# Patient Record
Sex: Female | Born: 1985 | Hispanic: No | Marital: Married | Smoking: Former smoker
Health system: Southern US, Community
[De-identification: ages and names within clinical notes are randomized; demographics above are authoritative.]

## PROBLEM LIST (undated history)

## (undated) DIAGNOSIS — R51 Headache: Secondary | ICD-10-CM

## (undated) DIAGNOSIS — F329 Major depressive disorder, single episode, unspecified: Secondary | ICD-10-CM

## (undated) DIAGNOSIS — F419 Anxiety disorder, unspecified: Secondary | ICD-10-CM

## (undated) DIAGNOSIS — F319 Bipolar disorder, unspecified: Secondary | ICD-10-CM

## (undated) DIAGNOSIS — F1910 Other psychoactive substance abuse, uncomplicated: Secondary | ICD-10-CM

## (undated) DIAGNOSIS — R519 Headache, unspecified: Secondary | ICD-10-CM

## (undated) DIAGNOSIS — Z9889 Other specified postprocedural states: Secondary | ICD-10-CM

## (undated) DIAGNOSIS — F1120 Opioid dependence, uncomplicated: Secondary | ICD-10-CM

## (undated) DIAGNOSIS — J189 Pneumonia, unspecified organism: Secondary | ICD-10-CM

## (undated) DIAGNOSIS — J45909 Unspecified asthma, uncomplicated: Secondary | ICD-10-CM

## (undated) DIAGNOSIS — R112 Nausea with vomiting, unspecified: Secondary | ICD-10-CM

## (undated) DIAGNOSIS — D649 Anemia, unspecified: Secondary | ICD-10-CM

## (undated) DIAGNOSIS — F32A Depression, unspecified: Secondary | ICD-10-CM

## (undated) DIAGNOSIS — Z8489 Family history of other specified conditions: Secondary | ICD-10-CM

## (undated) DIAGNOSIS — I10 Essential (primary) hypertension: Secondary | ICD-10-CM

## (undated) DIAGNOSIS — R87619 Unspecified abnormal cytological findings in specimens from cervix uteri: Secondary | ICD-10-CM

## (undated) DIAGNOSIS — IMO0002 Reserved for concepts with insufficient information to code with codable children: Secondary | ICD-10-CM

## (undated) DIAGNOSIS — A749 Chlamydial infection, unspecified: Secondary | ICD-10-CM

## (undated) HISTORY — DX: Chlamydial infection, unspecified: A74.9

## (undated) HISTORY — DX: Reserved for concepts with insufficient information to code with codable children: IMO0002

## (undated) HISTORY — DX: Headache, unspecified: R51.9

## (undated) HISTORY — DX: Major depressive disorder, single episode, unspecified: F32.9

## (undated) HISTORY — DX: Opioid dependence, uncomplicated: F11.20

## (undated) HISTORY — DX: Depression, unspecified: F32.A

## (undated) HISTORY — DX: Bipolar disorder, unspecified: F31.9

## (undated) HISTORY — PX: OTHER SURGICAL HISTORY: SHX169

## (undated) HISTORY — DX: Unspecified abnormal cytological findings in specimens from cervix uteri: R87.619

## (undated) HISTORY — DX: Headache: R51

---
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
## 2005-05-12 ENCOUNTER — Ambulatory Visit: Payer: Self-pay | Admitting: Family Medicine

## 2005-06-12 ENCOUNTER — Ambulatory Visit: Payer: Self-pay | Admitting: Family Medicine

## 2005-06-17 ENCOUNTER — Encounter (INDEPENDENT_AMBULATORY_CARE_PROVIDER_SITE_OTHER): Payer: Self-pay | Admitting: *Deleted

## 2005-06-17 LAB — CONVERTED CEMR LAB

## 2005-07-13 ENCOUNTER — Ambulatory Visit: Payer: Self-pay | Admitting: Family Medicine

## 2005-07-13 ENCOUNTER — Other Ambulatory Visit: Admission: RE | Admit: 2005-07-13 | Discharge: 2005-07-13 | Payer: Self-pay | Admitting: Family Medicine

## 2005-08-14 ENCOUNTER — Ambulatory Visit: Payer: Self-pay | Admitting: Sports Medicine

## 2005-09-07 ENCOUNTER — Ambulatory Visit: Payer: Self-pay | Admitting: Family Medicine

## 2005-10-31 ENCOUNTER — Ambulatory Visit: Payer: Self-pay | Admitting: Family Medicine

## 2005-11-23 ENCOUNTER — Emergency Department (HOSPITAL_COMMUNITY): Admission: EM | Admit: 2005-11-23 | Discharge: 2005-11-23 | Payer: Self-pay | Admitting: Family Medicine

## 2005-12-24 ENCOUNTER — Emergency Department (HOSPITAL_COMMUNITY): Admission: AD | Admit: 2005-12-24 | Discharge: 2005-12-24 | Payer: Self-pay | Admitting: Emergency Medicine

## 2005-12-28 ENCOUNTER — Ambulatory Visit: Payer: Self-pay | Admitting: Family Medicine

## 2006-01-09 ENCOUNTER — Other Ambulatory Visit: Admission: RE | Admit: 2006-01-09 | Discharge: 2006-01-09 | Payer: Self-pay | Admitting: Family Medicine

## 2006-01-09 ENCOUNTER — Ambulatory Visit: Payer: Self-pay | Admitting: Family Medicine

## 2006-01-09 ENCOUNTER — Encounter (INDEPENDENT_AMBULATORY_CARE_PROVIDER_SITE_OTHER): Payer: Self-pay | Admitting: Specialist

## 2006-01-10 ENCOUNTER — Ambulatory Visit (HOSPITAL_COMMUNITY): Admission: RE | Admit: 2006-01-10 | Discharge: 2006-01-10 | Payer: Self-pay | Admitting: Sports Medicine

## 2006-02-06 ENCOUNTER — Inpatient Hospital Stay (HOSPITAL_COMMUNITY): Admission: AD | Admit: 2006-02-06 | Discharge: 2006-02-07 | Payer: Self-pay | Admitting: Family Medicine

## 2006-02-13 ENCOUNTER — Ambulatory Visit: Payer: Self-pay | Admitting: Family Medicine

## 2006-03-16 ENCOUNTER — Ambulatory Visit: Payer: Self-pay | Admitting: Family Medicine

## 2006-03-29 ENCOUNTER — Ambulatory Visit (HOSPITAL_COMMUNITY): Admission: RE | Admit: 2006-03-29 | Discharge: 2006-03-29 | Payer: Self-pay | Admitting: Family Medicine

## 2006-04-11 ENCOUNTER — Ambulatory Visit: Payer: Self-pay | Admitting: Family Medicine

## 2006-05-17 ENCOUNTER — Ambulatory Visit: Payer: Self-pay | Admitting: Family Medicine

## 2006-06-21 ENCOUNTER — Emergency Department (HOSPITAL_COMMUNITY): Admission: EM | Admit: 2006-06-21 | Discharge: 2006-06-21 | Payer: Self-pay | Admitting: Emergency Medicine

## 2006-06-22 ENCOUNTER — Ambulatory Visit: Payer: Self-pay | Admitting: Family Medicine

## 2006-06-23 ENCOUNTER — Emergency Department (HOSPITAL_COMMUNITY): Admission: EM | Admit: 2006-06-23 | Discharge: 2006-06-23 | Payer: Self-pay | Admitting: Emergency Medicine

## 2006-07-12 ENCOUNTER — Ambulatory Visit: Payer: Self-pay | Admitting: Sports Medicine

## 2006-07-23 ENCOUNTER — Ambulatory Visit: Payer: Self-pay | Admitting: Family Medicine

## 2006-07-30 ENCOUNTER — Ambulatory Visit: Payer: Self-pay | Admitting: Family Medicine

## 2006-08-09 ENCOUNTER — Ambulatory Visit: Payer: Self-pay | Admitting: Family Medicine

## 2006-08-10 ENCOUNTER — Ambulatory Visit: Payer: Self-pay | Admitting: Obstetrics and Gynecology

## 2006-08-10 ENCOUNTER — Inpatient Hospital Stay (HOSPITAL_COMMUNITY): Admission: AD | Admit: 2006-08-10 | Discharge: 2006-08-12 | Payer: Self-pay | Admitting: *Deleted

## 2006-09-12 ENCOUNTER — Ambulatory Visit: Payer: Self-pay | Admitting: Family Medicine

## 2006-10-03 ENCOUNTER — Ambulatory Visit: Payer: Self-pay | Admitting: Family Medicine

## 2006-10-19 ENCOUNTER — Encounter (INDEPENDENT_AMBULATORY_CARE_PROVIDER_SITE_OTHER): Payer: Self-pay | Admitting: Specialist

## 2006-10-19 ENCOUNTER — Other Ambulatory Visit: Admission: RE | Admit: 2006-10-19 | Discharge: 2006-10-19 | Payer: Self-pay | Admitting: Family Medicine

## 2006-10-19 ENCOUNTER — Ambulatory Visit: Payer: Self-pay | Admitting: Family Medicine

## 2006-12-20 ENCOUNTER — Emergency Department (HOSPITAL_COMMUNITY): Admission: EM | Admit: 2006-12-20 | Discharge: 2006-12-20 | Payer: Self-pay | Admitting: Emergency Medicine

## 2007-01-30 ENCOUNTER — Emergency Department (HOSPITAL_COMMUNITY): Admission: EM | Admit: 2007-01-30 | Discharge: 2007-01-30 | Payer: Self-pay | Admitting: Emergency Medicine

## 2007-02-11 ENCOUNTER — Encounter (INDEPENDENT_AMBULATORY_CARE_PROVIDER_SITE_OTHER): Payer: Self-pay | Admitting: Family Medicine

## 2007-02-11 ENCOUNTER — Ambulatory Visit: Payer: Self-pay | Admitting: Family Medicine

## 2007-02-11 LAB — CONVERTED CEMR LAB
Chlamydia, DNA Probe: NEGATIVE
GC Probe Amp, Genital: NEGATIVE

## 2007-02-14 DIAGNOSIS — F17200 Nicotine dependence, unspecified, uncomplicated: Secondary | ICD-10-CM

## 2007-02-14 DIAGNOSIS — J309 Allergic rhinitis, unspecified: Secondary | ICD-10-CM | POA: Insufficient documentation

## 2007-02-14 DIAGNOSIS — L2089 Other atopic dermatitis: Secondary | ICD-10-CM

## 2007-02-14 DIAGNOSIS — Z87891 Personal history of nicotine dependence: Secondary | ICD-10-CM | POA: Insufficient documentation

## 2007-02-14 DIAGNOSIS — R8789 Other abnormal findings in specimens from female genital organs: Secondary | ICD-10-CM

## 2007-02-14 DIAGNOSIS — G43909 Migraine, unspecified, not intractable, without status migrainosus: Secondary | ICD-10-CM | POA: Insufficient documentation

## 2007-02-15 ENCOUNTER — Encounter (INDEPENDENT_AMBULATORY_CARE_PROVIDER_SITE_OTHER): Payer: Self-pay | Admitting: *Deleted

## 2007-03-24 ENCOUNTER — Emergency Department (HOSPITAL_COMMUNITY): Admission: EM | Admit: 2007-03-24 | Discharge: 2007-03-24 | Payer: Self-pay | Admitting: Emergency Medicine

## 2007-06-13 ENCOUNTER — Telehealth: Payer: Self-pay | Admitting: *Deleted

## 2007-06-13 ENCOUNTER — Ambulatory Visit: Payer: Self-pay | Admitting: Sports Medicine

## 2007-06-13 LAB — CONVERTED CEMR LAB
Nitrite: NEGATIVE
Urobilinogen, UA: 4
pH: 7

## 2007-06-19 ENCOUNTER — Encounter: Payer: Self-pay | Admitting: *Deleted

## 2007-06-19 ENCOUNTER — Emergency Department (HOSPITAL_COMMUNITY): Admission: EM | Admit: 2007-06-19 | Discharge: 2007-06-19 | Payer: Self-pay | Admitting: Family Medicine

## 2007-08-12 ENCOUNTER — Telehealth: Payer: Self-pay | Admitting: *Deleted

## 2007-08-16 ENCOUNTER — Encounter: Payer: Self-pay | Admitting: *Deleted

## 2007-09-18 LAB — CONVERTED CEMR LAB: Pap Smear: NORMAL

## 2007-10-06 ENCOUNTER — Telehealth (INDEPENDENT_AMBULATORY_CARE_PROVIDER_SITE_OTHER): Payer: Self-pay | Admitting: Family Medicine

## 2007-10-10 ENCOUNTER — Emergency Department (HOSPITAL_COMMUNITY): Admission: EM | Admit: 2007-10-10 | Discharge: 2007-10-10 | Payer: Self-pay | Admitting: Emergency Medicine

## 2007-10-11 ENCOUNTER — Telehealth: Payer: Self-pay | Admitting: *Deleted

## 2007-10-15 ENCOUNTER — Telehealth (INDEPENDENT_AMBULATORY_CARE_PROVIDER_SITE_OTHER): Payer: Self-pay | Admitting: Family Medicine

## 2007-10-15 ENCOUNTER — Other Ambulatory Visit: Admission: RE | Admit: 2007-10-15 | Discharge: 2007-10-15 | Payer: Self-pay | Admitting: Family Medicine

## 2007-10-15 ENCOUNTER — Encounter (INDEPENDENT_AMBULATORY_CARE_PROVIDER_SITE_OTHER): Payer: Self-pay | Admitting: Family Medicine

## 2007-10-15 ENCOUNTER — Ambulatory Visit: Payer: Self-pay | Admitting: Family Medicine

## 2007-10-15 LAB — CONVERTED CEMR LAB: Whiff Test: NEGATIVE

## 2007-10-16 LAB — CONVERTED CEMR LAB: GC Probe Amp, Genital: NEGATIVE

## 2007-10-18 ENCOUNTER — Encounter (INDEPENDENT_AMBULATORY_CARE_PROVIDER_SITE_OTHER): Payer: Self-pay | Admitting: Family Medicine

## 2007-10-27 ENCOUNTER — Emergency Department (HOSPITAL_COMMUNITY): Admission: EM | Admit: 2007-10-27 | Discharge: 2007-10-28 | Payer: Self-pay | Admitting: Emergency Medicine

## 2007-10-30 ENCOUNTER — Ambulatory Visit: Payer: Self-pay | Admitting: Family Medicine

## 2007-10-30 LAB — CONVERTED CEMR LAB
Glucose, Urine, Semiquant: NEGATIVE
pH: 7

## 2007-12-01 ENCOUNTER — Telehealth (INDEPENDENT_AMBULATORY_CARE_PROVIDER_SITE_OTHER): Payer: Self-pay | Admitting: Family Medicine

## 2007-12-02 ENCOUNTER — Telehealth: Payer: Self-pay | Admitting: *Deleted

## 2007-12-02 ENCOUNTER — Encounter: Payer: Self-pay | Admitting: *Deleted

## 2007-12-30 ENCOUNTER — Emergency Department (HOSPITAL_COMMUNITY): Admission: EM | Admit: 2007-12-30 | Discharge: 2007-12-30 | Payer: Self-pay | Admitting: Emergency Medicine

## 2008-02-05 ENCOUNTER — Encounter (INDEPENDENT_AMBULATORY_CARE_PROVIDER_SITE_OTHER): Payer: Self-pay | Admitting: Family Medicine

## 2008-02-05 ENCOUNTER — Ambulatory Visit: Payer: Self-pay | Admitting: Family Medicine

## 2008-02-05 DIAGNOSIS — F319 Bipolar disorder, unspecified: Secondary | ICD-10-CM

## 2008-02-05 LAB — CONVERTED CEMR LAB
BUN: 11 mg/dL (ref 6–23)
Basophils Relative: 0 % (ref 0–1)
Calcium: 9.6 mg/dL (ref 8.4–10.5)
Eosinophils Absolute: 0.3 10*3/uL (ref 0.0–0.7)
Eosinophils Relative: 4 % (ref 0–5)
Glucose, Bld: 91 mg/dL (ref 70–99)
Hemoglobin: 12.3 g/dL (ref 12.0–15.0)
MCHC: 31.8 g/dL (ref 30.0–36.0)
MCV: 87.6 fL (ref 78.0–100.0)
Monocytes Absolute: 0.4 10*3/uL (ref 0.1–1.0)
Monocytes Relative: 6 % (ref 3–12)
Neutrophils Relative %: 58 % (ref 43–77)
RBC: 4.42 M/uL (ref 3.87–5.11)
TSH: 0.459 microintl units/mL (ref 0.350–5.50)

## 2008-03-08 ENCOUNTER — Emergency Department (HOSPITAL_COMMUNITY): Admission: EM | Admit: 2008-03-08 | Discharge: 2008-03-08 | Payer: Self-pay | Admitting: Emergency Medicine

## 2008-03-26 ENCOUNTER — Emergency Department (HOSPITAL_COMMUNITY): Admission: EM | Admit: 2008-03-26 | Discharge: 2008-03-26 | Payer: Self-pay | Admitting: Emergency Medicine

## 2008-04-02 ENCOUNTER — Telehealth: Payer: Self-pay | Admitting: *Deleted

## 2008-04-17 ENCOUNTER — Ambulatory Visit: Payer: Self-pay | Admitting: Family Medicine

## 2008-04-17 ENCOUNTER — Encounter (INDEPENDENT_AMBULATORY_CARE_PROVIDER_SITE_OTHER): Payer: Self-pay | Admitting: Family Medicine

## 2008-04-17 DIAGNOSIS — N643 Galactorrhea not associated with childbirth: Secondary | ICD-10-CM

## 2008-07-30 ENCOUNTER — Telehealth: Payer: Self-pay | Admitting: *Deleted

## 2008-07-30 ENCOUNTER — Ambulatory Visit: Payer: Self-pay | Admitting: Family Medicine

## 2008-09-11 ENCOUNTER — Emergency Department (HOSPITAL_COMMUNITY): Admission: EM | Admit: 2008-09-11 | Discharge: 2008-09-11 | Payer: Self-pay | Admitting: Emergency Medicine

## 2008-09-30 ENCOUNTER — Ambulatory Visit: Payer: Self-pay

## 2008-09-30 ENCOUNTER — Encounter (INDEPENDENT_AMBULATORY_CARE_PROVIDER_SITE_OTHER): Payer: Self-pay | Admitting: Family Medicine

## 2008-09-30 DIAGNOSIS — N739 Female pelvic inflammatory disease, unspecified: Secondary | ICD-10-CM | POA: Insufficient documentation

## 2008-09-30 LAB — CONVERTED CEMR LAB

## 2008-10-01 ENCOUNTER — Encounter (INDEPENDENT_AMBULATORY_CARE_PROVIDER_SITE_OTHER): Payer: Self-pay | Admitting: Family Medicine

## 2008-10-05 ENCOUNTER — Telehealth: Payer: Self-pay | Admitting: *Deleted

## 2008-10-15 ENCOUNTER — Telehealth: Payer: Self-pay | Admitting: Family Medicine

## 2008-11-03 ENCOUNTER — Telehealth (INDEPENDENT_AMBULATORY_CARE_PROVIDER_SITE_OTHER): Payer: Self-pay | Admitting: *Deleted

## 2008-11-09 ENCOUNTER — Telehealth: Payer: Self-pay | Admitting: *Deleted

## 2008-11-09 ENCOUNTER — Encounter (INDEPENDENT_AMBULATORY_CARE_PROVIDER_SITE_OTHER): Payer: Self-pay | Admitting: Family Medicine

## 2008-11-10 ENCOUNTER — Inpatient Hospital Stay (HOSPITAL_COMMUNITY): Admission: AD | Admit: 2008-11-10 | Discharge: 2008-11-10 | Payer: Self-pay | Admitting: Obstetrics & Gynecology

## 2008-11-13 ENCOUNTER — Inpatient Hospital Stay (HOSPITAL_COMMUNITY): Admission: AD | Admit: 2008-11-13 | Discharge: 2008-11-13 | Payer: Self-pay | Admitting: Obstetrics & Gynecology

## 2008-11-16 ENCOUNTER — Emergency Department (HOSPITAL_COMMUNITY): Admission: EM | Admit: 2008-11-16 | Discharge: 2008-11-16 | Payer: Self-pay | Admitting: Emergency Medicine

## 2008-11-20 ENCOUNTER — Inpatient Hospital Stay (HOSPITAL_COMMUNITY): Admission: RE | Admit: 2008-11-20 | Discharge: 2008-11-20 | Payer: Self-pay | Admitting: Obstetrics & Gynecology

## 2008-12-01 ENCOUNTER — Encounter (INDEPENDENT_AMBULATORY_CARE_PROVIDER_SITE_OTHER): Payer: Self-pay | Admitting: Family Medicine

## 2008-12-08 ENCOUNTER — Encounter (INDEPENDENT_AMBULATORY_CARE_PROVIDER_SITE_OTHER): Payer: Self-pay | Admitting: Family Medicine

## 2008-12-08 ENCOUNTER — Ambulatory Visit: Payer: Self-pay | Admitting: Family Medicine

## 2008-12-08 ENCOUNTER — Other Ambulatory Visit: Admission: RE | Admit: 2008-12-08 | Discharge: 2008-12-08 | Payer: Self-pay | Admitting: Family Medicine

## 2008-12-08 LAB — CONVERTED CEMR LAB
Antibody Screen: NEGATIVE
Basophils Relative: 0 % (ref 0–1)
Hepatitis B Surface Ag: NEGATIVE
Lymphs Abs: 1.7 10*3/uL (ref 0.7–4.0)
MCHC: 31.6 g/dL (ref 30.0–36.0)
Monocytes Relative: 7 % (ref 3–12)
Neutro Abs: 3.4 10*3/uL (ref 1.7–7.7)
Neutrophils Relative %: 60 % (ref 43–77)
RBC: 4.47 M/uL (ref 3.87–5.11)
Rubella: 19.4 intl units/mL — ABNORMAL HIGH
WBC: 5.7 10*3/uL (ref 4.0–10.5)

## 2008-12-09 ENCOUNTER — Encounter (INDEPENDENT_AMBULATORY_CARE_PROVIDER_SITE_OTHER): Payer: Self-pay | Admitting: Family Medicine

## 2008-12-09 ENCOUNTER — Ambulatory Visit (HOSPITAL_COMMUNITY): Admission: RE | Admit: 2008-12-09 | Discharge: 2008-12-09 | Payer: Self-pay | Admitting: Family Medicine

## 2008-12-15 ENCOUNTER — Ambulatory Visit: Payer: Self-pay | Admitting: Family Medicine

## 2008-12-31 ENCOUNTER — Ambulatory Visit: Payer: Self-pay | Admitting: Family Medicine

## 2009-01-21 ENCOUNTER — Ambulatory Visit: Payer: Self-pay | Admitting: Family Medicine

## 2009-01-31 ENCOUNTER — Ambulatory Visit: Payer: Self-pay | Admitting: Advanced Practice Midwife

## 2009-01-31 ENCOUNTER — Inpatient Hospital Stay (HOSPITAL_COMMUNITY): Admission: AD | Admit: 2009-01-31 | Discharge: 2009-02-01 | Payer: Self-pay | Admitting: Obstetrics & Gynecology

## 2009-02-18 ENCOUNTER — Ambulatory Visit (HOSPITAL_COMMUNITY): Admission: RE | Admit: 2009-02-18 | Discharge: 2009-02-18 | Payer: Self-pay | Admitting: Obstetrics and Gynecology

## 2009-02-18 ENCOUNTER — Ambulatory Visit: Payer: Self-pay | Admitting: Family Medicine

## 2009-03-04 ENCOUNTER — Ambulatory Visit: Payer: Self-pay | Admitting: Obstetrics & Gynecology

## 2009-03-15 ENCOUNTER — Ambulatory Visit: Payer: Self-pay | Admitting: Obstetrics & Gynecology

## 2009-03-19 ENCOUNTER — Ambulatory Visit (HOSPITAL_COMMUNITY): Admission: RE | Admit: 2009-03-19 | Discharge: 2009-03-19 | Payer: Self-pay | Admitting: Family Medicine

## 2009-03-27 ENCOUNTER — Emergency Department (HOSPITAL_COMMUNITY): Admission: EM | Admit: 2009-03-27 | Discharge: 2009-03-27 | Payer: Self-pay | Admitting: Emergency Medicine

## 2009-03-29 ENCOUNTER — Ambulatory Visit: Payer: Self-pay | Admitting: Family Medicine

## 2009-04-06 ENCOUNTER — Inpatient Hospital Stay (HOSPITAL_COMMUNITY): Admission: AD | Admit: 2009-04-06 | Discharge: 2009-04-06 | Payer: Self-pay | Admitting: Obstetrics & Gynecology

## 2009-04-19 ENCOUNTER — Ambulatory Visit (HOSPITAL_COMMUNITY): Admission: RE | Admit: 2009-04-19 | Discharge: 2009-04-19 | Payer: Self-pay | Admitting: Obstetrics & Gynecology

## 2009-04-19 ENCOUNTER — Ambulatory Visit: Payer: Self-pay | Admitting: Family Medicine

## 2009-05-06 ENCOUNTER — Ambulatory Visit: Payer: Self-pay | Admitting: Obstetrics & Gynecology

## 2009-05-06 LAB — CONVERTED CEMR LAB
HCT: 33 % — ABNORMAL LOW (ref 36.0–46.0)
Hemoglobin: 10.7 g/dL — ABNORMAL LOW (ref 12.0–15.0)
RBC: 3.73 M/uL — ABNORMAL LOW (ref 3.87–5.11)
RDW: 13 % (ref 11.5–15.5)

## 2009-05-13 ENCOUNTER — Inpatient Hospital Stay (HOSPITAL_COMMUNITY): Admission: AD | Admit: 2009-05-13 | Discharge: 2009-05-13 | Payer: Self-pay | Admitting: Obstetrics & Gynecology

## 2009-05-13 ENCOUNTER — Ambulatory Visit: Payer: Self-pay | Admitting: Family

## 2009-05-19 ENCOUNTER — Ambulatory Visit (HOSPITAL_COMMUNITY): Admission: RE | Admit: 2009-05-19 | Discharge: 2009-05-19 | Payer: Self-pay | Admitting: Obstetrics & Gynecology

## 2009-05-24 ENCOUNTER — Ambulatory Visit: Payer: Self-pay | Admitting: Obstetrics & Gynecology

## 2009-05-24 ENCOUNTER — Inpatient Hospital Stay (HOSPITAL_COMMUNITY): Admission: AD | Admit: 2009-05-24 | Discharge: 2009-05-27 | Payer: Self-pay | Admitting: Obstetrics & Gynecology

## 2009-05-24 LAB — CONVERTED CEMR LAB: Chlamydia, DNA Probe: NEGATIVE

## 2009-05-25 ENCOUNTER — Encounter: Payer: Self-pay | Admitting: Obstetrics & Gynecology

## 2009-05-25 LAB — CONVERTED CEMR LAB
Clue Cells Wet Prep HPF POC: NONE SEEN
Trich, Wet Prep: NONE SEEN
Yeast Wet Prep HPF POC: NONE SEEN

## 2009-05-31 ENCOUNTER — Encounter: Payer: Self-pay | Admitting: Family Medicine

## 2009-05-31 ENCOUNTER — Ambulatory Visit: Payer: Self-pay | Admitting: Obstetrics & Gynecology

## 2009-05-31 LAB — CONVERTED CEMR LAB: Trich, Wet Prep: NONE SEEN

## 2009-06-01 ENCOUNTER — Ambulatory Visit (HOSPITAL_COMMUNITY): Admission: RE | Admit: 2009-06-01 | Discharge: 2009-06-01 | Payer: Self-pay | Admitting: Family Medicine

## 2009-06-03 ENCOUNTER — Ambulatory Visit (HOSPITAL_COMMUNITY): Admission: RE | Admit: 2009-06-03 | Discharge: 2009-06-03 | Payer: Self-pay | Admitting: Obstetrics & Gynecology

## 2009-06-07 ENCOUNTER — Ambulatory Visit: Payer: Self-pay | Admitting: Family Medicine

## 2009-06-10 ENCOUNTER — Inpatient Hospital Stay (HOSPITAL_COMMUNITY): Admission: AD | Admit: 2009-06-10 | Discharge: 2009-06-12 | Payer: Self-pay | Admitting: Obstetrics & Gynecology

## 2009-06-10 ENCOUNTER — Encounter: Payer: Self-pay | Admitting: *Deleted

## 2009-06-10 ENCOUNTER — Ambulatory Visit: Payer: Self-pay | Admitting: Obstetrics & Gynecology

## 2009-06-12 ENCOUNTER — Encounter: Payer: Self-pay | Admitting: Family Medicine

## 2009-06-13 IMAGING — US US OB TRANSVAGINAL
1 series · 14 of 28 positions shown · non-contrast
Comparison: none

OBSTETRICAL ULTRASOUND:
 This ultrasound exam was performed in the [HOSPITAL] Ultrasound Department.  The OB US report was generated in the AS system, and faxed to the ordering physician.  This report is also available in [REDACTED] PACS.

[Series 1: us ob transvaginal · 14 of 32 slices shown]
[im 2/32]
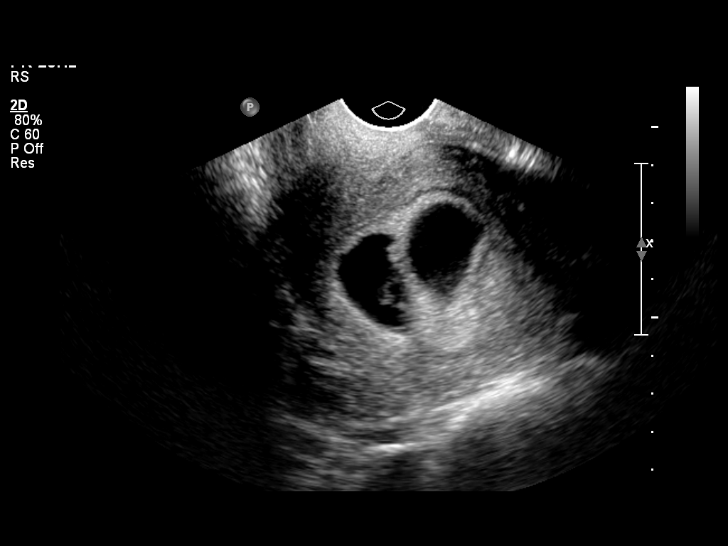
[im 4/32]
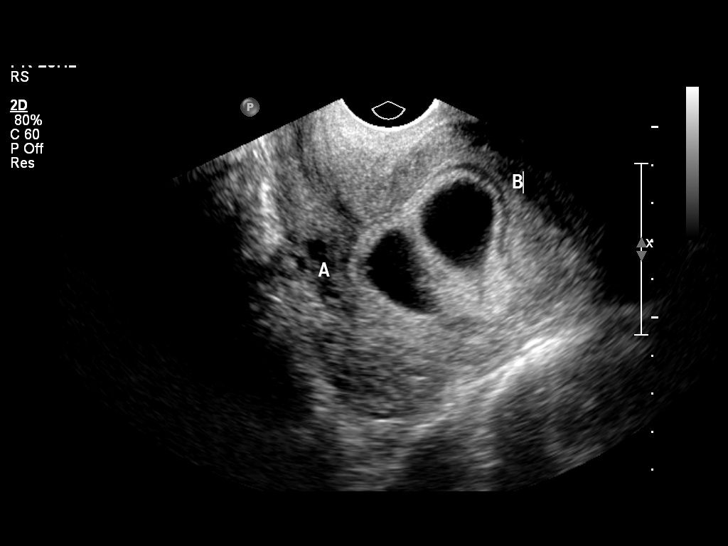
[im 6/32]
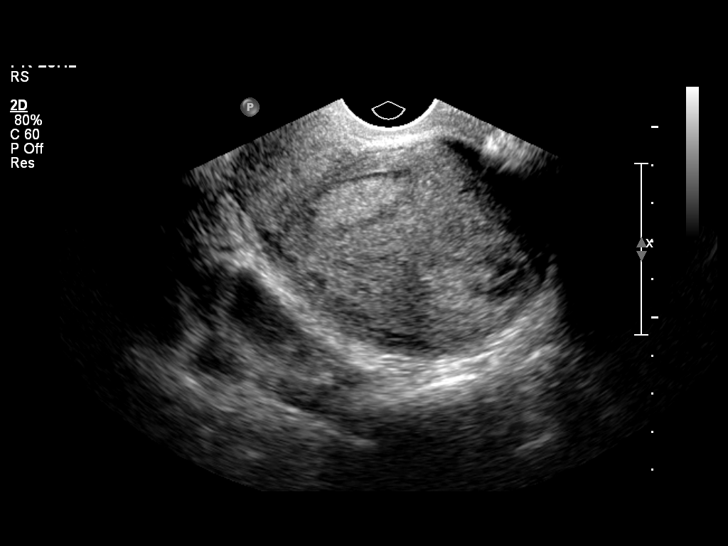
[im 9/32]
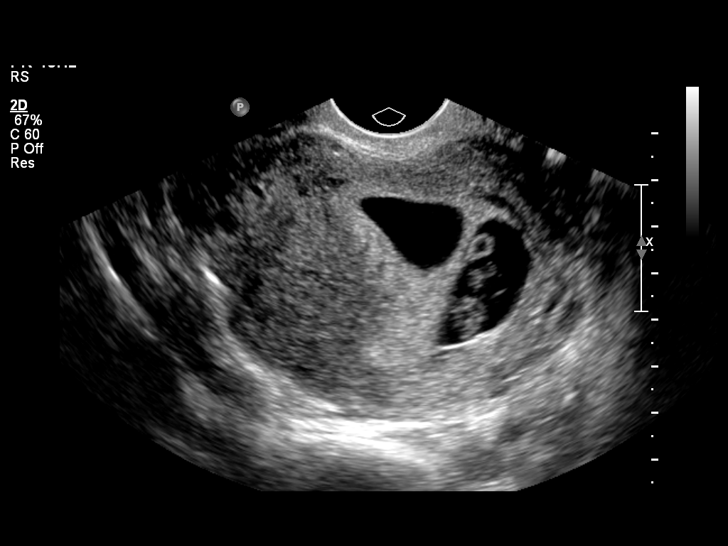
[im 11/32]
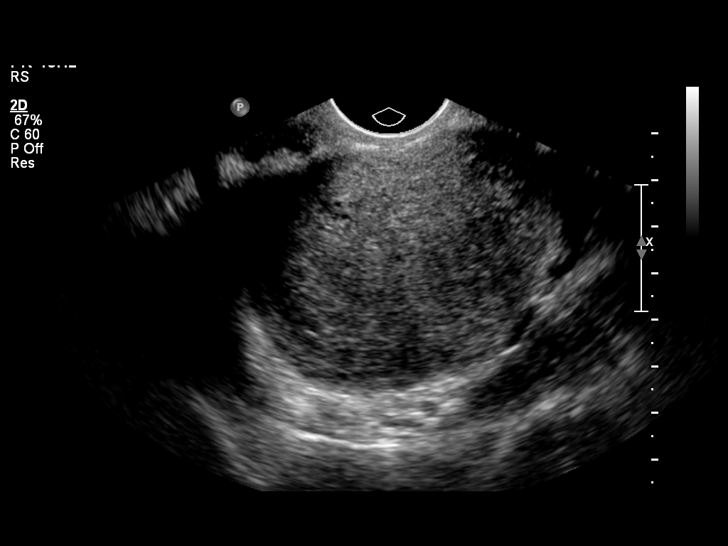
[im 13/32]
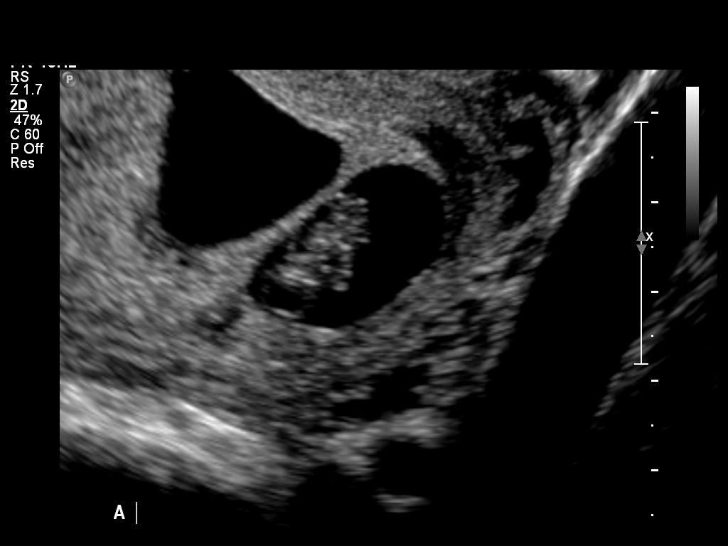
[im 15/32]
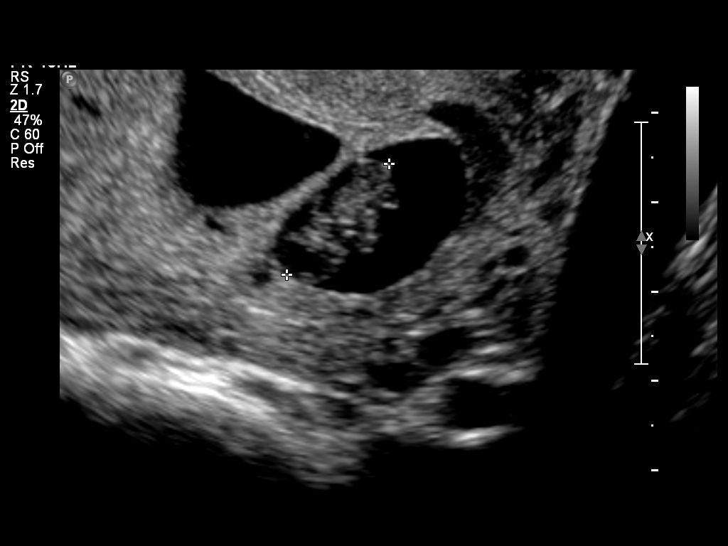
[im 18/32]
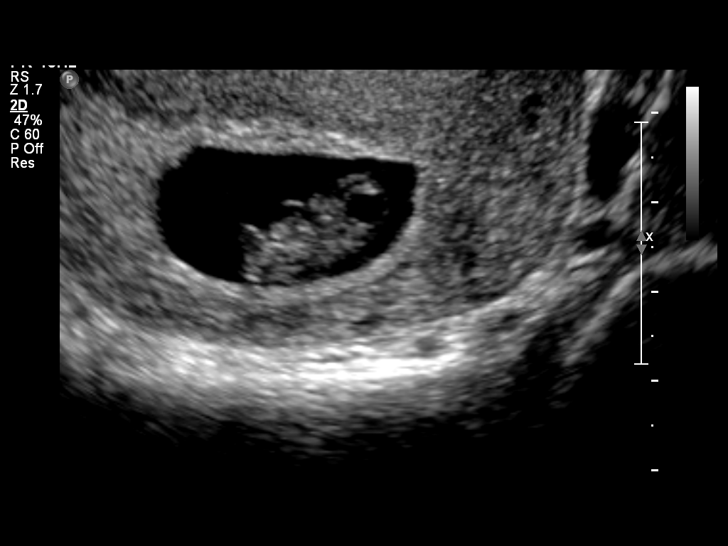
[im 20/32]
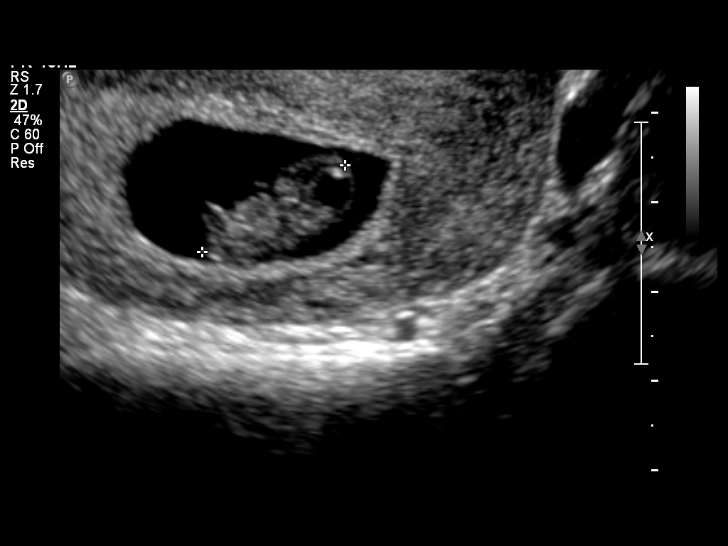
[im 22/32]
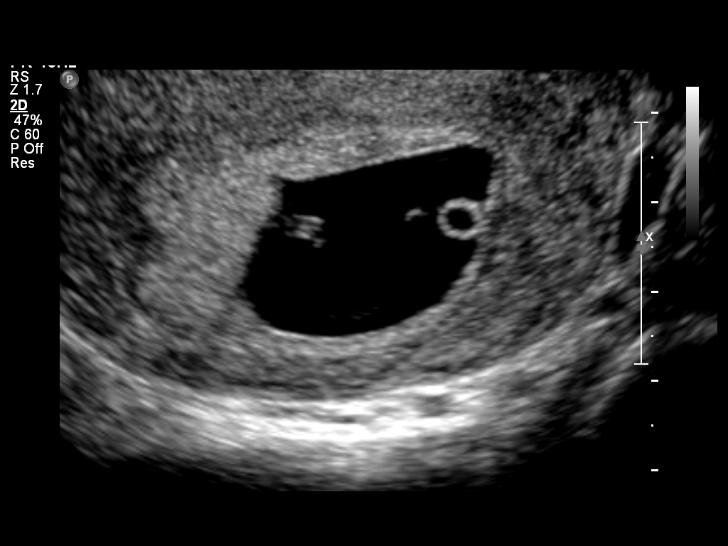
[im 25/32]
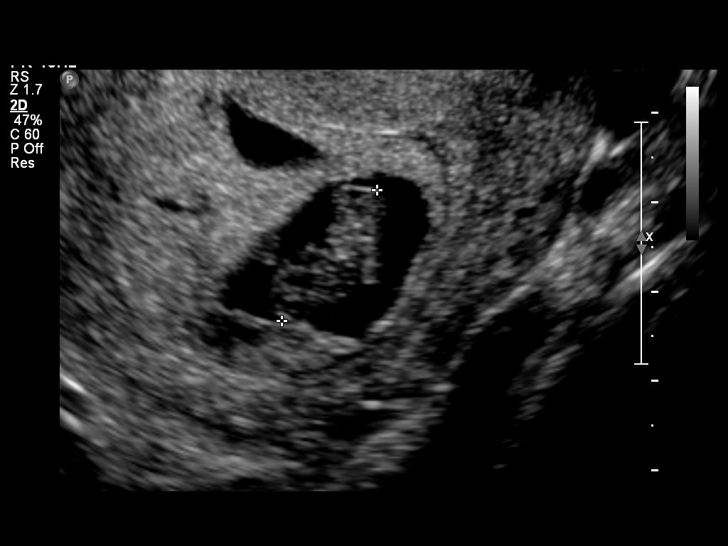
[im 27/32]
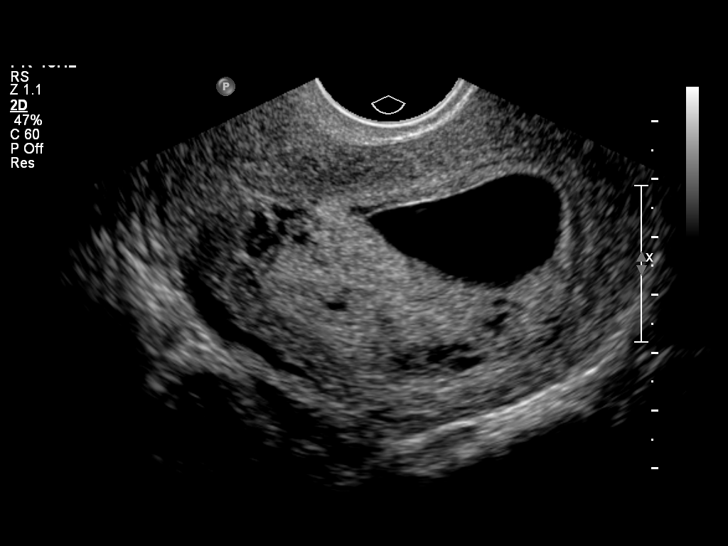
[im 29/32]
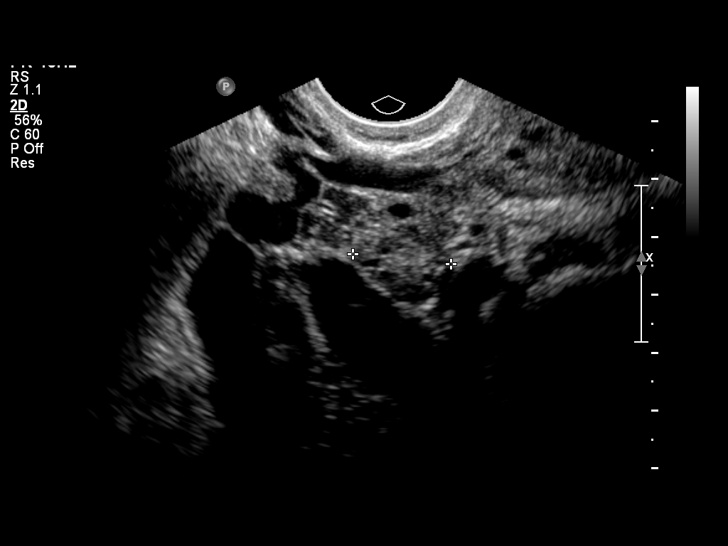
[im 32/32]
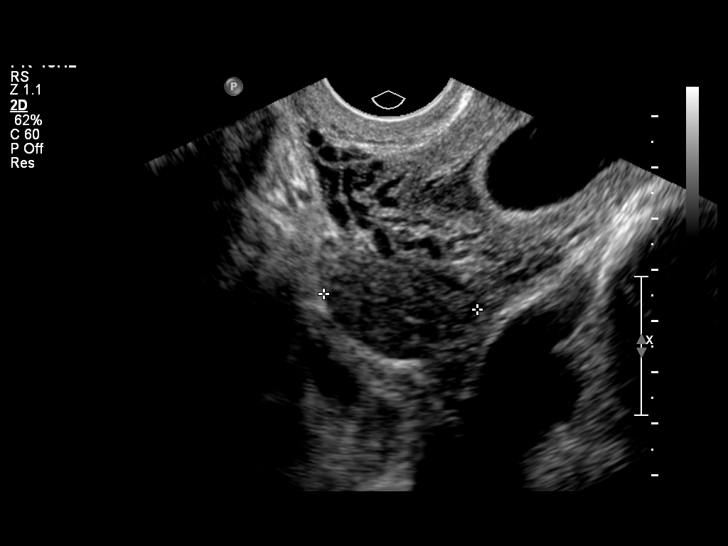

[14 of 28 positions shown; findings below may reference images not displayed]

IMPRESSION: See AS Obstetric US report.

## 2009-06-14 ENCOUNTER — Telehealth: Payer: Self-pay | Admitting: Family Medicine

## 2009-06-22 ENCOUNTER — Telehealth: Payer: Self-pay | Admitting: Family Medicine

## 2009-06-22 ENCOUNTER — Ambulatory Visit: Payer: Self-pay | Admitting: Family Medicine

## 2009-06-22 LAB — CONVERTED CEMR LAB
Bilirubin Urine: NEGATIVE
Glucose, Urine, Semiquant: NEGATIVE
Ketones, urine, test strip: NEGATIVE
Protein, U semiquant: NEGATIVE
Urobilinogen, UA: 0.2
Whiff Test: NEGATIVE
pH: 7

## 2009-07-01 ENCOUNTER — Encounter: Payer: Self-pay | Admitting: Family Medicine

## 2009-07-02 ENCOUNTER — Encounter: Payer: Self-pay | Admitting: Family Medicine

## 2009-07-02 ENCOUNTER — Ambulatory Visit: Payer: Self-pay | Admitting: Family Medicine

## 2009-07-02 LAB — CONVERTED CEMR LAB: GC Probe Amp, Genital: NEGATIVE

## 2009-07-07 ENCOUNTER — Telehealth: Payer: Self-pay | Admitting: Family Medicine

## 2009-07-28 ENCOUNTER — Ambulatory Visit: Payer: Self-pay | Admitting: Family Medicine

## 2009-07-28 DIAGNOSIS — M549 Dorsalgia, unspecified: Secondary | ICD-10-CM | POA: Insufficient documentation

## 2009-08-17 ENCOUNTER — Telehealth (INDEPENDENT_AMBULATORY_CARE_PROVIDER_SITE_OTHER): Payer: Self-pay | Admitting: *Deleted

## 2009-08-22 ENCOUNTER — Telehealth: Payer: Self-pay | Admitting: Family Medicine

## 2009-09-03 ENCOUNTER — Ambulatory Visit: Payer: Self-pay | Admitting: Family Medicine

## 2009-09-29 ENCOUNTER — Emergency Department (HOSPITAL_COMMUNITY): Admission: EM | Admit: 2009-09-29 | Discharge: 2009-09-29 | Payer: Self-pay | Admitting: Emergency Medicine

## 2009-10-04 ENCOUNTER — Ambulatory Visit: Payer: Self-pay | Admitting: Family Medicine

## 2009-10-04 ENCOUNTER — Telehealth: Payer: Self-pay | Admitting: Family Medicine

## 2009-10-08 ENCOUNTER — Ambulatory Visit: Payer: Self-pay | Admitting: Family Medicine

## 2009-10-08 DIAGNOSIS — M273 Alveolitis of jaws: Secondary | ICD-10-CM | POA: Insufficient documentation

## 2009-12-01 ENCOUNTER — Ambulatory Visit: Payer: Self-pay | Admitting: Family Medicine

## 2010-01-31 ENCOUNTER — Telehealth: Payer: Self-pay | Admitting: Family Medicine

## 2010-02-02 ENCOUNTER — Ambulatory Visit: Payer: Self-pay | Admitting: Family Medicine

## 2010-03-23 ENCOUNTER — Telehealth: Payer: Self-pay | Admitting: Family Medicine

## 2010-03-29 ENCOUNTER — Telehealth: Payer: Self-pay | Admitting: Family Medicine

## 2010-04-25 ENCOUNTER — Emergency Department (HOSPITAL_COMMUNITY): Admission: EM | Admit: 2010-04-25 | Discharge: 2010-04-25 | Payer: Self-pay | Admitting: Emergency Medicine

## 2010-05-31 ENCOUNTER — Emergency Department (HOSPITAL_COMMUNITY): Admission: EM | Admit: 2010-05-31 | Discharge: 2010-05-31 | Payer: Self-pay | Admitting: Emergency Medicine

## 2010-09-07 ENCOUNTER — Telehealth: Payer: Self-pay | Admitting: Family Medicine

## 2010-09-15 ENCOUNTER — Encounter: Payer: Self-pay | Admitting: Family Medicine

## 2010-09-27 ENCOUNTER — Ambulatory Visit: Payer: Self-pay | Admitting: Family Medicine

## 2010-09-27 ENCOUNTER — Encounter: Payer: Self-pay | Admitting: Family Medicine

## 2010-09-27 DIAGNOSIS — A64 Unspecified sexually transmitted disease: Secondary | ICD-10-CM | POA: Insufficient documentation

## 2010-09-27 LAB — CONVERTED CEMR LAB: Beta hcg, urine, semiquantitative: NEGATIVE

## 2010-09-28 ENCOUNTER — Ambulatory Visit: Payer: Self-pay | Admitting: Family Medicine

## 2010-09-28 LAB — CONVERTED CEMR LAB
Chlamydia, DNA Probe: POSITIVE — AB
GC Probe Amp, Genital: NEGATIVE

## 2010-12-01 ENCOUNTER — Ambulatory Visit: Payer: Self-pay | Admitting: Family Medicine

## 2010-12-01 ENCOUNTER — Encounter: Payer: Self-pay | Admitting: Family Medicine

## 2010-12-01 ENCOUNTER — Telehealth: Payer: Self-pay | Admitting: Family Medicine

## 2010-12-01 ENCOUNTER — Ambulatory Visit: Payer: Self-pay

## 2010-12-01 LAB — CONVERTED CEMR LAB
Beta hcg, urine, semiquantitative: NEGATIVE
Blood in Urine, dipstick: NEGATIVE
Chlamydia, DNA Probe: NEGATIVE
Glucose, Urine, Semiquant: NEGATIVE
Ketones, urine, test strip: NEGATIVE
Nitrite: POSITIVE
Protein, U semiquant: NEGATIVE
Specific Gravity, Urine: >=1.03
pH: 6.5

## 2010-12-02 ENCOUNTER — Encounter: Payer: Self-pay | Admitting: Family Medicine

## 2010-12-15 ENCOUNTER — Ambulatory Visit: Payer: Self-pay

## 2010-12-18 ENCOUNTER — Telehealth: Payer: Self-pay | Admitting: Family Medicine

## 2011-01-08 ENCOUNTER — Encounter: Payer: Self-pay | Admitting: *Deleted

## 2011-01-08 ENCOUNTER — Encounter: Payer: Self-pay | Admitting: Obstetrics & Gynecology

## 2011-01-09 ENCOUNTER — Encounter: Payer: Self-pay | Admitting: *Deleted

## 2011-01-19 NOTE — Miscellaneous (Signed)
  Clinical Lists Changes  Problems: Removed problem of ACUTE CYSTITIS (ICD-595.0) Removed problem of DYSURIA (ICD-788.1) Removed problem of VAGINITIS (ICD-616.10) Removed problem of CHLAMYDIAL INFECTION (ICD-099.41) Removed problem of TWIN GESTATION (ICD-651.00) Removed problem of CONTACT OR EXPOSURE TO OTHER VIRAL DISEASES (ICD-V01.79) Removed problem of SEXUALLY TRANSMITTED DISEASE, EXPOSURE TO (ICD-V01.6)

## 2011-01-19 NOTE — Miscellaneous (Signed)
   Clinical Lists Changes  Problems: Removed problem of ASTHMA, UNSPECIFIED (ICD-493.90) 

## 2011-01-19 NOTE — Progress Notes (Signed)
Summary: Rx Req  Phone Note Refill Request Call back at [phone] Message from:  Patient  Refills Requested: Medication #1:  NUTRINATE   CHEW 1 tablet a day [facility] [address]  Initial call taken by: Clydell Hakim,  September 07, 2010 1:32 PM    Prescriptions: NUTRINATE   CHEW (PRENATAL VIT-FE FUMARATE-FA) 1 tablet a day  #30 x 11   Entered and Authorized by:   Clementeen Graham MD   Signed by:   Clementeen Graham MD on 09/12/2010   Method used:   [facility]        [facility]. [phone]* (retail)       [address], [place]  [address]       Ph: [phone] or [phone]       Fax: [phone]   RxID:   [id number]   Appended Document: Rx Req called pt lmvm to return call. [facility].

## 2011-01-19 NOTE — Progress Notes (Signed)
Summary: triage  Phone Note Call from Patient Call back at [phone]   Caller: Patient Summary of Call: Would like to get the morning after pill. Initial call taken by: Clydell Hakim,  March 23, 2010 8:35 AM  Follow-up for Phone Call        [facility] [address] unable to afford the plan B. asked that md send rx to her pharmacy. told her her pcp does not rx this. I will take this to another md & call her when done. had sex at 3am this am. ran out of condoms. advised her to stop by for a supply  Follow-up by: Golden Circle RN,  March 23, 2010 8:36 AM  Additional Follow-up for Phone Call Additional follow up Details #1::        told her [facility]. she wanted to make sure medicaid will pay for it. told her to [facility] & ask them but I do believe they will. urged her to use birth control. states she had been lat for her depo. asked if she wanted to get back on it. she was agreeable. ok to use plan B today & come for Depo tomorrow per Dr. Sw[place]il[place]. appt at 4 (has children & can only come iin then) with Dr. Gwendolyn Grant Additional Follow-up by: Golden Circle RN,  March 23, 2010 9:09 AM    Additional Follow-up for Phone Call Additional follow up Details #2::    thanks. Follow-up by: Eustaquio Boyden  MD,  March 23, 2010 2:06 PM  New/Updated Medications: PLAN B 0.75 MG TABS (LEVONORGESTREL) Take 2 pills by mouth now Prescriptions: PLAN B 0.75 MG TABS (LEVONORGESTREL) Take 2 pills by mouth now  #1 pack x 5   Entered and Authorized by:   Sarah Swaziland MD   Signed by:   Sarah Swaziland MD on 03/23/2010   Method used:   [facility] to        [facility]. [phone]* (retail)       [address]       [place], [place]  [address]       Ph: [phone] or [phone]       Fax: [phone]   RxID:   [id number]

## 2011-01-19 NOTE — Assessment & Plan Note (Signed)
Summary: back pain,tcb   Vital Signs:  Patient profile:   25 year old female Height:      67 inches Weight:      137 pounds BMI:     21.53 Temp:     98.5 degrees F oral Pulse rate:   108 / minute BP sitting:   116 / 69  (left arm) Cuff size:   regular  Vitals Entered By: Tessie Fass, CMA (February 02, 2010 11:15 AM) CC: back pain Is Patient Diabetic? No Pain Assessment Patient in pain? yes     Location: back Intensity: 9   Primary Care Provider:  Eustaquio Boyden  MD  CC:  back pain.  History of Present Illness: CC: back pain  lots of strain on back 2/2 twins, having to carry both at same time, and their carriers, and bending down to feed them etc.  Having pain in lower back and right neck/shoulder.  Positional.  Also feeling frustrated because feels she has to take care of all kids on her own.  Taking tylenol/ibuprofen but not really helping.  Flexeril also done, not helping.  Also on zoloft, neurontin 900mg  daily, and vistaril 50mg  at bedtime, along with PNV.  No f/c/ n/v.  No weight changes. weakness in legs.  + numbness in right posterior thigh.  No bowel or bladder incontinence.  Habits & Providers  Alcohol-Tobacco-Diet     Tobacco Status: current     Cigarette Packs/Day: 1.0  Current Medications (verified): 1)  Nutrinate   Chew (Prenatal Vit-Fe Fumarate-Fa) .Marland Kitchen.. 1 Tablet A Day 2)  Promethazine Hcl 12.5 Mg Tabs (Promethazine Hcl) .Marland Kitchen.. 1 By Mouth Every 6 Hours As Needed For Nausea--Will Make You Sleepy 3)  Vitamin B-6 25 Mg Tabs (Pyridoxine Hcl) .Marland Kitchen.. 1 By Mouth Every 8 Hours As Needed For Nausea 4)  Zoloft 100 Mg Tabs (Sertraline Hcl) 5)  Vistaril 50 Mg Caps (Hydroxyzine Pamoate) 6)  Neurontin 300 Mg Caps (Gabapentin) .... 3 By Mouth Nightly 7)  Naprosyn 500 Mg Tabs (Naproxen) .... Take One By Mouth Two Times A Day With Food For 7 Days Then For Pain As Needed 8)  Tramadol Hcl 50 Mg Tabs (Tramadol Hcl) .... Take One By Mouth Two Times A Day As Needed  Breakthrough Pain.  Allergies (verified): No Known Drug Allergies  Past History:  Social History: Last updated: 07/28/2009 Patient has history of teen pregnancy x 3.  First child in DSS custody.  Lives with 4 children (twins 2010).  In stable relationship x 7 months with new FOB.  Smokes 1/2ppd trying to quit.  Has medicaid.  No ETOH, drugs.    Physical Exam  General:  alert, well-developed, well-nourished, and well-hydrated.  uncomfortable appearing but in NAD. vitals reviewed. tachycardic Msk:  neck - pain with palpation of right neck region, noted tightness of right deltoid/trapezius compared to left.  Negative spurling. Back - tender mid cervical as well as lumbar region of spine, also tender at right lumbar paraspinous muscles.  No pain with palpation of SI joint or greater trochanteric bursa.  FROM at spine.   Neurologic:  neurovascularly intact, sensation intact.   Impression & Recommendations:  Problem # 1:  BACK PAIN (ICD-724.5) Discussed use of moist heat or ice, modified activities, medications, and stretching/strengthening exercises provided.  Back care instructions given.  Referral to chiropractor per patient preference.  may try PT if chiropracty not helpful.  Stressed improtance of Layloni slowing down and not trying to do everything by herself ie lifting twins  ONE AT A TIME.  The following medications were removed from the medication list:    Percocet 5-325 Mg Tabs (Oxycodone-acetaminophen) .Marland Kitchen... Take one by mouth q6 hours as needed pain Her updated medication list for this problem includes:    Naprosyn 500 Mg Tabs (Naproxen) .Marland Kitchen... Take one by mouth two times a day with food for 7 days then for pain as needed    Tramadol Hcl 50 Mg Tabs (Tramadol hcl) .Marland Kitchen... Take one by mouth two times a day as needed breakthrough pain.  Orders: Chiropractic Referral (Chiro)  Complete Medication List: 1)  Nutrinate Chew (Prenatal vit-fe fumarate-fa) .Marland Kitchen.. 1 tablet a day 2)   Promethazine Hcl 12.5 Mg Tabs (Promethazine hcl) .Marland Kitchen.. 1 by mouth every 6 hours as needed for nausea--will make you sleepy 3)  Vitamin B-6 25 Mg Tabs (Pyridoxine hcl) .Marland Kitchen.. 1 by mouth every 8 hours as needed for nausea 4)  Zoloft 100 Mg Tabs (Sertraline hcl) 5)  Vistaril 50 Mg Caps (Hydroxyzine pamoate) 6)  Neurontin 300 Mg Caps (Gabapentin) .... 3 by mouth nightly 7)  Naprosyn 500 Mg Tabs (Naproxen) .... Take one by mouth two times a day with food for 7 days then for pain as needed 8)  Tramadol Hcl 50 Mg Tabs (Tramadol hcl) .... Take one by mouth two times a day as needed breakthrough pain.  Patient Instructions: 1)  We will refer you to the chiropractor to see if we can get this back pain better managed. 2)  Try a hot water bottle to your neck and lower back to see if it will help. 3)  Try naprosyn twice daily for pain (stop ibuprofen while taking this), and tramadol for breakthrough pain (tramadol may make you a bit sleepy so try at night first to see how it affects you). 4)  Consider a sling for one of the babies to help with the weight you are lifting. Prescriptions: TRAMADOL HCL 50 MG TABS (TRAMADOL HCL) take one by mouth two times a day as needed breakthrough pain.  #30 x 0   Entered and Authorized by:   Eustaquio Boyden  MD   Signed by:   Eustaquio Boyden  MD on 02/02/2010   Method used:   [facility]        [facility]. [phone]* (retail)       [address] [place]  [address]       Ph: [phone] or [phone]       Fax: [phone]   RxID:   [id number] NAPROSYN 500 MG TABS (NAPROXEN) take one by mouth two times a day with food for 7 days then for pain as needed  #30 x 0   Entered and Authorized by:   Eustaquio Boyden  MD   Signed by:   Eustaquio Boyden  MD on 02/02/2010   Method used:   [facility] to        [facility] [phone]* (retail)       [address]       [place], [place]  [address]       Ph: [phone] or [phone]       Fax:  [phone]   RxID:   [id number]

## 2011-01-19 NOTE — Assessment & Plan Note (Signed)
Summary: std tx,df  Nurse Visit   Allergies: No Known Drug Allergies  Medication Administration  Medication # 1:    Medication: Azithromycin oral    Diagnosis: CHLAMYDIAL INFECTION (ICD-099.41)    Dose: 1 gram    Route: po    Exp Date: 03/18/2012    Lot #: [id number]    Mfr: Pfizer    Patient tolerated medication without complications    Given by: Theresia Lo RN (September 28, 2010 2:04 PM)  Orders Added: 1)  Est Level 1- [facility] [[address]] 2)  Azithromycin oral [Q0144]   Medication Administration  Medication # 1:    Medication: Azithromycin oral    Diagnosis: CHLAMYDIAL INFECTION (ICD-099.41)    Dose: 1 gram    Route: po    Exp Date: 03/18/2012    Lot #: [id number]    Mfr: Pfizer    Patient tolerated medication without complications    Given by: Theresia Lo RN (September 28, 2010 2:04 PM)  Orders Added: 1)  Est Level 1- [facility] [[address]] 2)  Azithromycin oral [Q0144]  Appended Document: std tx,df Communicable Disease report faxed to [facility] health Dept by Tessie Fass CMA.

## 2011-01-19 NOTE — Letter (Signed)
Summary: Generic Letter  [facility]  [address]   [place], [place] [address]   Phone: [phone]  Fax: [phone]    12/02/2010  [facility] MCNEIL [address] [place], [place]  [address]  Dear Ms. MCNEIL,  All of your lab tests were negative (normal).  Please call the office and make an appointment with Dr. Denyse Amass if you are interested in Implanon (arm implant) for birth control.  Let [place] know if you have any problems or questions.   Sincerely,   Ardyth Gal MD  Appended Document: Generic Letter mailed

## 2011-01-19 NOTE — Assessment & Plan Note (Signed)
Summary: dysuria, wants std check also/ls   Vital Signs:  Patient profile:   25 year old female Weight:      127.8 pounds BMI:     20.09 BSA:     1.67 Pulse rate:   85 / minute BP sitting:   108 / 64  Vitals Entered By: Jone Baseman CMA (December 01, 2010 2:41 PM) CC: dysuria Is Patient Diabetic? No Pain Assessment Patient in pain? no        Primary Provider:  Clementeen Graham MD  CC:  dysuria.  History of Present Illness: Pt presents for concern for UTI.  She conplains of urinary frequency and pain and buring with urination.  She denies fever/chills, back pain, nausea, or vomiting.  Pt says she also has had unprotected intercourse with her boyfriend and wants a urine pregnancy test and STD check.  She says she does not desire pregnancy and asked about an Implanon.  She says she does not use condoms because she is only having intercourse with her boyfriend.   Pt also asked about having her TSH checked, says she has been fatigued.   Habits & Providers  Alcohol-Tobacco-Diet     Tobacco Status: current     Tobacco Counseling: to quit use of tobacco products     Cigarette Packs/Day: 1.0  Allergies: No Known Drug Allergies  Review of Systems       Negative except HPI.   Physical Exam  General:  Well-developed,well-nourished,in no acute distress; alert,appropriate and cooperative throughout examination Lungs:  Normal respiratory effort, chest expands symmetrically. Lungs are clear to auscultation, no crackles or wheezes. Heart:  Normal rate and regular rhythm. S1 and S2 normal without gallop, murmur, click, rub or other extra sounds. Abdomen:  Bowel sounds positive,abdomen soft and non-tender without masses, organomegaly or hernias noted. Genitalia:  Normal introitus for age, no external lesions, no vaginal discharge, mucosa pink and moist, no vaginal or cervical lesions, no vaginal atrophy, no friaility or hemorrhage, normal uterus size and position, no adnexal masses or  tenderness Extremities:  No clubbing, cyanosis, edema, or deformity noted     Impression & Recommendations:  Problem # 1:  ACUTE CYSTITIS (ICD-595.0) +nitrite and leuks on UA.  Will send urine for cx to assure proper bacterial coverage.  Pt also has few yeast, will give dose of diflucan.  Her updated medication list for this problem includes:    Keflex 500 Mg Caps (Cephalexin) .Marland Kitchen... Take one by mouth twice a day for 5 days  Orders: Urine Culture-[facility] ([address]60[address]) [facility]- Est  Level 4 ([address])  Problem # 2:  CONTACT OR EXPOSURE TO OTHER VIRAL DISEASES (ICD-V01.79) Pt continues to have unprotected intercourse.  Stressed importance of using condoms.  Will check for GC/ Chlamydia, BV, Trich, Syphilis, and HIV.  Orders: HIV-[facility] ([address]-91478)  Problem # 3:  DYSURIA (ICD-788.1) 2/2 UTI.  Will treat with keflex.  Her updated medication list for this problem includes:    Keflex 500 Mg Caps (Cephalexin) .Marland Kitchen... Take one by mouth twice a day for 5 days  Orders: Urinalysis-[facility] (00000) GC/Chlamydia-[facility] (87591/87491) [facility] ([address]) Urine Culture-[facility] ([address]86-57846) [facility]- Est  Level 4 ([address])  Problem # 4:  CONTRACEPTIVE MANAGEMENT (ICD-V25.09) Encouraged pt. to make appt. with Dr. Denyse Amass to discuss an implanon for birth control as pt. does not desire to become pregnant.  Orders: [facility]- Est  Level 4 ([address])  Complete Medication List: 1)  Nutrinate Chew (Prenatal vit-fe fumarate-fa) .Marland Kitchen.. 1 tablet a day 2)  Promethazine Hcl  12.5 Mg Tabs (Promethazine hcl) .Marland Kitchen.. 1 by mouth every 6 hours as needed for nausea--will make you sleepy 3)  Vitamin B-6 25 Mg Tabs (Pyridoxine hcl) .Marland Kitchen.. 1 by mouth every 8 hours as needed for nausea 4)  Zoloft 100 Mg Tabs (Sertraline hcl) 5)  Vistaril 50 Mg Caps (Hydroxyzine pamoate) 6)  Neurontin 300 Mg Caps (Gabapentin) .... 3 by mouth nightly 7)  Naprosyn 500 Mg Tabs (Naproxen) .... Take one by mouth two times a day with food for 7 days then for pain as needed 8)   Tramadol Hcl 50 Mg Tabs (Tramadol hcl) .... Take one by mouth two times a day as needed breakthrough pain. 9)  Plan B 0.75 Mg Tabs (Levonorgestrel) .... Take 2 pills by mouth now 10)  Keflex 500 Mg Caps (Cephalexin) .... Take one by mouth twice a day for 5 days 11)  Fluconazole 150 Mg Tabs (Fluconazole) .... Take one by mouth once  Other Orders: U Preg-[facility] (04540) RPR-[facility] ([address])  Patient Instructions: 1)  It was nice to meet you today.  You most likely have a UTI, and we will send your urine for culture to make sure.  We will send you a letter with the rest of your lab results.  If you are interested in Implanon as a form of birth control, you should make an appointment with Dr. Denyse Amass to go over the benefits and risks and she will schedule you to have one placed in your arm.   2)  I reccomend you use a condom every time you have intercourse to prevent pregnancy and protect yourself from STD's.   3)  Please contact the office if you have any questions or problems.  Prescriptions: FLUCONAZOLE 150 MG TABS (FLUCONAZOLE) take one by mouth once  #1 x 0   Entered and Authorized by:   Ardyth Gal MD   Signed by:   Ardyth Gal MD on 12/01/2010   Method used:   [facility]        [facility]. [phone]* (retail)       [address], [place]  [address]       Ph: [phone] or [phone]       Fax: [phone]   RxID:   [id number] KEFLEX 500 MG CAPS (CEPHALEXIN) take one by mouth twice a day for 5 days  #10 x 0   Entered and Authorized by:   Ardyth Gal MD   Signed by:   Ardyth Gal MD on 12/01/2010   Method used:   [facility]        [facility]. [phone]* (retail)       [address], [place]  [address]       Ph: [phone] or [phone]       Fax: [phone]   RxID:   [id number]    Orders Added: 1)  Urinalysis-[facility] [00000] 2)  U Preg-[facility] [81025] 3)  GC/Chlamydia-[facility] [87591/87491] 4)  Wet Prep-  [facility] [87210] 5)  HIV-[facility] [37106-26948] 6)  RPR-[facility] [54627-03500] 7)  Urine Culture-[facility] [93818-29937] 8)  [facility]- Est  Level 4 [16967]    Laboratory Results   Urine Tests  Date/Time Received: December 01, 2010 2:36 PM  Date/Time Reported: December 01, 2010 3:44 PM   Routine Urinalysis   Color: yellow Appearance: Hazy Glucose: negative   (Normal Range: Negative) Bilirubin: negative   (Normal Range: Negative) Ketone: negative   (  Normal Range: Negative) Spec. Gravity: >=1.030   (Normal Range: 1.003-1.035) Blood: negative   (Normal Range: Negative) pH: 6.5   (Normal Range: 5.0-8.0) Protein: negative   (Normal Range: Negative) Urobilinogen: 0.2   (Normal Range: 0-1) Nitrite: positive   (Normal Range: Negative) Leukocyte Esterace: trace   (Normal Range: Negative)  Urine Microscopic WBC/HPF: >20 Bacteria/HPF: 3+ Epithelial/HPF: 1-5    Urine HCG: negative Comments: ...............test performed by......Marland KitchenBonnie A. Swaziland, MLS (ASCP)cm  Date/Time Received: December 01, 2010 3:03 PM  Date/Time Reported: December 01, 2010 3:45 PM   Wet [place] Source: vag WBC/hpf: 1-5 Bacteria/hpf: 2+  Rods Clue cells/hpf: none  Negative whiff Yeast/hpf: occ Trichomonas/hpf: none Comments: ...............test performed by......Marland KitchenBonnie A. [place], MLS (ASCP)cm

## 2011-01-19 NOTE — Progress Notes (Signed)
Summary: triage  Phone Note Call from Patient Call back at Home Phone [phone]   Caller: Patient Summary of Call: took morning after pills a few days ago and nopw she is bleeding and cramping - is this normal? Initial call taken by: De Nurse,  March 29, 2010 8:35 AM  Follow-up for Phone Call        told her yes, they may make a woman bleeding & have cramps, also may have nausea. told her if saturating a pad every hour, needs to see md. urged her to get & use regular birth control. told her we can give her condoms. she did not want an appt at this time Follow-up by: Golden Circle RN,  March 29, 2010 8:48 AM  Additional Follow-up for Phone Call Additional follow up Details #1::        thanks. Additional Follow-up by: Eustaquio Boyden  MD,  March 29, 2010 9:26 AM

## 2011-01-19 NOTE — Progress Notes (Signed)
  Phone Note Other Incoming   Caller: Patient  Summary of Call: Feels like she has a UTI, has burning sensation and pain in lower abd, no fever, no vomiting, this woke her from her sleep. States felt like previous UTI in the past. Also wants to be checked for STD, treated for Chlamydia in Oct Told her options of ED or coming in for workin- advised waiting until morning. Told her to take 2 extra strength Tylenol now. Pt in agreement Phone [phone] Initial call taken by: Milinda Antis MD,  December 01, 2010 1:03 AM     Appended Document:  called patient and appointment scheduled for this AM .

## 2011-01-19 NOTE — Progress Notes (Signed)
Summary: Rx Req  Phone Note Call from Patient Call back at [phone]   Caller: Patient Summary of Call: Pt having severe back pain due to lifting her twins so much.  Can she get something called in for this?  She has been taken Motrin. She did make an appt for Wednesday the 16th. Initial call taken by: Clydell Hakim,  January 31, 2010 10:44 AM  Follow-up for Phone Call        will forward message to MD. Follow-up by: Theresia Lo RN,  January 31, 2010 11:57 AM  Additional Follow-up for Phone Call Additional follow up Details #1::        pt may alternate tylenol and motrin until she comes to see me.  Also please try icing back alternating with warm bottle.  I cannot send anything stronger in until I see her. Additional Follow-up by: Eustaquio Boyden  MD,  January 31, 2010 1:45 PM    Additional Follow-up for Phone Call Additional follow up Details #2::    gave her above advice. she will try Follow-up by: Golden Circle RN,  January 31, 2010 2:35 PM

## 2011-01-19 NOTE — Assessment & Plan Note (Signed)
Summary: HIV/std ck,df   Vital Signs:  Patient profile:   25 year old female Height:      67 inches Weight:      127 pounds BMI:     19.96 BSA:     1.67 Temp:     99.1 degrees F Pulse rate:   89 / minute BP sitting:   113 / 71  Vitals Entered By: Jone Baseman CMA (September 27, 2010 11:41 AM) CC: STD check Is Patient Diabetic? No Pain Assessment Patient in pain? no        Primary Care Provider:  Clementeen Graham MD  CC:  STD check.  History of Present Illness: 25 y/o G5 presents for STD check and UPT  Had unprotected sex Fri/Sat, took Plan-B on Sunday.  Has been having unprotected sex with her boyfriend, but met a man last week and had sex with him.  She is concerned that she is now pregnant, or has an STD.  She has 5 children and does not want another baby.  If she is pregnant she desires to end pregnancy.    LMP: 9/12-9/17  Cell: [phone]: pt would like results called to her.     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  First child in DSS custody.  Lives with 4 children (twins 2010).  In stable relationship x 7 months with new FOB.  Smokes 1/2ppd trying to quit.  Has medicaid.  No ETOH, drugs.    Risk Factors: Smoking Status: current (02/02/2010) Packs/Day: 1.0 (02/02/2010)  Review of Systems General:  Denies chills, fatigue, and fever. GU:  Denies abnormal vaginal bleeding, discharge, dysuria, genital sores, hematuria, incontinence, nocturia, urinary frequency, and urinary hesitancy. Derm:  Denies  rash. Heme:  Denies enlarge lymph nodes, pallor, and skin discoloration.  Physical Exam  General:  Well-developed,well-nourished,in no acute distress; alert,appropriate and cooperative throughout examination. vitals reviewed.  Genitalia:  Normal introitus for age, no external lesions, no vaginal discharge, mucosa pink and moist, no vaginal or cervical lesions, no vaginal atrophy, no friaility or hemorrhage, normal uterus size and position, no adnexal masses or tenderness   Impression & Recommendations:  Problem # 1:  CONTRACEPTIVE MANAGEMENT (ICD-V25.09) Assessment New UPT negative.  Discussed that UPT may not be positive for pregnancy since she had unprotected sex on Fri/Sat.  She did take Plan B on Sun, so she should be covered with Plan B.   Discussed contraceptive options: pt does not like to take ocp, she bled for a year on depo, she had discomfort with IUD.  Gave her option of Implanon.  Advised making appt with PCP for Implanon insertion.    Orders: U Preg-[facility] ([id number]) [facility]- Est Level  3 (45409)  Problem # 2:  SEXUALLY TRANSMITTED DISEASE (ICD-099.9) Assessment: New Checked for HIV and RPR.  GC/Chlam samples sent. Wet mount negative.  Discussed precations for STDs (condoms).   Orders: HIV-[facility] ([id number]) RPR-[facility] [phone]) [facility]- Est Level  3 ([id number])  Complete Medication List: 1)  Nutrinate Chew (Prenatal vit-fe fumarate-fa) .Marland Kitchen.. 1 tablet a day 2)  Promethazine Hcl 12.5 Mg Tabs (  Promethazine hcl) .Marland Kitchen.. 1 by mouth every 6 hours as needed for nausea--will make you sleepy 3)  Vitamin B-6 25 Mg Tabs (Pyridoxine hcl) .Marland Kitchen.. 1 by mouth every 8 hours as needed for nausea 4)  Zoloft 100 Mg Tabs (Sertraline hcl) 5)  Vistaril 50 Mg Caps (Hydroxyzine pamoate) 6)  Neurontin 300 Mg Caps (Gabapentin) .... 3 by mouth nightly 7)  Naprosyn 500 Mg Tabs (Naproxen) .... Take one by mouth two times a day with food for 7 days then for pain as needed 8)  Tramadol Hcl 50 Mg Tabs (Tramadol hcl) ....  Take one by mouth two times a day as needed breakthrough pain. 9)  Plan B 0.75 Mg Tabs (Levonorgestrel) .... Take 2 pills by mouth now  Other Orders: GC/Chlamydia-[facility] ([id number]) [facility] [phone])  Laboratory Results   Urine Tests  Date/Time Received: September 27, 2010 12:02 PM  Date/Time Reported: September 27, 2010 2:12 PM     Urine HCG: negative Comments: ...............test performed by......Marland KitchenBonnie A. [place], MLS (ASCP)cm  Date/Time Received: September 27, 2010 12:02 PM  Date/Time Reported: September 27, 2010 2:13 PM   Wet [place] Source: vag WBC/hpf: <5 Bacteria/hpf: 3+  Rods Clue cells/hpf: none  Negative whiff Yeast/hpf: none Trichomonas/hpf: none Comments: ...............test performed by......Marland KitchenBonnie A. [place], MLS (ASCP)cm

## 2011-01-19 NOTE — Progress Notes (Signed)
  Phone Note Call from Patient   Summary of Call: Had unprotected sex on the 25th and on the 1st. Took next choice on the 25th would like Rx called in. Also would like to get implinon scheduled.  Will call in rx and set up time for implinon. Initial call taken by: Clementeen Graham MD,  December 18, 2010 7:18 PM  New Problems: SEXUAL ACTIVITY, HIGH RISK (ICD-V69.2)   New Problems: SEXUAL ACTIVITY, HIGH RISK (ICD-V69.2) New/Updated Medications: NEXT CHOICE 0.75 MG TABS (LEVONORGESTREL) 1 pack by mouth as directed Prescriptions: NEXT CHOICE 0.75 MG TABS (LEVONORGESTREL) 1 pack by mouth as directed  #1 x 0   Entered and Authorized by:   Clementeen Graham MD   Signed by:   Clementeen Graham MD on 12/18/2010   Method used:   Electronically to        [facility] [address] [phone]* (retail)       [address]       [place], [place]  [address]       Ph: [phone] or [phone]       Fax: [phone]   RxID:   [id number]

## 2011-01-31 ENCOUNTER — Telehealth: Payer: Self-pay | Admitting: Family Medicine

## 2011-01-31 NOTE — Telephone Encounter (Signed)
Pt called Emerg line.  Pt has been feeling sick with nausea this morning.  NO vomiting.  Took Morning After Pill last month.  Had menses Jan 6, which was a normal period.  Took pregnancy test today and it was positive.  Pt wanted know how far along her pregnancy is.  Told pt that according to my calculation she is around 6 wks pregnancy, but cannot be exact.  She then asked when she conceived.  Told her that it may be around Jan 20, but I cannot be sure 100%.  Pt states that she will call for appt.  Encouraged her to do that.

## 2011-02-13 ENCOUNTER — Emergency Department (HOSPITAL_COMMUNITY)
Admission: EM | Admit: 2011-02-13 | Discharge: 2011-02-14 | Disposition: A | Discharge status: Home / Self Care | Payer: Medicaid Other | Attending: Emergency Medicine | Admitting: Emergency Medicine

## 2011-02-13 DIAGNOSIS — F1110 Opioid abuse, uncomplicated: Secondary | ICD-10-CM | POA: Insufficient documentation

## 2011-02-13 DIAGNOSIS — R45851 Suicidal ideations: Secondary | ICD-10-CM | POA: Insufficient documentation

## 2011-02-13 DIAGNOSIS — F319 Bipolar disorder, unspecified: Secondary | ICD-10-CM | POA: Insufficient documentation

## 2011-02-13 DIAGNOSIS — O9989 Other specified diseases and conditions complicating pregnancy, childbirth and the puerperium: Secondary | ICD-10-CM | POA: Insufficient documentation

## 2011-02-13 LAB — ETHANOL: Alcohol, Ethyl (B): <5 mg/dL (ref 0–10)

## 2011-02-13 LAB — COMPREHENSIVE METABOLIC PANEL
ALT: 14 U/L (ref 0–35)
AST: 14 U/L (ref 0–37)
Alkaline Phosphatase: 48 U/L (ref 39–117)
CO2: 27 mEq/L (ref 19–32)
GFR calc non Af Amer: >60 mL/min (ref >60)
Glucose, Bld: 65 mg/dL — ABNORMAL LOW (ref 70–99)
Potassium: 3.5 mEq/L (ref 3.5–5.1)
Sodium: 138 mEq/L (ref 135–145)

## 2011-02-13 LAB — CBC
MCH: 28.6 pg (ref 26.0–34.0)
MCHC: 33.2 g/dL (ref 30.0–36.0)
Platelets: 324 10*3/uL (ref 150–400)
RBC: 4.27 MIL/uL (ref 3.87–5.11)
RDW: 13.1 % (ref 11.5–15.5)

## 2011-02-13 LAB — URINALYSIS, ROUTINE W REFLEX MICROSCOPIC
Ketones, ur: 15 mg/dL — AB
Leukocytes, UA: NEGATIVE
Nitrite: NEGATIVE
Protein, ur: 30 mg/dL — AB
Urobilinogen, UA: 1 mg/dL (ref 0.0–1.0)

## 2011-02-13 LAB — DIFFERENTIAL
Basophils Relative: 0 % (ref 0–1)
Eosinophils Absolute: 0.2 10*3/uL (ref 0.0–0.7)
Eosinophils Relative: 3 % (ref 0–5)
Monocytes Absolute: 0.4 10*3/uL (ref 0.1–1.0)
Monocytes Relative: 5 % (ref 3–12)
Neutrophils Relative %: 56 % (ref 43–77)

## 2011-02-13 LAB — POCT PREGNANCY, URINE: Preg Test, Ur: POSITIVE

## 2011-02-14 ENCOUNTER — Inpatient Hospital Stay (HOSPITAL_COMMUNITY): Admission: AD | Admit: 2011-02-14 | Payer: Medicaid Other | Source: Ambulatory Visit | Admitting: Psychiatry

## 2011-02-14 LAB — RAPID URINE DRUG SCREEN, HOSP PERFORMED
Amphetamines: NOT DETECTED
Barbiturates: NOT DETECTED
Benzodiazepines: NOT DETECTED
Cocaine: NOT DETECTED
Opiates: POSITIVE — AB
Tetrahydrocannabinol: NOT DETECTED

## 2011-03-07 LAB — POCT URINALYSIS DIP (DEVICE)
Glucose, UA: NEGATIVE mg/dL
Hgb urine dipstick: NEGATIVE
Nitrite: NEGATIVE

## 2011-03-07 LAB — WET PREP, GENITAL
Trich, Wet Prep: NONE SEEN
Yeast Wet Prep HPF POC: NONE SEEN

## 2011-03-07 LAB — POCT PREGNANCY, URINE: Preg Test, Ur: NEGATIVE

## 2011-03-07 LAB — GC/CHLAMYDIA PROBE AMP, GENITAL: GC Probe Amp, Genital: NEGATIVE

## 2011-03-27 LAB — COMPREHENSIVE METABOLIC PANEL
CO2: 22 mEq/L (ref 19–32)
Calcium: 9 mg/dL (ref 8.4–10.5)
Creatinine, Ser: 0.47 mg/dL (ref 0.4–1.2)
GFR calc non Af Amer: >60 mL/min (ref >60)
Glucose, Bld: 68 mg/dL — ABNORMAL LOW (ref 70–99)
Total Protein: 6.9 g/dL (ref 6.0–8.3)

## 2011-03-27 LAB — CBC
HCT: 22.2 % — ABNORMAL LOW (ref 36.0–46.0)
HCT: 32.5 % — ABNORMAL LOW (ref 36.0–46.0)
Hemoglobin: 11.1 g/dL — ABNORMAL LOW (ref 12.0–15.0)
Hemoglobin: 11.5 g/dL — ABNORMAL LOW (ref 12.0–15.0)
MCHC: 33.7 g/dL (ref 30.0–36.0)
MCHC: 34 g/dL (ref 30.0–36.0)
MCHC: 34.5 g/dL (ref 30.0–36.0)
MCV: 86.6 fL (ref 78.0–100.0)
MCV: 88.2 fL (ref 78.0–100.0)
MCV: 88.1 fL (ref 78.0–100.0)
Platelets: 258 10*3/uL (ref 150–400)
RBC: 3.75 MIL/uL — ABNORMAL LOW (ref 3.87–5.11)
RBC: 3.8 MIL/uL — ABNORMAL LOW (ref 3.87–5.11)
RDW: 12.4 % (ref 11.5–15.5)
RDW: 12.5 % (ref 11.5–15.5)
RDW: 11.9 % (ref 11.5–15.5)

## 2011-03-27 LAB — RAPID URINE DRUG SCREEN, HOSP PERFORMED
Barbiturates: NOT DETECTED
Cocaine: NOT DETECTED
Opiates: NOT DETECTED

## 2011-03-27 LAB — PROTEIN, URINE, 24 HOUR
Protein, 24H Urine: 161 mg/d — ABNORMAL HIGH (ref 50–100)
Protein, Urine: 4 mg/dL

## 2011-03-27 LAB — POCT URINALYSIS DIP (DEVICE)
Glucose, UA: NEGATIVE mg/dL
Glucose, UA: NEGATIVE mg/dL
Glucose, UA: NEGATIVE mg/dL
Hgb urine dipstick: NEGATIVE
Hgb urine dipstick: NEGATIVE
Hgb urine dipstick: NEGATIVE
Nitrite: NEGATIVE
Nitrite: NEGATIVE
Protein, ur: 100 mg/dL — AB
Specific Gravity, Urine: 1.02 (ref 1.005–1.030)
Specific Gravity, Urine: 1.02 (ref 1.005–1.030)
Specific Gravity, Urine: 1.02 (ref 1.005–1.030)
Urobilinogen, UA: 1 mg/dL (ref 0.0–1.0)
Urobilinogen, UA: 1 mg/dL (ref 0.0–1.0)
pH: 7 (ref 5.0–8.0)
pH: 7 (ref 5.0–8.0)
pH: 7 (ref 5.0–8.0)

## 2011-03-27 LAB — LACTATE DEHYDROGENASE: LDH: 205 U/L (ref 94–250)

## 2011-03-27 LAB — CROSSMATCH

## 2011-03-27 LAB — ABO/RH: ABO/RH(D): O POS

## 2011-03-27 LAB — CREATININE CLEARANCE, URINE, 24 HOUR
Collection Interval-CRCL: 24 hours
Creatinine, Urine: 31.4 mg/dL
Urine Total Volume-CRCL: 4025 mL

## 2011-03-27 LAB — URIC ACID: Uric Acid, Serum: 3.8 mg/dL (ref 2.4–7.0)

## 2011-03-28 LAB — URINALYSIS, ROUTINE W REFLEX MICROSCOPIC
Hgb urine dipstick: NEGATIVE
Leukocytes, UA: NEGATIVE
Nitrite: NEGATIVE
Protein, ur: 30 mg/dL — AB
Specific Gravity, Urine: 1.025 (ref 1.005–1.030)
Urobilinogen, UA: >8 mg/dL — ABNORMAL HIGH (ref 0.0–1.0)

## 2011-03-28 LAB — URINE MICROSCOPIC-ADD ON

## 2011-03-28 LAB — POCT URINALYSIS DIP (DEVICE)
Glucose, UA: 100 mg/dL — AB
Glucose, UA: NEGATIVE mg/dL
Hgb urine dipstick: NEGATIVE
Hgb urine dipstick: NEGATIVE
Nitrite: NEGATIVE
Nitrite: NEGATIVE
Protein, ur: 30 mg/dL — AB
Specific Gravity, Urine: 1.02 (ref 1.005–1.030)
Urobilinogen, UA: 4 mg/dL — ABNORMAL HIGH (ref 0.0–1.0)
Urobilinogen, UA: 2 mg/dL — ABNORMAL HIGH (ref 0.0–1.0)
pH: 7 (ref 5.0–8.0)

## 2011-03-29 LAB — POCT URINALYSIS DIP (DEVICE)
Glucose, UA: NEGATIVE mg/dL
Hgb urine dipstick: NEGATIVE
Nitrite: NEGATIVE
Protein, ur: 30 mg/dL — AB
Specific Gravity, Urine: 1.02 (ref 1.005–1.030)
Urobilinogen, UA: 1 mg/dL (ref 0.0–1.0)

## 2011-03-29 LAB — URINALYSIS, ROUTINE W REFLEX MICROSCOPIC
Bilirubin Urine: NEGATIVE
Glucose, UA: NEGATIVE mg/dL
Hgb urine dipstick: NEGATIVE
Protein, ur: NEGATIVE mg/dL
Urobilinogen, UA: 0.2 mg/dL (ref 0.0–1.0)

## 2011-03-29 LAB — WET PREP, GENITAL
Trich, Wet Prep: NONE SEEN
Yeast Wet Prep HPF POC: NONE SEEN

## 2011-03-30 LAB — POCT URINALYSIS DIP (DEVICE)
Bilirubin Urine: NEGATIVE
Glucose, UA: NEGATIVE mg/dL
Glucose, UA: NEGATIVE mg/dL
Hgb urine dipstick: NEGATIVE
Nitrite: NEGATIVE
Nitrite: NEGATIVE
Nitrite: NEGATIVE
Protein, ur: 30 mg/dL — AB
Protein, ur: 30 mg/dL — AB
Urobilinogen, UA: 1 mg/dL (ref 0.0–1.0)
Urobilinogen, UA: 1 mg/dL (ref 0.0–1.0)
pH: 7 (ref 5.0–8.0)
pH: 7 (ref 5.0–8.0)

## 2011-04-03 LAB — POCT URINALYSIS DIP (DEVICE)
Bilirubin Urine: NEGATIVE
Glucose, UA: NEGATIVE mg/dL
Nitrite: NEGATIVE
Urobilinogen, UA: 0.2 mg/dL (ref 0.0–1.0)

## 2011-04-04 LAB — POCT URINALYSIS DIP (DEVICE)
Glucose, UA: NEGATIVE mg/dL
Ketones, ur: NEGATIVE mg/dL
Nitrite: NEGATIVE
pH: 7.5 (ref 5.0–8.0)

## 2011-04-04 LAB — GC/CHLAMYDIA PROBE AMP, GENITAL
Chlamydia, DNA Probe: NEGATIVE
GC Probe Amp, Genital: NEGATIVE

## 2011-04-19 ENCOUNTER — Ambulatory Visit (INDEPENDENT_AMBULATORY_CARE_PROVIDER_SITE_OTHER): Payer: Medicaid Other | Admitting: Family Medicine

## 2011-04-19 ENCOUNTER — Encounter: Payer: Self-pay | Admitting: Family Medicine

## 2011-04-19 ENCOUNTER — Ambulatory Visit: Payer: Medicaid Other | Admitting: Family Medicine

## 2011-04-19 ENCOUNTER — Other Ambulatory Visit (HOSPITAL_COMMUNITY)
Admission: RE | Admit: 2011-04-19 | Discharge: 2011-04-19 | Disposition: A | Discharge status: Home / Self Care | Payer: Medicaid Other | Source: Ambulatory Visit | Attending: Family Medicine | Admitting: Family Medicine

## 2011-04-19 VITALS — BP 110/68 | HR 86 | Temp 98.7°F | Ht 67.25 in | Wt 130.0 lb

## 2011-04-19 DIAGNOSIS — Z331 Pregnant state, incidental: Secondary | ICD-10-CM

## 2011-04-19 DIAGNOSIS — N912 Amenorrhea, unspecified: Secondary | ICD-10-CM

## 2011-04-19 DIAGNOSIS — Z3400 Encounter for supervision of normal first pregnancy, unspecified trimester: Secondary | ICD-10-CM

## 2011-04-19 DIAGNOSIS — Z01419 Encounter for gynecological examination (general) (routine) without abnormal findings: Secondary | ICD-10-CM | POA: Insufficient documentation

## 2011-04-19 DIAGNOSIS — F1120 Opioid dependence, uncomplicated: Secondary | ICD-10-CM | POA: Insufficient documentation

## 2011-04-19 DIAGNOSIS — Z3490 Encounter for supervision of normal pregnancy, unspecified, unspecified trimester: Secondary | ICD-10-CM | POA: Insufficient documentation

## 2011-04-19 LAB — POCT WET PREP (WET MOUNT)
Trichomonas Wet Prep HPF POC: NEGATIVE
Yeast Wet Prep HPF POC: NEGATIVE

## 2011-04-19 LAB — POCT URINE PREGNANCY: Preg Test, Ur: POSITIVE

## 2011-04-19 NOTE — Progress Notes (Signed)
Here to confirm pregnancy 1) Last LMP was 12/23/2010. Was considering  And abortion but not would like to continue with the pregnancy. Aside the the below issues she  pretty well during this pregnancy. No vomiting. No abdominal pain or contractions or bleeding. No ultrasound yet. Has 3 home pregnancy tests so far.  This was not a planned pregnancy.   2) Drug Use: Is currently abusing Rx pain medications. Take 5-10 percocets a day. This started as pain medication for a tooth ache and has become an addition. She has tried to quit cold [place] but has pain and withdrawal symptoms with cessation. Has a suboxone taper in the past which worked well. Sought out inpatient detox during this pregnancy but they did not want to taker her while she was pregnant. She does not want to hurt the baby with her drug use. Additionally she is reluctant to allow social work in her home because is is worried about child protective services. No violence in her home. Children are cared for when she is away or at the doctor's by her friend.   3) STD risk exposure: Would like STD check today as she had unprotected sex with a drug user. No symptoms. No discharge.  Has had STDs in the past.   ROS: No fevers or chills. No nausea or vomiting.   Exam:  Vs noted.  Gen: Well NAD HEENT: EOMI, PERRL, MMM Lungs: CTABL Nl WOB Heart: RRR no MRG Abd: NABS, NT, ND. Fundus at 15 cm  Fetal heart tones present at 150. Pelvis: Normal external and internal genitalia. No discharge. Cervix is normal appearing. No motion tenderness. No masses. Uterus =dates.  Exts: Non edematous BL  LE

## 2011-04-19 NOTE — Assessment & Plan Note (Signed)
Major issue: See pregnancy. Hopefully social work will help.  Will follow. Asked pt to call around the various methadone clinics.

## 2011-04-19 NOTE — Assessment & Plan Note (Signed)
Pregnancy conformation today. 1) This is likely to be a complicated pregnancy due to social situation and current opiate abuse. Also bipolar disorder (no meds not currently active) will likely play a role.  Plan: Obtain initial OB labs today + UDS. Will follow up in  1-2 weeks.  2) Social: Ref[facility] to [facility] for drug abuse. Will communicate with Kandis Fantasia about this situation. Ideally she needs detox and referral to a drug addition center. Aim is for rentention of child custody.  She has no acute need for CPS referral at this time. Will follow up with Child psychotherapist. If I hear of child injury or violence or neglect I will refer to CPS ASAP.  Close follow up with patient.

## 2011-04-20 LAB — OBSTETRIC PANEL
Antibody Screen: NEGATIVE
Basophils Absolute: 0 10*3/uL (ref 0.0–0.1)
Basophils Relative: 0 % (ref 0–1)
Eosinophils Relative: 4 % (ref 0–5)
HCT: 35.3 % — ABNORMAL LOW (ref 36.0–46.0)
MCHC: 32.3 g/dL (ref 30.0–36.0)
Monocytes Absolute: 0.5 10*3/uL (ref 0.1–1.0)
Neutro Abs: 4.8 10*3/uL (ref 1.7–7.7)
Platelets: 333 10*3/uL (ref 150–400)
RDW: 14.6 % (ref 11.5–15.5)

## 2011-04-20 LAB — DRUG SCREEN, URINE
Barbiturate Quant, Ur: NEGATIVE
Creatinine,U: 162 mg/dL
Opiates: POSITIVE — AB
Propoxyphene: NEGATIVE

## 2011-04-20 LAB — SICKLE CELL SCREEN: Sickle Cell Screen: NEGATIVE

## 2011-04-21 ENCOUNTER — Other Ambulatory Visit: Payer: Self-pay | Admitting: Family Medicine

## 2011-04-21 MED ORDER — PRENATAL RX 60-1 MG PO TABS: 1.0000 | ORAL_TABLET | Freq: Every day | ORAL | Status: DC

## 2011-04-21 MED ORDER — CEPHALEXIN 500 MG PO CAPS: 500.0000 mg | ORAL_CAPSULE | Freq: Two times a day (BID) | ORAL | Status: DC

## 2011-04-22 LAB — CULTURE, OB URINE: Colony Count: 35000

## 2011-04-24 ENCOUNTER — Other Ambulatory Visit: Payer: Self-pay | Admitting: Family Medicine

## 2011-04-24 MED ORDER — NITROFURANTOIN MONOHYD MACRO 100 MG PO CAPS: 100.0000 mg | ORAL_CAPSULE | Freq: Two times a day (BID) | ORAL | Status: AC

## 2011-05-02 NOTE — Discharge Summary (Signed)
NAME:  [name], [name] NO.:  [id number]   MEDICAL RECORD NO.:  [id number]          PATIENT TYPE:  WOC   LOCATION:  WOC                          FACILITY:  [facility]   PHYSICIAN:  Lesly Dukes, M.D. DATE OF BIRTH:  [birth year]   DATE OF ADMISSION:  06/10/2009  DATE OF DISCHARGE:  06/12/2009                               DISCHARGE SUMMARY   Ms. Rhonda Cabrera is a 25 year old gravida 5, para 3-0-1-3 who was admitted at  34-1/7 weeks for advanced cervical dilation.   ADMITTING DIAGNOSES:  1. Twin gestation at 34-1/7 weeks.  2. Advanced cervical dilation.   DISCHARGE DIAGNOSES:  1. Twin gestation at 34-1/7 weeks.  2. Advanced cervical dilation.   HOSPITAL COURSE:  Ms. Rhonda Cabrera is a 25 year old gravida 5, para 3-0-1-3  who was admitted at 34-1/7 weeks with advanced cervical dilation that  was noted during an NST at Maternal Fetal Medicine.  Her pregnancy has  been [facility] and has been remarkable for:  1. Twin gestation.  2. Cholestasis.   Her cervix upon arrival to Labor and Delivery was approximately 5 cm.  The patient then progressed to complete dilation and vaginal delivery  was accomplished with Dr. Nicholaus Bloom and Wynelle Bourgeois, certified nurse  midwife in attendance.  Twin A was 4 pounds and 16 ounces.  Apgars of 8  and 9.  Twin B, weight 5 pounds 12 ounces with Apgars 6 and 8.  Both  babies were female.  Twin A was vertex presentation.  Twin B was delivered  as a double footling breech.  There were no perineal lacerations.  Infants were taken to the full term nursery in good condition.  The  patient remained in good condition following the delivery by postpartum  day 1.  The patient was complaining of shortness of breath.  Hemoglobin  was noted to be 7.5 and had been 11.1 upon admission.  She was  transfused with 2 units of packed cells after which she states that she  felt better.  By postpartum day 2, she continued to feel well.  She was  breast  and bottle feeding.  Vital signs are stable.  She expressed the  desire for Depo-Provera for contraception.  Babies were needing to stay  1 more night since they will be inpatient's in the postpartum room, but  Ms. Rhonda Cabrera was deemed to have received the full benefit of her hospital  stay and she was discharged from the [facility] service.   DISCHARGE INSTRUCTIONS:  Per the postpartum handout.   DISCHARGE MEDICATIONS:  1. Motrin 600 mg 1 p.o. q.6 h. p.r.n. pain.  2. Vistaril 25 mg 1 p.o. q.6 h. p.r.n. itching dispense #6.  3. Prenatal vitamin 1 p.o. daily.  4. Iron 1 p.o. b.i.d.   Discharge followup will occur at [facility] in 6 weeks  or as needed.      Cam Hai, C.N.M.      Lesly Dukes, M.D.  Electronically Signed    KS/MEDQ  D:  06/12/2009  T:  06/13/2009  Job:  [id number]

## 2011-05-02 NOTE — Discharge Summary (Signed)
NAME:  CORBIN, HOTT NO.:  [id number]   MEDICAL RECORD NO.:  [id number]          PATIENT TYPE:  WOC   LOCATION:  WOC                          FACILITY:  [facility]   PHYSICIAN:  Scheryl Darter, MD       DATE OF BIRTH:  March 24, 1986   DATE OF ADMISSION:  05/24/2009  DATE OF DISCHARGE:                               DISCHARGE SUMMARY   DISCHARGE DIAGNOSES:  1. Diamniotic dichorionic twin gestation at 56-1/7th weeks'      gestational age.  2. Threatened preterm labor.  3. 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   REASON FOR ADMISSION:  Ms. Eimy Plaza is a 25 year old, gravida 5,  para 3-0-1-3, who was admitted from the clinic on May 24, 2009, at 35-  1/7th weeks' gestational age with some preterm labor.  During that  visit, she noted feeling some pressure, and on examination her cervix  was found to be approximately 4 cm dilated.  The patient was then  admitted for monitoring as well as tocolysis.   HOSPITAL COURSE:  The patient was admitted and started on magnesium,  both tocolytic as well as neuroprotective properties.  She was given  betamethasone and received 2 shots during her hospitalization.  GBS as  well as GC and chlamydia cultures were collected in the clinic.  Her GC  and chlamydia came back negative.  A fetal fibronectin at the time of  admission was positive because of an elevated protein urine on  urinalysis done in the clinic.  A 24-hour urine was collected which  showed a total protein of 161 mg in the 24-hour period.  The patient's  contractions did cease with the magnesium infusion, and after a 48-hour  course of magnesium, the magnesium was discontinued, and the patient's  contractions were very rare and no cervical change was noted after  additional 24 plus hours of monitoring.  At the time of discharge, her  cervix exam was approximately 4-5 cm dilated but approximately 40 or 50%  effaced and the station is minus 3.  She will be discharged home on  bedrest.  No further oral tocolytics will be given.  The patient does  admit that because she has 2 small kids bedrest will be challenging, but  she will utilize the help of her family as well as her boyfriend to the  best that she can.   MEDICATIONS AT DISCHARGE:  The patient is currently taking Activella and  hydroxyzine for her cholestasis.  There are no further medication  orders.   INSTRUCTIONS TO THE PATIENT:  The patient was instructed on bedrest and  finds symptoms to return for evaluation.  Additionally, she was told to  call the clinic to set up a followup appointment for Monday, May 31, 2009.  She will be seen in Maternal Fetal Medicine on either Friday or  sometime next week for her routine antenatal testing.  The patient's  questions were answered, and she voiced understanding of the above  questions.   Disposition at discharge is good.      Odie Sera, DO      Fayrene Fearing  Debroah Loop, MD  Electronically Signed    MC/MEDQ  D:  05/27/2009  T:  05/27/2009  Job:  [id number]

## 2011-05-05 NOTE — Discharge Summary (Signed)
NAME:  Rhonda Cabrera, Rhonda Cabrera              ACCOUNT NO.:  [id number]   MEDICAL RECORD NO.:  [id number]          PATIENT TYPE:  INP   LOCATION:  9145                          [facility]   PHYSICIAN:  Alvira Philips, M.D.   DATE OF BIRTH:  1986-09-21   DATE OF ADMISSION:  09/10/2004  DATE OF DISCHARGE:  09/12/2004                                 DISCHARGE SUMMARY   DISCHARGE DIAGNOSES:  1.  Normal spontaneous vaginal delivery.  2.  Group B streptococcus-positive.  3.  Tobacco abuse.   The patient is discharged with ibuprofen 600 mg p.o. q.6h. p.r.n. for pain  and Colace 100 mg p.o. b.i.d. for constipation.  The patient was given a  Depo shot prior to discharge.  The patient with follow up in [facility] in 6 weeks from discharge.   DISCHARGE INSTRUCTIONS:  Are per routine including decreased light activity  for the next week to 2 weeks.  The patient is also to have nothing in her  vagina for 6 weeks and to continue with breast-feeding as instructed.   BRIEF HOSPITAL COURSE:  This was an 25 year old female who presented in  active labor on day of admission, noticing that she has had contractions for  about the last 12 hours.  She had not had a rupture at this point in time.  Vital signs were stable at the time of admission; and she was 5 cm,  contracted about every 5 to 7 minutes with a normal rhythm, reactive fetal  heart rate.  She was GBS-positive.  Was started on penicillin.  The patient  did have an epidural placed later in the active labor, and the patient was  delivered at 1340 on September 10, 2004.  The normal spontaneous vaginal  delivery with Apgars of 8 and 9.  The patient was bulb-suctioned under  perineum, a normal 3-vessel cord, which had no lacerations, and tolerated  the procedure very well.  Her postpartum course was unremarkable.  Her  hemoglobin was 10.6, down from 11.8, at time of discharge.  She had no  fevers.  Was ambulating well and had no  significant difficulties with  feeding and breast-feeding.  She had some lochia with some cramping and a  little bit of increased strain with some constipation.  The patient was  started on Colace and told to increase the amount of fluids in her diet.  The patient was given a Depo shot prior to discharge for birth control.  The  patient was discharged on September 12, 2004 in stable condition.  She was  afebrile at the time of discharge and will follow up with Dr. Alvira Philips  at [facility] at 6 weeks postpartum.  The patient was given  instructions at the time of discharge, and baby went home with mother at  time of discharge.      RM/MEDQ  D:  10/23/2004  T:  10/23/2004  Job:  [id number]

## 2011-05-17 ENCOUNTER — Ambulatory Visit (INDEPENDENT_AMBULATORY_CARE_PROVIDER_SITE_OTHER): Payer: Medicaid Other | Admitting: Family Medicine

## 2011-05-17 ENCOUNTER — Other Ambulatory Visit: Payer: Self-pay | Admitting: Family Medicine

## 2011-05-17 ENCOUNTER — Encounter: Payer: Self-pay | Admitting: Family Medicine

## 2011-05-17 DIAGNOSIS — Z3480 Encounter for supervision of other normal pregnancy, unspecified trimester: Secondary | ICD-10-CM

## 2011-05-17 DIAGNOSIS — Z3689 Encounter for other specified antenatal screening: Secondary | ICD-10-CM

## 2011-05-17 NOTE — Progress Notes (Signed)
S: Doing well since last visit: [facility] and is now fully detoxed. Additionally is seeing psychiatry for management of her bipolar disorder. Is taking zoloft and abilify. Is happy and healthy currently and excited about this pregnancy.  Is having occasional short and irregular contractions. However she does not think this is labor as she knows what that feels like.  O: Vs normal: Psych: Mood appropriate. Makes good eye contact. No delusions or hallucinations expressed.  No SI/HI.  A/P 1) Pregnancy: Doing well. Needs [place] for dating and anatomy scan.  Will also need early 1 hr GTT as has family history for DM.  Additionally will switch to another provider as we have never seen each other prior to May 2nd. I will be unable to deliver her as I will have a new born and not able to make it to [facility] for del. Will send to Intern for future OB care and delivery.  Discussed with Dr. [place]. 2) Drug Use: Now off opiates. Getting intense out patient treatment for opiate addiction. Recommend UDS upon arrival in labor.  3) Mental Health: On SSRI and Abilify for bipolar disorder. Is seeing psych. Will continue to follow.

## 2011-05-17 NOTE — Patient Instructions (Signed)
Thank you for coming in today. Schedule an appointment with the lab for an early glucose test.  Come back in 4 weeks.  Schedule with one of the "interns" for future pregnancy care.  Let me know if your mood changes.  Try the [facility] for some extra resources.

## 2011-05-19 ENCOUNTER — Ambulatory Visit (HOSPITAL_COMMUNITY)
Admission: RE | Admit: 2011-05-19 | Discharge: 2011-05-19 | Disposition: A | Discharge status: Home / Self Care | Payer: Medicaid Other | Source: Ambulatory Visit | Attending: Family Medicine | Admitting: Family Medicine

## 2011-05-19 DIAGNOSIS — Z363 Encounter for antenatal screening for malformations: Secondary | ICD-10-CM | POA: Insufficient documentation

## 2011-05-19 DIAGNOSIS — O358XX0 Maternal care for other (suspected) fetal abnormality and damage, not applicable or unspecified: Secondary | ICD-10-CM | POA: Insufficient documentation

## 2011-05-19 DIAGNOSIS — Z1389 Encounter for screening for other disorder: Secondary | ICD-10-CM | POA: Insufficient documentation

## 2011-05-19 DIAGNOSIS — Z8751 Personal history of pre-term labor: Secondary | ICD-10-CM | POA: Insufficient documentation

## 2011-05-19 DIAGNOSIS — O99330 Smoking (tobacco) complicating pregnancy, unspecified trimester: Secondary | ICD-10-CM | POA: Insufficient documentation

## 2011-06-15 ENCOUNTER — Other Ambulatory Visit: Payer: Medicaid Other

## 2011-06-20 ENCOUNTER — Other Ambulatory Visit (INDEPENDENT_AMBULATORY_CARE_PROVIDER_SITE_OTHER): Payer: Medicaid Other

## 2011-06-20 DIAGNOSIS — Z111 Encounter for screening for respiratory tuberculosis: Secondary | ICD-10-CM

## 2011-06-20 LAB — GLUCOSE, CAPILLARY: Glucose-Capillary: 148 mg/dL — ABNORMAL HIGH (ref 70–99)

## 2011-06-20 NOTE — Progress Notes (Signed)
1 hr glucose done today,pt schedule for 3 hr gtt on 06/29/11 [facility] Rhonda Cabrera

## 2011-06-22 ENCOUNTER — Ambulatory Visit: Payer: Medicaid Other | Admitting: Family Medicine

## 2011-06-22 ENCOUNTER — Ambulatory Visit (INDEPENDENT_AMBULATORY_CARE_PROVIDER_SITE_OTHER): Payer: Medicaid Other | Admitting: *Deleted

## 2011-06-22 DIAGNOSIS — Z111 Encounter for screening for respiratory tuberculosis: Secondary | ICD-10-CM

## 2011-06-22 DIAGNOSIS — IMO0001 Reserved for inherently not codable concepts without codable children: Secondary | ICD-10-CM

## 2011-06-22 LAB — TB SKIN TEST
Induration: 0
TB Skin Test: NEGATIVE mm

## 2011-06-22 NOTE — Progress Notes (Signed)
PPD negative-0 mm. 

## 2011-06-26 ENCOUNTER — Encounter: Payer: Medicaid Other | Admitting: Family Medicine

## 2011-06-27 ENCOUNTER — Encounter: Payer: Medicaid Other | Admitting: Family Medicine

## 2011-06-29 ENCOUNTER — Ambulatory Visit (INDEPENDENT_AMBULATORY_CARE_PROVIDER_SITE_OTHER): Payer: Medicaid Other | Admitting: Family Medicine

## 2011-06-29 ENCOUNTER — Encounter: Payer: Self-pay | Admitting: Family Medicine

## 2011-06-29 ENCOUNTER — Other Ambulatory Visit: Payer: Self-pay | Admitting: Family Medicine

## 2011-06-29 ENCOUNTER — Other Ambulatory Visit: Payer: Medicaid Other

## 2011-06-29 DIAGNOSIS — Z3480 Encounter for supervision of other normal pregnancy, unspecified trimester: Secondary | ICD-10-CM

## 2011-06-29 DIAGNOSIS — Z331 Pregnant state, incidental: Secondary | ICD-10-CM

## 2011-06-29 LAB — CBC
Hemoglobin: 11.4 g/dL — ABNORMAL LOW (ref 12.0–15.0)
MCH: 29.1 pg (ref 26.0–34.0)
MCV: 90.8 fL (ref 78.0–100.0)
RBC: 3.92 MIL/uL (ref 3.87–5.11)

## 2011-06-29 MED ORDER — PRENATAL VITAMINS PLUS 27-1 MG PO TABS: 1.0000 | ORAL_TABLET | Freq: Every day | ORAL | Status: DC

## 2011-06-29 NOTE — Progress Notes (Signed)
3 hr gtt done today Rhonda Cabrera 

## 2011-06-29 NOTE — Patient Instructions (Signed)
It was a pleasure to meet you today in [facility].  Please make another appointment for followup OB visit in 2 weeks.  We will assign you to a new physician on Dr. Rolene Arbour team to follow you to delivery.  Your growth and weight gain are appropriate for your gestational age (26 weeks 6 days).    Congratulations on the progress you've made!  If you would find it helpful, the [phone]) QUIT NOW phone line can help with smoking cessation.   I will call you back with the results of your lab studies when they come in.  Preterm Labor, Home Care  Preterm labor is defined as having uterine contractions that cause the cervix to open (dilate), shorten and thin (effacement) before completing 37 weeks of pregnancy. Preterm labor accounts for most hospital admissions in pregnant women.  CAUSES  Most cases of preterm labor are unknown.   Small areas of separation of the placenta (abruption).   Excess fluid in the amniotic sac (poly hydramnios).   Twins or more.   The cervix cannot hold the baby because the tissue in the cervix is too weak (incompetent cervix).   Hormone changes.   Vaginal bleeding in more than one of the trimesters.   Infection of the cervix, vagina or bladder.   Smoking.   Antiphosolipid Syndrome. This happens when antibodies affect the protein in the body.  DIAGNOSIS Factors that help predict preterm labor:  History of preterm labor with a past pregnancy.   Bacterial vaginosis in women who previously had preterm labor.   Home uterine activity monitoring that show uterine contractions.   Fetal fibronectin protein that is elevated in women with previous history of preterm labor.   Ultrasound to measure the length of the cervix, if it shows signs of shortening before the due date, it may be a sign of preterm labor.   Using the fibronectin and cervical ultrasound evaluation together is more predictive of impending preterm labor.   Other risk factors include:   Nonwhite  race.   Pregnancy in a 71 year old or younger.   Pregnancy in a 37 year old or older.   Low socioeconomic factors.   Low weight gain during the pregnancy.  PREVENTION Not all preterm labor can be prevented. Some early contractions can be prevented with simple measures.  Drink fluids. Drink eight, 8 ounce glasses of fluids per day. Preterm labor rates go up in the summer months. Dehydration makes the blood volume decrease. This increases the concentration of oxytocin (hormone that causes uterine contractions) in the blood. Hydrating yourself helps prevent this build up.   Watch for signs of infection. Signs include burning during urination, increased need to urinate, abnormal vaginal discharge or unexplained fevers.   Keep your appointments with your caregiver. Call your caregiver right away if you think you are having uterine contractions.   Seek medical advice with questions or problems. It is much better to ask questions of your caregiver than to be in untreated preterm labor unknowingly.  MANAGEMENT OF PRETERM LABOR, IN & OUT OF THE HOSPITAL There are a lot of things to manage in preterm labor. These things include both medical measures and personal care measures for you and/or your baby. Most preterm labor will be handled in the hospital. Things that may be helpful in preterm labor include:  Hydration (oral or IV). Take in eight, 8 ounce glasses of water per day.   Bed rest (home or hospital). Lying on your left side may  help.   Avoid intercourse and orgasms.   Medication (antibiotics) to help prevent infection. This is more likely if your membranes have ruptured or if the contractions are caused by infection. Take medications as directed.   Evaluation of your baby. These tests or procedures help the caregiver know how the baby is doing and may do in the case of an early birth. Including:   Biophysical profile.   Non-stress or stress tests.   Amniocentesis to evaluate the baby  for fetal lung maturity.   Amniotic fluid volume index (AFI).   An ultrasound.   Medications (steroids) to help your baby's lungs mature more quickly may be used. This may happen if preterm birth cannot be stopped.   Tocolytic medications (medications that help stop uterine contractions) may help prolong the pregnancy up to 7 days. This is helpful if steroids medication is needed to help the baby's lungs mature.   Your caregiver may give other advice on preparation for preterm birth.   Progesterone may be beneficial in some cases of preterm labor.  TREATMENT The best treatment is prevention, being aware of risk factors and early detection. Make sure to ask your caregiver to discuss with you the signs and symptoms of preterm labor, especially if you had preterm labor with a previous pregnancy. HOME CARE INSTRUCTIONS  Eat a balanced and nourished diet.   Take your vitamin supplements as directed.   Drink 6 to 8 glasses of liquids a day.   Get plenty of rest and sleep.   Do not have sexual relations if you have preterm labor or are at high risk of having preterm labor.   Follow your care giver's recommendation regarding activities, medications, blood and other tests (ultrasound, amniocentesis, etc.).   Avoid stress.   Avoid hard labor or prolonged exercise if you are at high risk for preterm labor.   Do not smoke.  SEEK IMMEDIATE MEDICAL CARE IF:  You are having contractions.   You have abdominal pain.   You have vaginal bleeding.   You have painful urination.   You have abnormal discharge.   You develop a temperature 102 F (38.9 C) or higher.  Document Released: 12/04/2005 Document Re-Released: 02/28/2010 [facility] Patient Information 2011 [place], [place].

## 2011-06-29 NOTE — Progress Notes (Signed)
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  Had been on Abilify and Zoloft, however felt bad on the meds and stopped them herself 2 months ago.  No side effects from d/c of meds. Still sees Dr Thompson Grayer at [facility] for psychiatric needs.  Denies opioid or other drug or alcohol use in the past 2 months; does still smoke 1ppd cigarettes when stressed.  Discussed smoking cessation and effects of tobacco on pregnancy.  She denies SI or visual/auditory hallucinations, feels good and with support.  Daily fetal movement, occasional erratic irregular contractions that abate quickly.  Plan to complete 3hGTT today, along with 28 week labs.  She is to be reassigned to another physician, as Dr Rivka Safer is away.  She is to come back in 2 weeks for next OB follow up.   Of note, prior preterm deliveries at 34 weeks with twin gestation; therefore, no indication for 17-P (only indicated for spontaneous prior singleton preterm deliveries).

## 2011-06-30 ENCOUNTER — Telehealth: Payer: Self-pay | Admitting: Family Medicine

## 2011-06-30 LAB — HIV ANTIBODY (ROUTINE TESTING W REFLEX): HIV: NONREACTIVE

## 2011-06-30 NOTE — Telephone Encounter (Signed)
Called to let pt know her labs and 3hr GTT are not consistent with GDM. Negative HIV RPR.  Patient reports that she did feel shaky at the end of the GTT, had a glucose of 39.  Told to eat small amounts of food (snacks) every two to three hours.

## 2011-07-02 ENCOUNTER — Other Ambulatory Visit: Payer: Self-pay | Admitting: Family Medicine

## 2011-07-02 NOTE — Telephone Encounter (Signed)
Thinks she has a yeast infection and would like fluconazole Rx called in. I advised to try the OTC topical medications first.  Will follow up if not better.

## 2011-07-12 ENCOUNTER — Other Ambulatory Visit: Payer: Medicaid Other

## 2011-07-12 ENCOUNTER — Ambulatory Visit (INDEPENDENT_AMBULATORY_CARE_PROVIDER_SITE_OTHER): Payer: Medicaid Other | Admitting: Family Medicine

## 2011-07-12 ENCOUNTER — Encounter: Payer: Medicaid Other | Admitting: Family Medicine

## 2011-07-12 ENCOUNTER — Other Ambulatory Visit: Payer: Self-pay | Admitting: Family Medicine

## 2011-07-12 VITALS — BP 108/69 | Temp 98.6°F | Wt 140.9 lb

## 2011-07-12 DIAGNOSIS — Z331 Pregnant state, incidental: Secondary | ICD-10-CM

## 2011-07-12 DIAGNOSIS — F1120 Opioid dependence, uncomplicated: Secondary | ICD-10-CM

## 2011-07-12 DIAGNOSIS — N760 Acute vaginitis: Secondary | ICD-10-CM

## 2011-07-12 LAB — POCT WET PREP (WET MOUNT): Yeast Wet Prep HPF POC: NEGATIVE

## 2011-07-12 MED ORDER — MICONAZOLE NITRATE 2 % VA CREA: TOPICAL_CREAM | VAGINAL | Status: DC

## 2011-07-12 NOTE — Progress Notes (Signed)
  Subjective:    Rhonda Cabrera is a 25 y.o. female being seen today for her obstetrical visit. She is at [redacted]w[redacted]d gestation. Patient reports contractions since several months ago, generally occurring several times daily and never more than once per hour and vaginal irritation. Fetal movement: normal.  Menstrual History: OB History    Grav Para Term Preterm Abortions TAB SAB Ect Mult Living   7 5 0 2 1 0   2 5     Patient's last menstrual period was 12/23/2010.    The following portions of the patient's history were reviewed and updated as appropriate: allergies, current medications, past family history, past medical history, past social history, past surgical history and problem list.  Review of Systems Pertinent items are noted in HPI.   Objective:    BP 108/69  Temp 98.6 F (37 C)  Wt 140 lb 14.4 oz (63.912 kg)  LMP 12/23/2010  Breastfeeding? Unknown FHT:  135 BPM  Uterine Size: 27 cm  Presentation: unsure     Assessment:    Pregnancy 28 and 5/7 weeks   Plan:    28-week labs reviewed, normal.  Will obtain UDS due to past history of substance use.  Denies any currently. Cigarette smoking: smokes 1 PPD. Follow up in 2 Weeks. Preterm labor precautions reviewed.   Also of note, pt complaining of vaginal irritation.  Wet prep is negative but pt feels is like a yeast infection.  Counciled patient to avoid soaps that cause her to itch and gave prescription for miconazole for use if symptoms do not get better over the next week or two.

## 2011-07-12 NOTE — Progress Notes (Signed)
3 hr gtt done today Rhonda Cabrera 

## 2011-07-12 NOTE — Patient Instructions (Signed)
Preterm Labor, Home Care  °Preterm labor is defined as having uterine contractions that cause the cervix to open (dilate), shorten and thin (effacement) before completing 37 weeks of pregnancy. Preterm labor accounts for most hospital admissions in pregnant women.  °CAUSES °· Most cases of preterm labor are unknown.  °· Small areas of separation of the placenta (abruption).  °· Excess fluid in the amniotic sac (poly hydramnios).  °· Twins or more.  °· The cervix cannot hold the baby because the tissue in the cervix is too weak (incompetent cervix).  °· Hormone changes.  °· Vaginal bleeding in more than one of the trimesters.  °· Infection of the cervix, vagina or bladder.  °· Smoking.  °· Antiphosolipid Syndrome. This happens when antibodies affect the protein in the body.  °DIAGNOSIS °Factors that help predict preterm labor: °· History of preterm labor with a past pregnancy.  °· Bacterial vaginosis in women who previously had preterm labor.  °· Home uterine activity monitoring that show uterine contractions.  °· Fetal fibronectin protein that is elevated in women with previous history of preterm labor.  °· Ultrasound to measure the length of the cervix, if it shows signs of shortening before the due date, it may be a sign of preterm labor.  °· Using the fibronectin and cervical ultrasound evaluation together is more predictive of impending preterm labor.  °· Other risk factors include:  °· Nonwhite race.  °· Pregnancy in a 17 year old or younger.  °· Pregnancy in a 35 year old or older.  °· Low socioeconomic factors.  °· Low weight gain during the pregnancy.  °PREVENTION °Not all preterm labor can be prevented. Some early contractions can be prevented with simple measures. °· Drink fluids. Drink eight, 8 ounce glasses of fluids per day. Preterm labor rates go up in the summer months. Dehydration makes the blood volume decrease. This increases the concentration of oxytocin (hormone that causes uterine contractions)  in the blood. Hydrating yourself helps prevent this build up.  °· Watch for signs of infection. Signs include burning during urination, increased need to urinate, abnormal vaginal discharge or unexplained fevers.  °· Keep your appointments with your caregiver. Call your caregiver right away if you think you are having uterine contractions.  °· Seek medical advice with questions or problems. It is much better to ask questions of your caregiver than to be in untreated preterm labor unknowingly.  °MANAGEMENT OF PRETERM LABOR, IN & OUT OF THE HOSPITAL °There are a lot of things to manage in preterm labor. These things include both medical measures and personal care measures for you and/or your baby. Most preterm labor will be handled in the hospital. Things that may be helpful in preterm labor include: °· Hydration (oral or IV). Take in eight, 8 ounce glasses of water per day.  °· Bed rest (home or hospital). Lying on your left side may help.  °· Avoid intercourse and orgasms.  °· Medication (antibiotics) to help prevent infection. This is more likely if your membranes have ruptured or if the contractions are caused by infection. Take medications as directed.  °· Evaluation of your baby. These tests or procedures help the caregiver know how the baby is doing and may do in the case of an early birth. Including:  °· Biophysical profile.  °· Non-stress or stress tests.  °· Amniocentesis to evaluate the baby for fetal lung maturity.  °· Amniotic fluid volume index (AFI).  °· An ultrasound.  °· Medications (steroids) to   help your baby's lungs mature more quickly may be used. This may happen if preterm birth cannot be stopped.  °· Tocolytic medications (medications that help stop uterine contractions) may help prolong the pregnancy up to 7 days. This is helpful if steroids medication is needed to help the baby’s lungs mature.  °· Your caregiver may give other advice on preparation for preterm birth.  °· Progesterone may be  beneficial in some cases of preterm labor.  °TREATMENT °The best treatment is prevention, being aware of risk factors and early detection. Make sure to ask your caregiver to discuss with you the signs and symptoms of preterm labor, especially if you had preterm labor with a previous pregnancy. °HOME CARE INSTRUCTIONS °· Eat a balanced and nourished diet.  °· Take your vitamin supplements as directed.  °· Drink 6 to 8 glasses of liquids a day.  °· Get plenty of rest and sleep.  °· Do not have sexual relations if you have preterm labor or are at high risk of having preterm labor.  °· Follow your care giver’s recommendation regarding activities, medications, blood and other tests (ultrasound, amniocentesis, etc.).  °· Avoid stress.  °· Avoid hard labor or prolonged exercise if you are at high risk for preterm labor.  °· Do not smoke.  °SEEK IMMEDIATE MEDICAL CARE IF: °· You are having contractions.  °· You have abdominal pain.  °· You have vaginal bleeding.  °· You have painful urination.  °· You have abnormal discharge.  °· You develop a temperature 102° F (38.9° C) or higher.  °Document Released: 12/04/2005 Document Re-Released: 02/28/2010 °[employer]® Patient Information ©2011 [employer]. ° ° °

## 2011-07-13 LAB — DRUG SCREEN, URINE
Cocaine Metabolites: NEGATIVE
Creatinine,U: 134.9 mg/dL
Opiates: NEGATIVE
Phencyclidine (PCP): NEGATIVE

## 2011-07-13 LAB — GLUCOSE TOLERANCE, 3 HOURS
Glucose Tolerance, 1 hour: 127 mg/dL (ref 70–189)
Glucose Tolerance, Fasting: 73 mg/dL (ref 70–104)
Glucose, GTT - 3 Hour: 85 mg/dL (ref 70–144)

## 2011-07-14 ENCOUNTER — Encounter: Payer: Self-pay | Admitting: *Deleted

## 2011-07-17 ENCOUNTER — Encounter: Payer: Medicaid Other | Admitting: Family Medicine

## 2011-07-27 ENCOUNTER — Ambulatory Visit: Payer: Medicaid Other | Admitting: Family Medicine

## 2011-07-27 VITALS — BP 108/64 | Wt 141.0 lb

## 2011-07-27 DIAGNOSIS — Z3480 Encounter for supervision of other normal pregnancy, unspecified trimester: Secondary | ICD-10-CM

## 2011-07-27 MED ORDER — ENSURE PO LIQD: 1.0000 | Freq: Two times a day (BID) | ORAL | Status: DC

## 2011-07-27 NOTE — Patient Instructions (Signed)
It was good to see you today! I am giving you a prescription for Ensure.  Take it two to three times per day. We will be scheduling an appointment for an ultrasound over at [facility]. Come back to see me in two weeks.

## 2011-07-27 NOTE — Progress Notes (Signed)
  Subjective:    Rhonda Cabrera is a 25 y.o. female being seen today for her obstetrical visit. She is at [redacted]w[redacted]d gestation. Patient reports backache and pelvic pain accompanied by occasional contractions. Fetal movement: normal.  Menstrual History: OB History    Grav Para Term Preterm Abortions TAB SAB Ect Mult Living   7 5 0 2 1 0   2 5      Patient's last menstrual period was 12/23/2010.    The following portions of the patient's history were reviewed and updated as appropriate: allergies, current medications, past family history, past medical history, past social history, past surgical history and problem list.  Review of Systems Pertinent items are noted in HPI.   Objective:    BP 108/64  Wt 141 lb (63.957 kg)  LMP 12/23/2010  Breastfeeding? Unknown FHT:  135 BPM  Uterine Size: 28 cm and size less than dates  Presentation: unsure     Assessment:    Pregnancy 30 and 6/7 weeks   Plan:    28-week labs reviewed, normal Cigarette smoking: councild to stop. Follow up in 2 Weeks.    Visit precepted with Dr. Mauricio Po.  Addendum for visit dated 07/27/11.  ROS is per all ready stated HPI.  FHT/Uterine size per flowsheet.  A/P:  Will obtain US to assess for growth.  Pt counciled to stop smoking.

## 2011-08-01 ENCOUNTER — Telehealth: Payer: Self-pay | Admitting: Family Medicine

## 2011-08-01 NOTE — Telephone Encounter (Signed)
Mrs. Persad need the request for [facility] to be placed on the standard [facility] form.  The office will not except on rx form.  Please fax to [phone].  Need in before 08/17/11

## 2011-08-02 ENCOUNTER — Ambulatory Visit (HOSPITAL_COMMUNITY)
Admission: RE | Admit: 2011-08-02 | Discharge: 2011-08-02 | Disposition: A | Discharge status: Home / Self Care | Payer: Medicaid Other | Source: Ambulatory Visit | Attending: Family Medicine | Admitting: Family Medicine

## 2011-08-02 DIAGNOSIS — Z8751 Personal history of pre-term labor: Secondary | ICD-10-CM | POA: Insufficient documentation

## 2011-08-02 DIAGNOSIS — Z3480 Encounter for supervision of other normal pregnancy, unspecified trimester: Secondary | ICD-10-CM

## 2011-08-02 DIAGNOSIS — O365990 Maternal care for other known or suspected poor fetal growth, unspecified trimester, not applicable or unspecified: Secondary | ICD-10-CM | POA: Insufficient documentation

## 2011-08-02 DIAGNOSIS — O99330 Smoking (tobacco) complicating pregnancy, unspecified trimester: Secondary | ICD-10-CM | POA: Insufficient documentation

## 2011-08-04 NOTE — Telephone Encounter (Signed)
Prescription filled out and placed in To Fax box.

## 2011-08-15 ENCOUNTER — Ambulatory Visit (INDEPENDENT_AMBULATORY_CARE_PROVIDER_SITE_OTHER): Payer: Medicaid Other | Admitting: Family Medicine

## 2011-08-15 DIAGNOSIS — Z3480 Encounter for supervision of other normal pregnancy, unspecified trimester: Secondary | ICD-10-CM

## 2011-08-24 NOTE — Progress Notes (Signed)
Subjective:    Rhonda Cabrera is a 25 y.o. female being seen today for her obstetrical visit. She is at [redacted]w[redacted]d gestation. Patient reports no complaints. Fetal movement: normal.  Menstrual History: OB History    Grav Para Term Preterm Abortions TAB SAB Ect Mult Living   7 5 0 2 1 0   2 5     Patient's last menstrual period was 12/23/2010.    Review of Systems Pertinent items are noted in HPI.   Objective:    BP 103/72  Temp 99.5 F (37.5 C)  Wt 144 lb (65.318 kg)  LMP 12/23/2010  Breastfeeding? Unknown FHT:  150 BPM  Uterine Size: 31 cm  Presentation: cephalic     Assessment:    Pregnancy 33 and 4/7 weeks   Plan:    28-week labs reviewed, normal BTL consent signed.  Discussed breast vs bottle feeding.    Pt is uncertain at this time.  Filled out [facility] prescription for ensure shakes as pt has had problems with size<dates.  Most recent US was WNL. Follow up in 2 Weeks.  Would plan to have the visit following this be in [facility] clinic as the patient did not make it to there in the 24-28 week period.

## 2011-08-24 NOTE — Patient Instructions (Signed)
It was great to see you today! I have filled out your [facility] prescription.  If you have any problems let me know. Your pregnancy is progressing wonderfully! Please come back to see me in 2 weeks.

## 2011-08-28 ENCOUNTER — Telehealth: Payer: Self-pay | Admitting: Family Medicine

## 2011-08-28 NOTE — Telephone Encounter (Signed)
She was told to go on over to [facility]U, sorry I left that part out.

## 2011-08-28 NOTE — Telephone Encounter (Signed)
Is 35 weeks and cramping really bad so Dennison Nancy said to tell her to go on over to [facility].

## 2011-08-31 ENCOUNTER — Ambulatory Visit (INDEPENDENT_AMBULATORY_CARE_PROVIDER_SITE_OTHER): Payer: Medicaid Other | Admitting: Family Medicine

## 2011-08-31 ENCOUNTER — Telehealth: Payer: Self-pay | Admitting: *Deleted

## 2011-08-31 DIAGNOSIS — Z3480 Encounter for supervision of other normal pregnancy, unspecified trimester: Secondary | ICD-10-CM

## 2011-08-31 LAB — POCT URINALYSIS DIPSTICK
Bilirubin, UA: NEGATIVE
Blood, UA: NEGATIVE
Ketones, UA: NEGATIVE
Protein, UA: NEGATIVE
pH, UA: 7.5

## 2011-08-31 NOTE — Patient Instructions (Signed)
Nice to meet you. Your cervix is 1cm today. See Dr. Louanne Belton tomorrow, he will go over your lab tests with you. Drink lots of fluids today. GO to [facility]U if your contractions are <10 minutes apart or you have bleeding, fevers, other concerns.

## 2011-08-31 NOTE — Progress Notes (Signed)
36.6 weeks with irregular contractions. Began noticing 4 days ago. Have waxed and waned, last pm were q18-30 minutes then subsided. This morning felt q10 minutes for 45 minutes and now subsided. Denies fevers, chills, illness, vaginal DC, bleeding or change in fetal activity. Last pregnancy was twin gestation delivered at 34 wks and required tocolysis (notably her cervix remained at 5 cm for over one week while she was ambulatory). No preterm deliveries prior to this. Has not had cervix checked prior to today.   SVE: 1cm/thick/-2, Vertex presentation. No cervical tenderness or abnormal discharge.   A/P: [id number] at 35.6 weeks with irregular contractions, no cervical change. Collected specimen for UA, OB culture and wet prep to rule out infectious causes of PTL, however the clinic microscope is not working so unable to process. Pt will need follow up wet prep at next check. Encouraged to drink lots of fluids and discussed PTL precautions. To present to [facility] for ctx <q10 minutes sustained. F/u for ROB as scheduled.

## 2011-08-31 NOTE — Telephone Encounter (Signed)
Patient calls stating last night she had irregular contractions 15-18 minutes apart. This morning earlier, contractions were 10 minutes apart and regular. Now 20-25 minutes apart and regular. This is sixth child . Delivered twins at 34 weeks 2 years ago.  She is 36 weeks now.  Consulted with Dr. Earnest Bailey and she advises for patient to come in here to be checked. She can be here in 10 minutes.

## 2011-09-01 ENCOUNTER — Ambulatory Visit (INDEPENDENT_AMBULATORY_CARE_PROVIDER_SITE_OTHER): Payer: Medicaid Other | Admitting: Family Medicine

## 2011-09-01 DIAGNOSIS — Z331 Pregnant state, incidental: Secondary | ICD-10-CM

## 2011-09-01 NOTE — Progress Notes (Signed)
S: Still having problems with cramps irregularly and some irregular contractions.  No bleeding.  Good fetal movement. O: Vitals reviewed.  Some weight gain, slightly less than would be ideal.  Will continue to monitor. A/P: 36 weeks, doing well.  Encourage fluids intake.  GBS and gc/chl today.  Discussed breast feeding, circumcision.  Informed of childbirth classes at [facility].  Plan for f/u in 1 week in OB clinic.  Reviewed preterm labor precautions and kick counts.

## 2011-09-01 NOTE — Patient Instructions (Signed)
It was great to see you today! Your baby is growing well. Please come back in 1 week for a visit in our OB clinic. [facility] hear about [facility], if you are interested.

## 2011-09-02 LAB — GC/CHLAMYDIA PROBE AMP, GENITAL: GC Probe Amp, Genital: NEGATIVE

## 2011-09-05 ENCOUNTER — Inpatient Hospital Stay (HOSPITAL_COMMUNITY)
Admission: AD | Admit: 2011-09-05 | Discharge: 2011-09-06 | Disposition: A | Discharge status: Home / Self Care | Payer: Medicaid Other | Source: Ambulatory Visit | Attending: Obstetrics & Gynecology | Admitting: Obstetrics & Gynecology

## 2011-09-05 ENCOUNTER — Telehealth: Payer: Self-pay | Admitting: Family Medicine

## 2011-09-05 DIAGNOSIS — O4700 False labor before 37 completed weeks of gestation, unspecified trimester: Secondary | ICD-10-CM | POA: Insufficient documentation

## 2011-09-05 NOTE — Telephone Encounter (Signed)
Patient reports she is [redacted] weeks pregnant, she is feeling contractions about every 7 minutes.  She says they are only a little uncomfortable, and she does not feel them in her back, which is what she felt during prior labor.  Pt is wondering if they are real contractions.  Denies vaginal bleeding/leakage of fluids.  Advised pt go to [facility], told her I cannot tell if they are changing her cervix over the phone, she needs to go to [facility] to be evaluated.  Told her it is possible they are braxton-hicks contractions and she is not in labor yet, but no way to evaluate over the phone. Pt agrees to go to [facility]U now.

## 2011-09-06 ENCOUNTER — Encounter (HOSPITAL_COMMUNITY): Payer: Self-pay | Admitting: *Deleted

## 2011-09-06 ENCOUNTER — Inpatient Hospital Stay (HOSPITAL_COMMUNITY)
Admission: AD | Admit: 2011-09-06 | Discharge: 2011-09-09 | DRG: 775 | Disposition: A | Discharge status: Home / Self Care | Payer: Medicaid Other | Source: Ambulatory Visit | Attending: Obstetrics & Gynecology | Admitting: Obstetrics & Gynecology

## 2011-09-06 DIAGNOSIS — IMO0001 Reserved for inherently not codable concepts without codable children: Secondary | ICD-10-CM

## 2011-09-06 DIAGNOSIS — Z3490 Encounter for supervision of normal pregnancy, unspecified, unspecified trimester: Secondary | ICD-10-CM | POA: Diagnosis present

## 2011-09-06 LAB — RAPID URINE DRUG SCREEN, HOSP PERFORMED
Amphetamines: NOT DETECTED
Barbiturates: NOT DETECTED

## 2011-09-06 LAB — CBC
HCT: 35.6 % — ABNORMAL LOW (ref 36.0–46.0)
Hemoglobin: 11.9 g/dL — ABNORMAL LOW (ref 12.0–15.0)
MCHC: 33.4 g/dL (ref 30.0–36.0)
RDW: 13.7 % (ref 11.5–15.5)
WBC: 11.4 10*3/uL — ABNORMAL HIGH (ref 4.0–10.5)

## 2011-09-06 LAB — RPR: RPR Ser Ql: NONREACTIVE

## 2011-09-06 MED ORDER — OXYTOCIN 20 UNITS IN LACTATED RINGERS INFUSION - SIMPLE
1.0000 m[IU]/min | INTRAVENOUS | Status: DC
  Administered 2011-09-06: 1 m[IU]/min via INTRAVENOUS
  Filled 2011-09-06: qty 1000

## 2011-09-06 MED ORDER — FENTANYL 2.5 MCG/ML BUPIVACAINE 1/10 % EPIDURAL INFUSION (WH - ANES)
14.0000 mL/h | INTRAMUSCULAR | Status: DC
  Administered 2011-09-06 (×2): 14 mL/h via EPIDURAL
  Filled 2011-09-06 (×2): qty 60

## 2011-09-06 MED ORDER — FLEET ENEMA 7-19 GM/118ML RE ENEM: 1.0000 | ENEMA | RECTAL | Status: DC | PRN

## 2011-09-06 MED ORDER — CITRIC ACID-SODIUM CITRATE 334-500 MG/5ML PO SOLN: 30.0000 mL | ORAL | Status: DC | PRN

## 2011-09-06 MED ORDER — EPHEDRINE 5 MG/ML INJ
10.0000 mg | INTRAVENOUS | Status: DC | PRN
  Filled 2011-09-06 (×2): qty 4

## 2011-09-06 MED ORDER — LACTATED RINGERS IV SOLN: 500.0000 mL | Freq: Once | INTRAVENOUS | Status: DC

## 2011-09-06 MED ORDER — LIDOCAINE HCL (PF) 1 % IJ SOLN
30.0000 mL | INTRAMUSCULAR | Status: AC | PRN
  Administered 2011-09-07: 30 mL via SUBCUTANEOUS
  Filled 2011-09-06: qty 30

## 2011-09-06 MED ORDER — BUTORPHANOL TARTRATE 2 MG/ML IJ SOLN
INTRAMUSCULAR | Status: AC
  Administered 2011-09-06: 1 mg via INTRAVENOUS
  Filled 2011-09-06: qty 1

## 2011-09-06 MED ORDER — LACTATED RINGERS IV SOLN
INTRAVENOUS | Status: DC
  Administered 2011-09-06: 21:00:00 via INTRAVENOUS
  Administered 2011-09-06: 125 mL/h via INTRAVENOUS

## 2011-09-06 MED ORDER — PHENYLEPHRINE 40 MCG/ML (10ML) SYRINGE FOR IV PUSH (FOR BLOOD PRESSURE SUPPORT)
80.0000 ug | PREFILLED_SYRINGE | INTRAVENOUS | Status: DC | PRN
  Filled 2011-09-06 (×2): qty 5

## 2011-09-06 MED ORDER — BUTORPHANOL TARTRATE 2 MG/ML IJ SOLN
1.0000 mg | INTRAMUSCULAR | Status: AC | PRN
  Administered 2011-09-06: 1 mg via INTRAVENOUS

## 2011-09-06 MED ORDER — EPHEDRINE 5 MG/ML INJ
10.0000 mg | INTRAVENOUS | Status: DC | PRN
  Filled 2011-09-06: qty 4

## 2011-09-06 MED ORDER — OXYCODONE-ACETAMINOPHEN 5-325 MG PO TABS: 2.0000 | ORAL_TABLET | ORAL | Status: DC | PRN

## 2011-09-06 MED ORDER — TERBUTALINE SULFATE 1 MG/ML IJ SOLN: 0.2500 mg | Freq: Once | INTRAMUSCULAR | Status: AC | PRN

## 2011-09-06 MED ORDER — DIPHENHYDRAMINE HCL 50 MG/ML IJ SOLN
12.5000 mg | INTRAMUSCULAR | Status: DC | PRN
Start: 2011-09-06 — End: 2011-09-07
  Administered 2011-09-06: 12.5 mg via INTRAVENOUS
  Filled 2011-09-06: qty 1

## 2011-09-06 MED ORDER — PHENYLEPHRINE 40 MCG/ML (10ML) SYRINGE FOR IV PUSH (FOR BLOOD PRESSURE SUPPORT)
80.0000 ug | PREFILLED_SYRINGE | INTRAVENOUS | Status: DC | PRN
  Filled 2011-09-06: qty 5

## 2011-09-06 MED ORDER — IBUPROFEN 600 MG PO TABS: 600.0000 mg | ORAL_TABLET | Freq: Four times a day (QID) | ORAL | Status: DC | PRN

## 2011-09-06 MED ORDER — LACTATED RINGERS IV SOLN: 500.0000 mL | INTRAVENOUS | Status: DC | PRN

## 2011-09-06 MED ORDER — ONDANSETRON HCL 4 MG/2ML IJ SOLN: 4.0000 mg | Freq: Four times a day (QID) | INTRAMUSCULAR | Status: DC | PRN

## 2011-09-06 MED ORDER — SODIUM BICARBONATE 8.4 % IV SOLN
INTRAVENOUS | Status: DC | PRN
  Administered 2011-09-06: 5 mL via EPIDURAL

## 2011-09-06 MED ORDER — OXYTOCIN 20 UNITS IN LACTATED RINGERS INFUSION - SIMPLE: 125.0000 mL/h | Freq: Once | INTRAVENOUS | Status: DC

## 2011-09-06 MED ORDER — OXYTOCIN BOLUS FROM INFUSION
500.0000 mL | Freq: Once | INTRAVENOUS | Status: DC
  Filled 2011-09-06: qty 500

## 2011-09-06 MED ORDER — BUTORPHANOL TARTRATE 2 MG/ML IJ SOLN
1.0000 mg | Freq: Once | INTRAMUSCULAR | Status: AC
  Administered 2011-09-06: 1 mg via INTRAVENOUS

## 2011-09-06 MED ORDER — BUTORPHANOL TARTRATE 2 MG/ML IJ SOLN
INTRAMUSCULAR | Status: AC
  Filled 2011-09-06: qty 1

## 2011-09-06 MED ORDER — ACETAMINOPHEN 325 MG PO TABS: 650.0000 mg | ORAL_TABLET | ORAL | Status: DC | PRN

## 2011-09-06 NOTE — H&P (Signed)
Rhonda Cabrera is a 25 y.o. female presenting for contractions.  States contractions started yesterday at 5pm, have gotten stronger and more regular every 6-7 minutes.  Denies loss of fluid, vaginal bleeding.  Reports good fetal movement. Maternal Medical History:  Reason for admission: Reason for admission: contractions.  Contractions: Onset was 13-24 hours ago.   Frequency: regular.   Duration is approximately 70 seconds.   Perceived severity is strong.    Fetal activity: Perceived fetal activity is normal.   Last perceived fetal movement was within the past hour.    Prenatal complications: no prenatal complications Prenatal Complications - Diabetes: none.    OB History    Grav Para Term Preterm Abortions TAB SAB Ect Mult Living   7 5 0 2 1 0   2 5     Past Medical History  Diagnosis Date  . Bipolar 1 disorder   . Opiate addiction   . Abnormal Pap smear   . Depression    Past Surgical History  Procedure Date  . Vaginal deliveries    Family History: family history includes Diabetes in her maternal grandmother, paternal grandfather, and paternal grandmother; Drug abuse in her father and mother; and Hypertension in her maternal grandmother. Social History:  reports that she has been smoking Cigarettes.  She has been smoking about 1 packs per day. She does not have any smokeless tobacco history on file. She reports that she does not drink alcohol or use illicit drugs.  Review of Systems  Constitutional: Negative.   HENT: Negative.   Eyes: Negative.   Respiratory: Negative.   Cardiovascular: Negative.   Gastrointestinal: Negative.   Genitourinary: Negative.   Skin: Negative.     Dilation: 4 Effacement (%): 80 Station: -2 Exam by:: B.Bethea RN Blood pressure 113/67, pulse 82, temperature 98.6 F (37 C), temperature source Oral, resp. rate 18, last menstrual period 12/23/2010, unknown if currently breastfeeding. Maternal Exam:  Uterine Assessment: Contraction  strength is moderate.  Contraction duration is 80 seconds. Contraction frequency is regular.   Abdomen: Estimated fetal weight is 6.5lbs.   Fetal presentation: vertex  Pelvis: adequate for delivery.   Cervix: Cervix evaluated by digital exam.     Physical Exam  Constitutional: She is oriented to person, place, and time. She appears well-developed and well-nourished. She appears distressed (mild to moderate distress with contractions).  HENT:  Head: Normocephalic and atraumatic.  Mouth/Throat: Oropharynx is clear and moist.  Eyes: No scleral icterus.  Neck: Normal range of motion. Neck supple.  Cardiovascular: Normal rate, regular rhythm, normal heart sounds and intact distal pulses.  Exam reveals no gallop.   No murmur heard. Respiratory: Effort normal and breath sounds normal. She has no wheezes. She has no rales.  GI: There is no tenderness.       Gravid   Musculoskeletal: She exhibits no edema and no tenderness.  Neurological: She is alert and oriented to person, place, and time.  Skin: Skin is warm and dry. No rash noted.  Psychiatric: She has a normal mood and affect.    Prenatal labs: ABO, Rh: O/POS/-- (05/02 1614) Antibody: NEG (05/02 1614) Rubella:  immune RPR: NON REAC (07/12 1025)  HBsAg: NEGATIVE (05/02 1614)  HIV: NON REACTIVE (07/12 1025)  GBS:   negative  Assessment/Plan: 25 year old [id number] at [redacted]w[redacted]d by LMP and ultrasound presenting in active labor. -Rh +, rubella immune, GBS negative -hx of opioid addiction -- will check UDS -normal L&D admission orders -planning on epidural -breast feeding  and BTL after delivery  BOOTH, Mikya Don 09/06/2011, 3:53 PM

## 2011-09-06 NOTE — Progress Notes (Signed)
After further discussion with Dr. Berniece Salines have decided to begin pitocin once epidural in place instead of ROM. Rhonda Cabrera 09/06/2011, 7:27 PM

## 2011-09-06 NOTE — H&P (Signed)
Agree with above 

## 2011-09-06 NOTE — Anesthesia Procedure Notes (Signed)
Epidural Patient location during procedure: OB Start time: 09/06/2011 8:10 PM  Staffing Anesthesiologist: Jiles Garter  Preanesthetic Checklist Completed: patient identified, site marked, surgical consent, pre-op evaluation, timeout performed, IV checked, risks and benefits discussed and monitors and equipment checked  Epidural Patient position: sitting Prep: site prepped and draped and DuraPrep Patient monitoring: continuous pulse ox and blood pressure Approach: midline Injection technique: LOR air  Needle:  Needle type: Tuohy  Needle gauge: 17 G Needle length: 9 cm Needle insertion depth: 5 cm cm Catheter type: closed end flexible Catheter size: 19 Gauge Catheter at skin depth: 10 cm Test dose: negative  Assessment Events: blood not aspirated, injection not painful, no injection resistance, negative IV test and no paresthesia  Additional Notes Dosing of Epidural: 1st dose, through needle...... ( mg expressed as equavilent  cc's medication  from .1%Bupiv / fentanyl syringe from L&D pump)...............  5mg  Marcaine  2nd dose, through catheter after waiting 3 minutes.... epi 1:200K + Xylocaine 40 mg 3rd dose, through catheter, after waiting 3 minutes...Marland KitchenMarland Kitchenepi 1:200K + Xylocaine 60 mg ( 2% Xylo charted as a single dose in Epic Meds for ease of charting; actual dosing was fractionated as above, for saftey's sake)  As each dose occurred, patient was free of IV sx; and patient exhibited no evidence of SA injection.  Patient is more comfortable after epidural dosed. Please see RN's note for documentation of vital signs,and FHR which are stable.

## 2011-09-06 NOTE — Progress Notes (Signed)
Rhonda Cabrera is a 25 y.o. [id number] at [redacted]w[redacted]d, admitted for SOL  Subjective: Doing well s/p 1 stadol.  Is planning epidural shortly.  Objective: BP 116/74  Pulse 82  Temp(Src) 98.1 F (36.7 C) (Oral)  Resp 20  LMP 12/23/2010  Breastfeeding? Unknown  Fetal Heart Rate: 130 Variability: mod Accelerations: present Decelerations: absent  Contractions: Q3-5  SVE:   Dilation: 4 Effacement (%): 80 Station: -2 Exam by:: Rich  Pitocin: NA Mag: NA   Assessment / Plan: 25 y.o. [id number] at [redacted]w[redacted]d here for SOL  Labor: Progressing since last night.  Will continue to monitor.  Pt does have a fairly posterior cervix at this time. Preeclampsia:  NA Fetal Wellbeing: Cat I Pain Control:  IV PRN, epidural on request I/D:  NA  Dominque Marlin 09/06/2011, 5:17 PM

## 2011-09-06 NOTE — Progress Notes (Signed)
Rhonda Cabrera is a 25 y.o. [id number] at [redacted]w[redacted]d, admitted for SOL  Subjective: Doing well s/p epidural.  Objective: BP 112/66  Pulse 69  Temp(Src) 97.7 F (36.5 C) (Oral)  Resp 18  SpO2 100%  LMP 12/23/2010  Breastfeeding? Unknown  Fetal Heart Rate: 125 Variability: mod Accelerations: present Decelerations: absent  Contractions: Q1-3  SVE:   Dilation: 9 Effacement (%): 90 Station: -1 Exam by:: Dr. Louanne Belton  Pitocin: 5 unit per min Mag: NA   Assessment / Plan: 25 y.o. [id number] at [redacted]w[redacted]d here for SOL  Labor: Now with augmentation of labor for prolonged latent phase.  Good progress.  Anticipate being able to start pushing within the next ~2 hrs. Preeclampsia:  NA Fetal Wellbeing: Cat I Pain Control:  Epidural I/D:  NA  Jd Mccaster 09/06/2011, 11:37 PM

## 2011-09-06 NOTE — Anesthesia Preprocedure Evaluation (Addendum)
Anesthesia Evaluation  Name, MR# and DOB Patient awake  General Assessment Comment  Reviewed: Allergy & Precautions, H&P , Patient's Chart, lab work & pertinent test results  Airway Mallampati: II TM Distance: >3 FB Neck ROM: full    Dental  (+) Teeth Intact   Pulmonary  clear to auscultation  breath sounds clear to auscultation none    Cardiovascular regular Normal    Neuro/Psych   GI/Hepatic/Renal   Endo/Other    Abdominal   Musculoskeletal   Hematology   Peds  Reproductive/Obstetrics (+) Pregnancy    Anesthesia Other Findings No change in H&P from epidural pre op.   Patient expressed concern as epidural was not adequate for delivery.  Discussed with Dr. Lilli Light to dose epidural in short stay with monitoring to access the epidural.               Anesthesia Physical Anesthesia Plan  ASA: II  Anesthesia Plan: Epidural   Post-op Pain Management:    Induction:   Airway Management Planned:   Additional Equipment:   Intra-op Plan:   Post-operative Plan:   Informed Consent: I have reviewed the patients History and Physical, chart, labs and discussed the procedure including the risks, benefits and alternatives for the proposed anesthesia with the patient or authorized representative who has indicated his/her understanding and acceptance.   Dental Advisory Given  Plan Discussed with: CRNA and Surgeon  Anesthesia Plan Comments: (Labs checked- platelets confirmed with RN in room. Fetal heart tracing, per RN, reportedly stable enough for sitting procedure. Discussed epidural, and patient consents to the procedure:  included risk of possible headache,backache, failed block, allergic reaction, and nerve injury. This patient was asked if she had any questions or concerns before the procedure started. )        Anesthesia Quick Evaluation

## 2011-09-06 NOTE — Progress Notes (Signed)
Pt transferred to [facility], report given to [employer]. Charge Nurse

## 2011-09-06 NOTE — Progress Notes (Signed)
Pt states she is really hurting today.pt states she felt that she was tensing up while she was taking a nap today.

## 2011-09-06 NOTE — Progress Notes (Signed)
Rhonda Cabrera is a 25 y.o. [phone] at [redacted]w[redacted]d, admitted for SOL  Subjective: Doing well s/p epidural.  Pitocin on for less than 1 hour now  Objective: BP 106/72  Pulse 63  Temp(Src) 98.3 F (36.8 C) (Oral)  Resp 18  SpO2 100%  LMP 12/23/2010  Breastfeeding? Unknown  Fetal Heart Rate: 125 Variability: mod Accelerations: present Decelerations: absent  Contractions: Q4-6  SVE:   Dilation: 5 Effacement (%): 70 Station: -2 Exam by:: LCarpenter,RN  Pitocin: 1 unit per min Mag: NA   Assessment / Plan: 25 y.o. [id number] at [redacted]w[redacted]d here for SOL  Labor: Now with augmentation of labor for prolonged latent phase.  Pitocin just recently started.  Will recheck cervix in ~2hr and titrate pitocin as needed. Preeclampsia:  NA Fetal Wellbeing: Cat I Pain Control:  Epidural I/D:  NA  Tyus Kallam 09/06/2011, 9:45 PM

## 2011-09-06 NOTE — Progress Notes (Signed)
Rashidah Belleville Fitzgerald is a 25 y.o. [id number] at [redacted]w[redacted]d, admitted for SOL  Subjective: Doing well s/p 2 stadol.  Is planning epidural before discussing pitocin.  Objective: BP 117/76  Pulse 75  Temp(Src) 98.1 F (36.7 C) (Oral)  Resp 20  LMP 12/23/2010  Breastfeeding? Unknown  Fetal Heart Rate: 120 Variability: mod Accelerations: present Decelerations: absent  Contractions: Q6-8  SVE:   Dilation: 5 Effacement (%): 80 Station: Ballotable Exam by:: Rich  Pitocin: NA Mag: NA   Assessment / Plan: 25 y.o. [id number] at [redacted]w[redacted]d here for SOL  Labor: Minimal progress since 4pm. Will continue to monitor and plan AROM shortly. Preeclampsia:  NA Fetal Wellbeing: Cat I Pain Control:  IV PRN, epidural on request I/D:  NA  Deshia Vanderhoof 09/06/2011, 7:15 PM

## 2011-09-07 ENCOUNTER — Encounter (HOSPITAL_COMMUNITY): Payer: Self-pay | Admitting: *Deleted

## 2011-09-07 ENCOUNTER — Encounter: Payer: Medicaid Other | Admitting: Family Medicine

## 2011-09-07 LAB — URINE MICROSCOPIC-ADD ON

## 2011-09-07 LAB — URINALYSIS, ROUTINE W REFLEX MICROSCOPIC
Bilirubin Urine: NEGATIVE
Glucose, UA: NEGATIVE
Hgb urine dipstick: NEGATIVE
Ketones, ur: NEGATIVE
Nitrite: NEGATIVE
Protein, ur: NEGATIVE
Specific Gravity, Urine: 1.039 — ABNORMAL HIGH
Urobilinogen, UA: 1
pH: 6.5

## 2011-09-07 LAB — PREGNANCY, URINE: Preg Test, Ur: NEGATIVE

## 2011-09-07 LAB — GC/CHLAMYDIA PROBE AMP, GENITAL
Chlamydia, DNA Probe: NEGATIVE
GC Probe Amp, Genital: NEGATIVE

## 2011-09-07 LAB — WET PREP, GENITAL: Yeast Wet Prep HPF POC: NONE SEEN

## 2011-09-07 LAB — RPR: RPR Ser Ql: NONREACTIVE

## 2011-09-07 MED ORDER — SENNOSIDES-DOCUSATE SODIUM 8.6-50 MG PO TABS
2.0000 | ORAL_TABLET | Freq: Every day | ORAL | Status: DC
  Administered 2011-09-07 – 2011-09-08 (×2): 2 via ORAL

## 2011-09-07 MED ORDER — DIPHENHYDRAMINE HCL 25 MG PO CAPS: 25.0000 mg | ORAL_CAPSULE | Freq: Four times a day (QID) | ORAL | Status: DC | PRN

## 2011-09-07 MED ORDER — ONDANSETRON HCL 4 MG PO TABS: 4.0000 mg | ORAL_TABLET | ORAL | Status: DC | PRN

## 2011-09-07 MED ORDER — DIBUCAINE 1 % RE OINT: 1.0000 "application " | TOPICAL_OINTMENT | RECTAL | Status: DC | PRN

## 2011-09-07 MED ORDER — SIMETHICONE 80 MG PO CHEW: 80.0000 mg | CHEWABLE_TABLET | ORAL | Status: DC | PRN

## 2011-09-07 MED ORDER — BENZOCAINE-MENTHOL 20-0.5 % EX AERO
1.0000 "application " | INHALATION_SPRAY | CUTANEOUS | Status: DC | PRN
  Administered 2011-09-07 – 2011-09-09 (×2): 1 via TOPICAL

## 2011-09-07 MED ORDER — TETANUS-DIPHTH-ACELL PERTUSSIS 5-2.5-18.5 LF-MCG/0.5 IM SUSP
0.5000 mL | Freq: Once | INTRAMUSCULAR | Status: AC
  Administered 2011-09-08: 0.5 mL via INTRAMUSCULAR
  Filled 2011-09-07: qty 0.5

## 2011-09-07 MED ORDER — BENZOCAINE-MENTHOL 20-0.5 % EX AERO
INHALATION_SPRAY | CUTANEOUS | Status: AC
  Administered 2011-09-07: 1 via TOPICAL
  Filled 2011-09-07: qty 56

## 2011-09-07 MED ORDER — LANOLIN HYDROUS EX OINT: TOPICAL_OINTMENT | CUTANEOUS | Status: DC | PRN

## 2011-09-07 MED ORDER — LACTATED RINGERS IV SOLN: INTRAVENOUS | Status: DC

## 2011-09-07 MED ORDER — IBUPROFEN 600 MG PO TABS
600.0000 mg | ORAL_TABLET | Freq: Four times a day (QID) | ORAL | Status: DC
  Administered 2011-09-07 – 2011-09-09 (×9): 600 mg via ORAL
  Filled 2011-09-07 (×9): qty 1

## 2011-09-07 MED ORDER — OXYCODONE-ACETAMINOPHEN 5-325 MG PO TABS
1.0000 | ORAL_TABLET | ORAL | Status: DC | PRN
  Administered 2011-09-07 – 2011-09-09 (×10): 1 via ORAL
  Filled 2011-09-07 (×11): qty 1

## 2011-09-07 MED ORDER — WITCH HAZEL-GLYCERIN EX PADS: 1.0000 "application " | MEDICATED_PAD | CUTANEOUS | Status: DC | PRN

## 2011-09-07 MED ORDER — PRENATAL PLUS 27-1 MG PO TABS
1.0000 | ORAL_TABLET | Freq: Every day | ORAL | Status: DC
  Administered 2011-09-07 – 2011-09-09 (×3): 1 via ORAL
  Filled 2011-09-07 (×3): qty 1

## 2011-09-07 MED ORDER — ZOLPIDEM TARTRATE 5 MG PO TABS: 5.0000 mg | ORAL_TABLET | Freq: Every evening | ORAL | Status: DC | PRN

## 2011-09-07 MED ORDER — ONDANSETRON HCL 4 MG/2ML IJ SOLN: 4.0000 mg | INTRAMUSCULAR | Status: DC | PRN

## 2011-09-07 NOTE — Progress Notes (Signed)
Delivery Note At  a viable female was delivered via  (Presentation: Vertex ; OP ).  APGAR: 8 , 9 ; weight 6lb 8oz.   Placenta status: intact  Cord: 3 vessel with the following complications: first degree right labial.    Anesthesia:  Epidural and 10cc local Episiotomy: none Lacerations: first degree right labial Suture Repair: 4-0 vicril Est. Blood Loss (mL): 350cc  Mom to postpartum.  Baby to nursery-stable.  Koni Kannan 09/07/2011, 1:14 AM

## 2011-09-07 NOTE — Progress Notes (Signed)
PSYCHOSOCIAL ASSESSMENT ~ MATERNAL/CHILD  Name: Rhonda Cabrera Age: 25  Referral Date: 09/07/11  Reason/Source: hx Bipolar, Opiate Addiction, "DSS has first child"/CN  I. FAMILY/HOME ENVIRONMENT  A. Child's Legal Guardian _x__Parent(s) ___Grandparent ___Foster parent ___DSS_________________  Name: Rhonda Cabrera DOB: 01/20/1986 Age: 25  Address: [address] [place], [place] [address]  Name: Rhonda Cabrera DOB: // Age:  Address:same  B. Other Household Members/Support Persons There are four other children living in the home.  C. Other Support: good support system  II. PSYCHOSOCIAL DATA A. Information Source _x_Patient Interview __Family Interview _x_Other: chart B. Financial and Community Resources __Employment:  _x_Medicaid County: [place] __Private Insurance: __Self Pay  _x_Food Stamps _x_WIC __Work First __Public Housing __Section 8  __Maternity Care Coordination/Child Service Coordination/Early Intervention  __School: Grade:  __Other:  C. Cultural and Environment Information Cultural Issues Impacting Care: none known  III. STRENGTHS _x__Supportive family/friends  _x__Adequate Resources  _x__Compliance with medical plan  ___Home prepared for Child (including basic supplies)  _x__Understanding of illness  ___Other:  IV. RISK FACTORS AND CURRENT PROBLEMS ____No Problems Noted Pt Family Substance Abuse-MOB history ___ _x__ Mental Illness-MOB ___ _x__ Family/Relationship Issues ___ ___  Abuse/Neglect/Domestic Violence ___ ___  Financial Resources ___ ___  Transportation ___ ___  DSS Involvement ___ ___  Adjustment to Illness ___ ___  Knowledge/Cognitive Deficit ___ ___  Compliance with Treatment ___ ___  Basic Needs (food, housing, etc.) ___ ___  Housing Concerns ___ ___  Other_____________________________________________________________  V. SOCIAL WORK ASSESSMENT SW met with MOB in her first floor room to complete assessment. SW discussed how she is feeling physically,  emotionally and mentally. MOB seemed very open with SW. She states she has been "crampy," but the nurse just gave her something for pain. SW asked her what she has been taking and if it has been working. She states she was given Motrin and Percocet. SW asked her about her "Opiate addiction" as noted in her chart. She states that she had a pain pill addiction in the past and received treatment from the [facility]. She states she graduated from their program and has been clean for 5 months. She states she regularly attends NA meetings and has a sponsor. SW encouraged her to continue with the meetings and sponsor. She said she definitely is planning to. SW talked very openly about the danger of taking Percocet given her hx. She states she is aware of this and will only ask for it if she absolutely needs it for pain. SW recommended she try just Motrin first. She agreed. SW asked her about her Bipolar disorder. She states she receives treatment for this from the [facility] as well and plans to get back on her medication after breastfeeding. SW stressed the importance of taking care of herself in order to take care of her children and although breastfeeding is very important, her mental health is more important. She agreed. MOB states she took Zoloft, Vistaril, and Abilify prior to pregnancy and plans to get back on these medications after breastfeeding. SW suggested she speak to her psychiatrist as well as a lactation consultant from the hospital to discuss starting back on something that is safe to take while breastfeeding, such as Zoloft for example. She states she will do so. She will make an appointment with the [facility] now that baby has been born. SW asked about her other children and she volunteered that this is her 6th child and that 4 of them live in the home and her oldest lives   with her aunt. SW asked why and she said that her aunt adopted him because MOB was 15 and [place] when he was born.  MOB is married to FOB and he is involved and supportive. SW asked if MOB has necessary baby items and she said that she has people working on it, although she doesn't have the supplies yet. SW offered to get her a bundle pack and she accepted and was very appreciative. She told SW that her doctor told her that a social worker would be coming to see her because of her history of Opiate use and asked if that is what this visit was. SW said yes and that CPS would only need to get involved if the baby's drug screens were positive for an illegal substance or for something she did not have a prescription for. She states she is not worried about this, although she was given Stadol during labor and is breastfeeding now while taking Percocet. SW told her we would take this into account.  VI. SOCIAL WORK PLAN _x__No Further Intervention Required/No Barriers to Discharge  ___Psychosocial Support and Ongoing Assessment of Needs  ___Patient/Family Education:  ___Child Protective Services Report County___________ Date___/____/____  ___Information/Referral to Community Resources_________________________  ___Other:    Transportation                                                                        ___               ___  DSS Involvement                                                                   ___              ___  Adjustment to Illness                                                               ___              ___  Knowledge/Cognitive Deficit                                                   ___              ___             Compliance with Treatment                                                 ___              ___  Basic Needs (food, housing, etc.)                                          ___              ___             Housing Concerns                                       ___              ___ Other_____________________________________________________________            V. SOCIAL WORK ASSESSMENT SW met with MOB in her first floor room to complete assessment.  SW discussed how she is feeling physically, emotionally and mentally.  MOB seemed very open with SW.  She states she has been "crampy," but the nurse just gave her something for pain.  SW asked her what she has been taking and  if it has been working.  She states she was given Motrin and Percocet.  SW asked her about her "Opiate addiction" as noted in her chart.  She states that she had a pain pill addiction in the past and received treatment from the [facility].  She  states she graduated from their program and has been clean for 5 months.  She states she regularly attends NA meetings and has a sponsor.  SW encouraged her to continue with the meetings and sponsor.  She said she definitely is planning to.  SW talked very openly about the danger of taking Percocet given her hx.  She states she is aware of this and will only ask for it if she absolutely needs it for pain.  SW recommended she try just Motrin first.  She agreed.  SW asked her about her Bipolar disorder.  She states she receives treatment for this from the [facility] as well and plans to get back on her medication after breastfeeding.  SW stressed the importance of taking care of herself in order to take care of her children and although breastfeeding is very important, her mental health is more important.  She agreed.  MOB states she took Zoloft, Vistaril, and Abilify prior to pregnancy and plans to get back on these medications after breastfeeding.  SW suggested she speak to her psychiatrist as well as a Advertising copywriter from the hospital to discuss starting back on something that is safe to take while breastfeeding, such as Zoloft for example.  She states she will do so.  She will make an appointment with the [facility] now that baby has been born.  SW asked about her other children and she volunteered that this is her 6th child and that 4 of them live in the home and her oldest lives with her aunt.  SW asked why and she said that her aunt adopted him because MOB was 54 and in [facility] when he was born.  MOB is married to FOB and he is involved and supportive.  SW asked if MOB has necessary baby items and she said that she has people working on it, although she doesn't have the supplies yet.  SW offered to get her a bundle pack and she accepted and was very Adult nurse.  She told SW that her doctor told her that a Child psychotherapist would be coming to see her because of her history of Opiate use and asked  if that is what this visit was.  SW said yes and that CPS would only need to get involved if the baby's drug screens were positive for an illegal substance or for something she did not have a prescription for.  She states she is not worried about this, although she was given Stadol during labor and is breastfeeding now while taking Percocet.  SW told her we would take this into account.    VI. SOCIAL WORK PLAN  _x__No Further Intervention Required/No Barriers to Discharge   ___Psychosocial Support and Ongoing Assessment of Needs   ___Patient/Family Education:   ___Child Protective Services Report   County___________ Date___/____/____   ___Information/Referral to [employer]_________________________   ___Other:

## 2011-09-07 NOTE — Progress Notes (Signed)
UR chart review completed.  

## 2011-09-07 NOTE — Progress Notes (Signed)
Pt refusing to push.

## 2011-09-08 ENCOUNTER — Inpatient Hospital Stay (HOSPITAL_COMMUNITY): Payer: Medicaid Other | Admitting: Anesthesiology

## 2011-09-08 ENCOUNTER — Encounter (HOSPITAL_COMMUNITY): Payer: Self-pay | Admitting: Anesthesiology

## 2011-09-08 ENCOUNTER — Encounter (HOSPITAL_COMMUNITY)
Admission: AD | Disposition: A | Discharge status: Home / Self Care | Payer: Self-pay | Source: Ambulatory Visit | Attending: Obstetrics & Gynecology

## 2011-09-08 DIAGNOSIS — Z302 Encounter for sterilization: Secondary | ICD-10-CM

## 2011-09-08 HISTORY — PX: TUBAL LIGATION: SHX77

## 2011-09-08 SURGERY — LIGATION, FALLOPIAN TUBE, POSTPARTUM
Anesthesia: Choice | Laterality: Bilateral | Wound class: Clean Contaminated

## 2011-09-08 MED ORDER — SODIUM BICARBONATE 8.4 % IV SOLN
INTRAVENOUS | Status: AC
  Filled 2011-09-08: qty 50

## 2011-09-08 MED ORDER — MIDAZOLAM HCL 5 MG/5ML IJ SOLN
INTRAMUSCULAR | Status: DC | PRN
  Administered 2011-09-08: 2 mg via INTRAVENOUS

## 2011-09-08 MED ORDER — LIDOCAINE-EPINEPHRINE (PF) 2 %-1:200000 IJ SOLN
INTRAMUSCULAR | Status: AC
  Filled 2011-09-08: qty 20

## 2011-09-08 MED ORDER — FENTANYL CITRATE 0.05 MG/ML IJ SOLN
INTRAMUSCULAR | Status: AC
  Filled 2011-09-08: qty 2

## 2011-09-08 MED ORDER — METOCLOPRAMIDE HCL 10 MG PO TABS: 10.0000 mg | ORAL_TABLET | Freq: Once | ORAL | Status: DC

## 2011-09-08 MED ORDER — PROPOFOL 10 MG/ML IV EMUL
INTRAVENOUS | Status: DC | PRN
  Administered 2011-09-08: 150 mL via INTRAVENOUS
  Administered 2011-09-08: 50 mL via INTRAVENOUS

## 2011-09-08 MED ORDER — KETOROLAC TROMETHAMINE 30 MG/ML IJ SOLN: 15.0000 mg | Freq: Once | INTRAMUSCULAR | Status: DC | PRN

## 2011-09-08 MED ORDER — MEPERIDINE HCL 25 MG/ML IJ SOLN: 6.2500 mg | INTRAMUSCULAR | Status: DC | PRN

## 2011-09-08 MED ORDER — BUPIVACAINE HCL (PF) 0.25 % IJ SOLN
INTRAMUSCULAR | Status: DC | PRN
  Administered 2011-09-08: 10 mL

## 2011-09-08 MED ORDER — ROCURONIUM BROMIDE 50 MG/5ML IV SOLN
INTRAVENOUS | Status: AC
  Filled 2011-09-08: qty 1

## 2011-09-08 MED ORDER — LACTATED RINGERS IV SOLN
INTRAVENOUS | Status: DC | PRN
  Administered 2011-09-08 (×2): via INTRAVENOUS

## 2011-09-08 MED ORDER — SODIUM BICARBONATE 8.4 % IV SOLN
INTRAVENOUS | Status: DC | PRN
  Administered 2011-09-08: 2 mL via EPIDURAL

## 2011-09-08 MED ORDER — LACTATED RINGERS IV SOLN
INTRAVENOUS | Status: DC
  Administered 2011-09-08: 20 mL/h via INTRAVENOUS

## 2011-09-08 MED ORDER — FENTANYL CITRATE 0.05 MG/ML IJ SOLN
INTRAMUSCULAR | Status: DC | PRN
  Administered 2011-09-08: 100 ug via INTRAVENOUS

## 2011-09-08 MED ORDER — PROPOFOL 10 MG/ML IV EMUL
INTRAVENOUS | Status: AC
  Filled 2011-09-08: qty 20

## 2011-09-08 MED ORDER — FAMOTIDINE 20 MG PO TABS: 40.0000 mg | ORAL_TABLET | Freq: Once | ORAL | Status: DC

## 2011-09-08 MED ORDER — ONDANSETRON HCL 4 MG/2ML IJ SOLN: 4.0000 mg | Freq: Once | INTRAMUSCULAR | Status: DC | PRN

## 2011-09-08 MED ORDER — LACTATED RINGERS IV SOLN: INTRAVENOUS | Status: DC

## 2011-09-08 MED ORDER — MIDAZOLAM HCL 2 MG/2ML IJ SOLN
INTRAMUSCULAR | Status: AC
  Filled 2011-09-08: qty 2

## 2011-09-08 MED ORDER — FAMOTIDINE 20 MG PO TABS
40.0000 mg | ORAL_TABLET | Freq: Once | ORAL | Status: AC
  Administered 2011-09-08: 40 mg via ORAL
  Filled 2011-09-08: qty 2

## 2011-09-08 MED ORDER — SUCCINYLCHOLINE CHLORIDE 20 MG/ML IJ SOLN
INTRAMUSCULAR | Status: AC
  Filled 2011-09-08: qty 1

## 2011-09-08 MED ORDER — LIDOCAINE HCL (CARDIAC) 20 MG/ML IV SOLN
INTRAVENOUS | Status: AC
  Filled 2011-09-08: qty 5

## 2011-09-08 MED ORDER — METOCLOPRAMIDE HCL 10 MG PO TABS
10.0000 mg | ORAL_TABLET | Freq: Once | ORAL | Status: AC
  Administered 2011-09-08: 10 mg via ORAL
  Filled 2011-09-08: qty 1

## 2011-09-08 MED ORDER — HYDROMORPHONE HCL 1 MG/ML IJ SOLN: 0.2500 mg | INTRAMUSCULAR | Status: DC | PRN

## 2011-09-08 SURGICAL SUPPLY — 22 items
BLADE SURG 11 STRL SS (BLADE) ×2 IMPLANT
CATH ROBINSON RED A/P 16FR (CATHETERS) ×2 IMPLANT
CHLORAPREP W/TINT 26ML (MISCELLANEOUS) ×2 IMPLANT
CLIP FILSHIE TUBAL LIGA STRL (Clip) ×2 IMPLANT
CLOTH BEACON ORANGE TIMEOUT ST (SAFETY) ×2 IMPLANT
DRAPE UTILITY XL STRL (DRAPES) ×2 IMPLANT
DRSG COVADERM PLUS 2X2 (GAUZE/BANDAGES/DRESSINGS) ×1 IMPLANT
GLOVE BIO SURGEON STRL SZ 6.5 (GLOVE) ×2 IMPLANT
GLOVE BIOGEL PI IND STRL 7.0 (GLOVE) ×2 IMPLANT
GLOVE BIOGEL PI INDICATOR 7.0 (GLOVE) ×2
GOWN PREVENTION PLUS LG XLONG (DISPOSABLE) ×4 IMPLANT
NEEDLE HYPO 25X1 1.5 SAFETY (NEEDLE) IMPLANT
NS IRRIG 1000ML POUR BTL (IV SOLUTION) ×2 IMPLANT
PACK ABDOMINAL MINOR (CUSTOM PROCEDURE TRAY) ×2 IMPLANT
SPONGE LAP 4X18 X RAY DECT (DISPOSABLE) IMPLANT
SUT VIC AB 0 CT1 27 (SUTURE) ×2
SUT VIC AB 0 CT1 27XBRD ANBCTR (SUTURE) ×1 IMPLANT
SUT VIC AB 4-0 PS2 18 (SUTURE) ×2 IMPLANT
SYR CONTROL 10ML LL (SYRINGE) IMPLANT
TOWEL OR 17X24 6PK STRL BLUE (TOWEL DISPOSABLE) ×4 IMPLANT
TRAY FOLEY BAG SILVER LF 14FR (CATHETERS) ×2 IMPLANT
WATER STERILE IRR 1000ML POUR (IV SOLUTION) ×2 IMPLANT

## 2011-09-08 NOTE — Addendum Note (Signed)
Addendum  created 09/08/11 1324 by Randa Spike   Modules edited:Anesthesia Events, Anesthesia Flowsheet, Anesthesia LDA, Anesthesia Medication Administration, Anesthesia Responsible Staff, Charges VN, Charting, Inpatient Notes, Notes Section

## 2011-09-08 NOTE — Addendum Note (Signed)
Addendum  created 09/08/11 1248 by Sandrea Hughs.   Modules edited:Orders, PRL Based Order Sets

## 2011-09-08 NOTE — Addendum Note (Signed)
Addendum  created 09/08/11 0829 by Fanny Dance   Modules edited:Notes Section

## 2011-09-08 NOTE — Addendum Note (Signed)
Addendum  created 09/08/11 1324 by Irfan Veal D Kalika Smay   Modules edited:Anesthesia Events, Anesthesia Flowsheet, Anesthesia LDA, Anesthesia Medication Administration, Anesthesia Responsible Staff, Charges VN, Charting, Inpatient Notes, Notes Section    

## 2011-09-08 NOTE — Addendum Note (Signed)
Addendum  created 09/08/11 1327 by Randa Spike   Modules edited:Anesthesia Events, Anesthesia Flowsheet, Anesthesia LDA, Anesthesia Medication Administration, Anesthesia Responsible Staff, Charges VN, Charting, Inpatient Notes, Notes Section

## 2011-09-08 NOTE — Addendum Note (Signed)
Addendum  created 09/08/11 1248 by Sandrea Hughs.   Modules edited:Anesthesia Attestations, Anesthesia Responsible Staff, Orders, PRL Based Order Sets

## 2011-09-08 NOTE — Anesthesia Postprocedure Evaluation (Signed)
  Anesthesia Post-op Note  Patient: Rhonda Cabrera  Procedure(s) Performed:  POST PARTUM TUBAL LIGATION  Patient Location: PACU and Mother/Baby  Anesthesia Type: Epidural  Level of Consciousness: awake, alert  and oriented  Airway and Oxygen Therapy: Patient Spontanous Breathing  Post-op Pain: mild  Post-op Assessment: Patient's Cardiovascular Status Stable and Respiratory Function Stable  Post-op Vital Signs: stable  Complications: No apparent anesthesia complications

## 2011-09-08 NOTE — Addendum Note (Signed)
Addendum  created 09/08/11 0829 by Di Jasmer   Modules edited:Notes Section    

## 2011-09-08 NOTE — Addendum Note (Signed)
Addendum  created 09/08/11 1151 by Randa Spike   Modules edited:Anesthesia Events, Anesthesia Flowsheet, Anesthesia Medication Administration

## 2011-09-08 NOTE — Op Note (Signed)
PreProcedure Diagnosis: Undesired Fertility Post Procedure Diagnosis: Undesired Fertility  Procedure: Bilateral Tubal Ligation with Filshie Clips  Surgeon: Dr Scheryl Darter Assistants: Dr Lucina Mellow  Estimated Blood Loss: <2mL Specimen: none  Indication: 25-yo [id number] status post NSVD with undesired fertility wishes to have postpartum bilateral tubal ligation. Informed consent was obtained, and patient has her 30-day consent papers on her hard chart.  Findings: Normal fundus, tubes and ovaries.  Procedure: The patient was taken to the operating room where a time out was performed and spinal anesthesia was found to be inadequate. Dr Lilli Light was called to the room and she was put to sleep with general anesthesia. She was prepared and draped in the normal sterile fashion. 10mL of marcaine was injected in the infraumbilical region. A small transverse, infraumbilical skin incision was then made with the scalpel. This incision was carried down through the underlying fascia until the peritoneum was identified and entered. The peritoneum was noted to be free of any adhesions and the incision was extended with the Metzenbaum scissors.  The patient's Left fallopian tube was then identified and brought the the incision, grasped with a Babcock clamp and followed out the fimbria. Two Babcock clamps were used to isolate the tube and a Filshie clip was placed between them. In similar fashion, the patient's Right fallopian tube was then identified and followed out to the fimbria with the Babcock clamps, and a Filshie clip was placed. Hemostasis was noted and the tubes were returned to the abdomen.  The fascia was close with 0-vicryl stitch in a running fashion. The skin was closed with 4-0 vicryl. Counts were correct x 3. The Patient tolerated the procedure well and was taken to the PACU in stable condition.

## 2011-09-08 NOTE — Transfer of Care (Signed)
Immediate Anesthesia Transfer of Care Note  Patient: Rhonda Cabrera  Procedure(s) Performed:  POST PARTUM TUBAL LIGATION  Patient Location: PACU  Anesthesia Type: General  Level of Consciousness: sedated and patient cooperative  Airway & Oxygen Therapy: Patient connected to nasal cannula oxygen  Post-op Assessment: Report given to PACU RN  Post vital signs: Reviewed and stable  Complications: No apparent anesthesia complications

## 2011-09-08 NOTE — Progress Notes (Addendum)
Post Partum Day 1 Subjective: no complaints, up ad lib, voiding, tolerating PO and + flatus  Objective: Blood pressure 111/73, pulse 64, temperature 98.1 F (36.7 C), temperature source Oral, resp. rate 18, last menstrual period 12/23/2010, SpO2 100.00%, unknown if currently breastfeeding.  Physical Exam:  General: alert and cooperative Lochia: appropriate Uterine Fundus: firm Incision: no incision DVT Evaluation: No evidence of DVT seen on physical exam.   Basename 09/06/11 1649  HGB 11.9*  HCT 35.6*    Assessment/Plan: Plan for discharge tomorrow, Breastfeeding and Contraception BTL today   LOS: 2 days   RITCH,ERIK 09/08/2011, 8:39 AM   Agree with above note,pt scheduled for tubal ligation. The procedure and the risk of anesthesia, bleeding, infection, failure, ectopic, bowel and urinary tract damage were discussed and her questions were answered. Consent signed on chart. Raneshia Derick 09/08/2011

## 2011-09-09 MED ORDER — BENZOCAINE-MENTHOL 20-0.5 % EX AERO: 1.0000 "application " | INHALATION_SPRAY | CUTANEOUS | Status: DC | PRN

## 2011-09-09 MED ORDER — IBUPROFEN 600 MG PO TABS: 600.0000 mg | ORAL_TABLET | Freq: Four times a day (QID) | ORAL | Status: AC

## 2011-09-09 MED ORDER — OXYCODONE-ACETAMINOPHEN 5-325 MG PO TABS: 1.0000 | ORAL_TABLET | ORAL | Status: DC

## 2011-09-09 MED ORDER — BENZOCAINE-MENTHOL 20-0.5 % EX AERO
INHALATION_SPRAY | CUTANEOUS | Status: AC
  Administered 2011-09-09: 1 via TOPICAL
  Filled 2011-09-09: qty 56

## 2011-09-09 NOTE — Discharge Summary (Signed)
Obstetric Discharge Summary Reason for Admission: onset of labor Prenatal Procedures: none Intrapartum Procedures: spontaneous vaginal delivery Postpartum Procedures: P.P. tubal ligation Complications-Operative and Postpartum: right labial laceration Hemoglobin  Date Value Range Status  09/06/2011 11.9* 12.0-15.0 (g/dL) Final     HCT  Date Value Range Status  09/06/2011 35.6* 36.0-46.0 (%) Final    Discharge Diagnoses: Premature labor and preterm delivery  Discharge Information: Date: 09/09/2011 Activity: pelvic rest Diet: routine Medications: Ibuprophen and Percocet Condition: stable Instructions: refer to practice specific booklet Discharge to: home  The patient has a known history of opioid abuse. She had multiple negative urine drug screens during her pregnancy, owling going through drug rehabilitation at the beginning of her pregnancy. The patient was seen by social work following delivery, and the Child psychotherapist does not feel that there were any issues that needed to be addressed at this time. As the patient did have a tubal ligation, she was discharged with a small number of Percocet for postoperative pain. She was informed that, if she was still having pain once she was up out of these, she would need an appointment for any additional pain medication. The patient's hospital course was otherwise unremarkable.  Follow-up Information    Follow up with Majel Homer, MD. Make an appointment in 6 weeks. (postpartum check)    Contact information:   [address] [place] [address] [phone]          Newborn Data: Live born female  Birth Weight: 6 lb 8.2 oz (2954 g) APGAR: 8, 9  Home with mother.  Rhonda Cabrera 09/09/2011, 10:29 AM

## 2011-09-09 NOTE — Progress Notes (Signed)
Post Partum Day 2 Subjective: is having some pain at incision site and in lower abdomon.  Light lochia, no clots.  Ambulating and tolerating PO.  Objective: Blood pressure 110/74, pulse 67, temperature 98.3 F (36.8 C), temperature source Oral, resp. rate 18, last menstrual period 12/23/2010, SpO2 95.00%, currently breastfeeding.  Physical Exam:  General: alert and cooperative Lochia: appropriate Uterine Fundus: firm Incision: healing well, no significant drainage, no dehiscence, no significant erythema DVT Evaluation: No evidence of DVT seen on physical exam.   Basename 09/06/11 1649  HGB 11.9*  HCT 35.6*    Assessment/Plan: Discharge home, Breastfeeding and Contraception s/p BTL.  Will give enough percocet for the next ~5 days.  If pt requires more, will need appointment with [facility].   LOS: 3 days   Sanyla Summey 09/09/2011, 8:07 AM

## 2011-09-11 ENCOUNTER — Encounter (HOSPITAL_COMMUNITY): Payer: Self-pay | Admitting: Obstetrics & Gynecology

## 2011-09-11 LAB — URINE MICROSCOPIC-ADD ON

## 2011-09-11 LAB — COMPREHENSIVE METABOLIC PANEL
Albumin: 4.4
Alkaline Phosphatase: 58
BUN: 11
Calcium: 9.3
Potassium: 3.3 — ABNORMAL LOW
Total Protein: 7.3

## 2011-09-11 LAB — DIFFERENTIAL
Lymphocytes Relative: 21
Lymphs Abs: 1.3
Monocytes Absolute: 0.3
Monocytes Relative: 5
Neutro Abs: 4.6

## 2011-09-11 LAB — POCT PREGNANCY, URINE: Operator id: 294511

## 2011-09-11 LAB — URINALYSIS, ROUTINE W REFLEX MICROSCOPIC
Bilirubin Urine: NEGATIVE
Glucose, UA: NEGATIVE
Hgb urine dipstick: NEGATIVE
Specific Gravity, Urine: 1.016
pH: 6

## 2011-09-11 LAB — CBC
HCT: 39
Platelets: 328
RDW: 13.4

## 2011-09-11 LAB — RAPID URINE DRUG SCREEN, HOSP PERFORMED
Cocaine: NOT DETECTED
Opiates: NOT DETECTED

## 2011-09-11 LAB — ETHANOL: Alcohol, Ethyl (B): 56 — ABNORMAL HIGH

## 2011-09-15 ENCOUNTER — Telehealth: Payer: Self-pay | Admitting: Family Medicine

## 2011-09-15 ENCOUNTER — Encounter: Payer: Self-pay | Admitting: Family Medicine

## 2011-09-15 ENCOUNTER — Ambulatory Visit (INDEPENDENT_AMBULATORY_CARE_PROVIDER_SITE_OTHER): Payer: Medicaid Other | Admitting: Family Medicine

## 2011-09-15 DIAGNOSIS — G43909 Migraine, unspecified, not intractable, without status migrainosus: Secondary | ICD-10-CM

## 2011-09-15 DIAGNOSIS — G43709 Chronic migraine without aura, not intractable, without status migrainosus: Secondary | ICD-10-CM | POA: Insufficient documentation

## 2011-09-15 DIAGNOSIS — R030 Elevated blood-pressure reading, without diagnosis of hypertension: Secondary | ICD-10-CM | POA: Insufficient documentation

## 2011-09-15 MED ORDER — KETOROLAC TROMETHAMINE 30 MG/ML IJ SOLN: 30.0000 mg | Freq: Once | INTRAMUSCULAR | Status: DC

## 2011-09-15 NOTE — Assessment & Plan Note (Signed)
Her blood pressure was normal today at clinic. Her last blood pressure readings in the flowsheet are all WNL as well. Will have her follow up with her PCP in 2 weeks to recheck her blood pressure.

## 2011-09-15 NOTE — Assessment & Plan Note (Signed)
Patient complains of low grade migraine today with muscle pain in her neck. Toradol 30 mg IM x 1 in clinic today.

## 2011-09-15 NOTE — Telephone Encounter (Signed)
Kandis Fantasia called to inform that Ms. Lochridge is post partum and wanted to inform pt's pcp of elevated bp.  She told me that she took the pt's bp and in the L arm her reading was 140/76 and in the Right it was 160/78, she also informed me of pt having headaches and bilateral edema in both legs.   I called pt and told her that she will need to come in to be seen today. I told her that she will need to leave now (2:44 pm) so that we can get her worked in to be seen by Dr. Rivka Safer.  Spoke with Bartholome Bill. And she agreed that this would be best. .Heath Gold

## 2011-09-15 NOTE — Progress Notes (Signed)
  Subjective:    Patient ID: Rhonda Cabrera, female    DOB: 10/22/1986, 25 y.o.   MRN: [id number]  HPI 1. Blood pressure elevation Patient worried about her BP. Was apparantely elevated at home when she was checked by her visiting nurse team. Her pressure is normal now,  2. Migraine Has a low grade headache today without aura or associated symptoms. She has tried tylenol and ibuprofen without relief at home.   Review of Systems  Constitutional: Negative for fever, chills, diaphoresis, activity change and fatigue.  HENT: Negative for neck pain.   Eyes: Negative for photophobia and visual disturbance.  Respiratory: Negative for shortness of breath.   Cardiovascular: Negative for chest pain, palpitations and leg swelling.  Gastrointestinal: Negative for abdominal pain.  Musculoskeletal: Negative for back pain.  Neurological: Positive for headaches. Negative for dizziness, tremors, weakness and light-headedness.       Objective:   Physical Exam  Nursing note and vitals reviewed. Constitutional: She is oriented to person, place, and time. She appears well-developed and well-nourished. No distress.  HENT:  Head: Normocephalic and atraumatic.  Mouth/Throat: No oropharyngeal exudate.  Cardiovascular: Normal rate and regular rhythm.   Pulmonary/Chest: Effort normal and breath sounds normal. No respiratory distress. She has no wheezes.  Neurological: She is alert and oriented to person, place, and time. She displays normal reflexes. No cranial nerve deficit. Coordination normal.  Skin: She is not diaphoretic.      Assessment & Plan:

## 2011-09-15 NOTE — Patient Instructions (Signed)
Follow up with Dr. Louanne Belton for your Migraines. Please return to the clinic in two weeks to have your BP checked. It is normal today.

## 2011-09-18 ENCOUNTER — Encounter: Payer: Self-pay | Admitting: Family Medicine

## 2011-09-18 ENCOUNTER — Ambulatory Visit (INDEPENDENT_AMBULATORY_CARE_PROVIDER_SITE_OTHER): Payer: Medicaid Other | Admitting: Family Medicine

## 2011-09-18 DIAGNOSIS — R030 Elevated blood-pressure reading, without diagnosis of hypertension: Secondary | ICD-10-CM

## 2011-09-18 DIAGNOSIS — G43909 Migraine, unspecified, not intractable, without status migrainosus: Secondary | ICD-10-CM

## 2011-09-18 LAB — WET PREP, GENITAL: Yeast Wet Prep HPF POC: NONE SEEN

## 2011-09-18 LAB — URINALYSIS, ROUTINE W REFLEX MICROSCOPIC
Glucose, UA: NEGATIVE
Hgb urine dipstick: NEGATIVE
Specific Gravity, Urine: 1.026
Urobilinogen, UA: 1

## 2011-09-18 LAB — POCT PREGNANCY, URINE: Preg Test, Ur: NEGATIVE

## 2011-09-18 MED ORDER — CYCLOBENZAPRINE HCL 5 MG PO TABS: 5.0000 mg | ORAL_TABLET | Freq: Three times a day (TID) | ORAL | Status: AC | PRN

## 2011-09-18 MED ORDER — HYDROCHLOROTHIAZIDE 25 MG PO TABS: 25.0000 mg | ORAL_TABLET | Freq: Every day | ORAL | Status: DC

## 2011-09-18 NOTE — Patient Instructions (Signed)
I Sent HCTZ to your pharmacy for blood pressure   I sent flexeril for your neck pain  Come back in 2-3 days if you are still having pain  Please come back in 1 week for blood pressure recheck

## 2011-09-18 NOTE — Assessment & Plan Note (Signed)
BP elevated today.  Discussed pt with OB fellow.  We decided that if U protein was negative, pt did not need further preeclampsia w/u and could be started on an antihypertensive.  U protein was negative.  I started HCTZ.  Pt to f/u in one week.  Gave red flags for preeclampsia

## 2011-09-18 NOTE — Assessment & Plan Note (Signed)
i think this is more likely muscle strain and spasm due to labor/surgery and i gave flexeril and asked pt to take motrin 400 TID for 3 days and use heat.  Pt to RTC if no better in 3 days.

## 2011-09-18 NOTE — Progress Notes (Signed)
  Subjective:    Patient ID: Rhonda Cabrera, female    DOB: 1986-09-05, 25 y.o.   MRN: [id number]  HPI HTN- pt was noted to have high BP on Friday with babylove nurse.  Pt came here and was treated with toradol for HA since BP was WNL at that visit.  She denies vision changes, RUQ pain, or extreme LE swelling.  No HTN during pregnancy but pt does have a family hx.    HA- neck pain that radiates to head.  Started in hospital and seems to be getting worse.  Pain with neck movt and with lying on that side.  No radiation into arm, no numbness, weakness in arm.  No vision change as above.     Review of Systems     Objective:   Physical Exam Vital signs reviewed General appearance - alert, well appearing, and in no distress and oriented to person, place, and time Neurological - alert, oriented, normal speech, no focal findings or movement disorder noted, screening mental status exam normal, neck supple without rigidity, cranial nerves II through XII intact Neck- point tenderness along cervical spinous muscles, no tenderness over processes.  Neg spurlings.         Assessment & Plan:

## 2011-09-19 LAB — URINALYSIS, ROUTINE W REFLEX MICROSCOPIC
Glucose, UA: NEGATIVE
Ketones, ur: NEGATIVE
Nitrite: NEGATIVE
Protein, ur: NEGATIVE
Specific Gravity, Urine: 1.025
pH: 6.5

## 2011-09-19 LAB — POCT PREGNANCY, URINE
Preg Test, Ur: POSITIVE
Preg Test, Ur: POSITIVE

## 2011-09-19 LAB — WET PREP, GENITAL: Yeast Wet Prep HPF POC: NONE SEEN

## 2011-09-19 LAB — GC/CHLAMYDIA PROBE AMP, GENITAL: Chlamydia, DNA Probe: NEGATIVE

## 2011-09-19 LAB — URINE MICROSCOPIC-ADD ON

## 2011-09-19 LAB — HCG, QUANTITATIVE, PREGNANCY: hCG, Beta Chain, Quant, S: 106 — ABNORMAL HIGH

## 2011-09-22 ENCOUNTER — Encounter (HOSPITAL_COMMUNITY): Payer: Self-pay

## 2011-10-03 LAB — WET PREP, GENITAL
Clue Cells Wet Prep HPF POC: NONE SEEN
Trich, Wet Prep: NONE SEEN
WBC, Wet Prep HPF POC: NONE SEEN

## 2011-10-24 ENCOUNTER — Ambulatory Visit: Payer: Medicaid Other | Admitting: Family Medicine

## 2011-11-16 ENCOUNTER — Encounter: Payer: Self-pay | Admitting: Family Medicine

## 2011-11-16 ENCOUNTER — Ambulatory Visit (INDEPENDENT_AMBULATORY_CARE_PROVIDER_SITE_OTHER): Payer: Medicaid Other | Admitting: Family Medicine

## 2011-11-16 VITALS — BP 114/75 | HR 71 | Temp 99.0°F | Ht 67.0 in | Wt 133.4 lb

## 2011-11-16 DIAGNOSIS — N898 Other specified noninflammatory disorders of vagina: Secondary | ICD-10-CM | POA: Insufficient documentation

## 2011-11-16 DIAGNOSIS — R300 Dysuria: Secondary | ICD-10-CM

## 2011-11-16 DIAGNOSIS — N760 Acute vaginitis: Secondary | ICD-10-CM

## 2011-11-16 DIAGNOSIS — Z202 Contact with and (suspected) exposure to infections with a predominantly sexual mode of transmission: Secondary | ICD-10-CM

## 2011-11-16 LAB — POCT UA - MICROSCOPIC ONLY: WBC, Ur, HPF, POC: <5

## 2011-11-16 LAB — POCT URINALYSIS DIPSTICK
Blood, UA: NEGATIVE
Glucose, UA: NEGATIVE
Ketones, UA: NEGATIVE
Nitrite, UA: NEGATIVE
Protein, UA: 30
Spec Grav, UA: >=1.03
Urobilinogen, UA: 1
pH, UA: 6

## 2011-11-16 LAB — POCT WET PREP (WET MOUNT)
Trichomonas Wet Prep HPF POC: NEGATIVE
Yeast Wet Prep HPF POC: NEGATIVE

## 2011-11-16 LAB — HIV ANTIBODY (ROUTINE TESTING W REFLEX): HIV: NONREACTIVE

## 2011-11-16 NOTE — Assessment & Plan Note (Signed)
Discharge likely secondary to UTI versus PID. Order a urinalysis, wet prep, GC and Chlamydia. Will call patient with results in 48 hours.

## 2011-11-16 NOTE — Progress Notes (Signed)
  Subjective:    Patient ID: Rhonda Cabrera, female    DOB: 1986-11-04, 25 y.o.   MRN: [id number]  HPI  Patient presents to clinic for STD check. Patient had unprotected sex with a different partner about 3 weeks ago. She then had sexual intercourse with her husband. She complains of vaginal discharge and a foul odor in her urine. She also has associated pelvic pain. She denies any nausea or vomiting, fever, chills, night sweats. She denies any abdominal pain, decreased appetite, vaginal bleeding. She had her tubes tied after her son was born. Patient would like to get checked for bacterial infections, STDs, HIV.   Review of Systems Per history of present illness    Objective:   Physical Exam  Constitutional: She appears well-nourished. No distress.  Abdominal: Soft. Bowel sounds are normal. She exhibits no distension. There is no tenderness.  Genitourinary: There is no rash, tenderness, lesion or injury on the right labia. There is no rash, tenderness, lesion or injury on the left labia. Cervix exhibits discharge. Cervix exhibits no motion tenderness and no friability. Right adnexum displays no mass, no tenderness and no fullness. Left adnexum displays no tenderness. No erythema, tenderness or bleeding around the vagina. No vaginal discharge found.          Assessment & Plan:

## 2011-11-16 NOTE — Patient Instructions (Addendum)
It was nice to meet you.  I will call you with the results of your recent lab work.  If symptoms worsen or pain persists, please return to clinic. Please schedule a followup appointment with your PCP as needed.

## 2011-11-16 NOTE — Assessment & Plan Note (Signed)
Will order HIV testing today. Patient had a recent RPR lab in June-will not repeat today. Will call patient with results in 2 days.

## 2011-11-20 ENCOUNTER — Other Ambulatory Visit: Payer: Self-pay | Admitting: Family Medicine

## 2011-11-20 MED ORDER — METRONIDAZOLE 0.75 % VA GEL: 1.0000 | Freq: Every day | VAGINAL | Status: DC

## 2011-11-20 MED ORDER — METRONIDAZOLE 500 MG PO TABS: 500.0000 mg | ORAL_TABLET | Freq: Two times a day (BID) | ORAL | Status: AC

## 2011-11-21 ENCOUNTER — Ambulatory Visit: Payer: Medicaid Other | Admitting: Family Medicine

## 2012-02-04 IMAGING — US US OB FOLLOW-UP
1 series · 12 of 28 positions shown · non-contrast
Comparison: none

[Series 1: us ob follow up · 43 acquisitions, 12 frames shown]
[im 2/43]
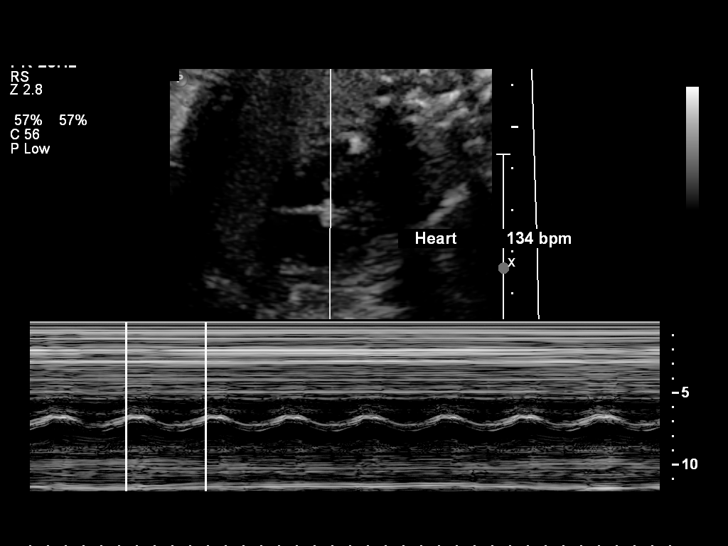
[im 5/43]
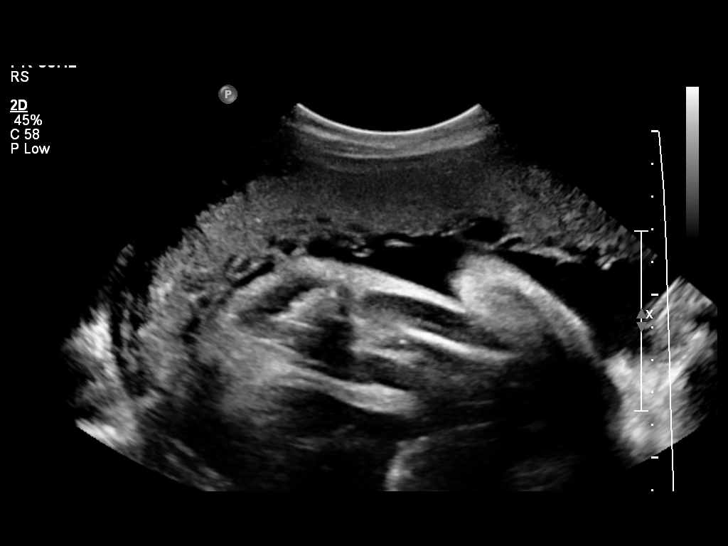
[im 8/43]
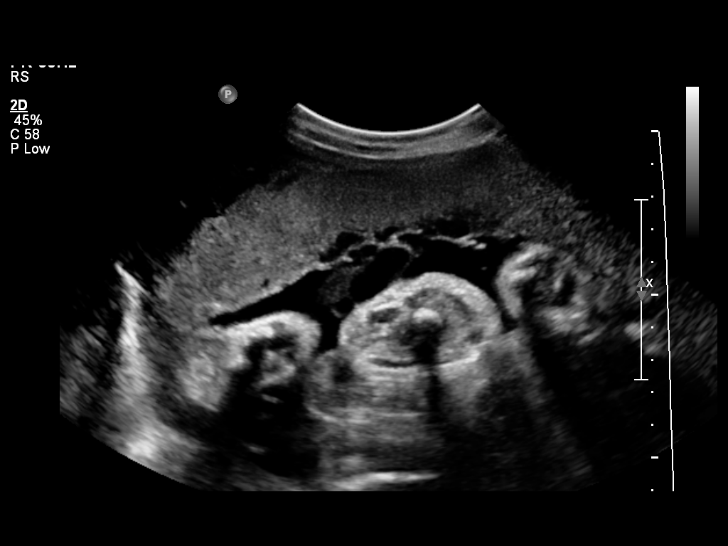
[im 13/43]
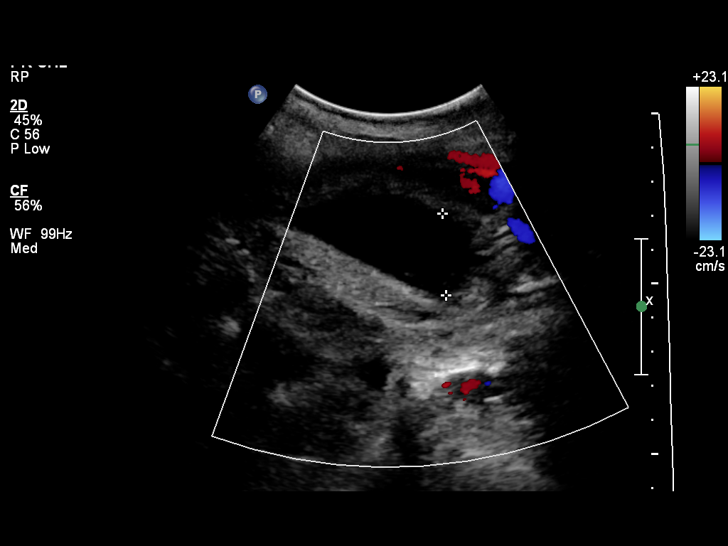
[im 16/43]
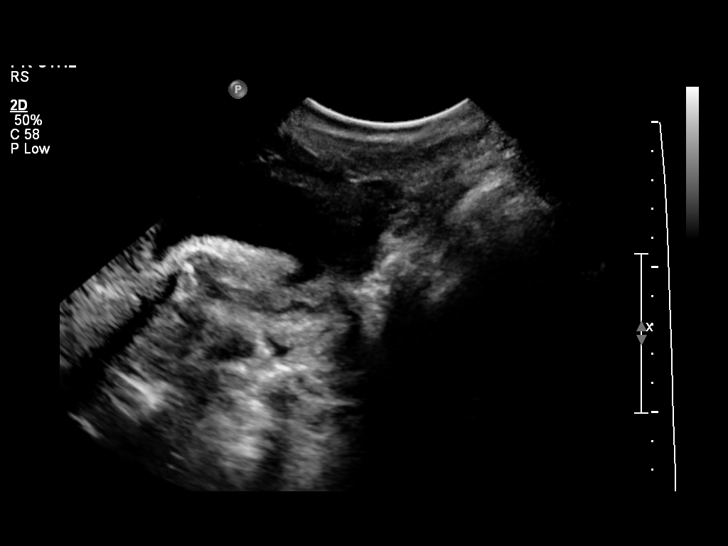
[im 19/43]
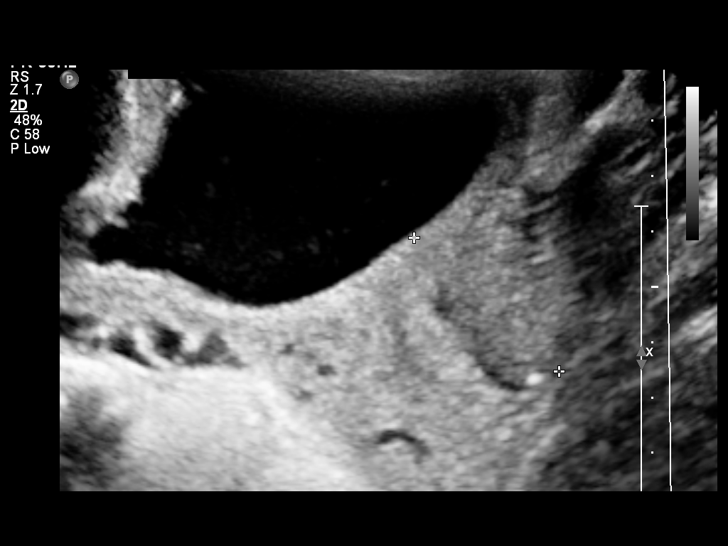
[im 24/43]
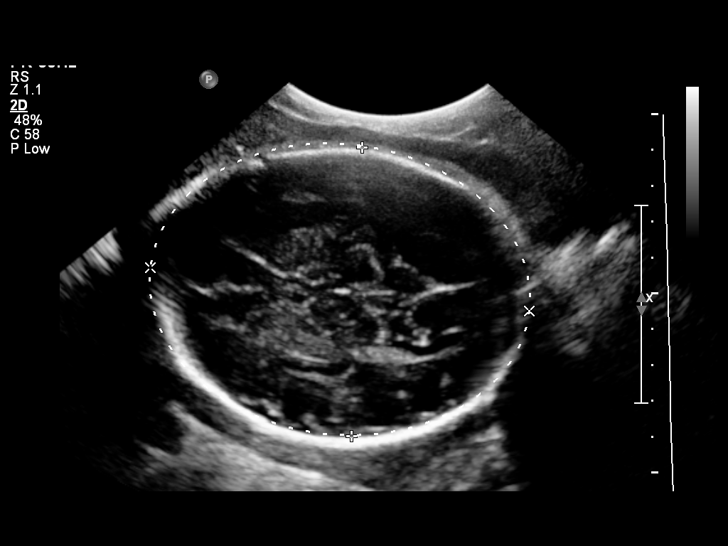
[im 27/43]
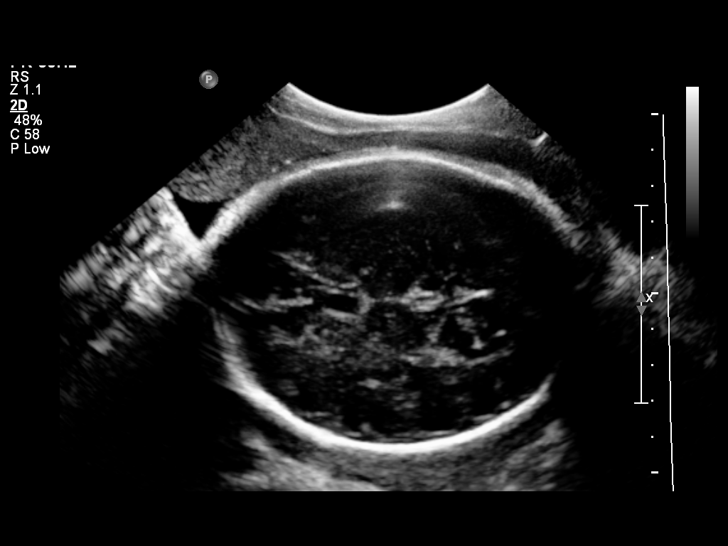
[im 30/43]
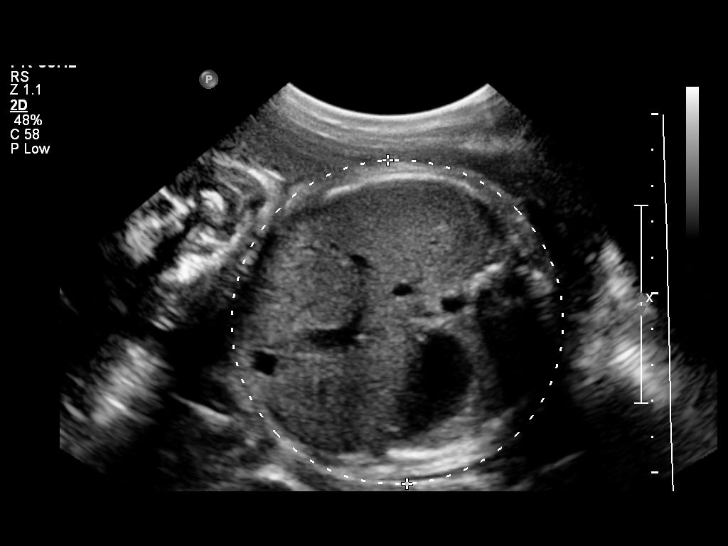
[im 35/43]
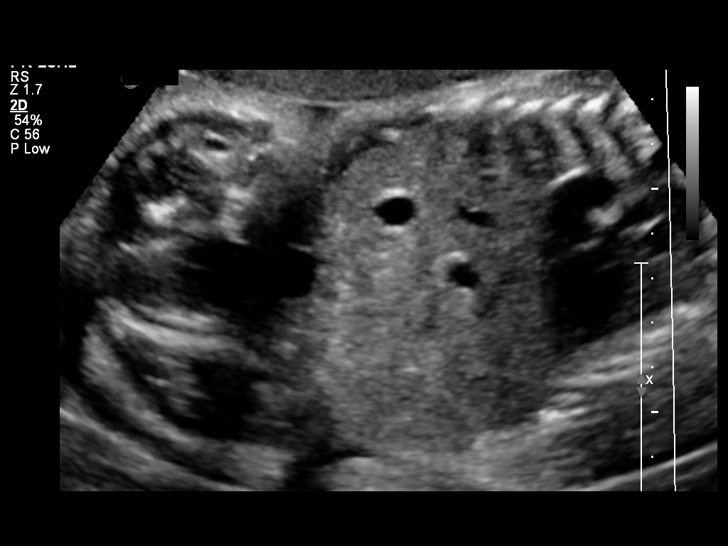
[im 38/43]
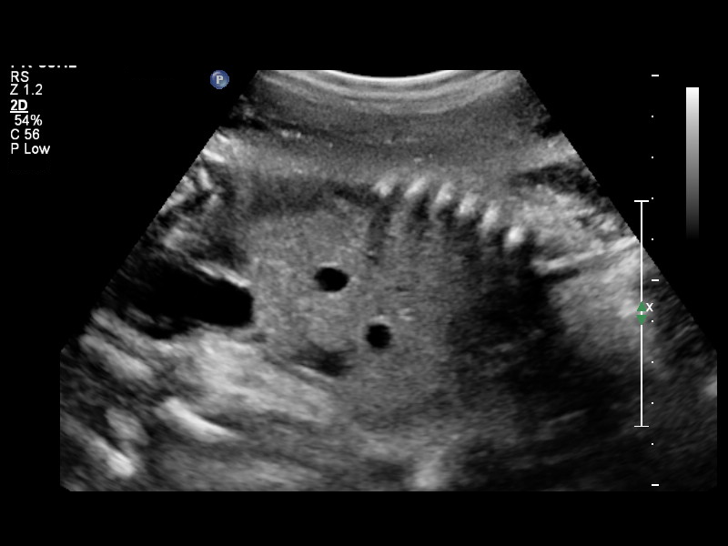
[im 41/43]
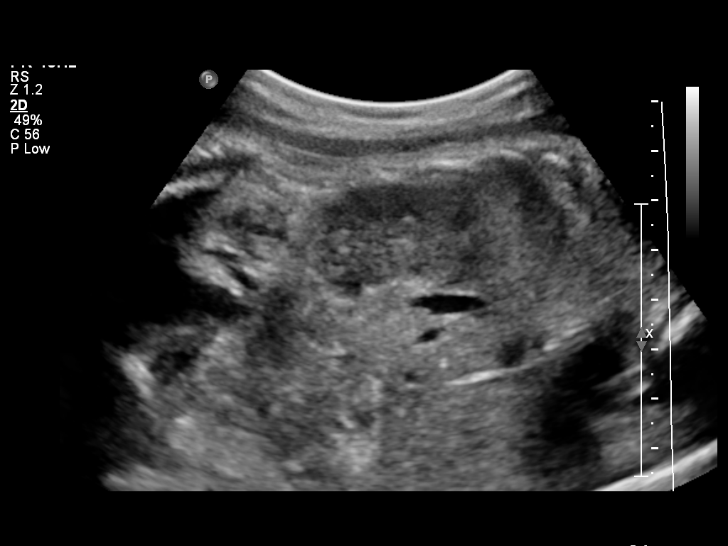

[12 of 28 positions shown; findings below may reference images not displayed]

OBSTETRICS REPORT
                      (Signed Final 08/02/2011 [DATE])

 Order#:         [id number]
Procedures

 US OB FOLLOW UP                                       76816.1
Indications

 Poor obstetric history: Previous preterm delivery
 Cigarette smoker
 Size less than dates (Small for gestational [AGE]
 FGR)
Fetal Evaluation

 Fetal Heart Rate:  134                          bpm
 Cardiac Activity:  Observed
 Presentation:      Cephalic
 Placenta:          Anterior, above cervical os
 P. Cord            Visualized
 Insertion:

 Amniotic Fluid
 AFI FV:      Subjectively within normal limits
 AFI Sum:     11.68   cm       29  %Tile     Larg Pckt:    3.45  cm
 RUQ:   3.45    cm   RLQ:    2.39   cm    LUQ:   3.2     cm   LLQ:    2.64   cm
Biometry

 BPD:     80.7  mm     G. Age:  32w 3d                CI:        73.66   70 - 86
                                                      FL/HC:      21.0   19.1 -

 HC:     298.7  mm     G. Age:  33w 0d       50  %    HC/AC:      1.05   0.96 -

 AC:       284  mm     G. Age:  32w 3d       69  %    FL/BPD:     77.6   71 - 87
 FL:      62.6  mm     G. Age:  32w 3d       56  %    FL/AC:      22.0   20 - 24

 Est. FW:    2717  gm      4 lb 6 oz     71  %
Gestational Age

 LMP:           31w 5d        Date:  12/23/10                 EDD:   09/29/11
 U/S Today:     32w 4d                                        EDD:   09/23/11
 Best:          31w 5d     Det. By:  LMP  (12/23/10)          EDD:   09/29/11
Anatomy
 Cranium:           Appears normal      Aortic Arch:       Previously seen
 Fetal Cavum:       Appears normal      Ductal Arch:       Previously seen
 Ventricles:        Appears normal      Diaphragm:         Appears normal
 Choroid Plexus:    Previously seen     Stomach:           Appears
                                                           normal, left
                                                           sided
 Cerebellum:        Previously seen     Abdomen:           Previously seen
 Posterior Fossa:   Previously seen     Abdominal Wall:    Previously seen
 Nuchal Fold:       Not applicable      Cord Vessels:      Previously seen
                    (>20 wks GA)
 Face:              Previously seen     Kidneys:           Appear normal
 Heart:             Appears normal      Bladder:           Appears normal
                    (4 chamber &
                    axis)
 RVOT:              Previously seen     Spine:             Previously seen
 LVOT:              Previously seen     Limbs:             Previously seen

 Other:     Fetus appears to be a male. Heels and 5th digit
            previously seen. Nasal bone previously visualized.
Cervix Uterus Adnexa

 Cervical Length:    3.54     cm

 Cervix:       Normal appearance by transabdominal scan.

 Adnexa:     No abnormality visualized.
Impression

 Assigned GA is currently 31w 5d.   Appropriate fetal growth,
 with EFW at 71 %ile.
 Amniotic fluid within normal limits, with AFI of 11.68 cm.
 No late developing fetal abnormalities seen involving
 visualized anatomy.

## 2012-05-03 ENCOUNTER — Encounter (HOSPITAL_COMMUNITY): Payer: Self-pay | Admitting: Emergency Medicine

## 2012-05-03 ENCOUNTER — Emergency Department (HOSPITAL_COMMUNITY)
Admission: EM | Admit: 2012-05-03 | Discharge: 2012-05-03 | Disposition: A | Discharge status: Home / Self Care | Payer: Medicaid Other | Attending: Emergency Medicine | Admitting: Emergency Medicine

## 2012-05-03 DIAGNOSIS — R52 Pain, unspecified: Secondary | ICD-10-CM | POA: Insufficient documentation

## 2012-05-03 DIAGNOSIS — J351 Hypertrophy of tonsils: Secondary | ICD-10-CM | POA: Insufficient documentation

## 2012-05-03 DIAGNOSIS — F17200 Nicotine dependence, unspecified, uncomplicated: Secondary | ICD-10-CM | POA: Insufficient documentation

## 2012-05-03 DIAGNOSIS — Z79899 Other long term (current) drug therapy: Secondary | ICD-10-CM | POA: Insufficient documentation

## 2012-05-03 DIAGNOSIS — F3130 Bipolar disorder, current episode depressed, mild or moderate severity, unspecified: Secondary | ICD-10-CM | POA: Insufficient documentation

## 2012-05-03 HISTORY — DX: Other psychoactive substance abuse, uncomplicated: F19.10

## 2012-05-03 NOTE — ED Provider Notes (Addendum)
History     CSN: [id number]  Arrival date & time 05/03/12  4098   First MD Initiated Contact with Patient 05/03/12 [phone]      Chief Complaint  Patient presents with  . Generalized Body Aches    (Consider location/radiation/quality/duration/timing/severity/associated sxs/prior treatment) HPI  25yoF h/o bipolar disorder, substance abuse presents with multiple complaints. Patient states that she woke up at approximately 1 AM and fell today electrical sensation throughout her body from head to b/l feet. She states it lasted for one second. She had another episode as well which lasted for one second. She states to her right ear began to ache after that. She currently rates the pain as a 5/10 at this time. There's been no discharge from her ear. She denies fevers, chills, recent illness. She denies sore throat. There is no neck swelling, muffled voice, trismus. No dental pain. Denies SI/HI/AVH. Denies neck pain/injury,no back pain.   Past Medical History  Diagnosis Date  . Bipolar 1 disorder   . Opiate addiction   . Abnormal Pap smear   . Depression   . Polysubstance abuse     Past Surgical History  Procedure Date  . Vaginal deliveries   . Tubal ligation 09/08/2011    Procedure: POST PARTUM TUBAL LIGATION;  Surgeon: Scheryl Darter, MD;  Location: [facility];  Service: Gynecology;  Laterality: Bilateral;    Family History  Problem Relation Age of Onset  . Drug abuse Mother   . Drug abuse Father   . Diabetes Maternal Grandmother   . Hypertension Maternal Grandmother   . Diabetes Paternal Grandmother   . Diabetes Paternal Grandfather     History  Substance Use Topics  . Smoking status: Current Everyday Smoker -- 1.0 packs/day    Types: Cigarettes  . Smokeless tobacco: Not on file  . Alcohol Use: No    OB History    Grav Para Term Preterm Abortions TAB SAB Ect Mult Living   7 6 3 3 1 1   1 6       Review of Systems  All other systems reviewed and are negative.   except as  noted HPI   Allergies  Review of patient's allergies indicates no known allergies.  Home Medications   Current Outpatient Rx  Name Route Sig Dispense Refill  . SUBOXONE SL Sublingual Place 1 tablet under the tongue daily.    Marland Kitchen DOXYCYCLINE HYCLATE 100 MG PO CAPS Oral Take 100 mg by mouth 2 (two) times daily.    Marland Kitchen REMERON PO Oral Take 1 tablet by mouth daily.      BP 117/81  Pulse 90  Temp(Src) 98.1 F (36.7 C) (Oral)  Resp 16  SpO2 99%  LMP 05/01/2012  Physical Exam  Nursing note and vitals reviewed. Constitutional: She is oriented to person, place, and time. She appears well-developed.  HENT:  Head: Atraumatic.  Mouth/Throat: Oropharynx is clear and moist.       TM wnl b/l No trismus Min enlargement of tonsils without erythema or exudates Uvula midline No pain with pulling at tragus  Eyes: Conjunctivae and EOM are normal. Pupils are equal, round, and reactive to light.  Neck: Normal range of motion. Neck supple.  Cardiovascular: Normal rate, regular rhythm, normal heart sounds and intact distal pulses.   Pulmonary/Chest: Effort normal and breath sounds normal. No respiratory distress. She has no wheezes. She has no rales.  Abdominal: Soft. She exhibits no distension. There is no tenderness. There is no rebound and no guarding.  Musculoskeletal: Normal range of motion.  Lymphadenopathy:    She has no cervical adenopathy.  Neurological: She is alert and oriented to person, place, and time. No cranial nerve deficit. She exhibits normal muscle tone. Coordination normal.       Strength 5/5 all extremities    Skin: Skin is warm and dry. No rash noted.  Psychiatric: She has a normal mood and affect.    ED Course  Procedures (including critical care time)  Labs Reviewed - No data to display No results found.   1. Whole body pain     MDM  Electric shock like sensation lasting 1 sec x 2 just prior to arrival from top of head to feet. Resolved now. Neurologically  intact. Ears unremarkable. I do not feel that her sx warrant CT head or other advanced imaging at this time. I suppose MS would be in the ddx with lhermitte's phenomena but no other si/sx of MS and pt did not notice that pain was with change in movement of her neck. No c spine injury. No h/o tumor. Doubt [facility] precluding discharge at this time. Given Precautions for return. PMD f/u.         Forbes Cellar, MD 05/03/12 0300  Forbes Cellar, MD 05/03/12 0865

## 2012-05-03 NOTE — ED Notes (Signed)
The pt has felt 2 electrical shocks today and just pta she felt an  Electric shock and now her feet are tingling

## 2012-05-03 NOTE — ED Notes (Signed)
PT. REPORTS SHE " FELT AND HEARD  A SHOCK  " IN HER BODY " , STATES SHE DOES NOT KNOW WHAT IS GOING ON , STATES HISTORY OF POLYSUBSTANCE ABUSE AND BIPOLAR , DENIES SUICIDAL IDEATION .

## 2012-05-03 NOTE — Discharge Instructions (Signed)
Pain of Unknown Etiology (Pain Without a Known Cause) You have come to your caregiver because of pain. Pain can occur in any part of the body. Often there is not a definite cause. If your laboratory (blood or urine) work was normal and x-rays or other studies were normal, your caregiver may treat you without knowing the cause of the pain. An example of this is the headache. Most headaches are diagnosed by taking a history. This means your caregiver asks you questions about your headaches. Your caregiver determines a treatment based on your answers. Usually testing done for headaches is normal. Often testing is not done unless there is no response to medications. Regardless of where your pain is located today, you can be given medications to make you comfortable. If no physical cause of pain can be found, most cases of pain will gradually leave as suddenly as they came.  If you have a painful condition and no reason can be found for the pain, It is importantthat you follow up with your caregiver. If the pain becomes worse or does not go away, it may be necessary to repeat tests and look further for a possible cause.  Only take over-the-counter or prescription medicines for pain, discomfort, or fever as directed by your caregiver.   For the protection of your privacy, test results can not be given over the phone. Make sure you receive the results of your test. Ask as to how these results are to be obtained if you have not been informed. It is your responsibility to obtain your test results.   You may continue all activities unless the activities cause more pain. When the pain lessens, it is important to gradually resume normal activities. Resume activities by beginning slowly and gradually increasing the intensity and duration of the activities or exercise. During periods of severe pain, bed-rest may be helpful. Lay or sit in any position that is comfortable.   Ice used for acute (sudden) conditions may be  effective. Use a large plastic bag filled with ice and wrapped in a towel. This may provide pain relief.   See your caregiver for continued problems. They can help or refer you for exercises or physical therapy if necessary.  If you were given medications for your condition, do not drive, operate machinery or power tools, or sign legal documents for 24 hours. Do not drink alcohol, take sleeping pills, or take other medications that may interfere with treatment. See your caregiver immediately if you have pain that is becoming worse and not relieved by medications. Document Released: 08/29/2001 Document Revised: 11/23/2011 Document Reviewed: 12/04/2005 [facility] Patient Information 2012 [place], [place].  RESOURCE GUIDE  Dental Problems  Patients with Medicaid: [facility] [phone] [address]                                           [phone] [address] Phone:  [phone]                                                   Phone:  [phone]  If unable to pay or  uninsured, contact:  [facility] or [facility]. to become qualified for the adult dental clinic.  Chronic Pain Problems Contact [facility]  [phone] Patients need to be referred by their primary care doctor.  Insufficient Money for Medicine Contact [employer]:  call "211" or [facility] 713-200-3943.  No Primary Care Doctor Call [facility]  [phone] Other agencies that provide inexpensive medical care    [facility]  [phone]    [facility]  [phone]    [facility]  [phone]    [facility]  [phone]    [facility]  [phone]    [facility]  [phone]  Psychological Services [facility]  [phone] [facility]  [phone] [facility]   [phone] (emergency services [phone])  Abuse/Neglect [facility] Child Abuse Hotline 931-851-3814 [facility] Child Abuse Hotline [phone] (After Hours)  Emergency Shelter [facility] [phone]  Maternity Homes [facility] at the [place] [facility] [place] [phone] [facility] [phone]  MRSA Hotline #:   (910)855-6776    [facility]  [facility]  [employer]                           [facility] Dept. [address] [place]                     [address]         [address]  Blondell Reveal Phone:  [phone]                                  Phone:  [phone]                   Phone:  [phone]  [facility] Phone:  [phone]  [facility] Child Abuse Hotline [phone] (After Hours)

## 2012-05-09 ENCOUNTER — Telehealth: Payer: Self-pay | Admitting: Family Medicine

## 2012-05-09 NOTE — Telephone Encounter (Signed)
"  Head want stop hurting". Pain is behind ear on L side and moves toward the eye. Also feels shocking feeling in whole body. Denies head trauma. Denies fever. Reports kinda feeling weakness in her L arm.  Asked if patch could cause headache. She is smoking and using nicotine. Advised her that patch would not cause severe headache or weakness. Advised patient to either return to the ED for evaluation or if she is able to tolerate the pain at home call the clinic in the AM for a work in visit. She agreed with the plan and voiced understanding.

## 2012-05-23 ENCOUNTER — Other Ambulatory Visit (HOSPITAL_COMMUNITY)
Admission: RE | Admit: 2012-05-23 | Discharge: 2012-05-23 | Disposition: A | Discharge status: Home / Self Care | Payer: Medicaid Other | Source: Ambulatory Visit | Attending: Family Medicine | Admitting: Family Medicine

## 2012-05-23 ENCOUNTER — Encounter: Payer: Self-pay | Admitting: Family Medicine

## 2012-05-23 ENCOUNTER — Ambulatory Visit (INDEPENDENT_AMBULATORY_CARE_PROVIDER_SITE_OTHER): Payer: Medicaid Other | Admitting: Family Medicine

## 2012-05-23 VITALS — BP 95/58 | HR 93 | Temp 98.5°F | Ht 67.0 in | Wt 136.0 lb

## 2012-05-23 DIAGNOSIS — Z2089 Contact with and (suspected) exposure to other communicable diseases: Secondary | ICD-10-CM

## 2012-05-23 DIAGNOSIS — B49 Unspecified mycosis: Secondary | ICD-10-CM

## 2012-05-23 DIAGNOSIS — B379 Candidiasis, unspecified: Secondary | ICD-10-CM | POA: Insufficient documentation

## 2012-05-23 DIAGNOSIS — Z202 Contact with and (suspected) exposure to infections with a predominantly sexual mode of transmission: Secondary | ICD-10-CM

## 2012-05-23 DIAGNOSIS — Z113 Encounter for screening for infections with a predominantly sexual mode of transmission: Secondary | ICD-10-CM | POA: Insufficient documentation

## 2012-05-23 DIAGNOSIS — N760 Acute vaginitis: Secondary | ICD-10-CM

## 2012-05-23 LAB — RPR

## 2012-05-23 LAB — POCT WET PREP (WET MOUNT): Clue Cells Wet Prep Whiff POC: NEGATIVE

## 2012-05-23 MED ORDER — FLUCONAZOLE 150 MG PO TABS: 150.0000 mg | ORAL_TABLET | Freq: Once | ORAL | Status: AC

## 2012-05-23 NOTE — Patient Instructions (Signed)
Meds ordered this encounter  Medications  . fluconazole (DIFLUCAN) 150 MG tablet    Sig: Take 1 tablet (150 mg total) by mouth once.    Dispense:  2 tablet    Refill:  0  I will call you next week with the results of your blood test and genital swabs.   Make sure to use protection at all times.

## 2012-05-23 NOTE — Assessment & Plan Note (Signed)
Classic yeast infection.

## 2012-05-23 NOTE — Assessment & Plan Note (Signed)
Husband has a drug addiction problem and has been unfaithful. She does not know if she has any STD. GC/Cl/RPR/HIV

## 2012-05-23 NOTE — Progress Notes (Signed)
  Subjective:   Patient ID: Rhonda Cabrera, female DOB: 1986-09-30 26 y.o. MRN: [id number] HPI:  1. Yeast Infection Course: worsening Synopsis: white curd-like discharge, painful and itchy.  Location: vagina Onset: has been acute  Time period of: 3 day(s).  Severity is described as moderate.  Aggravating: unknown Alleviating: none Associated sx/sn: no fever, no lymphadenopathy, no arthritis  2. Exposure to individual with possible STD Synopsis: patient has a husband with a drug addiction who is not faithful. She has had unprotected sex with him and then developed discharge.  Location: vagina Onset: has been acute  No history of STD.  History  Substance Use Topics  . Smoking status: Current Everyday Smoker -- 1.0 packs/day    Types: Cigarettes  . Smokeless tobacco: Not on file  . Alcohol Use: No    Review of Systems: Pertinent items are noted in HPI.  Labs Reviewed: yes Reviewed Chart Review for last notes.     Objective:   Filed Vitals:   05/23/12 1059  BP: 95/58  Pulse: 93  Temp: 98.5 F (36.9 C)  TempSrc: Oral  Height: 5\' 7"  (1.702 m)  Weight: 136 lb (61.689 kg)   Physical Exam: General: aaf, skinny, nad, pleasant Abdomen: soft and non-tender without masses, organomegaly or hernias noted.  No guarding or rebound Genital: curd-like white yeast smelling discharge, no ulcerations. No CMT. Labia normal without lesions.   Assessment & Plan:

## 2012-05-24 LAB — HIV ANTIBODY (ROUTINE TESTING W REFLEX): HIV: NONREACTIVE

## 2012-05-28 ENCOUNTER — Telehealth: Payer: Self-pay | Admitting: Family Medicine

## 2012-05-28 NOTE — Telephone Encounter (Signed)
Attempted to call patient on both numbers. Not working.  Patient has yeast, other test results normal.

## 2012-05-28 NOTE — Telephone Encounter (Signed)
Pt is asking for her test results

## 2012-07-09 ENCOUNTER — Ambulatory Visit (INDEPENDENT_AMBULATORY_CARE_PROVIDER_SITE_OTHER): Payer: Medicaid Other | Admitting: Family Medicine

## 2012-07-09 ENCOUNTER — Encounter: Payer: Self-pay | Admitting: Family Medicine

## 2012-07-09 VITALS — BP 97/65 | HR 78 | Temp 99.2°F | Wt 138.5 lb

## 2012-07-09 DIAGNOSIS — N898 Other specified noninflammatory disorders of vagina: Secondary | ICD-10-CM

## 2012-07-09 DIAGNOSIS — R21 Rash and other nonspecific skin eruption: Secondary | ICD-10-CM

## 2012-07-09 LAB — POCT WET PREP (WET MOUNT)

## 2012-07-09 MED ORDER — TRIAMCINOLONE ACETONIDE 0.1 % EX CREA
TOPICAL_CREAM | Freq: Two times a day (BID) | CUTANEOUS | Status: DC
Start: 2012-07-09 — End: 2012-11-03

## 2012-07-09 NOTE — Assessment & Plan Note (Addendum)
Will check wet prep and gc.chlamydia.  Has had long history of unprotected sex with multiple partners for which she is seeing a therapist for.  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  Advised follow-up in 4-6 weeks for repeat screening.  NO known exposures.

## 2012-07-09 NOTE — Patient Instructions (Addendum)
Use cream for rash  Get some benadryl or zyrtec to help with itching  Wear a shirt with sleeves to cover your skin  I will call you at [phone] if you need to be treated.  Otherwise will send letter with normal results.  Let me know if you do not hear anything in 2 weeks

## 2012-07-09 NOTE — Progress Notes (Signed)
  Subjective:    Patient ID: Rhonda Cabrera, female    DOB: January 21, 1986, 26 y.o.   MRN: [id number]  HPI Work in appt  Rash on back and upper arms.  Got new hair extensions several days ago.[employer].  Otherwise no new meds.  No fever, no household ocntacts with similar.  Vaginal discharge:  Notes vaginal discharge.  Reports unprotected sex.  No new lesions.  No abdominal pain, dysuria  Review of Systemssee HPI     Objective:   Physical Exam GEN: Alert & Oriented, No acute distress Skin; erythematous wheals on midline back mainly with some upper arm involvement.  No papules or pustules. Pelvic Exam:        External: normal female genitalia without lesions or masses        Vagina: normal without lesions or masses        Cervix: normal without lesions or masses        Adnexa: normal bimanual exam without masses or fullness        Uterus: normal by palpation        Samples for Wet prep, GC/Chlamydia obtained         Assessment & Plan:

## 2012-07-09 NOTE — Assessment & Plan Note (Signed)
Most likley due to hair extensions.  Advised to cover skin so it does not come into contact with hair extensions.  Will use benadryl or zyrtec for itching.  Prescribed topical steroid to help as well with contact dermatitis.

## 2012-07-10 ENCOUNTER — Telehealth: Payer: Self-pay | Admitting: Family Medicine

## 2012-07-10 ENCOUNTER — Other Ambulatory Visit (HOSPITAL_COMMUNITY)
Admission: RE | Admit: 2012-07-10 | Discharge: 2012-07-10 | Disposition: A | Discharge status: Home / Self Care | Payer: Medicaid Other | Source: Ambulatory Visit | Attending: Family Medicine | Admitting: Family Medicine

## 2012-07-10 DIAGNOSIS — A749 Chlamydial infection, unspecified: Secondary | ICD-10-CM

## 2012-07-10 DIAGNOSIS — Z113 Encounter for screening for infections with a predominantly sexual mode of transmission: Secondary | ICD-10-CM | POA: Insufficient documentation

## 2012-07-10 MED ORDER — FLUCONAZOLE 150 MG PO TABS: 150.0000 mg | ORAL_TABLET | Freq: Once | ORAL | Status: AC

## 2012-07-10 NOTE — Telephone Encounter (Signed)
Patient is calling with questions about the rash and wet prep from her appt yesterday.

## 2012-07-10 NOTE — Telephone Encounter (Signed)
Called and informed pt that rx has been sent.Rhonda Cabrera  

## 2012-07-10 NOTE — Telephone Encounter (Signed)
rx for diflucan sent

## 2012-07-10 NOTE — Telephone Encounter (Signed)
Called pt and informed her that she does not have BV, or trichomonas, there was evidence of yeast.Arijana Narayan, [place]

## 2012-07-10 NOTE — Addendum Note (Signed)
Addended by: Deno Etienne on: 07/10/2012 03:29 PM   Modules accepted: Orders

## 2012-07-10 NOTE — Telephone Encounter (Signed)
Pt is asking for an Rx for Diflucan to be sent to [facility] on [address]

## 2012-07-11 ENCOUNTER — Telehealth: Payer: Self-pay | Admitting: Family Medicine

## 2012-07-11 NOTE — Telephone Encounter (Signed)
Patient is calling back about the results.

## 2012-07-11 NOTE — Telephone Encounter (Signed)
Please call when labs have come back for results

## 2012-07-12 ENCOUNTER — Ambulatory Visit (INDEPENDENT_AMBULATORY_CARE_PROVIDER_SITE_OTHER): Payer: Medicaid Other | Admitting: *Deleted

## 2012-07-12 ENCOUNTER — Other Ambulatory Visit: Payer: Self-pay | Admitting: Family Medicine

## 2012-07-12 DIAGNOSIS — A749 Chlamydial infection, unspecified: Secondary | ICD-10-CM

## 2012-07-12 HISTORY — DX: Chlamydial infection, unspecified: A74.9

## 2012-07-12 MED ORDER — AZITHROMYCIN 1 G PO PACK
1.0000 g | PACK | Freq: Once | ORAL | Status: AC
  Administered 2012-07-12: 1 g via ORAL

## 2012-07-12 NOTE — Assessment & Plan Note (Signed)
Will call to come in for treatment with azithromycin slurry

## 2012-07-12 NOTE — Telephone Encounter (Signed)
LVM for patient to call back about her results.

## 2012-07-12 NOTE — Telephone Encounter (Signed)
Patient came in for treatment today

## 2012-07-12 NOTE — Telephone Encounter (Signed)
Patient is calling again about her results.  She said that Archie Patten was supposed to call her back today with them.

## 2012-07-12 NOTE — Telephone Encounter (Signed)
Please call patient and advised to come in for treatment for positive chlamydia test

## 2012-07-12 NOTE — Progress Notes (Signed)
Patient in for STD treatment. Advised to tell partner to be  treated. Hand out on Chlamydia given. Advised to abstain from sex for 7 days.

## 2012-07-12 NOTE — Telephone Encounter (Signed)
Patient came in today for treatment.

## 2012-07-15 ENCOUNTER — Other Ambulatory Visit: Payer: Medicaid Other

## 2012-07-15 ENCOUNTER — Telehealth: Payer: Self-pay | Admitting: Family Medicine

## 2012-07-15 NOTE — Telephone Encounter (Signed)
Pt called emergency line to know the name of medication she was recently on for Chlamydia. I accessed her chart and informed pt that she was treated with Azithromycin. Pt wanted to know length of sexual abstinence and alcohol use after treatment. Information given.

## 2012-07-19 ENCOUNTER — Telehealth: Payer: Self-pay | Admitting: Family Medicine

## 2012-07-19 NOTE — Telephone Encounter (Signed)
Patient called about rash on her back. Asked if she had a spider bite, discussed what that would look like. But she does not sound like this is the case. She was recently seen for rash and started on Triamcinolone. Denies fevers, but states rash has spread some to include behind her ears. No difficulty breathing or swelling of lips. Encouraged her to use benadryl, as well as creams that she was previously using for symptomatic relief.  She should call the clinic at 8:30am to make a SDA. If she is overly concerned and thinks it is getting much worse, she can be seen in the ED. Patient is agreeable.  Jenesis Martin M. Fausto Sampedro, M.D. 07/19/2012 12:15 AM

## 2012-08-27 ENCOUNTER — Emergency Department (INDEPENDENT_AMBULATORY_CARE_PROVIDER_SITE_OTHER)
Admission: EM | Admit: 2012-08-27 | Discharge: 2012-08-27 | Disposition: A | Discharge status: Home / Self Care | Payer: Self-pay | Source: Home / Self Care

## 2012-08-27 ENCOUNTER — Emergency Department (INDEPENDENT_AMBULATORY_CARE_PROVIDER_SITE_OTHER): Payer: Medicaid Other

## 2012-08-27 ENCOUNTER — Encounter (HOSPITAL_COMMUNITY): Payer: Self-pay | Admitting: Emergency Medicine

## 2012-08-27 DIAGNOSIS — S91109A Unspecified open wound of unspecified toe(s) without damage to nail, initial encounter: Secondary | ICD-10-CM

## 2012-08-27 DIAGNOSIS — S91112A Laceration without foreign body of left great toe without damage to nail, initial encounter: Secondary | ICD-10-CM

## 2012-08-27 MED ORDER — ACETAMINOPHEN-CODEINE #3 300-30 MG PO TABS: 1.0000 | ORAL_TABLET | Freq: Four times a day (QID) | ORAL | Status: AC | PRN

## 2012-08-27 MED ORDER — CEPHALEXIN 500 MG PO CAPS: 500.0000 mg | ORAL_CAPSULE | Freq: Three times a day (TID) | ORAL | Status: AC

## 2012-08-27 NOTE — ED Notes (Signed)
Pt was running and stubbed her toe on a broken tile about 3 hours ago. Pt states the pain has not improved despite taking ibuprofen.

## 2012-08-27 NOTE — ED Provider Notes (Signed)
History     CSN: [id number]  Arrival date & time 08/27/12  1945   None     Chief Complaint  Patient presents with  . Extremity Laceration    (Consider location/radiation/quality/duration/timing/severity/associated sxs/prior treatment) HPI Comments: Wile running stepped on a broken floor tile creating a laceration to the L great toe. Mild bleeding on arrival , controlled with dressing.    Past Medical History  Diagnosis Date  . Bipolar 1 disorder   . Opiate addiction   . Abnormal Pap smear   . Depression   . Polysubstance abuse     Past Surgical History  Procedure Date  . Vaginal deliveries   . Tubal ligation 09/08/2011    Procedure: POST PARTUM TUBAL LIGATION;  Surgeon: Scheryl Darter, MD;  Location: [facility];  Service: Gynecology;  Laterality: Bilateral;    Family History  Problem Relation Age of Onset  . Drug abuse Mother   . Drug abuse Father   . Diabetes Maternal Grandmother   . Hypertension Maternal Grandmother   . Diabetes Paternal Grandmother   . Diabetes Paternal Grandfather     History  Substance Use Topics  . Smoking status: Current Everyday Smoker -- 1.0 packs/day    Types: Cigarettes  . Smokeless tobacco: Not on file  . Alcohol Use: No    OB History    Grav Para Term Preterm Abortions TAB SAB Ect Mult Living   7 6 3 3 1 1   1 6       Review of Systems  All other systems reviewed and are negative.    Allergies  Review of patient's allergies indicates no known allergies.  Home Medications   Current Outpatient Rx  Name Route Sig Dispense Refill  . ACETAMINOPHEN-CODEINE #3 300-30 MG PO TABS Oral Take 1-2 tablets by mouth every 6 (six) hours as needed for pain. 15 tablet 0  . AZITHROMYCIN 1 G PO PACK Oral Take 1 packet by mouth once. Please give slurry po x 1 when arrives for chlamydia treatment 1 each 0  . SUBOXONE SL Sublingual Place 1 tablet under the tongue daily.    . CEPHALEXIN 500 MG PO CAPS Oral Take 1 capsule (500 mg total) by mouth 3  (three) times daily. 21 capsule 0  . DOXYCYCLINE HYCLATE 100 MG PO CAPS Oral Take 100 mg by mouth 2 (two) times daily.    Marland Kitchen REMERON PO Oral Take 1 tablet by mouth daily.    . TRIAMCINOLONE ACETONIDE 0.1 % EX CREA Topical Apply topically 2 (two) times daily. 30 g 0    BP 111/60  Pulse 71  Temp 98 F (36.7 C) (Oral)  Resp 18  SpO2 100%  LMP 07/23/2012  Breastfeeding? No  Physical Exam  Constitutional: She is oriented to person, place, and time. She appears well-developed and well-nourished.  Pulmonary/Chest: Effort normal.  Neurological: She is alert and oriented to person, place, and time.       Superficial 3.5cm laceration to the medial aspect of the L great toe. Distal motor/sensation intact, nl color.   Skin: Skin is warm and dry.    ED Course  LACERATION REPAIR Date/Time: 08/27/2012 10:51 PM Performed by: Phineas Real, Ranada Vigorito Authorized by: Phineas Real, Damari Suastegui Consent: Verbal consent obtained. Patient identity confirmed: verbally with patient Foreign bodies: no foreign bodies Tendon involvement: none Nerve involvement: none Vascular damage: no Anesthesia: local infiltration Local anesthetic: lidocaine 2% without epinephrine Anesthetic total: 3 ml Irrigation solution: saline Irrigation method: syringe Amount of cleaning: standard Debridement: none  Degree of undermining: none Skin closure: 4-0 Prolene Approximation: close Approximation difficulty: simple Dressing: 4x4 sterile gauze and gauze roll Patient tolerance: Patient tolerated the procedure well with no immediate complications.   (including critical care time)  Labs Reviewed - No data to display Dg Foot Complete Left  08/27/2012  *RADIOLOGY REPORT*  Clinical Data: Injury to the great toe with pain and laceration.  LEFT FOOT - COMPLETE 3+ VIEW  Comparison: None.  Findings: Soft tissue swelling and laceration over the left first toe.  Gauze material is present which obscures some detail but no obvious radiopaque foreign bodies  are demonstrated.  Underlying bones appear intact.  No evidence of acute fracture or subluxation. No focal bone lesion or bone destruction.  Bone cortex and trabecular architecture appear intact.  IMPRESSION: Deep laceration to the plantar aspect of the left first toe.  No radiopaque foreign bodies or acute bony abnormalities demonstrated.   Original Report Authenticated By: Marlon Pel, M.D.      1. Laceration of great toe of left foot       MDM  Dg Foot Complete Left  08/27/2012  *RADIOLOGY REPORT*  Clinical Data: Injury to the great toe with pain and laceration.  LEFT FOOT - COMPLETE 3+ VIEW  Comparison: None.  Findings: Soft tissue swelling and laceration over the left first toe.  Gauze material is present which obscures some detail but no obvious radiopaque foreign bodies are demonstrated.  Underlying bones appear intact.  No evidence of acute fracture or subluxation. No focal bone lesion or bone destruction.  Bone cortex and trabecular architecture appear intact.  IMPRESSION: Deep laceration to the plantar aspect of the left first toe.  No radiopaque foreign bodies or acute bony abnormalities demonstrated.   Original Report Authenticated By: Marlon Pel, M.D.   Dg Foot Complete Left  08/27/2012  *RADIOLOGY REPORT*  Clinical Data: Injury to the great toe with pain and laceration.  LEFT FOOT - COMPLETE 3+ VIEW  Comparison: None.  Findings: Soft tissue swelling and laceration over the left first toe.  Gauze material is present which obscures some detail but no obvious radiopaque foreign bodies are demonstrated.  Underlying bones appear intact.  No evidence of acute fracture or subluxation. No focal bone lesion or bone destruction.  Bone cortex and trabecular architecture appear intact.  IMPRESSION: Deep laceration to the plantar aspect of the left first toe.  No radiopaque foreign bodies or acute bony abnormalities demonstrated.   Original Report Authenticated By: Marlon Pel,  M.D.      \ Suture removal 8 d.  Tylenol #3 1 q 4h prn Keflex 500 tid. Keep clean and dry.  No longer on Suboxone      Hayden Rasmussen, NP 08/27/12 2255

## 2012-08-27 NOTE — ED Provider Notes (Signed)
Medical screening examination/treatment/procedure(s) were performed by non-physician practitioner and as supervising physician I was immediately available for consultation/collaboration.  Sacha Topor, M.D.   Raider Valbuena C Delylah Stanczyk, MD 08/27/12 2256 

## 2012-09-12 ENCOUNTER — Encounter: Payer: Self-pay | Admitting: *Deleted

## 2012-11-03 ENCOUNTER — Observation Stay (HOSPITAL_COMMUNITY)
Admission: EM | Admit: 2012-11-03 | Discharge: 2012-11-04 | Disposition: A | Discharge status: Home / Self Care | Payer: Medicaid Other | Attending: General Surgery | Admitting: General Surgery

## 2012-11-03 ENCOUNTER — Encounter (HOSPITAL_COMMUNITY): Payer: Self-pay | Admitting: Emergency Medicine

## 2012-11-03 ENCOUNTER — Telehealth: Payer: Self-pay | Admitting: Family Medicine

## 2012-11-03 ENCOUNTER — Emergency Department (HOSPITAL_COMMUNITY): Payer: Medicaid Other

## 2012-11-03 DIAGNOSIS — K8000 Calculus of gallbladder with acute cholecystitis without obstruction: Principal | ICD-10-CM | POA: Insufficient documentation

## 2012-11-03 DIAGNOSIS — K819 Cholecystitis, unspecified: Secondary | ICD-10-CM

## 2012-11-03 DIAGNOSIS — I10 Essential (primary) hypertension: Secondary | ICD-10-CM | POA: Insufficient documentation

## 2012-11-03 HISTORY — DX: Essential (primary) hypertension: I10

## 2012-11-03 HISTORY — DX: Anxiety disorder, unspecified: F41.9

## 2012-11-03 LAB — WET PREP, GENITAL: Clue Cells Wet Prep HPF POC: NONE SEEN

## 2012-11-03 LAB — COMPREHENSIVE METABOLIC PANEL
AST: 14 U/L (ref 0–37)
Albumin: 3.7 g/dL (ref 3.5–5.2)
Alkaline Phosphatase: 65 U/L (ref 39–117)
BUN: 17 mg/dL (ref 6–23)
CO2: 24 mEq/L (ref 19–32)
Chloride: 103 mEq/L (ref 96–112)
Creatinine, Ser: 1.06 mg/dL (ref 0.50–1.10)
GFR calc non Af Amer: 72 mL/min — ABNORMAL LOW (ref >90)
Potassium: 3.5 mEq/L (ref 3.5–5.1)
Total Bilirubin: 0.3 mg/dL (ref 0.3–1.2)

## 2012-11-03 LAB — URINALYSIS, ROUTINE W REFLEX MICROSCOPIC
Glucose, UA: NEGATIVE mg/dL
Hgb urine dipstick: NEGATIVE
Ketones, ur: NEGATIVE mg/dL
Protein, ur: NEGATIVE mg/dL
Urobilinogen, UA: 1 mg/dL (ref 0.0–1.0)

## 2012-11-03 LAB — CBC WITH DIFFERENTIAL/PLATELET
Eosinophils Relative: 5 % (ref 0–5)
HCT: 38.9 % (ref 36.0–46.0)
Lymphocytes Relative: 36 % (ref 12–46)
Lymphs Abs: 2.9 10*3/uL (ref 0.7–4.0)
MCV: 87.4 fL (ref 78.0–100.0)
Monocytes Absolute: 0.5 10*3/uL (ref 0.1–1.0)
Neutro Abs: 4.3 10*3/uL (ref 1.7–7.7)
RBC: 4.45 MIL/uL (ref 3.87–5.11)
WBC: 8 10*3/uL (ref 4.0–10.5)

## 2012-11-03 LAB — PREGNANCY, URINE: Preg Test, Ur: NEGATIVE

## 2012-11-03 LAB — LIPASE, BLOOD: Lipase: 21 U/L (ref 11–59)

## 2012-11-03 MED ORDER — ONDANSETRON HCL 4 MG/2ML IJ SOLN
4.0000 mg | Freq: Once | INTRAMUSCULAR | Status: AC
  Administered 2012-11-03: 4 mg via INTRAVENOUS
  Filled 2012-11-03: qty 2

## 2012-11-03 MED ORDER — GI COCKTAIL ~~LOC~~
30.0000 mL | Freq: Once | ORAL | Status: AC
  Administered 2012-11-04: 30 mL via ORAL
  Filled 2012-11-03: qty 30

## 2012-11-03 MED ORDER — SODIUM CHLORIDE 0.9 % IV BOLUS (SEPSIS)
500.0000 mL | Freq: Once | INTRAVENOUS | Status: AC
  Administered 2012-11-03: 500 mL via INTRAVENOUS

## 2012-11-03 NOTE — ED Notes (Addendum)
Pt reports 9/10 abdominal pain that began approximately one month ago. Pt reports nausea, however denies vomiting or diarrhea. Normoactive bowel sounds heard upon ascultation, pt reports tenderness upon palpation. Pt reports taking Gas-x and laxative, which has not relived the pain. Last BM was today. Pt states she is also concerned because she was diagnosed several months ago for a STI, she is concerned that she may have returned to sexual activity to soon after treatment. Pt denies bleeding, however states she has had vaginal discharge.

## 2012-11-03 NOTE — ED Notes (Signed)
No relief with gas-X , pepto-bismol or OTC medications.

## 2012-11-03 NOTE — Telephone Encounter (Signed)
Called emergency line regarding upper abdominal pain she notices after she eats meals. Present for past month on and off. Tried pepto, tums sometimes helps. She denies vomiting, dark stools. She notices infrequent blood on rectum but has a history of hemorrhoids. I advise her that i cannot fully assess her pain over the phone, but she may try OTC prilosec twice daily and call clinic for work in appt in the am. If symptoms are very severe or she cannot bear it, can present to ED.

## 2012-11-03 NOTE — ED Provider Notes (Signed)
History     CSN: [id number]  Arrival date & time 11/03/12  2133   First MD Initiated Contact with Patient 11/03/12 2211      Chief Complaint  Patient presents with  . Abdominal Pain  . Vaginal Discharge    (Consider location/radiation/quality/duration/timing/severity/associated sxs/prior treatment) Patient is a 26 y.o. female presenting with abdominal pain and vaginal discharge. The history is provided by the patient.  Abdominal Pain The primary symptoms of the illness include abdominal pain and nausea. The primary symptoms of the illness do not include shortness of breath, vomiting, diarrhea or vaginal discharge.  Symptoms associated with the illness do not include back pain.  Vaginal Discharge Associated symptoms include abdominal pain. Pertinent negatives include no chest pain, no headaches and no shortness of breath.   patient's had episodes of upper abdominal pain for last month. He comes and goes. She's had nauseousness or vomiting. No relief with Gas-X or laxative. It has been worse with eating at times. No fevers. She also is concerned that she was diagnosed a few months ago at [facility]. She thinks it matched returned section activity too soon after. She denies vaginal discharge to me. He is alert and  Past Medical History  Diagnosis Date  . Bipolar 1 disorder   . Opiate addiction   . Abnormal Pap smear   . Depression   . Polysubstance abuse   . Hypertension     Past Surgical History  Procedure Date  . Vaginal deliveries   . Tubal ligation 09/08/2011    Procedure: POST PARTUM TUBAL LIGATION;  Surgeon: Scheryl Darter, MD;  Location: [facility];  Service: Gynecology;  Laterality: Bilateral;    Family History  Problem Relation Age of Onset  . Drug abuse Mother   . Drug abuse Father   . Diabetes Maternal Grandmother   . Hypertension Maternal Grandmother   . Diabetes Paternal Grandmother   . Diabetes Paternal Grandfather     History  Substance Use Topics  . Smoking  status: Current Every Day Smoker -- 1.0 packs/day    Types: Cigarettes  . Smokeless tobacco: Never Used  . Alcohol Use: No    OB History    Grav Para Term Preterm Abortions TAB SAB Ect Mult Living   7 6 3 3 1 1   1 6       Review of Systems  Constitutional: Negative for activity change and appetite change.  HENT: Negative for neck stiffness.   Eyes: Negative for pain.  Respiratory: Negative for chest tightness and shortness of breath.   Cardiovascular: Negative for chest pain and leg swelling.  Gastrointestinal: Positive for nausea and abdominal pain. Negative for vomiting and diarrhea.  Genitourinary: Negative for flank pain and vaginal discharge.  Musculoskeletal: Negative for back pain.  Skin: Negative for rash.  Neurological: Negative for weakness, numbness and headaches.  Psychiatric/Behavioral: Negative for behavioral problems.    Allergies  Review of patient's allergies indicates no known allergies.  Home Medications   Current Outpatient Rx  Name  Route  Sig  Dispense  Refill  . BISACODYL 5 MG PO TBEC   Oral   Take 5 mg by mouth daily as needed. For constipation         . BISMUTH SUBSALICYLATE 262 MG/15ML PO SUSP   Oral   Take 15 mLs by mouth every 6 (six) hours as needed. For upset stomach         . CALCIUM CARBONATE ANTACID 500 MG PO CHEW   Oral  Chew 1 tablet by mouth 3 (three) times daily as needed. For upset stomach         . ADULT MULTIVITAMIN W/MINERALS CH   Oral   Take 1 tablet by mouth daily.         Marland Kitchen NAPROXEN SODIUM 220 MG PO TABS   Oral   Take 220 mg by mouth 2 (two) times daily as needed. For pain         . SIMETHICONE 125 MG PO CHEW   Oral   Chew 125 mg by mouth every 6 (six) hours as needed. For gas           BP 104/60  Pulse 54  Temp 98.5 F (36.9 C) (Oral)  Resp 16  Ht 5\' 8"  (1.727 m)  Wt 150 lb (68.04 kg)  BMI 22.81 kg/m2  SpO2 100%  LMP 10/21/2012  Breastfeeding? No  Physical Exam  Nursing note and vitals  reviewed. Constitutional: She is oriented to person, place, and time. She appears well-developed and well-nourished.  HENT:  Head: Normocephalic and atraumatic.  Eyes: EOM are normal. Pupils are equal, round, and reactive to light.  Neck: Normal range of motion. Neck supple.  Cardiovascular: Normal rate, regular rhythm and normal heart sounds.   No murmur heard. Pulmonary/Chest: Effort normal and breath sounds normal. No respiratory distress. She has no wheezes. She has no rales.  Abdominal: Soft. Bowel sounds are normal. She exhibits no distension. There is tenderness. There is no rebound and no guarding.       Epigastric tenderness without rebound or guarding. No masses.  Genitourinary: No vaginal discharge found.       No cervical motion tenderness.  Musculoskeletal: Normal range of motion.  Neurological: She is alert and oriented to person, place, and time. No cranial nerve deficit.  Skin: Skin is warm and dry.  Psychiatric: She has a normal mood and affect. Her speech is normal.    ED Course  Procedures (including critical care time)  Labs Reviewed  URINALYSIS, ROUTINE W REFLEX MICROSCOPIC - Abnormal; Notable for the following:    APPearance CLOUDY (*)     All other components within normal limits  WET PREP, GENITAL - Abnormal; Notable for the following:    WBC, Wet Prep HPF POC FEW (*)     All other components within normal limits  CBC WITH DIFFERENTIAL  PREGNANCY, URINE  LIPASE, BLOOD  COMPREHENSIVE METABOLIC PANEL  GC/CHLAMYDIA PROBE AMP   No results found.   No diagnosis found.    MDM  Patient presents with a month history of upper abdominal pain. Worse after eating. Some mild tenderness. Patient also is concerned about STD. Benign pelvic exam. Lab work and ultrasound are pending at this time. Care will be turned over to Dr. Silverio Lay.        Juliet Rude. Rubin Payor, MD 11/03/12 [phone]

## 2012-11-04 ENCOUNTER — Emergency Department (HOSPITAL_COMMUNITY)
Admission: EM | Admit: 2012-11-04 | Discharge: 2012-11-05 | Disposition: A | Discharge status: Home / Self Care | Payer: Medicaid Other | Attending: Emergency Medicine | Admitting: Emergency Medicine

## 2012-11-04 ENCOUNTER — Encounter (HOSPITAL_COMMUNITY): Payer: Self-pay

## 2012-11-04 ENCOUNTER — Observation Stay (HOSPITAL_COMMUNITY): Payer: Medicaid Other

## 2012-11-04 ENCOUNTER — Encounter (HOSPITAL_COMMUNITY): Payer: Self-pay | Admitting: Anesthesiology

## 2012-11-04 ENCOUNTER — Encounter (HOSPITAL_COMMUNITY)
Admission: EM | Disposition: A | Discharge status: Home / Self Care | Payer: Self-pay | Source: Home / Self Care | Attending: Emergency Medicine

## 2012-11-04 ENCOUNTER — Encounter (HOSPITAL_COMMUNITY): Payer: Self-pay | Admitting: *Deleted

## 2012-11-04 ENCOUNTER — Observation Stay (HOSPITAL_COMMUNITY): Payer: Medicaid Other | Admitting: Anesthesiology

## 2012-11-04 DIAGNOSIS — F17200 Nicotine dependence, unspecified, uncomplicated: Secondary | ICD-10-CM | POA: Insufficient documentation

## 2012-11-04 DIAGNOSIS — F309 Manic episode, unspecified: Secondary | ICD-10-CM | POA: Insufficient documentation

## 2012-11-04 DIAGNOSIS — Z9049 Acquired absence of other specified parts of digestive tract: Secondary | ICD-10-CM

## 2012-11-04 DIAGNOSIS — Z9089 Acquired absence of other organs: Secondary | ICD-10-CM | POA: Insufficient documentation

## 2012-11-04 DIAGNOSIS — R109 Unspecified abdominal pain: Secondary | ICD-10-CM

## 2012-11-04 DIAGNOSIS — F341 Dysthymic disorder: Secondary | ICD-10-CM | POA: Insufficient documentation

## 2012-11-04 DIAGNOSIS — K8010 Calculus of gallbladder with chronic cholecystitis without obstruction: Secondary | ICD-10-CM

## 2012-11-04 DIAGNOSIS — Z87410 Personal history of cervical dysplasia: Secondary | ICD-10-CM | POA: Insufficient documentation

## 2012-11-04 DIAGNOSIS — I10 Essential (primary) hypertension: Secondary | ICD-10-CM | POA: Insufficient documentation

## 2012-11-04 DIAGNOSIS — F1120 Opioid dependence, uncomplicated: Secondary | ICD-10-CM | POA: Insufficient documentation

## 2012-11-04 DIAGNOSIS — Z791 Long term (current) use of non-steroidal anti-inflammatories (NSAID): Secondary | ICD-10-CM | POA: Insufficient documentation

## 2012-11-04 DIAGNOSIS — G8918 Other acute postprocedural pain: Secondary | ICD-10-CM | POA: Insufficient documentation

## 2012-11-04 HISTORY — PX: CHOLECYSTECTOMY: SHX55

## 2012-11-04 LAB — CBC
HCT: 37.4 % (ref 36.0–46.0)
MCH: 28.5 pg (ref 26.0–34.0)
MCHC: 32.4 g/dL (ref 30.0–36.0)
MCV: 88 fL (ref 78.0–100.0)
RDW: 13.5 % (ref 11.5–15.5)

## 2012-11-04 LAB — COMPREHENSIVE METABOLIC PANEL
ALT: 7 U/L (ref 0–35)
Albumin: 3.4 g/dL — ABNORMAL LOW (ref 3.5–5.2)
Alkaline Phosphatase: 54 U/L (ref 39–117)
Chloride: 108 mEq/L (ref 96–112)
Potassium: 4.1 mEq/L (ref 3.5–5.1)
Sodium: 139 mEq/L (ref 135–145)
Total Protein: 6.2 g/dL (ref 6.0–8.3)

## 2012-11-04 SURGERY — LAPAROSCOPIC CHOLECYSTECTOMY WITH INTRAOPERATIVE CHOLANGIOGRAM
Anesthesia: General | Site: Abdomen | Wound class: Clean Contaminated

## 2012-11-04 MED ORDER — ACETAMINOPHEN 10 MG/ML IV SOLN
INTRAVENOUS | Status: DC | PRN
  Administered 2012-11-04: 1000 mg via INTRAVENOUS

## 2012-11-04 MED ORDER — DEXAMETHASONE SODIUM PHOSPHATE 10 MG/ML IJ SOLN
INTRAMUSCULAR | Status: DC | PRN
  Administered 2012-11-04: 10 mg via INTRAVENOUS

## 2012-11-04 MED ORDER — MORPHINE SULFATE 4 MG/ML IJ SOLN
6.0000 mg | Freq: Once | INTRAMUSCULAR | Status: AC
  Administered 2012-11-04: 6 mg via INTRAVENOUS
  Filled 2012-11-04: qty 2

## 2012-11-04 MED ORDER — PROPOFOL 10 MG/ML IV BOLUS
INTRAVENOUS | Status: DC | PRN
  Administered 2012-11-04: 180 mg via INTRAVENOUS

## 2012-11-04 MED ORDER — MORPHINE SULFATE 4 MG/ML IJ SOLN
4.0000 mg | Freq: Once | INTRAMUSCULAR | Status: AC
  Administered 2012-11-04: 4 mg via INTRAVENOUS
  Filled 2012-11-04: qty 1

## 2012-11-04 MED ORDER — LACTATED RINGERS IV SOLN
INTRAVENOUS | Status: DC
  Administered 2012-11-04: 12:00:00 via INTRAVENOUS
  Administered 2012-11-04: 1000 mL via INTRAVENOUS

## 2012-11-04 MED ORDER — LIDOCAINE HCL (CARDIAC) 20 MG/ML IV SOLN
INTRAVENOUS | Status: DC | PRN
  Administered 2012-11-04: 50 mg via INTRAVENOUS

## 2012-11-04 MED ORDER — MEPERIDINE HCL 50 MG/ML IJ SOLN: 6.2500 mg | INTRAMUSCULAR | Status: DC | PRN

## 2012-11-04 MED ORDER — KCL IN DEXTROSE-NACL 20-5-0.45 MEQ/L-%-% IV SOLN
INTRAVENOUS | Status: DC
  Administered 2012-11-04: 05:00:00 via INTRAVENOUS
  Filled 2012-11-04 (×3): qty 1000

## 2012-11-04 MED ORDER — BUPIVACAINE-EPINEPHRINE 0.25% -1:200000 IJ SOLN
INTRAMUSCULAR | Status: AC
  Filled 2012-11-04: qty 1

## 2012-11-04 MED ORDER — SODIUM CHLORIDE 0.9 % IV SOLN
INTRAVENOUS | Status: DC | PRN
  Administered 2012-11-04: 11:00:00

## 2012-11-04 MED ORDER — GLYCOPYRROLATE 0.2 MG/ML IJ SOLN
INTRAMUSCULAR | Status: DC | PRN
  Administered 2012-11-04: 0.6 mg via INTRAVENOUS

## 2012-11-04 MED ORDER — SUCCINYLCHOLINE CHLORIDE 20 MG/ML IJ SOLN
INTRAMUSCULAR | Status: DC | PRN
  Administered 2012-11-04: 100 mg via INTRAVENOUS

## 2012-11-04 MED ORDER — ONDANSETRON HCL 4 MG/2ML IJ SOLN: 4.0000 mg | Freq: Four times a day (QID) | INTRAMUSCULAR | Status: DC | PRN

## 2012-11-04 MED ORDER — ONDANSETRON HCL 4 MG/2ML IJ SOLN
INTRAMUSCULAR | Status: DC | PRN
  Administered 2012-11-04: 4 mg via INTRAVENOUS

## 2012-11-04 MED ORDER — HYDROMORPHONE HCL PF 1 MG/ML IJ SOLN
1.0000 mg | INTRAMUSCULAR | Status: DC | PRN
  Administered 2012-11-04: 1 mg via INTRAVENOUS
  Filled 2012-11-04: qty 1

## 2012-11-04 MED ORDER — BUPIVACAINE-EPINEPHRINE 0.25% -1:200000 IJ SOLN
INTRAMUSCULAR | Status: DC | PRN
  Administered 2012-11-04: 50 mL

## 2012-11-04 MED ORDER — MORPHINE SULFATE 2 MG/ML IJ SOLN
1.0000 mg | INTRAMUSCULAR | Status: DC | PRN
  Administered 2012-11-04 (×3): 4 mg via INTRAVENOUS
  Administered 2012-11-04: 2 mg via INTRAVENOUS
  Administered 2012-11-04: 4 mg via INTRAVENOUS
  Administered 2012-11-04: 2 mg via INTRAVENOUS
  Filled 2012-11-04: qty 2
  Filled 2012-11-04: qty 1
  Filled 2012-11-04 (×2): qty 2
  Filled 2012-11-04: qty 1
  Filled 2012-11-04: qty 2

## 2012-11-04 MED ORDER — LACTATED RINGERS IV SOLN
INTRAVENOUS | Status: DC | PRN
  Administered 2012-11-04: 1000 mL via INTRAVENOUS

## 2012-11-04 MED ORDER — LEVOFLOXACIN IN D5W 500 MG/100ML IV SOLN
500.0000 mg | Freq: Every day | INTRAVENOUS | Status: DC
  Administered 2012-11-04: 500 mg via INTRAVENOUS
  Filled 2012-11-04 (×2): qty 100

## 2012-11-04 MED ORDER — FENTANYL CITRATE 0.05 MG/ML IJ SOLN
25.0000 ug | INTRAMUSCULAR | Status: DC | PRN
  Administered 2012-11-04 (×2): 50 ug via INTRAVENOUS

## 2012-11-04 MED ORDER — ROCURONIUM BROMIDE 100 MG/10ML IV SOLN
INTRAVENOUS | Status: DC | PRN
  Administered 2012-11-04: 20 mg via INTRAVENOUS
  Administered 2012-11-04: 5 mg via INTRAVENOUS

## 2012-11-04 MED ORDER — LACTATED RINGERS IV SOLN: INTRAVENOUS | Status: DC

## 2012-11-04 MED ORDER — OXYCODONE-ACETAMINOPHEN 5-325 MG PO TABS: 1.0000 | ORAL_TABLET | ORAL | Status: DC | PRN

## 2012-11-04 MED ORDER — MIDAZOLAM HCL 5 MG/5ML IJ SOLN
INTRAMUSCULAR | Status: DC | PRN
  Administered 2012-11-04: 2 mg via INTRAVENOUS

## 2012-11-04 MED ORDER — ONDANSETRON HCL 4 MG/2ML IJ SOLN
4.0000 mg | Freq: Once | INTRAMUSCULAR | Status: AC
  Administered 2012-11-04: 4 mg via INTRAVENOUS
  Filled 2012-11-04: qty 2

## 2012-11-04 MED ORDER — IBUPROFEN 600 MG PO TABS
600.0000 mg | ORAL_TABLET | Freq: Four times a day (QID) | ORAL | Status: DC | PRN
  Filled 2012-11-04: qty 1

## 2012-11-04 MED ORDER — FENTANYL CITRATE 0.05 MG/ML IJ SOLN
INTRAMUSCULAR | Status: DC | PRN
  Administered 2012-11-04: 100 ug via INTRAVENOUS
  Administered 2012-11-04: 50 ug via INTRAVENOUS
  Administered 2012-11-04: 100 ug via INTRAVENOUS

## 2012-11-04 MED ORDER — ACETAMINOPHEN 10 MG/ML IV SOLN
INTRAVENOUS | Status: AC
  Filled 2012-11-04: qty 100

## 2012-11-04 MED ORDER — FENTANYL CITRATE 0.05 MG/ML IJ SOLN
INTRAMUSCULAR | Status: AC
  Filled 2012-11-04: qty 2

## 2012-11-04 MED ORDER — ONDANSETRON HCL 4 MG PO TABS: 4.0000 mg | ORAL_TABLET | Freq: Four times a day (QID) | ORAL | Status: DC | PRN

## 2012-11-04 MED ORDER — NEOSTIGMINE METHYLSULFATE 1 MG/ML IJ SOLN
INTRAMUSCULAR | Status: DC | PRN
  Administered 2012-11-04: 4 mg via INTRAVENOUS

## 2012-11-04 MED ORDER — IOHEXOL 300 MG/ML  SOLN
INTRAMUSCULAR | Status: AC
  Filled 2012-11-04: qty 1

## 2012-11-04 MED ORDER — PROMETHAZINE HCL 25 MG/ML IJ SOLN: 6.2500 mg | INTRAMUSCULAR | Status: DC | PRN

## 2012-11-04 MED ORDER — NICOTINE 14 MG/24HR TD PT24
14.0000 mg | MEDICATED_PATCH | Freq: Every day | TRANSDERMAL | Status: DC
  Filled 2012-11-04: qty 1

## 2012-11-04 SURGICAL SUPPLY — 42 items
APL SKNCLS STERI-STRIP NONHPOA (GAUZE/BANDAGES/DRESSINGS) ×1
APPLIER CLIP ROT 10 11.4 M/L (STAPLE) ×2
APR CLP MED LRG 11.4X10 (STAPLE) ×1
BAG SPEC RTRVL LRG 6X4 10 (ENDOMECHANICALS) ×1
BENZOIN TINCTURE PRP APPL 2/3 (GAUZE/BANDAGES/DRESSINGS) ×2 IMPLANT
CANISTER SUCTION 2500CC (MISCELLANEOUS) ×2 IMPLANT
CHLORAPREP W/TINT 26ML (MISCELLANEOUS) ×2 IMPLANT
CLIP APPLIE ROT 10 11.4 M/L (STAPLE) ×1 IMPLANT
CLOTH BEACON ORANGE TIMEOUT ST (SAFETY) ×2 IMPLANT
COVER MAYO STAND STRL (DRAPES) ×2 IMPLANT
DECANTER SPIKE VIAL GLASS SM (MISCELLANEOUS) ×2 IMPLANT
DRAPE C-ARM 42X72 X-RAY (DRAPES) ×2 IMPLANT
DRAPE LAPAROSCOPIC ABDOMINAL (DRAPES) ×2 IMPLANT
DRAPE UTILITY XL STRL (DRAPES) ×2 IMPLANT
DRSG TEGADERM 2-3/8X2-3/4 SM (GAUZE/BANDAGES/DRESSINGS) ×6 IMPLANT
DRSG TEGADERM 4X4.75 (GAUZE/BANDAGES/DRESSINGS) ×2 IMPLANT
ELECT REM PT RETURN 9FT ADLT (ELECTROSURGICAL) ×2
ELECTRODE REM PT RTRN 9FT ADLT (ELECTROSURGICAL) ×1 IMPLANT
FILTER SMOKE EVAC LAPAROSHD (FILTER) IMPLANT
GLOVE BIO SURGEON STRL SZ7 (GLOVE) ×2 IMPLANT
GLOVE BIOGEL PI IND STRL 7.0 (GLOVE) ×1 IMPLANT
GLOVE BIOGEL PI IND STRL 7.5 (GLOVE) ×1 IMPLANT
GLOVE BIOGEL PI INDICATOR 7.0 (GLOVE) ×1
GLOVE BIOGEL PI INDICATOR 7.5 (GLOVE) ×1
GOWN STRL NON-REIN LRG LVL3 (GOWN DISPOSABLE) ×5 IMPLANT
GOWN STRL REIN XL XLG (GOWN DISPOSABLE) IMPLANT
KIT BASIN OR (CUSTOM PROCEDURE TRAY) ×2 IMPLANT
NS IRRIG 1000ML POUR BTL (IV SOLUTION) ×2 IMPLANT
POUCH SPECIMEN RETRIEVAL 10MM (ENDOMECHANICALS) ×2 IMPLANT
RINGERS IRRIG 1000ML POUR BTL (IV SOLUTION) ×2 IMPLANT
SET CHOLANGIOGRAPH MIX (MISCELLANEOUS) ×2 IMPLANT
SET IRRIG TUBING LAPAROSCOPIC (IRRIGATION / IRRIGATOR) ×2 IMPLANT
SOLUTION ANTI FOG 6CC (MISCELLANEOUS) ×2 IMPLANT
STRIP CLOSURE SKIN 1/2X4 (GAUZE/BANDAGES/DRESSINGS) ×2 IMPLANT
SUT MNCRL AB 4-0 PS2 18 (SUTURE) ×2 IMPLANT
SUT VICRYL 0 UR6 27IN ABS (SUTURE) ×2 IMPLANT
TOWEL OR 17X26 10 PK STRL BLUE (TOWEL DISPOSABLE) ×2 IMPLANT
TRAY LAP CHOLE (CUSTOM PROCEDURE TRAY) ×2 IMPLANT
TROCAR BLADELESS OPT 5 75 (ENDOMECHANICALS) ×4 IMPLANT
TROCAR XCEL BLUNT TIP 100MML (ENDOMECHANICALS) ×2 IMPLANT
TROCAR XCEL NON-BLD 11X100MML (ENDOMECHANICALS) ×2 IMPLANT
TUBING INSUFFLATION 10FT LAP (TUBING) ×2 IMPLANT

## 2012-11-04 NOTE — ED Notes (Signed)
PT had gallbladder surgery this am and she wanted to go smoke and she was told she had to leave.  Pt is back with abdominal pain from surgical site.  Pt is nauseated

## 2012-11-04 NOTE — Anesthesia Preprocedure Evaluation (Addendum)
Anesthesia Evaluation  Patient identified by MRN, date of birth, ID band Patient awake    Reviewed: Allergy & Precautions, H&P , NPO status , Patient's Chart, lab work & pertinent test results  Airway Mallampati: II TM Distance: >3 FB Neck ROM: Full    Dental No notable dental hx.    Pulmonary neg pulmonary ROS, Current Smoker,  breath sounds clear to auscultation  Pulmonary exam normal       Cardiovascular hypertension, negative cardio ROS  Rhythm:Regular Rate:Normal     Neuro/Psych Anxiety Depression negative neurological ROS  negative psych ROS   GI/Hepatic negative GI ROS, Neg liver ROS, (+)     substance abuse (cocaine use 5 days ago)  cocaine use,   Endo/Other  negative endocrine ROS  Renal/GU negative Renal ROS  negative genitourinary   Musculoskeletal negative musculoskeletal ROS (+)   Abdominal   Peds negative pediatric ROS (+)  Hematology negative hematology ROS (+)   Anesthesia Other Findings   Reproductive/Obstetrics negative OB ROS                          Anesthesia Physical Anesthesia Plan  ASA: II  Anesthesia Plan: General   Post-op Pain Management:    Induction: Intravenous  Airway Management Planned: Oral ETT  Additional Equipment:   Intra-op Plan:   Post-operative Plan: Extubation in OR  Informed Consent: I have reviewed the patients History and Physical, chart, labs and discussed the procedure including the risks, benefits and alternatives for the proposed anesthesia with the patient or authorized representative who has indicated his/her understanding and acceptance.   Dental advisory given  Plan Discussed with: CRNA  Anesthesia Plan Comments:         Anesthesia Quick Evaluation

## 2012-11-04 NOTE — ED Notes (Signed)
MD at bedside.surgeon

## 2012-11-04 NOTE — Progress Notes (Signed)
Patient call Nurse to room and requested that her IV be disconnected, ask patient why she needed her IV disconnect, patient stated, " that she just want it disconnect so she could continue putting on her cloths," Nurse explain to patient that she could not leave the unit to go and smoke," patient stated," l can't go down stairs to walk or go to the cafe, I just want to go and walk," Nurse, " inform patient that she could walk around the unit but that she could not leave the unit," patient verbalize understanding but was very agitated walking back and forth with husband at bedside, Dr notified of patient's behavior, patient in stable condition at this time

## 2012-11-04 NOTE — Op Note (Signed)
Laparoscopic Cholecystectomy with IOC Procedure Note  Indications: This patient presents with symptomatic gallbladder disease and will undergo laparoscopic cholecystectomy.  Pre-operative Diagnosis: Calculus of gallbladder with acute cholecystitis, without mention of obstruction  Post-operative Diagnosis: Same  Surgeon: Malcome Ambrocio K.   Assistants: none  Anesthesia: General endotracheal anesthesia  ASA Class: 2  Procedure Details  The patient was seen again in the Holding Room. The risks, benefits, complications, treatment options, and expected outcomes were discussed with the patient. The possibilities of reaction to medication, pulmonary aspiration, perforation of viscus, bleeding, recurrent infection, finding a normal gallbladder, the need for additional procedures, failure to diagnose a condition, the possible need to convert to an open procedure, and creating a complication requiring transfusion or operation were discussed with the patient. The likelihood of improving the patient's symptoms with return to their baseline status is good.  The patient and/or family concurred with the proposed plan, giving informed consent. The site of surgery properly noted. The patient was taken to Operating Room, identified as Rhonda Cabrera and the procedure verified as Laparoscopic Cholecystectomy with Intraoperative Cholangiogram. A Time Out was held and the above information confirmed.  Prior to the induction of general anesthesia, antibiotic prophylaxis was administered. General endotracheal anesthesia was then administered and tolerated well. After the induction, the abdomen was prepped with Chloraprep and draped in the sterile fashion. The patient was positioned in the supine position.  Local anesthetic agent was injected into the skin near the umbilicus and an incision made. We dissected down to the abdominal fascia with blunt dissection.  The fascia was incised vertically and we entered the  peritoneal cavity bluntly.  A pursestring suture of 0-Vicryl was placed around the fascial opening.  The Hasson cannula was inserted and secured with the stay suture.  Pneumoperitoneum was then created with CO2 and tolerated well without any adverse changes in the patient's vital signs. An 11-mm port was placed in the subxiphoid position.  Two 5-mm ports were placed in the right upper quadrant. All skin incisions were infiltrated with a local anesthetic agent before making the incision and placing the trocars.   We positioned the patient in reverse Trendelenburg, tilted slightly to the patient's left.  The gallbladder was identified, the fundus grasped and retracted cephalad. Adhesions were lysed bluntly and with the electrocautery where indicated, taking care not to injure any adjacent organs or viscus. The infundibulum was grasped and retracted laterally, exposing the peritoneum overlying the triangle of Calot. This was then divided and exposed in a blunt fashion. A critical view of the cystic duct and cystic artery was obtained.  The cystic duct was clearly identified and bluntly dissected circumferentially. The cystic duct was ligated with a clip distally.   An incision was made in the cystic duct and the [facility] cholangiogram catheter introduced. The catheter was secured using a clip. A cholangiogram was then obtained which showed good visualization of the distal and proximal biliary tree with no sign of filling defects or obstruction.  Contrast flowed easily into the duodenum. The catheter was then removed.   The cystic duct was then ligated with clips and divided. The cystic artery was identified, dissected free, ligated with clips and divided as well.   The gallbladder was dissected from the liver bed in retrograde fashion with the electrocautery. The gallbladder was removed and placed in an Endocatch sac. The liver bed was irrigated and inspected. Hemostasis was achieved with the electrocautery. Copious  irrigation was utilized and was repeatedly aspirated until clear.  The gallbladder and Endocatch sac were then removed through the umbilical port site.  The pursestring suture was used to close the umbilical fascia.    We again inspected the right upper quadrant for hemostasis.  Pneumoperitoneum was released as we removed the trocars.  4-0 Monocryl was used to close the skin.   Benzoin, steri-strips, and clean dressings were applied. The patient was then extubated and brought to the recovery room in stable condition. Instrument, sponge, and needle counts were correct at closure and at the conclusion of the case.   Findings: Cholecystitis with Cholelithiasis  Estimated Blood Loss: Minimal         Drains: none         Specimens: Gallbladder           Complications: None; patient tolerated the procedure well.         Disposition: PACU - hemodynamically stable.         Condition: stable  Wilmon Arms. Corliss Skains, MD, [facility] Surgery  11/04/2012 12:06 PM

## 2012-11-04 NOTE — Progress Notes (Signed)
Patient explained hospital policy about smoking several times today. Explained to her that she was not allowed to leave the floor to smoke. She was rude and disrespectful and stated she had rights. I told her I understood but hospital policy was not to leave the floor to smoke. Patient continued to leave the floor. I followed the patient to the basement, notified security and house coverage. Patient refused to let me discontinue her IV, refused to sign AMA papers. Escorted out by security and [employer]. Discharged AMA notified MD on call.

## 2012-11-04 NOTE — H&P (Signed)
Reason for Consult:abdominal pain Referring Physician: Keviana Cabrera is an 26 y.o. female.  HPI: surgery was asked to evaluate this patient for possible acute cholecystitis. She says that she has had upper abdominal discomfort over the last month which has been brought on by eating. She says that this pain has been transient and has come and gone. She ate a hamburger yesterday evening at about 6:00 in she had an acute onset of epigastric abdominal pain similar to her other episodes but she says that this has not let up. She has some radiation to the back and some nausea her symptoms were not relieved by vomiting. She says this feels like a "bubble".  She is not getting much relief with pain meds in ER. No fevers.  Bowels normal.  Past Medical History  Diagnosis Date  . Bipolar 1 disorder   . Opiate addiction   . Abnormal Pap smear   . Depression   . Polysubstance abuse   . Hypertension     Past Surgical History  Procedure Date  . Vaginal deliveries   . Tubal ligation 09/08/2011    Procedure: POST PARTUM TUBAL LIGATION;  Surgeon: Rhonda Darter, MD;  Location: [facility];  Service: Gynecology;  Laterality: Bilateral;    Family History  Problem Relation Age of Onset  . Drug abuse Mother   . Drug abuse Father   . Diabetes Maternal Grandmother   . Hypertension Maternal Grandmother   . Diabetes Paternal Grandmother   . Diabetes Paternal Grandfather     Social History:  reports that she has been smoking Cigarettes.  She has been smoking about 1 pack per day. She has never used smokeless tobacco. She reports that she does not drink alcohol or use illicit drugs.  Allergies: No Known Allergies  Medications: I have reviewed the patient's current medications.  Results for orders placed during the hospital encounter of 11/03/12 (from the past 48 hour(s))  URINALYSIS, ROUTINE W REFLEX MICROSCOPIC     Status: Abnormal   Collection Time   11/03/12 10:38 PM      Component Value Range  Comment   Color, Urine YELLOW  YELLOW    APPearance CLOUDY (*) CLEAR    Specific Gravity, Urine 1.030  1.005 - 1.030    pH 7.0  5.0 - 8.0    Glucose, UA NEGATIVE  NEGATIVE mg/dL    Hgb urine dipstick NEGATIVE  NEGATIVE    Bilirubin Urine NEGATIVE  NEGATIVE    Ketones, ur NEGATIVE  NEGATIVE mg/dL    Protein, ur NEGATIVE  NEGATIVE mg/dL    Urobilinogen, UA 1.0  0.0 - 1.0 mg/dL    Nitrite NEGATIVE  NEGATIVE    Leukocytes, UA NEGATIVE  NEGATIVE MICROSCOPIC NOT DONE ON URINES WITH NEGATIVE PROTEIN, BLOOD, LEUKOCYTES, NITRITE, OR GLUCOSE <1000 mg/dL.  PREGNANCY, URINE     Status: Normal   Collection Time   11/03/12 10:38 PM      Component Value Range Comment   Preg Test, Ur NEGATIVE  NEGATIVE   WET PREP, GENITAL     Status: Abnormal   Collection Time   11/03/12 10:42 PM      Component Value Range Comment   Yeast Wet Prep HPF POC NONE SEEN  NONE SEEN    Trich, Wet Prep NONE SEEN  NONE SEEN    Clue Cells Wet Prep HPF POC NONE SEEN  NONE SEEN    WBC, Wet Prep HPF POC FEW (*) NONE SEEN   CBC WITH  DIFFERENTIAL     Status: Normal   Collection Time   11/03/12 11:19 PM      Component Value Range Comment   WBC 8.0  4.0 - 10.5 K/uL    RBC 4.45  3.87 - 5.11 MIL/uL    Hemoglobin 12.7  12.0 - 15.0 g/dL    HCT 09.8  11.9 - 14.7 %    MCV 87.4  78.0 - 100.0 fL    MCH 28.5  26.0 - 34.0 pg    MCHC 32.6  30.0 - 36.0 g/dL    RDW 82.9  56.2 - 13.0 %    Platelets 320  150 - 400 K/uL    Neutrophils Relative 54  43 - 77 %    Neutro Abs 4.3  1.7 - 7.7 K/uL    Lymphocytes Relative 36  12 - 46 %    Lymphs Abs 2.9  0.7 - 4.0 K/uL    Monocytes Relative 6  3 - 12 %    Monocytes Absolute 0.5  0.1 - 1.0 K/uL    Eosinophils Relative 5  0 - 5 %    Eosinophils Absolute 0.4  0.0 - 0.7 K/uL    Basophils Relative 0  0 - 1 %    Basophils Absolute 0.0  0.0 - 0.1 K/uL   LIPASE, BLOOD     Status: Normal   Collection Time   11/03/12 11:19 PM      Component Value Range Comment   Lipase 21  11 - 59 U/L     COMPREHENSIVE METABOLIC PANEL     Status: Abnormal   Collection Time   11/03/12 11:19 PM      Component Value Range Comment   Sodium 138  135 - 145 mEq/L    Potassium 3.5  3.5 - 5.1 mEq/L    Chloride 103  96 - 112 mEq/L    CO2 24  19 - 32 mEq/L    Glucose, Bld 113 (*) 70 - 99 mg/dL    BUN 17  6 - 23 mg/dL    Creatinine, Ser 8.65  0.50 - 1.10 mg/dL    Calcium 9.3  8.4 - 78.4 mg/dL    Total Protein 7.1  6.0 - 8.3 g/dL    Albumin 3.7  3.5 - 5.2 g/dL    AST 14  0 - 37 U/L    ALT 9  0 - 35 U/L    Alkaline Phosphatase 65  39 - 117 U/L    Total Bilirubin 0.3  0.3 - 1.2 mg/dL    GFR calc non Af Amer 72 (*) >90 mL/min    GFR calc Af Amer 83 (*) >90 mL/min     US Abdomen Complete  11/04/2012  *RADIOLOGY REPORT*  Clinical Data:  Abdominal pain  COMPLETE ABDOMINAL ULTRASOUND  Comparison:  None.  Findings:  Gallbladder:  Seven gallstones limits visualization of the gallbladder.  There is questionable mild wall thickening at 3 mm. Per the sonographer, positive Murphy's sign.  Common bile duct:  Measures up to 3 mm within the mid duct.  The distal duct is partially obscured.  Liver:  No focal lesion identified.  Within normal limits in parenchymal echogenicity.  IVC:  Appears normal.  Pancreas:  No focal abnormality seen.  Spleen:  Measures 9 cm.  No focal abnormality.  Right Kidney:  Measures 12 cm.  No hydronephrosis or focal abnormality.  Left Kidney:  Measures 11 cm.  No hydronephrosis or focal abnormality.  Abdominal aorta:  No aneurysm identified.  IMPRESSION: Gallstones and upper normal to mildly increased gallbladder wall thickness. Per the sonographer, positive Murphy's sign. Acute cholecystitis not excluded.  Correlate with clinical examination and HIDA scan if warranted.   Original Report Authenticated By: Jearld Lesch, M.D.    All other review of systems negative or noncontributory except as stated in the HPI  Blood pressure 104/60, pulse 54, temperature 98.5 F (36.9 C), temperature  source Oral, resp. rate 16, height 5\' 8"  (1.727 m), weight 150 lb (68.04 kg), last menstrual period 10/21/2012, SpO2 100.00%, not currently breastfeeding. General appearance: alert, cooperative and no distress Head: Normocephalic, without obvious abnormality, atraumatic Eyes: negative Neck: no JVD and supple, symmetrical, trachea midline Resp: nonlabored Cardio: normal rate, regular GI: soft, focal RUQ tenderness, ND, no peritoneal signs Extremities: extremities normal, atraumatic, no cyanosis or edema Pulses: 2+ and symmetric Neurologic: Grossly normal  Assessment/Plan: Abdominal pain and cholelithiasis She has regular quadrant pain consistent with symptomatic cholelithiasis.  She has cholelithiasis on ultrasound. Her white blood cell count and LFTs are normal but since she continues to have persistent right upper quadrant pain and no relief with analgesics given in the emergency room, she will likely need admission and cholecystectomy. We discussed the risks of the procedure. The risks of infection, bleeding, pain, persistent symptoms, scarring, injury to bowel or bile ducts, retained stone, diarrhea, need for additional procedures, and need for open surgery discussed with the patient. She desires to proceed. We will plan for admission with IV hydration, and n.p.o. And plan for surgery  Rhonda Cabrera DAVID 11/04/2012, 2:21 AM

## 2012-11-04 NOTE — Transfer of Care (Signed)
Immediate Anesthesia Transfer of Care Note  Patient: Rhonda Cabrera  Procedure(s) Performed: Procedure(s) (LRB) with comments: LAPAROSCOPIC CHOLECYSTECTOMY WITH INTRAOPERATIVE CHOLANGIOGRAM (N/A)  Patient Location: PACU  Anesthesia Type:General  Level of Consciousness: awake, alert  and oriented  Airway & Oxygen Therapy: Patient Spontanous Breathing and Patient connected to face mask oxygen  Post-op Assessment: Report given to PACU RN and Post -op Vital signs reviewed and stable  Post vital signs: Reviewed and stable  Complications: No apparent anesthesia complications

## 2012-11-04 NOTE — Anesthesia Postprocedure Evaluation (Signed)
  Anesthesia Post-op Note  Patient: Rhonda Cabrera  Procedure(s) Performed: Procedure(s) (LRB): LAPAROSCOPIC CHOLECYSTECTOMY WITH INTRAOPERATIVE CHOLANGIOGRAM (N/A)  Patient Location: PACU  Anesthesia Type: General  Level of Consciousness: awake and alert   Airway and Oxygen Therapy: Patient Spontanous Breathing  Post-op Pain: mild  Post-op Assessment: Post-op Vital signs reviewed, Patient's Cardiovascular Status Stable, Respiratory Function Stable, Patent Airway and No signs of Nausea or vomiting  Post-op Vital Signs: stable  Complications: No apparent anesthesia complications

## 2012-11-04 NOTE — Care Management Note (Signed)
    Page 1 of 1   11/04/2012     2:05:29 PM   CARE MANAGEMENT NOTE 11/04/2012  Patient:  Rhonda Cabrera, Rhonda Cabrera   Account Number:  [id number]  Date Initiated:  11/04/2012  Documentation initiated by:  Lorenda Ishihara  Subjective/Objective Assessment:   26 yo female admitted with abd pain, now s/p lap chole. PTA lived at home with spouse.     Action/Plan:   home when stable   Anticipated DC Date:  11/05/2012   Anticipated DC Plan:  HOME/SELF CARE      DC Planning Services  CM consult      Choice offered to / List presented to:             Status of service:  Completed, signed off Medicare Important Message given?   (If response is "NO", the following Medicare IM given date fields will be blank) Date Medicare IM given:   Date Additional Medicare IM given:    Discharge Disposition:  HOME/SELF CARE  Per UR Regulation:  Reviewed for med. necessity/level of care/duration of stay  If discussed at Long Length of Stay Meetings, dates discussed:    Comments:

## 2012-11-04 NOTE — ED Provider Notes (Signed)
I assumed care at sign out. Rhonda Cabrera is a 26 y.o. female here with RUQ pain and vaginal bleeding. Dr. Rubin Payor performed vaginal exam and it was unremarkable. US showed + cholelithiasis with thickened gallbladder wall and positive murphy sign concerning for cholecystitis. Surgery evaluated the patient and will admit for cholecystectomy.   Results for orders placed during the hospital encounter of 11/03/12  CBC WITH DIFFERENTIAL      Component Value Range   WBC 8.0  4.0 - 10.5 K/uL   RBC 4.45  3.87 - 5.11 MIL/uL   Hemoglobin 12.7  12.0 - 15.0 g/dL   HCT 16.1  09.6 - 04.5 %   MCV 87.4  78.0 - 100.0 fL   MCH 28.5  26.0 - 34.0 pg   MCHC 32.6  30.0 - 36.0 g/dL   RDW 40.9  81.1 - 91.4 %   Platelets 320  150 - 400 K/uL   Neutrophils Relative 54  43 - 77 %   Neutro Abs 4.3  1.7 - 7.7 K/uL   Lymphocytes Relative 36  12 - 46 %   Lymphs Abs 2.9  0.7 - 4.0 K/uL   Monocytes Relative 6  3 - 12 %   Monocytes Absolute 0.5  0.1 - 1.0 K/uL   Eosinophils Relative 5  0 - 5 %   Eosinophils Absolute 0.4  0.0 - 0.7 K/uL   Basophils Relative 0  0 - 1 %   Basophils Absolute 0.0  0.0 - 0.1 K/uL  LIPASE, BLOOD      Component Value Range   Lipase 21  11 - 59 U/L  COMPREHENSIVE METABOLIC PANEL      Component Value Range   Sodium 138  135 - 145 mEq/L   Potassium 3.5  3.5 - 5.1 mEq/L   Chloride 103  96 - 112 mEq/L   CO2 24  19 - 32 mEq/L   Glucose, Bld 113 (*) 70 - 99 mg/dL   BUN 17  6 - 23 mg/dL   Creatinine, Ser 7.82  0.50 - 1.10 mg/dL   Calcium 9.3  8.4 - 95.6 mg/dL   Total Protein 7.1  6.0 - 8.3 g/dL   Albumin 3.7  3.5 - 5.2 g/dL   AST 14  0 - 37 U/L   ALT 9  0 - 35 U/L   Alkaline Phosphatase 65  39 - 117 U/L   Total Bilirubin 0.3  0.3 - 1.2 mg/dL   GFR calc non Af Amer 72 (*) >90 mL/min   GFR calc Af Amer 83 (*) >90 mL/min  URINALYSIS, ROUTINE W REFLEX MICROSCOPIC      Component Value Range   Color, Urine YELLOW  YELLOW   APPearance CLOUDY (*) CLEAR   Specific Gravity, Urine 1.030   1.005 - 1.030   pH 7.0  5.0 - 8.0   Glucose, UA NEGATIVE  NEGATIVE mg/dL   Hgb urine dipstick NEGATIVE  NEGATIVE   Bilirubin Urine NEGATIVE  NEGATIVE   Ketones, ur NEGATIVE  NEGATIVE mg/dL   Protein, ur NEGATIVE  NEGATIVE mg/dL   Urobilinogen, UA 1.0  0.0 - 1.0 mg/dL   Nitrite NEGATIVE  NEGATIVE   Leukocytes, UA NEGATIVE  NEGATIVE  PREGNANCY, URINE      Component Value Range   Preg Test, Ur NEGATIVE  NEGATIVE  WET PREP, GENITAL      Component Value Range   Yeast Wet Prep HPF POC NONE SEEN  NONE SEEN   Trich, Wet Prep NONE  SEEN  NONE SEEN   Clue Cells Wet Prep HPF POC NONE SEEN  NONE SEEN   WBC, Wet Prep HPF POC FEW (*) NONE SEEN   US Abdomen Complete  11/04/2012  *RADIOLOGY REPORT*  Clinical Data:  Abdominal pain  COMPLETE ABDOMINAL ULTRASOUND  Comparison:  None.  Findings:  Gallbladder:  Seven gallstones limits visualization of the gallbladder.  There is questionable mild wall thickening at 3 mm. Per the sonographer, positive Murphy's sign.  Common bile duct:  Measures up to 3 mm within the mid duct.  The distal duct is partially obscured.  Liver:  No focal lesion identified.  Within normal limits in parenchymal echogenicity.  IVC:  Appears normal.  Pancreas:  No focal abnormality seen.  Spleen:  Measures 9 cm.  No focal abnormality.  Right Kidney:  Measures 12 cm.  No hydronephrosis or focal abnormality.  Left Kidney:  Measures 11 cm.  No hydronephrosis or focal abnormality.  Abdominal aorta:  No aneurysm identified.  IMPRESSION: Gallstones and upper normal to mildly increased gallbladder wall thickness. Per the sonographer, positive Murphy's sign. Acute cholecystitis not excluded.  Correlate with clinical examination and HIDA scan if warranted.   Original Report Authenticated By: Jearld Lesch, M.D.        Richardean Canal, MD 11/04/12 [phone]

## 2012-11-04 NOTE — ED Notes (Signed)
Pt. Escorted to waiting room. Unsteady gait. Given warm blanket.

## 2012-11-04 NOTE — Progress Notes (Signed)
Patient walking around hallway with cloths on, hat, coat, and leggings, patient headed for elevator and was notice by staff, Charge Nurse approached patient and inform patient that she could not leave the unit, explain to patient that was welcome to leave AMA, but patient refuse to sign the AMA form, Charge Nurse followed patient down to first floor were patient began to try and go outside to smoke a cigarette, at this time Charge inform patient that she could not go outside and if she did than she would be leaving the hospital against medical advise, patient would not follow the command of the Charge Nurse and Security was call to assist with this patient, patient still had IV in Left AC and refuse to let the Charge Nurse remove IV, Security and [facility] was talking with patient when I arrived down on the scene in front of the hospital, at the time patient agreed to come back in the hospital to let us remove the IV, while in the ED unit to remove the IV patient became agitated and began to leave and would not let the staff remove the IV, outside after several minutes passed patient still would not let staff remove IV so hospital police and [facility] were called on the scene, at this time I Nurse Rhonda Cabrera inform patient that this was getting out of hand all because you refuse to let us remove the IV, patient finally agreed to let me remove the IV after about 20 minutes later

## 2012-11-04 NOTE — ED Provider Notes (Signed)
History     CSN: [id number]  Arrival date & time 11/04/12  1807   First MD Initiated Contact with Patient 11/04/12 2246      Chief Complaint  Patient presents with  . Post chole this am     (Consider location/radiation/quality/duration/timing/severity/associated sxs/prior treatment) HPI Comments: Patient presents today with a chief complaint of abdominal pain.  Pain is generalized.  She describes the pain as a soreness feeling and states that she feels bloated.  Patient had a laparoscopic cholecystectomy done earlier today by General Surgery.  From reading the Operative note she did not seem to have any complications with the surgery.  She then left AMA this afternoon because she wanted to go smoke.  Patient apparently was escorted out by Police.  Patient states that she did not take anything for pain at home prior to arrival in the ED.  She denies nausea or vomiting.   No fever or chills.  She has been able to tolerate po liquids since leaving the hospital.  She was not given any prescriptions for pain medication or antiemetics prior to leaving the hospital.   The history is provided by the patient and medical records.    Past Medical History  Diagnosis Date  . Bipolar 1 disorder   . Opiate addiction   . Abnormal Pap smear   . Depression   . Polysubstance abuse   . Hypertension   . Anxiety     Past Surgical History  Procedure Date  . Vaginal deliveries   . Tubal ligation 09/08/2011    Procedure: POST PARTUM TUBAL LIGATION;  Surgeon: Scheryl Darter, MD;  Location: [facility];  Service: Gynecology;  Laterality: Bilateral;  . Cholecystectomy     Family History  Problem Relation Age of Onset  . Drug abuse Mother   . Drug abuse Father   . Diabetes Maternal Grandmother   . Hypertension Maternal Grandmother   . Diabetes Paternal Grandmother   . Diabetes Paternal Grandfather     History  Substance Use Topics  . Smoking status: Current Every Day Smoker -- 1.0 packs/day    Types:  Cigarettes  . Smokeless tobacco: Never Used  . Alcohol Use: No    OB History    Grav Para Term Preterm Abortions TAB SAB Ect Mult Living   7 6 3 3 1 1   1 6       Review of Systems  Constitutional: Negative for fever and chills.  Gastrointestinal: Positive for abdominal pain. Negative for nausea, vomiting, diarrhea, constipation and abdominal distention.  All other systems reviewed and are negative.    Allergies  Review of patient's allergies indicates no known allergies.  Home Medications   Current Outpatient Rx  Name  Route  Sig  Dispense  Refill  . BISACODYL 5 MG PO TBEC   Oral   Take 5 mg by mouth daily as needed. For constipation         . BISMUTH SUBSALICYLATE 262 MG/15ML PO SUSP   Oral   Take 15 mLs by mouth every 6 (six) hours as needed. For upset stomach         . CALCIUM CARBONATE ANTACID 500 MG PO CHEW   Oral   Chew 1 tablet by mouth 3 (three) times daily as needed. For upset stomach         . ADULT MULTIVITAMIN W/MINERALS CH   Oral   Take 1 tablet by mouth daily.         Marland Kitchen NAPROXEN  SODIUM 220 MG PO TABS   Oral   Take 220 mg by mouth 2 (two) times daily as needed. For pain         . SIMETHICONE 125 MG PO CHEW   Oral   Chew 125 mg by mouth every 6 (six) hours as needed. For gas           BP 135/79  Pulse 107  Temp 99.1 F (37.3 C) (Oral)  Resp 18  SpO2 100%  LMP 10/21/2012  Breastfeeding? Unknown  Physical Exam  Nursing note and vitals reviewed. Constitutional: She appears well-developed and well-nourished. No distress.  HENT:  Head: Normocephalic and atraumatic.  Mouth/Throat: Oropharynx is clear and moist.  Neck: Normal range of motion. Neck supple.  Cardiovascular: Normal rate, regular rhythm and normal heart sounds.   Pulmonary/Chest: Effort normal and breath sounds normal. She has no wheezes. She has no rales.  Abdominal: Soft. Bowel sounds are normal. She exhibits no distension. There is generalized tenderness. There is  no rebound and no guarding.  Neurological: She is alert.  Skin: Skin is warm and dry. She is not diaphoretic.  Psychiatric: She has a normal mood and affect.    ED Course  Procedures (including critical care time)  Labs Reviewed - No data to display Dg Cholangiogram Operative  11/04/2012  *RADIOLOGY REPORT*  Clinical Data:   Gallstones.  Acute cholecystitis.  INTRAOPERATIVE CHOLANGIOGRAM  Technique:  Cholangiographic images from the C-arm fluoroscopic device were submitted for interpretation post-operatively.  Please see the procedural report for the amount of contrast and the fluoroscopy time utilized.  Comparison:  11/03/2012  Findings:  The gallbladder has been removed and the cystic duct cannulated.  There is good opacification of the biliary tree.  No retained stones are seen. There is a transited filling defect in the proximal common bile duct which is mobile likely an air bubble or mucosal fold.  There is prompt passage of contrast into the duodenum.  IMPRESSION: Negative operative cholangiogram.   Original Report Authenticated By: Davonna Belling, M.D.    US Abdomen Complete  11/04/2012  *RADIOLOGY REPORT*  Clinical Data:  Abdominal pain  COMPLETE ABDOMINAL ULTRASOUND  Comparison:  None.  Findings:  Gallbladder:  Seven gallstones limits visualization of the gallbladder.  There is questionable mild wall thickening at 3 mm. Per the sonographer, positive Murphy's sign.  Common bile duct:  Measures up to 3 mm within the mid duct.  The distal duct is partially obscured.  Liver:  No focal lesion identified.  Within normal limits in parenchymal echogenicity.  IVC:  Appears normal.  Pancreas:  No focal abnormality seen.  Spleen:  Measures 9 cm.  No focal abnormality.  Right Kidney:  Measures 12 cm.  No hydronephrosis or focal abnormality.  Left Kidney:  Measures 11 cm.  No hydronephrosis or focal abnormality.  Abdominal aorta:  No aneurysm identified.  IMPRESSION: Gallstones and upper normal to mildly  increased gallbladder wall thickness. Per the sonographer, positive Murphy's sign. Acute cholecystitis not excluded.  Correlate with clinical examination and HIDA scan if warranted.   Original Report Authenticated By: Jearld Lesch, M.D.      No diagnosis found.  Discussed with Surgeon on call.  He recommends having the patient follow up in 2 weeks.  MDM  Patient presenting today due to abdominal pain.  She had a lap chole performed earlier today.  She left the hospital AMA this morning because she wanted to smoke.  She was not given a  prescription for any pain medication when she left.  Patient given two doses of IV Morphine while in the ED and her pain was controlled.  Patient able to tolerate po liquids.  Therefore, feel that patient can be discharged home.  Patient given prescription for Phenergan and Percocet.  Patient instructed to follow up with surgeon in 2 weeks.  Return precautions discussed with the patient.          Pascal Lux Lawton, PA-C 11/05/12 1510

## 2012-11-04 NOTE — Progress Notes (Signed)
  Subjective: Patient feels better this AM with IV pain medicine.  Objective: Vital signs in last 24 hours: Temp:  [97.6 F (36.4 C)-98.8 F (37.1 C)] 97.6 F (36.4 C) (11/18 0520) Pulse Rate:  [54-103] 65  (11/18 0520) Resp:  [16-22] 18  (11/18 0520) BP: (81-121)/(44-71) 99/59 mmHg (11/18 0539) SpO2:  [95 %-100 %] 95 % (11/18 0520) Weight:  [150 lb (68.04 kg)-150 lb 12.8 oz (68.402 kg)] 150 lb 12.8 oz (68.402 kg) (11/18 0350) Last BM Date: 11/04/12  Intake/Output from previous day: 11/17 0701 - 11/18 0700 In: 247.9 [I.V.:147.9; IV Piggyback:100] Out: 100 [Urine:100] Intake/Output this shift:    GI: soft, non-tender; bowel sounds normal; no masses,  no organomegaly  Lab Results:   [facility] 11/04/12 0513 11/03/12 2319  WBC 7.1 8.0  HGB 12.1 12.7  HCT 37.4 38.9  PLT 296 320   BMET  Basename 11/04/12 0513 11/03/12 2319  NA 139 138  K 4.1 3.5  CL 108 103  CO2 24 24  GLUCOSE 101* 113*  BUN 15 17  CREATININE 0.91 1.06  CALCIUM 8.6 9.3   PT/INR No results found for this basename: LABPROT:2,INR:2 in the last 72 hours ABG No results found for this basename: PHART:2,PCO2:2,PO2:2,HCO3:2 in the last 72 hours  Studies/Results: US Abdomen Complete  11/04/2012  *RADIOLOGY REPORT*  Clinical Data:  Abdominal pain  COMPLETE ABDOMINAL ULTRASOUND  Comparison:  None.  Findings:  Gallbladder:  Seven gallstones limits visualization of the gallbladder.  There is questionable mild wall thickening at 3 mm. Per the sonographer, positive Murphy's sign.  Common bile duct:  Measures up to 3 mm within the mid duct.  The distal duct is partially obscured.  Liver:  No focal lesion identified.  Within normal limits in parenchymal echogenicity.  IVC:  Appears normal.  Pancreas:  No focal abnormality seen.  Spleen:  Measures 9 cm.  No focal abnormality.  Right Kidney:  Measures 12 cm.  No hydronephrosis or focal abnormality.  Left Kidney:  Measures 11 cm.  No hydronephrosis or focal abnormality.   Abdominal aorta:  No aneurysm identified.  IMPRESSION: Gallstones and upper normal to mildly increased gallbladder wall thickness. Per the sonographer, positive Murphy's sign. Acute cholecystitis not excluded.  Correlate with clinical examination and HIDA scan if warranted.   Original Report Authenticated By: Jearld Lesch, M.D.     Anti-infectives: Anti-infectives     Start     Dose/Rate Route Frequency Ordered Stop   11/04/12 0400   levofloxacin (LEVAQUIN) IVPB 500 mg        500 mg 100 mL/hr over 60 Minutes Intravenous Daily before breakfast 11/04/12 0347            Assessment/Plan: Laparoscopic cholecystectomy with intraoperative cholangiogram. The surgical procedure has been discussed with the patient.  Potential risks, benefits, alternative treatments, and expected outcomes have been explained.  All of the patient's questions at this time have been answered.  The likelihood of reaching the patient's treatment goal is good.  The patient understand the proposed surgical procedure and wishes to proceed.   Malu Pellegrini K. 11/04/2012

## 2012-11-05 ENCOUNTER — Encounter (HOSPITAL_COMMUNITY): Payer: Self-pay | Admitting: Surgery

## 2012-11-05 ENCOUNTER — Telehealth (INDEPENDENT_AMBULATORY_CARE_PROVIDER_SITE_OTHER): Payer: Self-pay | Admitting: General Surgery

## 2012-11-05 MED ORDER — MORPHINE SULFATE 4 MG/ML IJ SOLN
4.0000 mg | Freq: Once | INTRAMUSCULAR | Status: AC
  Administered 2012-11-05: 4 mg via INTRAVENOUS
  Filled 2012-11-05: qty 1

## 2012-11-05 MED ORDER — OXYCODONE-ACETAMINOPHEN 5-325 MG PO TABS: 1.0000 | ORAL_TABLET | Freq: Four times a day (QID) | ORAL | Status: DC | PRN

## 2012-11-05 MED ORDER — OXYCODONE-ACETAMINOPHEN 5-325 MG PO TABS
2.0000 | ORAL_TABLET | Freq: Once | ORAL | Status: AC
  Administered 2012-11-05: 2 via ORAL
  Filled 2012-11-05: qty 2

## 2012-11-05 MED ORDER — PROMETHAZINE HCL 25 MG PO TABS: 25.0000 mg | ORAL_TABLET | Freq: Four times a day (QID) | ORAL | Status: DC | PRN

## 2012-11-05 NOTE — Telephone Encounter (Signed)
Pt called to go over removal of bandage and showering following her lap chole surgery.  She understands all instructions to shower (no tub soaks) and pat dry.  She will call back with any further questions or concerns.

## 2012-11-06 ENCOUNTER — Telehealth (INDEPENDENT_AMBULATORY_CARE_PROVIDER_SITE_OTHER): Payer: Self-pay | Admitting: General Surgery

## 2012-11-06 NOTE — Telephone Encounter (Signed)
Pt called earlier today to report that her Oxycodone was going to be finished in 1-2 days and wanted to request a refill, she is taking 2 tabs q 6hrs wih some breakthrough. He pain is located around umbilicus.  No drainage or redness noted/ no nausea/vomiting and bowels are moving. I reviewed this with dr. Corliss Skains and he said I could order Hydrocodone 5/325/ post-op protocol. Pt encouraged to take advil or aleve between pain medication dosing// pt will call if pain medication not helping/ Hydrocodone 5/325 #30 called to [facility] [address] per pt request/ [phone]/pt aware/gy

## 2012-11-07 NOTE — ED Provider Notes (Signed)
Medical screening examination/treatment/procedure(s) were performed by non-physician practitioner and as supervising physician I was immediately available for consultation/collaboration.   Richardean Canal, MD 11/07/12 [phone]

## 2012-11-09 ENCOUNTER — Emergency Department (HOSPITAL_COMMUNITY)
Admission: EM | Admit: 2012-11-09 | Discharge: 2012-11-10 | Disposition: A | Discharge status: Home / Self Care | Payer: Medicaid Other | Attending: Emergency Medicine | Admitting: Emergency Medicine

## 2012-11-09 ENCOUNTER — Encounter (HOSPITAL_COMMUNITY): Payer: Self-pay | Admitting: Emergency Medicine

## 2012-11-09 ENCOUNTER — Emergency Department (HOSPITAL_COMMUNITY): Payer: Medicaid Other

## 2012-11-09 DIAGNOSIS — R109 Unspecified abdominal pain: Secondary | ICD-10-CM

## 2012-11-09 DIAGNOSIS — F17200 Nicotine dependence, unspecified, uncomplicated: Secondary | ICD-10-CM | POA: Insufficient documentation

## 2012-11-09 DIAGNOSIS — F1120 Opioid dependence, uncomplicated: Secondary | ICD-10-CM | POA: Insufficient documentation

## 2012-11-09 DIAGNOSIS — I10 Essential (primary) hypertension: Secondary | ICD-10-CM | POA: Insufficient documentation

## 2012-11-09 DIAGNOSIS — F1910 Other psychoactive substance abuse, uncomplicated: Secondary | ICD-10-CM | POA: Insufficient documentation

## 2012-11-09 DIAGNOSIS — Z9089 Acquired absence of other organs: Secondary | ICD-10-CM | POA: Insufficient documentation

## 2012-11-09 DIAGNOSIS — F319 Bipolar disorder, unspecified: Secondary | ICD-10-CM | POA: Insufficient documentation

## 2012-11-09 DIAGNOSIS — Z8659 Personal history of other mental and behavioral disorders: Secondary | ICD-10-CM | POA: Insufficient documentation

## 2012-11-09 DIAGNOSIS — R1011 Right upper quadrant pain: Secondary | ICD-10-CM | POA: Insufficient documentation

## 2012-11-09 DIAGNOSIS — G8918 Other acute postprocedural pain: Secondary | ICD-10-CM | POA: Insufficient documentation

## 2012-11-09 LAB — CBC WITH DIFFERENTIAL/PLATELET
Basophils Relative: 0 % (ref 0–1)
Eosinophils Absolute: 0.4 10*3/uL (ref 0.0–0.7)
HCT: 39 % (ref 36.0–46.0)
Hemoglobin: 13 g/dL (ref 12.0–15.0)
Lymphs Abs: 2.4 10*3/uL (ref 0.7–4.0)
MCH: 29.1 pg (ref 26.0–34.0)
MCHC: 33.3 g/dL (ref 30.0–36.0)
MCV: 87.2 fL (ref 78.0–100.0)
Monocytes Absolute: 0.6 10*3/uL (ref 0.1–1.0)
Monocytes Relative: 7 % (ref 3–12)
RBC: 4.47 MIL/uL (ref 3.87–5.11)

## 2012-11-09 LAB — URINALYSIS, ROUTINE W REFLEX MICROSCOPIC
Glucose, UA: NEGATIVE mg/dL
Ketones, ur: NEGATIVE mg/dL
Nitrite: NEGATIVE
Protein, ur: NEGATIVE mg/dL
pH: 5.5 (ref 5.0–8.0)

## 2012-11-09 LAB — COMPREHENSIVE METABOLIC PANEL
Albumin: 3.8 g/dL (ref 3.5–5.2)
Alkaline Phosphatase: 82 U/L (ref 39–117)
BUN: 15 mg/dL (ref 6–23)
Creatinine, Ser: 0.93 mg/dL (ref 0.50–1.10)
GFR calc Af Amer: >90 mL/min (ref >90)
Glucose, Bld: 90 mg/dL (ref 70–99)
Total Bilirubin: 0.1 mg/dL — ABNORMAL LOW (ref 0.3–1.2)
Total Protein: 7.3 g/dL (ref 6.0–8.3)

## 2012-11-09 LAB — URINE MICROSCOPIC-ADD ON

## 2012-11-09 LAB — LIPASE, BLOOD: Lipase: 23 U/L (ref 11–59)

## 2012-11-09 MED ORDER — IOHEXOL 300 MG/ML  SOLN
20.0000 mL | INTRAMUSCULAR | Status: AC
  Administered 2012-11-09: 20 mL via ORAL

## 2012-11-09 MED ORDER — SODIUM CHLORIDE 0.9 % IV BOLUS (SEPSIS)
250.0000 mL | Freq: Once | INTRAVENOUS | Status: AC
  Administered 2012-11-09: 23:00:00 via INTRAVENOUS

## 2012-11-09 MED ORDER — SODIUM CHLORIDE 0.9 % IV SOLN
INTRAVENOUS | Status: DC
  Administered 2012-11-10: 01:00:00 via INTRAVENOUS

## 2012-11-09 MED ORDER — ONDANSETRON HCL 4 MG/2ML IJ SOLN
4.0000 mg | Freq: Once | INTRAMUSCULAR | Status: AC
  Administered 2012-11-09: 4 mg via INTRAVENOUS
  Filled 2012-11-09: qty 2

## 2012-11-09 MED ORDER — HYDROMORPHONE HCL PF 1 MG/ML IJ SOLN
1.0000 mg | Freq: Once | INTRAMUSCULAR | Status: AC
  Administered 2012-11-09: 1 mg via INTRAVENOUS
  Filled 2012-11-09: qty 1

## 2012-11-09 NOTE — ED Notes (Addendum)
Patient reports that she had her gallbladder removed on Monday; patient reports that she is having the same abdominal pain that she was before she had the surgery.  (Patient reports normal "sorness" after surgery, but reports that this pain started today).  Currently reports epigastric pain that radiates around to her back.  Reports that she is also out of her pain medication.

## 2012-11-09 NOTE — ED Provider Notes (Addendum)
History     CSN: [id number]  Arrival date & time 11/09/12  2029   First MD Initiated Contact with Patient 11/09/12 2225      Chief Complaint  Patient presents with  . Pain  . Post-op Problem    (Consider location/radiation/quality/duration/timing/severity/associated sxs/prior treatment) The history is provided by the patient.   patient is a 26 year old female status post a laparoscopic cholecystectomy on November 17. Done by [facility]. Dr. Fleeta Emmer. Since the procedure patient has had persistent pain in the right upper quadrant area that she feels like the pain she had prior to the procedure. Denies fevers nausea vomiting appetite is good currently today now there is some back pain behind the right upper coronary. Patient states in overall pain from her surgical incisions. She describes the pain as 8/10. Feels like an ache sometimes sharp. Patient's primary care Dr. his [facility] here at [facility] she has not followed up with them either.  Past Medical History  Diagnosis Date  . Bipolar 1 disorder   . Opiate addiction   . Abnormal Pap smear   . Depression   . Polysubstance abuse   . Hypertension   . Anxiety     Past Surgical History  Procedure Date  . Vaginal deliveries   . Tubal ligation 09/08/2011    Procedure: POST PARTUM TUBAL LIGATION;  Surgeon: Scheryl Darter, MD;  Location: [facility];  Service: Gynecology;  Laterality: Bilateral;  . Cholecystectomy   . Cholecystectomy 11/04/2012    Procedure: LAPAROSCOPIC CHOLECYSTECTOMY WITH INTRAOPERATIVE CHOLANGIOGRAM;  Surgeon: Wilmon Arms. Corliss Skains, MD;  Location: [facility];  Service: General;  Laterality: N/A;    Family History  Problem Relation Age of Onset  . Drug abuse Mother   . Drug abuse Father   . Diabetes Maternal Grandmother   . Hypertension Maternal Grandmother   . Diabetes Paternal Grandmother   . Diabetes Paternal Grandfather     History  Substance Use Topics  . Smoking status: Current Every Day Smoker -- 1.0  packs/day    Types: Cigarettes  . Smokeless tobacco: Never Used  . Alcohol Use: No    OB History    Grav Para Term Preterm Abortions TAB SAB Ect Mult Living   7 6 3 3 1 1   1 6       Review of Systems  Constitutional: Negative for fever and appetite change.  HENT: Negative for neck pain.   Eyes: Negative for redness.  Respiratory: Negative for shortness of breath.   Cardiovascular: Negative for chest pain.  Gastrointestinal: Positive for abdominal pain. Negative for nausea, vomiting and diarrhea.  Genitourinary: Positive for vaginal bleeding. Negative for dysuria.  Musculoskeletal: Positive for back pain.  Skin: Positive for wound. Negative for rash.  Neurological: Negative for headaches.  Hematological: Does not bruise/bleed easily.    Allergies  Review of patient's allergies indicates no known allergies.  Home Medications   Current Outpatient Rx  Name  Route  Sig  Dispense  Refill  . ADULT MULTIVITAMIN W/MINERALS CH   Oral   Take 1 tablet by mouth daily.         Marland Kitchen NAPROXEN SODIUM 220 MG PO TABS   Oral   Take 220 mg by mouth 2 (two) times daily as needed. For pain         . OXYCODONE-ACETAMINOPHEN 5-325 MG PO TABS   Oral   Take 1-2 tablets by mouth every 6 (six) hours as needed for pain.   20 tablet  0   . PROMETHAZINE HCL 25 MG PO TABS   Oral   Take 1 tablet (25 mg total) by mouth every 6 (six) hours as needed for nausea.   20 tablet   0     BP 102/67  Pulse 70  Temp 97.7 F (36.5 C) (Oral)  Resp 16  SpO2 99%  LMP 11/08/2012  Physical Exam  Nursing note and vitals reviewed. Constitutional: She is oriented to person, place, and time. She appears well-developed and well-nourished. No distress.  HENT:  Head: Normocephalic and atraumatic.  Mouth/Throat: Oropharynx is clear and moist.  Eyes: Conjunctivae normal and EOM are normal. Pupils are equal, round, and reactive to light.  Neck: Normal range of motion. Neck supple.  Cardiovascular: Normal  rate, normal heart sounds and intact distal pulses.   No murmur heard. Pulmonary/Chest: Effort normal and breath sounds normal.  Abdominal: Soft. Bowel sounds are normal. There is tenderness.       Mild the abdominal tenderness no guarding. Tenderness is diffuse. Surgical incisions appear to be healing well. No evidence of infection.  Musculoskeletal: Normal range of motion. She exhibits no edema.  Neurological: She is alert and oriented to person, place, and time. No cranial nerve deficit. She exhibits normal muscle tone. Coordination normal.  Skin: Skin is warm. No rash noted.    ED Course  Procedures (including critical care time)  Labs Reviewed  COMPREHENSIVE METABOLIC PANEL - Abnormal; Notable for the following:    ALT 71 (*)     Total Bilirubin 0.1 (*)     GFR calc non Af Amer 84 (*)     All other components within normal limits  URINALYSIS, ROUTINE W REFLEX MICROSCOPIC - Abnormal; Notable for the following:    APPearance CLOUDY (*)     Specific Gravity, Urine 1.035 (*)     Hgb urine dipstick LARGE (*)     Leukocytes, UA TRACE (*)     All other components within normal limits  URINE MICROSCOPIC-ADD ON - Abnormal; Notable for the following:    Squamous Epithelial / LPF FEW (*)     Bacteria, UA MANY (*)     All other components within normal limits  CBC WITH DIFFERENTIAL  LIPASE, BLOOD  POCT PREGNANCY, URINE   Dg Chest 2 View  11/09/2012  *RADIOLOGY REPORT*  Clinical Data: Postoperative pain in the abdomen.  Gallbladder removed on Monday.  CHEST - 2 VIEW  Comparison: 03/24/2007  Findings: The heart size and pulmonary vascularity are normal. The lungs appear clear and expanded without focal air space disease or consolidation. No blunting of the costophrenic angles.  No pneumothorax.  Mediastinal contours appear intact.  Surgical clips in the right upper quadrant.  Calcification in the left upper quadrant which may represent renal stones.  No significant changes since the  previous study.  IMPRESSION: No evidence of active pulmonary disease.   Original Report Authenticated By: Burman Nieves, M.D.    Results for orders placed during the hospital encounter of 11/09/12  CBC WITH DIFFERENTIAL      Component Value Range   WBC 8.2  4.0 - 10.5 K/uL   RBC 4.47  3.87 - 5.11 MIL/uL   Hemoglobin 13.0  12.0 - 15.0 g/dL   HCT 16.1  09.6 - 04.5 %   MCV 87.2  78.0 - 100.0 fL   MCH 29.1  26.0 - 34.0 pg   MCHC 33.3  30.0 - 36.0 g/dL   RDW 40.9  81.1 - 91.4 %  Platelets 349  150 - 400 K/uL   Neutrophils Relative 58  43 - 77 %   Neutro Abs 4.8  1.7 - 7.7 K/uL   Lymphocytes Relative 30  12 - 46 %   Lymphs Abs 2.4  0.7 - 4.0 K/uL   Monocytes Relative 7  3 - 12 %   Monocytes Absolute 0.6  0.1 - 1.0 K/uL   Eosinophils Relative 5  0 - 5 %   Eosinophils Absolute 0.4  0.0 - 0.7 K/uL   Basophils Relative 0  0 - 1 %   Basophils Absolute 0.0  0.0 - 0.1 K/uL  COMPREHENSIVE METABOLIC PANEL      Component Value Range   Sodium 138  135 - 145 mEq/L   Potassium 3.6  3.5 - 5.1 mEq/L   Chloride 101  96 - 112 mEq/L   CO2 28  19 - 32 mEq/L   Glucose, Bld 90  70 - 99 mg/dL   BUN 15  6 - 23 mg/dL   Creatinine, Ser 0.98  0.50 - 1.10 mg/dL   Calcium 11.9  8.4 - 14.7 mg/dL   Total Protein 7.3  6.0 - 8.3 g/dL   Albumin 3.8  3.5 - 5.2 g/dL   AST 21  0 - 37 U/L   ALT 71 (*) 0 - 35 U/L   Alkaline Phosphatase 82  39 - 117 U/L   Total Bilirubin 0.1 (*) 0.3 - 1.2 mg/dL   GFR calc non Af Amer 84 (*) >90 mL/min   GFR calc Af Amer >90  >90 mL/min  URINALYSIS, ROUTINE W REFLEX MICROSCOPIC      Component Value Range   Color, Urine YELLOW  YELLOW   APPearance CLOUDY (*) CLEAR   Specific Gravity, Urine 1.035 (*) 1.005 - 1.030   pH 5.5  5.0 - 8.0   Glucose, UA NEGATIVE  NEGATIVE mg/dL   Hgb urine dipstick LARGE (*) NEGATIVE   Bilirubin Urine NEGATIVE  NEGATIVE   Ketones, ur NEGATIVE  NEGATIVE mg/dL   Protein, ur NEGATIVE  NEGATIVE mg/dL   Urobilinogen, UA 1.0  0.0 - 1.0 mg/dL   Nitrite  NEGATIVE  NEGATIVE   Leukocytes, UA TRACE (*) NEGATIVE  LIPASE, BLOOD      Component Value Range   Lipase 23  11 - 59 U/L  POCT PREGNANCY, URINE      Component Value Range   Preg Test, Ur NEGATIVE  NEGATIVE  URINE MICROSCOPIC-ADD ON      Component Value Range   Squamous Epithelial / LPF FEW (*) RARE   WBC, UA 0-2  <3 WBC/hpf   RBC / HPF TOO NUMEROUS TO COUNT  <3 RBC/hpf   Bacteria, UA MANY (*) RARE   Urine-Other MUCOUS PRESENT       1. Abdominal pain       MDM  Patient is status post laparoscopic cholecystectomy on November 17. Has not been back to see surgery since the procedure. She's had persistent pain that is similar to the pain she had prior to surgery which is a little unusual. Patient denies any fever no significant abdominal tenderness. We'll go M.D. CT tonight repeat the labs which did not show any significant liver function test abnormalities. There is no significant leukocytosis. Urinalysis does have a red blood cells but she is on her menstrual period currently. If CT is negative patient can be discharged home with followup with family medicine who is her primary care Dr. and packed with [facility].  Patient's past medical history for substance abuse has been noted.  This will be a shared visit with the mid-level in the CDU.  Medical screening examination/treatment/procedure(s) were conducted as a shared visit with non-physician practitioner(s) and myself.  I personally evaluated the patient during the encounter        Shelda Jakes, MD 11/09/12 2311   Addendum:  Results for orders placed during the hospital encounter of 11/09/12  CBC WITH DIFFERENTIAL      Component Value Range   WBC 8.2  4.0 - 10.5 K/uL   RBC 4.47  3.87 - 5.11 MIL/uL   Hemoglobin 13.0  12.0 - 15.0 g/dL   HCT 40.9  81.1 - 91.4 %   MCV 87.2  78.0 - 100.0 fL   MCH 29.1  26.0 - 34.0 pg   MCHC 33.3  30.0 - 36.0 g/dL   RDW 78.2  95.6 - 21.3 %   Platelets 349  150 - 400  K/uL   Neutrophils Relative 58  43 - 77 %   Neutro Abs 4.8  1.7 - 7.7 K/uL   Lymphocytes Relative 30  12 - 46 %   Lymphs Abs 2.4  0.7 - 4.0 K/uL   Monocytes Relative 7  3 - 12 %   Monocytes Absolute 0.6  0.1 - 1.0 K/uL   Eosinophils Relative 5  0 - 5 %   Eosinophils Absolute 0.4  0.0 - 0.7 K/uL   Basophils Relative 0  0 - 1 %   Basophils Absolute 0.0  0.0 - 0.1 K/uL  COMPREHENSIVE METABOLIC PANEL      Component Value Range   Sodium 138  135 - 145 mEq/L   Potassium 3.6  3.5 - 5.1 mEq/L   Chloride 101  96 - 112 mEq/L   CO2 28  19 - 32 mEq/L   Glucose, Bld 90  70 - 99 mg/dL   BUN 15  6 - 23 mg/dL   Creatinine, Ser 0.86  0.50 - 1.10 mg/dL   Calcium 57.8  8.4 - 46.9 mg/dL   Total Protein 7.3  6.0 - 8.3 g/dL   Albumin 3.8  3.5 - 5.2 g/dL   AST 21  0 - 37 U/L   ALT 71 (*) 0 - 35 U/L   Alkaline Phosphatase 82  39 - 117 U/L   Total Bilirubin 0.1 (*) 0.3 - 1.2 mg/dL   GFR calc non Af Amer 84 (*) >90 mL/min   GFR calc Af Amer >90  >90 mL/min  URINALYSIS, ROUTINE W REFLEX MICROSCOPIC      Component Value Range   Color, Urine YELLOW  YELLOW   APPearance CLOUDY (*) CLEAR   Specific Gravity, Urine 1.035 (*) 1.005 - 1.030   pH 5.5  5.0 - 8.0   Glucose, UA NEGATIVE  NEGATIVE mg/dL   Hgb urine dipstick LARGE (*) NEGATIVE   Bilirubin Urine NEGATIVE  NEGATIVE   Ketones, ur NEGATIVE  NEGATIVE mg/dL   Protein, ur NEGATIVE  NEGATIVE mg/dL   Urobilinogen, UA 1.0  0.0 - 1.0 mg/dL   Nitrite NEGATIVE  NEGATIVE   Leukocytes, UA TRACE (*) NEGATIVE  LIPASE, BLOOD      Component Value Range   Lipase 23  11 - 59 U/L  POCT PREGNANCY, URINE      Component Value Range   Preg Test, Ur NEGATIVE  NEGATIVE  URINE MICROSCOPIC-ADD ON      Component Value Range   Squamous Epithelial / LPF FEW (*) RARE  WBC, UA 0-2  <3 WBC/hpf   RBC / HPF TOO NUMEROUS TO COUNT  <3 RBC/hpf   Bacteria, UA MANY (*) RARE   Urine-Other MUCOUS PRESENT     Dg Chest 2 View  11/09/2012  *RADIOLOGY REPORT*  Clinical Data:  Postoperative pain in the abdomen.  Gallbladder removed on Monday.  CHEST - 2 VIEW  Comparison: 03/24/2007  Findings: The heart size and pulmonary vascularity are normal. The lungs appear clear and expanded without focal air space disease or consolidation. No blunting of the costophrenic angles.  No pneumothorax.  Mediastinal contours appear intact.  Surgical clips in the right upper quadrant.  Calcification in the left upper quadrant which may represent renal stones.  No significant changes since the previous study.  IMPRESSION: No evidence of active pulmonary disease.   Original Report Authenticated By: Burman Nieves, M.D.    Dg Cholangiogram Operative  11/04/2012  *RADIOLOGY REPORT*  Clinical Data:   Gallstones.  Acute cholecystitis.  INTRAOPERATIVE CHOLANGIOGRAM  Technique:  Cholangiographic images from the C-arm fluoroscopic device were submitted for interpretation post-operatively.  Please see the procedural report for the amount of contrast and the fluoroscopy time utilized.  Comparison:  11/03/2012  Findings:  The gallbladder has been removed and the cystic duct cannulated.  There is good opacification of the biliary tree.  No retained stones are seen. There is a transited filling defect in the proximal common bile duct which is mobile likely an air bubble or mucosal fold.  There is prompt passage of contrast into the duodenum.  IMPRESSION: Negative operative cholangiogram.   Original Report Authenticated By: Davonna Belling, M.D.    US Abdomen Complete  11/04/2012  *RADIOLOGY REPORT*  Clinical Data:  Abdominal pain  COMPLETE ABDOMINAL ULTRASOUND  Comparison:  None.  Findings:  Gallbladder:  Seven gallstones limits visualization of the gallbladder.  There is questionable mild wall thickening at 3 mm. Per the sonographer, positive Murphy's sign.  Common bile duct:  Measures up to 3 mm within the mid duct.  The distal duct is partially obscured.  Liver:  No focal lesion identified.  Within normal limits  in parenchymal echogenicity.  IVC:  Appears normal.  Pancreas:  No focal abnormality seen.  Spleen:  Measures 9 cm.  No focal abnormality.  Right Kidney:  Measures 12 cm.  No hydronephrosis or focal abnormality.  Left Kidney:  Measures 11 cm.  No hydronephrosis or focal abnormality.  Abdominal aorta:  No aneurysm identified.  IMPRESSION: Gallstones and upper normal to mildly increased gallbladder wall thickness. Per the sonographer, positive Murphy's sign. Acute cholecystitis not excluded.  Correlate with clinical examination and HIDA scan if warranted.   Original Report Authenticated By: Jearld Lesch, M.D.    Ct Abdomen Pelvis W Contrast  11/10/2012  *RADIOLOGY REPORT*  Clinical Data: Gallbladder removed on Monday.  Now complains of epigastric pain radiating to the back.  CT ABDOMEN AND PELVIS WITH CONTRAST  Technique:  Multidetector CT imaging of the abdomen and pelvis was performed following the standard protocol during bolus administration of intravenous contrast.  Contrast: OMNIPAQUE IOHEXOL 300 MG/ML  SOLN  Comparison: None.  Findings: Minimal patchy focal areas of infiltration or atelectasis in the left lung base.  Surgical absence of the gallbladder.  Mild intra and extrahepatic bile duct dilatation is likely postoperative. 2.5 cm loculated fluid collection in the gallbladder fossa may represent postoperative fluid collection.  The liver, spleen, pancreas, adrenal glands, abdominal aorta, and retroperitoneal lymph nodes are unremarkable.  7  mm stone in the midportion of the left kidney without evidence of obstruction.  Right kidney is unremarkable. The stomach, small bowel, and colon are not abnormally distended. Diffusely stool filled colon.  No free fluid or free air in the abdomen.  Infiltration in the anterior abdominal wall is likely postoperative. The  Pelvis:  The uterus and adnexal structures are not enlarged.  The bladder is decompressed.  No free or loculated pelvic fluid collections.   No abnormal adnexal masses.  No diverticulitis.  The appendix is not identified.  Normal alignment of the lumbar vertebrae.  IMPRESSION: Surgical absence of the gallbladder with mild postoperative dilatation of the bile ducts.  Small fluid collection in the gallbladder fossa is probably a postoperative fluid collection. Nonobstructing left renal stone.  Patchy infiltration or atelectasis in the left lung base.   Original Report Authenticated By: Burman Nieves, M.D.    Nonspecific labs and CT of abdomen, does not explain persistent pain.    Shelda Jakes, MD 11/10/12 [phone]

## 2012-11-10 ENCOUNTER — Emergency Department (HOSPITAL_COMMUNITY): Payer: Medicaid Other

## 2012-11-10 MED ORDER — HYDROMORPHONE HCL PF 1 MG/ML IJ SOLN
1.0000 mg | Freq: Once | INTRAMUSCULAR | Status: AC
  Administered 2012-11-10: 1 mg via INTRAVENOUS
  Filled 2012-11-10: qty 1

## 2012-11-10 MED ORDER — IOHEXOL 300 MG/ML  SOLN
100.0000 mL | Freq: Once | INTRAMUSCULAR | Status: AC | PRN
  Administered 2012-11-10: 100 mL via INTRAVENOUS

## 2012-11-10 NOTE — ED Notes (Signed)
Patient transported to CT 

## 2012-11-18 NOTE — Discharge Summary (Signed)
Physician Discharge Summary  Patient ID: VESPER TRANT MRN: [id number] DOB/AGE: February 23, 1986 26 y.o.  Admit date: 11/03/2012 Discharge date: 11/04/12  Admission Diagnoses:  Acute cholecystitis  Discharge Diagnoses: Acute cholecystitis Active Problems:  * No active hospital problems. *    Discharged Condition: good  Hospital Course: Admitted the evening of 11/17.  Underwent lap chole on 11/18.  During the entire hospitalization, the patient was very rude and verbally abusive to staff.  She left the floor and signed out AMA the evening after surgery.  She refused to sign AMA paperwork and was not given any pain medicine prescriptions.  Consults: None  Significant Diagnostic Studies: noen  Treatments: surgery: lap chole with IOC  Discharge Exam: Blood pressure 106/70, pulse 58, temperature 98.5 F (36.9 C), temperature source Oral, resp. rate 12, height 5\' 7"  (1.702 m), weight 150 lb 12.8 oz (68.402 kg), last menstrual period 10/21/2012, SpO2 100.00%, not currently breastfeeding. Patient left AMA on the night after surgery.  No discharge exam done.  Patient was stable prior to leaving.  Disposition: Patient left against medical advice.  Discharge Orders    Future Appointments: Provider: Department: Dept Phone: Center:   11/22/2012 11:10 AM Wilmon Arms. Corliss Skains, MD [facility], [place] [phone] None       Medication List     As of 11/18/2012  3:55 PM    ASK your doctor about these medications         multivitamin with minerals Tabs   Take 1 tablet by mouth daily.      naproxen sodium 220 MG tablet   Commonly known as: ANAPROX   Take 220 mg by mouth 2 (two) times daily as needed. For pain           Follow-up Information    Follow up with [facility].   Contact information:   [address]   [place] [address] [phone]          Signed: Wynona Luna. 11/18/2012, 3:55 PM

## 2012-11-22 ENCOUNTER — Ambulatory Visit (INDEPENDENT_AMBULATORY_CARE_PROVIDER_SITE_OTHER): Payer: Self-pay | Admitting: Surgery

## 2012-11-22 ENCOUNTER — Encounter (INDEPENDENT_AMBULATORY_CARE_PROVIDER_SITE_OTHER): Payer: Self-pay | Admitting: Surgery

## 2012-11-22 VITALS — BP 128/68 | HR 68 | Temp 97.6°F | Resp 18 | Ht 67.0 in | Wt 150.8 lb

## 2012-11-22 DIAGNOSIS — K8010 Calculus of gallbladder with chronic cholecystitis without obstruction: Secondary | ICD-10-CM

## 2012-11-22 DIAGNOSIS — R1013 Epigastric pain: Secondary | ICD-10-CM

## 2012-11-22 MED ORDER — OXYCODONE-ACETAMINOPHEN 5-325 MG PO TABS: 1.0000 | ORAL_TABLET | ORAL | Status: DC | PRN

## 2012-11-22 MED ORDER — SUCRALFATE 1 G PO TABS: 1.0000 g | ORAL_TABLET | Freq: Four times a day (QID) | ORAL | Status: DC

## 2012-11-22 MED ORDER — FAMOTIDINE 40 MG PO TABS: 40.0000 mg | ORAL_TABLET | Freq: Every day | ORAL | Status: DC

## 2012-11-22 NOTE — Progress Notes (Signed)
Status post laparoscopic cholecystectomy with intraoperative cholangiogram on 11/04/12 for chronic calculus cholecystitis. The patient continues to have significant epigastric pain. She denies any right upper quadrant pain. The pain is located in the midline and straight through to her back. It is a constant pain it seems to be exacerbated by eating. She has been evaluated emerged department and based on her blood work and other studies there does not seem to be any complications from her surgery. She has tried taking some TUMS which seems to help slightly. Her incisions are all well-healed.  Filed Vitals:   11/22/12 1131  BP: 128/68  Pulse: 68  Temp: 97.6 F (36.4 C)  Resp: 18    Well-developed nourished female in no apparent distress Abdomen with well-healed incisions. No sign of right upper quadrant tenderness. Her pain is localized in her epigastrium. She is nondistended.  Lab Results  Component Value Date   WBC 8.2 11/09/2012   HGB 13.0 11/09/2012   HCT 39.0 11/09/2012   MCV 87.2 11/09/2012   PLT 349 11/09/2012   Lab Results  Component Value Date   ALT 71* 11/09/2012   AST 21 11/09/2012   ALKPHOS 82 11/09/2012   BILITOT 0.1* 11/09/2012   Lab Results  Component Value Date   LIPASE 23 11/09/2012   *RADIOLOGY REPORT*  Clinical Data: Gallbladder removed on Monday. Now complains of  epigastric pain radiating to the back.  CT ABDOMEN AND PELVIS WITH CONTRAST  Technique: Multidetector CT imaging of the abdomen and pelvis was  performed following the standard protocol during bolus  administration of intravenous contrast.  Contrast: OMNIPAQUE IOHEXOL 300 MG/ML SOLN  Comparison: None.  Findings: Minimal patchy focal areas of infiltration or atelectasis  in the left lung base.  Surgical absence of the gallbladder. Mild intra and extrahepatic  bile duct dilatation is likely postoperative. 2.5 cm loculated  fluid collection in the gallbladder fossa may represent   postoperative fluid collection. The liver, spleen, pancreas,  adrenal glands, abdominal aorta, and retroperitoneal lymph nodes  are unremarkable. 7 mm stone in the midportion of the left kidney  without evidence of obstruction. Right kidney is unremarkable.  The stomach, small bowel, and colon are not abnormally distended.  Diffusely stool filled colon. No free fluid or free air in the  abdomen. Infiltration in the anterior abdominal wall is likely  postoperative. The  Pelvis: The uterus and adnexal structures are not enlarged. The  bladder is decompressed. No free or loculated pelvic fluid  collections. No abnormal adnexal masses. No diverticulitis. The  appendix is not identified. Normal alignment of the lumbar  vertebrae.  IMPRESSION:  Surgical absence of the gallbladder with mild postoperative  dilatation of the bile ducts. Small fluid collection in the  gallbladder fossa is probably a postoperative fluid collection.  Nonobstructing left renal stone. Patchy infiltration or  atelectasis in the left lung base.  Original Report Authenticated By: Burman Nieves, M.D.  Imp:  S/p lap chole with IOC; no apparent post-op complications   Epigastric pain - possibly due to gastritis or peptic ulcer disease  Plan:  Empirically treat with Carafate, Pepcid.  Bland diet.  PRN Percocet.  Follow-up 2 weeks.  If no improvement, consider GI consult for EGD.  Wilmon Arms. Corliss Skains, MD, [facility]  11/22/2012 12:12 PM

## 2012-12-02 ENCOUNTER — Telehealth (INDEPENDENT_AMBULATORY_CARE_PROVIDER_SITE_OTHER): Payer: Self-pay | Admitting: General Surgery

## 2012-12-02 ENCOUNTER — Telehealth (INDEPENDENT_AMBULATORY_CARE_PROVIDER_SITE_OTHER): Payer: Self-pay

## 2012-12-02 NOTE — Telephone Encounter (Signed)
Pt calling for additional pain medication to hold her over until her appt with Dr. Corliss Skains on 12/05/12.  She states that the Pepcid does not really help.  The Percocet temporarily relieves the pain, but it comes right back.  She stated, "I think I have an ulcer."  Dr. Corliss Skains notified.

## 2012-12-02 NOTE — Telephone Encounter (Signed)
Pt called to check on pain refill request status, called in earlier today. She also said her pain has not improved after taking the Carafate and Pepcid, she is scheduled to see Dr.Tsuei on Thursday/ [phone]/ gy

## 2012-12-02 NOTE — Telephone Encounter (Signed)
Called patient and told her that I spoke to Dr Corliss Skains and he stated that I could call in the Vicodin. I called the Rx into [facility] on [address] And we talked about the up coming apt that she has with Dr Corliss Skains on 12-05-12 and we may need schedule with a GI Dr.

## 2012-12-05 ENCOUNTER — Encounter (INDEPENDENT_AMBULATORY_CARE_PROVIDER_SITE_OTHER): Payer: Self-pay | Admitting: Surgery

## 2012-12-05 ENCOUNTER — Encounter: Payer: Self-pay | Admitting: Internal Medicine

## 2012-12-05 ENCOUNTER — Ambulatory Visit (INDEPENDENT_AMBULATORY_CARE_PROVIDER_SITE_OTHER): Payer: Medicaid Other | Admitting: Surgery

## 2012-12-05 VITALS — BP 118/79 | HR 72 | Temp 98.8°F | Resp 14 | Ht 67.0 in | Wt 150.6 lb

## 2012-12-05 DIAGNOSIS — R1013 Epigastric pain: Secondary | ICD-10-CM

## 2012-12-05 NOTE — Progress Notes (Signed)
The patient continues to have some epigastric pain. Her symptoms are slightly improved with Pepcid and Carafate. The pain seems to be exacerbated when she eats certain types of food. At this point I think she would benefit from a GI referral for possible EGD to rule out peptic ulcer disease. I encouraged her to continue with a bland diet and to continue the medication. We will see her back on a when necessary basis.       Wilmon Arms. Corliss Skains, MD, [facility]  12/05/2012 10:31 AM

## 2012-12-06 ENCOUNTER — Ambulatory Visit (INDEPENDENT_AMBULATORY_CARE_PROVIDER_SITE_OTHER): Payer: Medicaid Other | Admitting: Internal Medicine

## 2012-12-06 ENCOUNTER — Encounter: Payer: Self-pay | Admitting: Internal Medicine

## 2012-12-06 VITALS — BP 118/64 | HR 70 | Ht 67.0 in | Wt 150.0 lb

## 2012-12-06 DIAGNOSIS — R1013 Epigastric pain: Secondary | ICD-10-CM

## 2012-12-06 DIAGNOSIS — K5900 Constipation, unspecified: Secondary | ICD-10-CM

## 2012-12-06 DIAGNOSIS — K5909 Other constipation: Secondary | ICD-10-CM

## 2012-12-06 DIAGNOSIS — K625 Hemorrhage of anus and rectum: Secondary | ICD-10-CM

## 2012-12-06 MED ORDER — OMEPRAZOLE 40 MG PO CPDR: 40.0000 mg | DELAYED_RELEASE_CAPSULE | Freq: Every day | ORAL | Status: DC

## 2012-12-06 MED ORDER — LUBIPROSTONE 8 MCG PO CAPS: 8.0000 ug | ORAL_CAPSULE | Freq: Two times a day (BID) | ORAL | Status: DC

## 2012-12-06 NOTE — Patient Instructions (Addendum)
You have been scheduled for an endoscopy/Flexible sigmoidoscopy with propofol. Please follow written instructions given to you at your visit today. If you use inhalers (even only as needed) or a CPAP machine, please bring them with you on the day of your procedure.  We have sent the following medications to your pharmacy for you to pick up at your convenience: Prilosec, Amitiza

## 2012-12-06 NOTE — Progress Notes (Signed)
Patient ID: Rhonda Cabrera, female   DOB: 1986-03-10, 26 y.o.   MRN: [id number]  SUBJECTIVE: HPI Rhonda Cabrera is a 26 year old female with a past medical history of anxiety, depression, bipolar disorder, hypertension and substance abuse who is seen in consultation at the request of Dr. Corliss Skains for valuation of epigastric abdominal pain. The patient states that she's had epigastric abdominal pain which is worsened with eating for the last several months. She had a laparoscopic cholecystectomy for gallstones in November. She recovered well from the surgery but it did not help her epigastric abdominal pain. She states the pain at times radiates to her back. Occasional nausea without vomiting. She has tried famotidine and sucralfate which have helped only minimally. She also reports ongoing constipation which is been worse since the birth of her second child in September 2012. She reports that she free only has to strain at stool in her stools have been hard and small. She is having a bowel movement every 2-3 days. This is somewhat long-standing. She does occasionally see bright red blood per rectum along with rectal pain. She feels this might be secondary to hemorrhoids, but she is unsure. No fevers or chills. She does smoke cigarettes, has a history of alcohol use but reports she drinks alcohol only rarely now.  Review of Systems  As per history of present illness, otherwise negative   Past Medical History  Diagnosis Date  . Bipolar 1 disorder   . Opiate addiction   . Abnormal Pap smear   . Depression   . Polysubstance abuse   . Hypertension   . Anxiety     Current Outpatient Prescriptions  Medication Sig Dispense Refill  . famotidine (PEPCID) 40 MG tablet Take 1 tablet (40 mg total) by mouth daily.  30 tablet  0  . Multiple Vitamin (MULTIVITAMIN WITH MINERALS) TABS Take 1 tablet by mouth daily.      . sucralfate (CARAFATE) 1 G tablet Take 1 tablet (1 g total) by mouth 4 (four) times daily.  30  tablet  0  . oxyCODONE-acetaminophen (PERCOCET/ROXICET) 5-325 MG per tablet Take 1 tablet by mouth every 4 (four) hours as needed for pain.  40 tablet  0  . promethazine (PHENERGAN) 25 MG tablet Take 1 tablet (25 mg total) by mouth every 6 (six) hours as needed for nausea.  20 tablet  0    No Known Allergies  Family History  Problem Relation Age of Onset  . Drug abuse Mother   . Drug abuse Father   . Diabetes Maternal Grandmother   . Hypertension Maternal Grandmother   . Diabetes Paternal Grandmother   . Diabetes Paternal Grandfather     History  Substance Use Topics  . Smoking status: Current Every Day Smoker -- 1.0 packs/day    Types: Cigarettes  . Smokeless tobacco: Never Used  . Alcohol Use: No    OBJECTIVE: BP 118/64  Pulse 70  Ht 5\' 7"  (1.702 m)  Wt 150 lb (68.04 kg)  BMI 23.49 kg/m2  SpO2 98%  LMP 11/08/2012  Breastfeeding? No Constitutional: Well-developed and well-nourished. No distress. HEENT: Normocephalic and atraumatic. Oropharynx is clear and moist. No oropharyngeal exudate. Conjunctivae are normal. No scleral icterus. Neck: Neck supple. Trachea midline. Cardiovascular: Normal rate, regular rhythm and intact distal pulses. No M/R/G Pulmonary/chest: Effort normal and breath sounds normal. No wheezing, rales or rhonchi. Abdominal: Soft, nontender, nondistended. Well-healed laparoscopic scars Bowel sounds active throughout. There are no masses palpable. No hepatosplenomegaly. Extremities: no clubbing, cyanosis,  or edema Lymphadenopathy: No cervical adenopathy noted. Neurological: Alert and oriented to person place and time. Skin: Skin is warm and dry. No rashes noted. Psychiatric: Normal mood and affect. Behavior is normal.  Labs and Imaging -- CBC    Component Value Date/Time   WBC 8.2 11/09/2012 2109   RBC 4.47 11/09/2012 2109   HGB 13.0 11/09/2012 2109   HCT 39.0 11/09/2012 2109   PLT 349 11/09/2012 2109   MCV 87.2 11/09/2012 2109   MCH 29.1  11/09/2012 2109   MCHC 33.3 11/09/2012 2109   RDW 13.0 11/09/2012 2109   LYMPHSABS 2.4 11/09/2012 2109   MONOABS 0.6 11/09/2012 2109   EOSABS 0.4 11/09/2012 2109   BASOSABS 0.0 11/09/2012 2109    CMP     Component Value Date/Time   NA 138 11/09/2012 2109   K 3.6 11/09/2012 2109   CL 101 11/09/2012 2109   CO2 28 11/09/2012 2109   GLUCOSE 90 11/09/2012 2109   BUN 15 11/09/2012 2109   CREATININE 0.93 11/09/2012 2109   CREATININE 0.47 05/26/2009 0849   CALCIUM 10.2 11/09/2012 2109   PROT 7.3 11/09/2012 2109   ALBUMIN 3.8 11/09/2012 2109   AST 21 11/09/2012 2109   ALT 71* 11/09/2012 2109   ALKPHOS 82 11/09/2012 2109   BILITOT 0.1* 11/09/2012 2109   GFRNONAA 84* 11/09/2012 2109   GFRAA >90 11/09/2012 2109   Clinical Data: Gallbladder removed on Monday.  Now complains of epigastric pain radiating to the back.   CT ABDOMEN AND PELVIS WITH CONTRAST -- 11/10/12   Technique:  Multidetector CT imaging of the abdomen and pelvis was performed following the standard protocol during bolus administration of intravenous contrast.   Contrast: OMNIPAQUE IOHEXOL 300 MG/ML  SOLN   Comparison: None.   Findings: Minimal patchy focal areas of infiltration or atelectasis in the left lung base.   Surgical absence of the gallbladder.  Mild intra and extrahepatic bile duct dilatation is likely postoperative. 2.5 cm loculated fluid collection in the gallbladder fossa may represent postoperative fluid collection.  The liver, spleen, pancreas, adrenal glands, abdominal aorta, and retroperitoneal lymph nodes are unremarkable.  7 mm stone in the midportion of the left kidney without evidence of obstruction.  Right kidney is unremarkable. The stomach, small bowel, and colon are not abnormally distended. Diffusely stool filled colon.  No free fluid or free air in the abdomen.  Infiltration in the anterior abdominal wall is likely postoperative. The   Pelvis:  The uterus and adnexal  structures are not enlarged.  The bladder is decompressed.  No free or loculated pelvic fluid collections.  No abnormal adnexal masses.  No diverticulitis.  The appendix is not identified.  Normal alignment of the lumbar vertebrae.   IMPRESSION: Surgical absence of the gallbladder with mild postoperative dilatation of the bile ducts.  Small fluid collection in the gallbladder fossa is probably a postoperative fluid collection. Nonobstructing left renal stone.  Patchy infiltration or atelectasis in the left lung base.   ASSESSMENT AND PLAN: 26 year old female with a past medical history of anxiety, depression, bipolar disorder, hypertension and substance abuse who is seen in consultation at the request of Dr. Corliss Skains for valuation of epigastric abdominal pain, also with constipation and rectal bleeding  1.  Epigastric pain -- her pain is dyspeptic in nature and I recommended upper endoscopy for direct visualization rule out ulcer disease and H. Pylori.  We discussed EGD including the risks and benefits and she is agreeable to proceed. I would  like to change her H2 blocker to PPI. I've described omeprazole 40 mg daily and instructed her to take this 30 minutes to one hour before breakfast. She can continue sucralfate she finds it helpful. She is asked to avoid NSAIDs. Further recommendations after endoscopy  2.  Constipation and rectal bleeding -- her rectal bleeding is likely secondary to anorectal pathology given her known constipation with frequent straining, however I recommended direct visualization the flex will sigmoidoscopy to rule out distal colonic pathology and better evaluate her bleeding.  She has tried over-the-counter docusate, Dulcolax without benefit. I recommended lubiprostone 8 mcg twice daily.  She is made aware that the main side effect is diarrhea, and asked to call us if this occurs.  She did ask for pain medication, but narcotic pain medication was not provided due to her  history of substance abuse. Also think becoming more regular with the use of lubiprostone will also help with some of her lower abdominal pain, in addition of PPI may improve dyspeptic symptoms,

## 2013-01-02 ENCOUNTER — Encounter: Payer: Self-pay | Admitting: Internal Medicine

## 2013-01-02 ENCOUNTER — Ambulatory Visit (AMBULATORY_SURGERY_CENTER): Payer: Medicaid Other | Admitting: Internal Medicine

## 2013-01-02 VITALS — BP 127/78 | HR 75 | Temp 98.8°F | Resp 21 | Ht 64.0 in | Wt 150.0 lb

## 2013-01-02 DIAGNOSIS — D126 Benign neoplasm of colon, unspecified: Secondary | ICD-10-CM

## 2013-01-02 DIAGNOSIS — K635 Polyp of colon: Secondary | ICD-10-CM

## 2013-01-02 DIAGNOSIS — R1013 Epigastric pain: Secondary | ICD-10-CM

## 2013-01-02 DIAGNOSIS — K2970 Gastritis, unspecified, without bleeding: Secondary | ICD-10-CM

## 2013-01-02 DIAGNOSIS — K625 Hemorrhage of anus and rectum: Secondary | ICD-10-CM

## 2013-01-02 DIAGNOSIS — K5900 Constipation, unspecified: Secondary | ICD-10-CM

## 2013-01-02 DIAGNOSIS — D131 Benign neoplasm of stomach: Secondary | ICD-10-CM

## 2013-01-02 MED ORDER — LUBIPROSTONE 24 MCG PO CAPS: 24.0000 ug | ORAL_CAPSULE | Freq: Two times a day (BID) | ORAL | Status: DC

## 2013-01-02 MED ORDER — SODIUM CHLORIDE 0.9 % IV SOLN: 500.0000 mL | INTRAVENOUS | Status: DC

## 2013-01-02 NOTE — Op Note (Signed)
[facility] [address] [place], [address]   FLEXIBLE SIGMOIDOSCOPY PROCEDURE REPORT  PATIENT: Rhonda Cabrera, Rhonda Cabrera  MR#: [id number] BIRTHDATE: 03-Feb-1986 , 26  yrs. old GENDER: Female ENDOSCOPIST: Beverley Fiedler, MD REFERRED BY: Manus Rudd, M.D. PROCEDURE DATE:  01/02/2013 PROCEDURE:   Sigmoidoscopy with biopsy ASA CLASS:   Class II INDICATIONS:rectal bleeding. MEDICATIONS: MAC sedation, administered by CRNA and propofol (Diprivan) 150mg  IV  DESCRIPTION OF PROCEDURE:   After the risks benefits and alternatives of the procedure were thoroughly explained, informed consent was obtained.  revealed no abnormalities of the rectum. The endoscope was introduced through the anus  and advanced to the sigmoid colon , limited by No adverse events experienced.   The quality of the prep was fair .  The instrument was then slowly withdrawn as the mucosa was fully examined.      COLON FINDINGS: Four diminutive sessile polyps, measuring 2-3 mm in size, were found in the rectosigmoid colon.  Polypectomy was performed with cold forceps.  All resections were complete and all polyp tissue was completely retrieved.  The colonic mucosa appeared normal in the examined portions of the sigmoid colon and rectum. Small internal hemorrhoids were found. Retroflexed views revealed internal hemorrhoid.    The scope was then withdrawn from the patient and the procedure terminated.  COMPLICATIONS: There were no complications.  ENDOSCOPIC IMPRESSION: 1.   Four diminutive sessile polyps, measuring 2-3 mm in size, were found in the rectosigmoid colon; Polypectomy was performed with cold forceps 2.   The colonic mucosa appeared normal in the sigmoid colon and rectum 3.   Small internal hemorrhoids  RECOMMENDATIONS: 1.  Await biopsy results 2.  Increase lubiprostone (Amitiza) to 24 mcg twice daily 3.  Office followup in about 6 weeks 4.  If polyps removed today are adenomatous, then  you will need a full colonoscopy [ eSigned:  Beverley Fiedler, MD 01/02/2013 4:18 PM  [id number] Corliss Skains, MD Majel Homer MD The Patient

## 2013-01-02 NOTE — Op Note (Signed)
[facility] [address] [place], [address]   ENDOSCOPY PROCEDURE REPORT  PATIENT: [name], [name]  MR#: [id number] BIRTHDATE: 1986-09-08 , 26  yrs. old GENDER: Female ENDOSCOPIST: Beverley Fiedler, MD REFERRED BY:  Manus Rudd PROCEDURE DATE:  01/02/2013 PROCEDURE:  EGD w/ biopsy for H.pylori ASA CLASS:     Class II INDICATIONS:  Epigastric pain. MEDICATIONS: MAC sedation, administered by CRNA and propofol (Diprivan) 400mg  IV TOPICAL ANESTHETIC: Cetacaine Spray  DESCRIPTION OF PROCEDURE: After the risks benefits and alternatives of the procedure were thoroughly explained, informed consent was obtained.  The LB-GIF Q180 [id number] endoscope was introduced through the mouth and advanced to the second portion of the duodenum. Without limitations.  The instrument was slowly withdrawn as the mucosa was fully examined.     ESOPHAGUS: The mucosa of the esophagus appeared normal.  STOMACH: Moderate gastritis (inflammation) was found in the gastric body and gastric antrum.  Biopsies were taken in the antrum and angularis.  DUODENUM: The duodenal mucosa showed no abnormalities in the bulb and second portion of the duodenum.  Retroflexed views revealed no abnormalities.     The scope was then withdrawn from the patient and the procedure completed.  COMPLICATIONS: There were no complications. ENDOSCOPIC IMPRESSION: 1.   The mucosa of the esophagus appeared normal 2.   Gastritis (inflammation) was found in the gastric body and gastric antrum; biopsies were taken in the antrum and angularis 3.   The duodenal mucosa showed no abnormalities in the bulb and second portion of the duodenum  RECOMMENDATIONS: 1.  Await pathology results 2.  Follow-up of helicobacter pylori status, treat if indicated 3.  Continue taking your PPI (omeprazole) once daily.  It is best to be taken 20-30 minutes prior to breakfast meal.  eSigned:  Beverley Fiedler, MD 01/02/2013 4:10  PM [id number] Corliss Skains, MD, Majel Homer MD, and The Patient

## 2013-01-02 NOTE — Progress Notes (Signed)
Patient did not experience any of the following events: a burn prior to discharge; a fall within the facility; wrong site/side/patient/procedure/implant event; or a hospital transfer or hospital admission upon discharge from the facility. (G8907) Patient did not have preoperative order for IV antibiotic SSI prophylaxis. (G8918)  

## 2013-01-02 NOTE — Patient Instructions (Addendum)
Discharge instructions given with verbal understanding. Handouts on polyps and gastritis given. Resume previous medications. YOU HAD AN ENDOSCOPIC PROCEDURE TODAY AT THE [facility]: Refer to the procedure report that was given to you for any specific questions about what was found during the examination.  If the procedure report does not answer your questions, please call your gastroenterologist to clarify.  If you requested that your care partner not be given the details of your procedure findings, then the procedure report has been included in a sealed envelope for you to review at your convenience later.  YOU SHOULD EXPECT: Some feelings of bloating in the abdomen. Passage of more gas than usual.  Walking can help get rid of the air that was put into your GI tract during the procedure and reduce the bloating. If you had a lower endoscopy (such as a colonoscopy or flexible sigmoidoscopy) you may notice spotting of blood in your stool or on the toilet paper. If you underwent a bowel prep for your procedure, then you may not have a normal bowel movement for a few days.  DIET: Your first meal following the procedure should be a light meal and then it is ok to progress to your normal diet.  A half-sandwich or bowl of soup is an example of a good first meal.  Heavy or fried foods are harder to digest and may make you feel nauseous or bloated.  Likewise meals heavy in dairy and vegetables can cause extra gas to form and this can also increase the bloating.  Drink plenty of fluids but you should avoid alcoholic beverages for 24 hours.  ACTIVITY: Your care partner should take you home directly after the procedure.  You should plan to take it easy, moving slowly for the rest of the day.  You can resume normal activity the day after the procedure however you should NOT DRIVE or use heavy machinery for 24 hours (because of the sedation medicines used during the test).    SYMPTOMS TO REPORT  IMMEDIATELY: A gastroenterologist can be reached at any hour.  During normal business hours, 8:30 AM to 5:00 PM Monday through Friday, call [phone].  After hours and on weekends, please call the GI answering service at [phone] who will take a message and have the physician on call contact you.   Following lower endoscopy (colonoscopy or flexible sigmoidoscopy):  Excessive amounts of blood in the stool  Significant tenderness or worsening of abdominal pains  Swelling of the abdomen that is new, acute  Fever of 100F or higher  Following upper endoscopy (EGD)  Vomiting of blood or coffee ground material  New chest pain or pain under the shoulder blades  Painful or persistently difficult swallowing  New shortness of breath  Fever of 100F or higher  Black, tarry-looking stools  FOLLOW UP: If any biopsies were taken you will be contacted by phone or by letter within the next 1-3 weeks.  Call your gastroenterologist if you have not heard about the biopsies in 3 weeks.  Our staff will call the home number listed on your records the next business day following your procedure to check on you and address any questions or concerns that you may have at that time regarding the information given to you following your procedure. This is a courtesy call and so if there is no answer at the home number and we have not heard from you through the emergency physician on call, we will assume that you  have returned to your regular daily activities without incident.  SIGNATURES/CONFIDENTIALITY: You and/or your care partner have signed paperwork which will be entered into your electronic medical record.  These signatures attest to the fact that that the information above on your After Visit Summary has been reviewed and is understood.  Full responsibility of the confidentiality of this discharge information lies with you and/or your care-partner.  

## 2013-01-02 NOTE — Progress Notes (Signed)
Called to room to assist during endoscopic procedure.  Patient ID and intended procedure confirmed with present staff. Received instructions for my participation in the procedure from the performing physician.  

## 2013-01-03 ENCOUNTER — Telehealth: Payer: Self-pay | Admitting: *Deleted

## 2013-01-03 NOTE — Telephone Encounter (Signed)
No answer, message left for the patient. 

## 2013-01-06 ENCOUNTER — Other Ambulatory Visit (INDEPENDENT_AMBULATORY_CARE_PROVIDER_SITE_OTHER): Payer: Self-pay | Admitting: Surgery

## 2013-01-08 ENCOUNTER — Encounter: Payer: Self-pay | Admitting: Internal Medicine

## 2013-02-01 ENCOUNTER — Other Ambulatory Visit: Payer: Self-pay

## 2013-02-11 ENCOUNTER — Encounter: Payer: Self-pay | Admitting: Internal Medicine

## 2013-02-11 ENCOUNTER — Telehealth: Payer: Self-pay | Admitting: Family Medicine

## 2013-02-11 NOTE — Telephone Encounter (Signed)
After hours line:  She has a headache and swelling in her hands and feet and is concerned about this being due to elevated blood pressure due to her history of gestational hypertension.  She is not pregnant currently. She is s/p tubal ligation.   After discussing options with the patient, she says that she will get her blood pressure checked at the pharmacy. If it is >150/90, she will ask to be seen as a SDA. If SBP >140s, she will get an appointment with her PCP at his next available appointment.   She was advised to go the ED if her headaches worsened significantly overnight.

## 2013-02-13 ENCOUNTER — Ambulatory Visit: Payer: Medicaid Other | Admitting: Internal Medicine

## 2013-03-11 ENCOUNTER — Telehealth: Payer: Self-pay | Admitting: Sports Medicine

## 2013-03-11 NOTE — Telephone Encounter (Signed)
24 Hour Emergency Line:  Reports vague nausea with meals over past week, worsened in past 2 days.  No vomiting, no fevers, no chills.  Only 2 small bowel movements over last 48 hours.  Pt concerned about chronic constipation  On Methadone but no bowel regimen.  Hx of GI issues on Amitiza. No other meds tried.  Discussed Red flags with pt - no fevers, chills, body aches.    Given option to  try MiraLax q 4 hours until bowel movement.   Pt will call for follow up appointment tomorrow and will likely need Abdominal X-ray with Bowel Regimen.  Consider suppository if no effect from MiraLax.   Andrena Mews, DO [facility] Resident - PGY-2 03/11/2013 7:56 PM

## 2013-03-12 ENCOUNTER — Ambulatory Visit (INDEPENDENT_AMBULATORY_CARE_PROVIDER_SITE_OTHER): Payer: Medicaid Other | Admitting: Family Medicine

## 2013-03-12 ENCOUNTER — Encounter: Payer: Self-pay | Admitting: Family Medicine

## 2013-03-12 VITALS — BP 117/70 | HR 84 | Temp 98.6°F | Ht 64.0 in | Wt 146.0 lb

## 2013-03-12 DIAGNOSIS — G43909 Migraine, unspecified, not intractable, without status migrainosus: Secondary | ICD-10-CM

## 2013-03-12 DIAGNOSIS — K5909 Other constipation: Secondary | ICD-10-CM

## 2013-03-12 DIAGNOSIS — K5900 Constipation, unspecified: Secondary | ICD-10-CM

## 2013-03-12 MED ORDER — DIPHENHYDRAMINE HCL 25 MG PO TABS: 25.0000 mg | ORAL_TABLET | Freq: Once | ORAL | Status: DC

## 2013-03-12 MED ORDER — METOCLOPRAMIDE HCL 10 MG PO TABS: 10.0000 mg | ORAL_TABLET | Freq: Once | ORAL | Status: DC

## 2013-03-12 MED ORDER — VERAPAMIL HCL 80 MG PO TABS: 80.0000 mg | ORAL_TABLET | Freq: Three times a day (TID) | ORAL | Status: DC

## 2013-03-12 MED ORDER — DEXAMETHASONE SODIUM PHOSPHATE 10 MG/ML IJ SOLN
10.0000 mg | Freq: Once | INTRAMUSCULAR | Status: AC
  Administered 2013-03-12: 10 mg via INTRAMUSCULAR

## 2013-03-12 MED ORDER — GLYCERIN (LAXATIVE) 2 G RE SUPP: 1.0000 | Freq: Once | RECTAL | Status: DC

## 2013-03-12 MED ORDER — POLYETHYLENE GLYCOL 3350 17 GM/SCOOP PO POWD: 17.0000 g | Freq: Every day | ORAL | Status: DC

## 2013-03-12 NOTE — Progress Notes (Signed)
Subjective:     Patient ID: Rhonda Cabrera, female   DOB: 12/15/1986, 27 y.o.   MRN: [id number]  HPI 27 yo F presents for same day visit to address the following:  1. Migraine headaches: since teen. Gets 3-4 headaches per weeks. Headaches last several hours. Sometimes she goes to bed and wakes up with headache. She currently has a headache that is 8/10, L side, retroorbital. Pain is usually unilateral sometimes L, sometimes R. She rarely has b/l temple headaches/ HA associated with nausea, dizziness, worsening pain when sitting up or standing. HA relieved with rest. She treats HA with aleve once daily. She tries not to take medication for HAs. She has a history of opiod abuse, she is currently on methadone.   2. Nausea: x 3 days. Started after eating olive garden. No vomiting. No fever. Some chills. Patient is sexually active with husband, 5 children. S/p tubal and cholecystectomy.   3. Constipation: chronic. Last BM 4 days ago. Usually has BM ever 3-4 days. Hard, small volume. Has hx of hemorrhoid and has occasional blood on tissues after BM. No weight loss.   Review of Systems As per HPI     Objective:   Physical Exam General appearance: alert, cooperative and no distress Eyes: conjunctivae/corneas clear. PERRL, EOM's intact. Fundi benign. Abdomen: soft, non-tender; bowel sounds normal; no masses,  no organomegaly Neurologic: Alert and oriented X 3, normal strength and tone. Normal symmetric reflexes. Normal coordination and gait     Assessment and Plan:

## 2013-03-12 NOTE — Patient Instructions (Addendum)
Irais,  Thank you for coming in today:  1. Migraine: Treating your current migraine today with  Depo medrol 10 mg IM Pick up and take benadryl 25 mg tablet and reglan 10 mg (must take benadryl with reglan to prevent reglan side effect).  Put leftovers aside in case we need to abort another long lasting headache.   Preventing future headaches: Careful diet: avoid caffeine, excessive sugar or salt.  Most cook at home and eat at home.  Take verapamil three times daily   2. Chronic constipation Glycerin suppository x 1  Start miralax 1/4 cap daily, slowly work to 1 cap daily increase as needed to get to goal of 1 soft BM daily.   Dr. Armen Pickup

## 2013-03-12 NOTE — Assessment & Plan Note (Signed)
Treating your current migraine today with  Depo medrol 10 mg IM Pick up and take benadryl 25 mg tablet and reglan 10 mg (must take benadryl with reglan to prevent reglan side effect).  Put leftovers aside in case we need to abort another long lasting headache.   Preventing future headaches: Careful diet: avoid caffeine, excessive sugar or salt.  Most cook at home and eat at home.  Take verapamil three times daily

## 2013-03-12 NOTE — Assessment & Plan Note (Signed)
A: chronic constipation. No bowel regimen. P: Glycerin suppository Miralax, taper up to goal of one soft BM daily

## 2013-03-14 ENCOUNTER — Telehealth: Payer: Self-pay | Admitting: Family Medicine

## 2013-03-14 NOTE — Telephone Encounter (Signed)
Pt asking if ok to increase meralax,i instructed her to stay on 17 grams power daily for 5 days and if not better to call be back.she voiced understanding. Denys Labree, Virgel Bouquet

## 2013-03-14 NOTE — Telephone Encounter (Signed)
Patient would like to speak to the nurse about the meds that were prescribed the other day.

## 2013-03-21 ENCOUNTER — Telehealth: Payer: Self-pay | Admitting: Family Medicine

## 2013-03-21 NOTE — Telephone Encounter (Signed)
Emergency line - patient has a headache right now.  She wants to to go Urgent Care tomorrow and is asking me if the physician would give her a shot to help with pain.  She was seen a few weeks ago at our clinic and received a shot that relieved her HA.  I told patient to try to take oral pain medications tonight and get some rest.  If HA persists tomorrow, she can go to Urgent Care and request a shot for pain.  Patient understands plan.

## 2013-03-31 ENCOUNTER — Encounter: Payer: Self-pay | Admitting: Family Medicine

## 2013-03-31 ENCOUNTER — Ambulatory Visit (INDEPENDENT_AMBULATORY_CARE_PROVIDER_SITE_OTHER): Payer: Medicaid Other | Admitting: Family Medicine

## 2013-03-31 VITALS — BP 113/67 | HR 54 | Ht 64.0 in | Wt 145.0 lb

## 2013-03-31 DIAGNOSIS — G43909 Migraine, unspecified, not intractable, without status migrainosus: Secondary | ICD-10-CM

## 2013-03-31 DIAGNOSIS — G43709 Chronic migraine without aura, not intractable, without status migrainosus: Secondary | ICD-10-CM

## 2013-03-31 MED ORDER — QUETIAPINE FUMARATE 100 MG PO TABS: 150.0000 mg | ORAL_TABLET | Freq: Every day | ORAL | Status: DC

## 2013-03-31 MED ORDER — DEXAMETHASONE SODIUM PHOSPHATE 10 MG/ML IJ SOLN
10.0000 mg | Freq: Once | INTRAMUSCULAR | Status: AC
  Administered 2013-03-31: 10 mg via INTRAMUSCULAR

## 2013-03-31 MED ORDER — PROPRANOLOL HCL 40 MG PO TABS: ORAL_TABLET | ORAL | Status: DC

## 2013-03-31 MED ORDER — METHADONE HCL 10 MG/5ML PO SOLN: 35.0000 mg | Freq: Every day | ORAL | Status: DC

## 2013-03-31 NOTE — Patient Instructions (Signed)
For your migraine today, we are giving you another steroid shot.  After this, you really should not have another for several months.  When you get home, take another one of your benadryl and reglan tablets. Start taking propranolol two times per day.  Do this for two weeks, then increase your dose to 2 pills two times per day. Plan on coming back to see me in one month.

## 2013-04-07 NOTE — Progress Notes (Signed)
Patient ID: Rhonda Cabrera, female   DOB: 1986/09/27, 27 y.o.   MRN: [id number] Subjective: The patient is a 27 y.o. year old female who presents today for f/u migraines.  Pt continues to have daily headaches, same character as at previous visit.  Medication/shot at last visit helped for about 1 week.  Tried to start verapamil but had nightmares so stopped taking it.  No new symptoms.  No visual changes, lack of coordination.  Patient's past medical, social, and family history were reviewed and updated as appropriate. History  Substance Use Topics  . Smoking status: Current Every Day Smoker -- 1.00 packs/day    Types: Cigarettes  . Smokeless tobacco: Never Used  . Alcohol Use: No   Objective:  Filed Vitals:   03/31/13 1031  BP: 113/67  Pulse: 54   Gen: NAD Neuro: CN2-12 intact, no visual field defects, strength 5/5, gait normal  Assessment/Plan:  Please also see individual problems in problem list for problem-specific plans.

## 2013-04-07 NOTE — Assessment & Plan Note (Signed)
Discussed long-term dangers of steroid injections.  Will give another today but would NOT be OK with repeat for another 2-3 months.  D/c verapamil, change to propranolol.  RTC 4 weeks for recheck.

## 2013-05-05 ENCOUNTER — Ambulatory Visit: Payer: Medicaid Other | Admitting: Family Medicine

## 2013-06-17 ENCOUNTER — Telehealth: Payer: Self-pay | Admitting: Family Medicine

## 2013-06-17 NOTE — Telephone Encounter (Signed)
Pt called the after hours line due to dysphagia.  This has been ongoing for 2-3 weeks now.  States this is not an acute issue and just wanted to know if we could prescribe her medication for this.  Recommended she call practice in the Am to make a SDA.    Twana First Paulina Fusi, DO of [facility] 06/17/2013, 10:06 PM

## 2013-07-06 ENCOUNTER — Emergency Department (INDEPENDENT_AMBULATORY_CARE_PROVIDER_SITE_OTHER): Payer: Medicaid Other

## 2013-07-06 ENCOUNTER — Encounter (HOSPITAL_COMMUNITY): Payer: Self-pay | Admitting: *Deleted

## 2013-07-06 ENCOUNTER — Emergency Department (INDEPENDENT_AMBULATORY_CARE_PROVIDER_SITE_OTHER)
Admission: EM | Admit: 2013-07-06 | Discharge: 2013-07-06 | Disposition: A | Discharge status: Home / Self Care | Payer: Medicaid Other | Source: Home / Self Care | Attending: Family Medicine | Admitting: Family Medicine

## 2013-07-06 DIAGNOSIS — M778 Other enthesopathies, not elsewhere classified: Secondary | ICD-10-CM

## 2013-07-06 DIAGNOSIS — M65839 Other synovitis and tenosynovitis, unspecified forearm: Secondary | ICD-10-CM

## 2013-07-06 MED ORDER — DICLOFENAC POTASSIUM 50 MG PO TABS: 50.0000 mg | ORAL_TABLET | Freq: Three times a day (TID) | ORAL | Status: DC

## 2013-07-06 NOTE — ED Provider Notes (Signed)
History    CSN: [id number] Arrival date & time 07/06/13  1630  First MD Initiated Contact with Patient 07/06/13 1645     Chief Complaint  Patient presents with  . Wrist Pain   (Consider location/radiation/quality/duration/timing/severity/associated sxs/prior Treatment) Patient is a 27 y.o. female presenting with wrist pain. The history is provided by the patient.  Wrist Pain This is a new problem. The current episode started 2 days ago (NKI., denies overuse or work ). The problem occurs constantly. The problem has been gradually worsening.   Past Medical History  Diagnosis Date  . Bipolar 1 disorder   . Opiate addiction   . Abnormal Pap smear   . Depression   . Polysubstance abuse   . Hypertension   . Anxiety   . Chlamydia 07/12/2012   Past Surgical History  Procedure Laterality Date  . Vaginal deliveries    . Tubal ligation  09/08/2011    Procedure: POST PARTUM TUBAL LIGATION;  Surgeon: Scheryl Darter, MD;  Location: [facility];  Service: Gynecology;  Laterality: Bilateral;  . Cholecystectomy  11/04/2012    Procedure: LAPAROSCOPIC CHOLECYSTECTOMY WITH INTRAOPERATIVE CHOLANGIOGRAM;  Surgeon: Wilmon Arms. Corliss Skains, MD;  Location: [facility];  Service: General;  Laterality: N/A;   Family History  Problem Relation Age of Onset  . Drug abuse Mother   . Drug abuse Father   . Diabetes Maternal Grandmother   . Hypertension Maternal Grandmother   . Diabetes Paternal Grandmother   . Diabetes Paternal Grandfather    History  Substance Use Topics  . Smoking status: Current Every Day Smoker -- 1.00 packs/day    Types: Cigarettes  . Smokeless tobacco: Never Used  . Alcohol Use: No   OB History   Grav Para Term Preterm Abortions TAB SAB Ect Mult Living   7 6 3 3 1 1   1 6      Review of Systems  Constitutional: Negative.   Musculoskeletal: Negative for joint swelling.  Skin: Negative.     Allergies  Review of patient's allergies indicates no known allergies.  Home Medications    Current Outpatient Rx  Name  Route  Sig  Dispense  Refill  . busPIRone (BUSPAR) 5 MG tablet   Oral   Take 5 mg by mouth 3 (three) times daily.         . citalopram (CELEXA) 40 MG tablet   Oral   Take 40 mg by mouth daily.         . methadone (DOLOPHINE) 10 MG/5ML solution   Oral   Take 17.5 mLs (35 mg total) by mouth daily.         . Multiple Vitamin (MULTIVITAMIN WITH MINERALS) TABS   Oral   Take 1 tablet by mouth daily.         . polyethylene glycol powder (GLYCOLAX/MIRALAX) powder   Oral   Take 17 g by mouth daily.   3350 g   1   . QUEtiapine (SEROQUEL) 100 MG tablet   Oral   Take 1.5 tablets (150 mg total) by mouth at bedtime.         . diclofenac (CATAFLAM) 50 MG tablet   Oral   Take 1 tablet (50 mg total) by mouth 3 (three) times daily.   30 tablet   0   . diphenhydrAMINE (BENADRYL) 25 MG tablet   Oral   Take 1 tablet (25 mg total) by mouth once.   30 tablet   0     As needed  for status migrainus with shot of steroi ...   . metoCLOPramide (REGLAN) 10 MG tablet   Oral   Take 1 tablet (10 mg total) by mouth once. As needed for status migrainus with shot of steroid and benadryl tablet.   10 tablet   0   . propranolol (INDERAL) 40 MG tablet      Take one pill two times per day for 2 weeks then increase to 2 pills two times per day   120 tablet   0    BP 111/60  Pulse 76  Temp(Src) 98.2 F (36.8 C) (Oral)  Resp 16  SpO2 100%  LMP 06/22/2013  Breastfeeding? No Physical Exam  Nursing note and vitals reviewed. Constitutional: She is oriented to person, place, and time. She appears well-developed and well-nourished. She appears distressed.  Musculoskeletal: She exhibits tenderness.       Right wrist: She exhibits decreased range of motion and tenderness. She exhibits no bony tenderness, no swelling, no effusion and no crepitus.  Neurological: She is alert and oriented to person, place, and time.  Skin: Skin is warm and dry.    ED  Course  Procedures (including critical care time) Labs Reviewed - No data to display Dg Wrist Complete Right  07/06/2013   *RADIOLOGY REPORT*  Clinical Data: Pain  RIGHT WRIST - COMPLETE 3+ VIEW  Comparison: None  Findings: There is no evidence of fracture or dislocation.  There is no evidence of arthropathy or other focal bone abnormality. Soft tissues are unremarkable.  IMPRESSION: Negative examination.   Original Report Authenticated By: Signa Kell, M.D.   1. Tendonitis of wrist, right     MDM  X-rays reviewed and report per radiologist.   Linna Hoff, MD 07/06/13 [phone]

## 2013-07-06 NOTE — ED Notes (Signed)
C/o R wrist pain onset 1 1/2 weeks ago.  No known injury. Asking for something for pain.

## 2013-07-31 ENCOUNTER — Emergency Department (HOSPITAL_COMMUNITY): Payer: Medicaid Other

## 2013-07-31 ENCOUNTER — Emergency Department (HOSPITAL_COMMUNITY)
Admission: EM | Admit: 2013-07-31 | Discharge: 2013-08-01 | Disposition: A | Discharge status: Home / Self Care | Payer: Medicaid Other | Attending: Emergency Medicine | Admitting: Emergency Medicine

## 2013-07-31 ENCOUNTER — Telehealth: Payer: Self-pay | Admitting: Family Medicine

## 2013-07-31 ENCOUNTER — Encounter (HOSPITAL_COMMUNITY): Payer: Self-pay | Admitting: Emergency Medicine

## 2013-07-31 DIAGNOSIS — F411 Generalized anxiety disorder: Secondary | ICD-10-CM | POA: Insufficient documentation

## 2013-07-31 DIAGNOSIS — Z8619 Personal history of other infectious and parasitic diseases: Secondary | ICD-10-CM | POA: Insufficient documentation

## 2013-07-31 DIAGNOSIS — S91331A Puncture wound without foreign body, right foot, initial encounter: Secondary | ICD-10-CM

## 2013-07-31 DIAGNOSIS — I10 Essential (primary) hypertension: Secondary | ICD-10-CM | POA: Insufficient documentation

## 2013-07-31 DIAGNOSIS — S91309A Unspecified open wound, unspecified foot, initial encounter: Secondary | ICD-10-CM | POA: Insufficient documentation

## 2013-07-31 DIAGNOSIS — F17200 Nicotine dependence, unspecified, uncomplicated: Secondary | ICD-10-CM | POA: Insufficient documentation

## 2013-07-31 DIAGNOSIS — Y9301 Activity, walking, marching and hiking: Secondary | ICD-10-CM | POA: Insufficient documentation

## 2013-07-31 DIAGNOSIS — F319 Bipolar disorder, unspecified: Secondary | ICD-10-CM | POA: Insufficient documentation

## 2013-07-31 DIAGNOSIS — W268XXA Contact with other sharp object(s), not elsewhere classified, initial encounter: Secondary | ICD-10-CM | POA: Insufficient documentation

## 2013-07-31 DIAGNOSIS — Y929 Unspecified place or not applicable: Secondary | ICD-10-CM | POA: Insufficient documentation

## 2013-07-31 DIAGNOSIS — Z79899 Other long term (current) drug therapy: Secondary | ICD-10-CM | POA: Insufficient documentation

## 2013-07-31 MED ORDER — OXYCODONE-ACETAMINOPHEN 5-325 MG PO TABS
1.0000 | ORAL_TABLET | Freq: Once | ORAL | Status: AC
  Administered 2013-07-31: 1 via ORAL
  Filled 2013-07-31: qty 1

## 2013-07-31 NOTE — ED Notes (Signed)
PT. STEPPED ON A NAIL TODAY AT RIGHT FOOT . NO BLEEDING . LAST TETANUS IMMUNIZATION 3 YEARS AGO. AMBULATORY.

## 2013-07-31 NOTE — ED Provider Notes (Signed)
CSN: [id number]     Arrival date & time 07/31/13  2224 History    This chart was scribed for Rhonda Morn, NP working with Lyanne Co, MD by Quintella Reichert, ED Scribe. This patient was seen in room TR05C/TR05C and the patient's care was started at 11:05 PM.     Chief Complaint  Patient presents with  . Foot Injury    Patient is a 27 y.o. female presenting with foot injury. The history is provided by the patient. No language interpreter was used.  Foot Injury Location:  Foot Injury: yes   Mechanism of injury: stab wound   Stab injury:    Number of wounds:  One   Penetrating object: nail.   Length of penetrating object: "deep"   Inflicted by:  Self (stepped on nail)   Suspected intent:  Accidental Foot location:  Sole of R foot Pain details:    Severity:  Moderate   Timing:  Constant Chronicity:  New Relieved by:  Nothing Worsened by:  Nothing tried Ineffective treatments:  None tried Associated symptoms: no muscle weakness, no numbness and no tingling     HPI Comments: Rhonda Cabrera is a 27 y.o. female who presents to the Emergency Department complaining of a right foot injury that pt sustained pta when she was walking barefoot and stepped on nail which went "deep" into the instep of her foot.  She states that the nail stayed stuck in her skin and her mother had to pull it out.  Presently the area is not bleeding.  She applied alcohol and antibiotic ointment to the area prior to arrival.  She currently complains of constant, moderate pain to the area.  Last tetanus vaccination was 3 years ago.    PCP is Dr. Louanne Belton   Past Medical History  Diagnosis Date  . Bipolar 1 disorder   . Opiate addiction   . Abnormal Pap smear   . Depression   . Polysubstance abuse   . Hypertension   . Anxiety   . Chlamydia 07/12/2012    Past Surgical History  Procedure Laterality Date  . Vaginal deliveries    . Tubal ligation  09/08/2011    Procedure: POST PARTUM TUBAL LIGATION;   Surgeon: Scheryl Darter, MD;  Location: [facility];  Service: Gynecology;  Laterality: Bilateral;  . Cholecystectomy  11/04/2012    Procedure: LAPAROSCOPIC CHOLECYSTECTOMY WITH INTRAOPERATIVE CHOLANGIOGRAM;  Surgeon: Wilmon Arms. Corliss Skains, MD;  Location: [facility];  Service: General;  Laterality: N/A;    Family History  Problem Relation Age of Onset  . Drug abuse Mother   . Drug abuse Father   . Diabetes Maternal Grandmother   . Hypertension Maternal Grandmother   . Diabetes Paternal Grandmother   . Diabetes Paternal Grandfather     History  Substance Use Topics  . Smoking status: Current Every Day Smoker -- 1.00 packs/day    Types: Cigarettes  . Smokeless tobacco: Never Used  . Alcohol Use: No    OB History   Grav Para Term Preterm Abortions TAB SAB Ect Mult Living   7 6 3 3 1 1   1 6        Review of Systems  Skin: Positive for wound.  Neurological: Negative for weakness and numbness.  All other systems reviewed and are negative.      Allergies  Review of patient's allergies indicates no known allergies.  Home Medications   Current Outpatient Rx  Name  Route  Sig  Dispense  Refill  .  busPIRone (BUSPAR) 5 MG tablet   Oral   Take 5 mg by mouth 3 (three) times daily.         . diclofenac (CATAFLAM) 50 MG tablet   Oral   Take 1 tablet (50 mg total) by mouth 3 (three) times daily.   30 tablet   0   . diphenhydrAMINE (BENADRYL) 25 MG tablet   Oral   Take 1 tablet (25 mg total) by mouth once.   30 tablet   0     As needed for status migrainus with shot of steroi ...   . methadone (DOLOPHINE) 10 MG/5ML solution   Oral   Take 17.5 mLs (35 mg total) by mouth daily.         . polyethylene glycol powder (GLYCOLAX/MIRALAX) powder   Oral   Take 17 g by mouth daily.   3350 g   1    BP 107/71  Pulse 93  Temp(Src) 98.3 F (36.8 C) (Oral)  Resp 14  SpO2 97%  LMP 07/22/2013  Breastfeeding? No  Physical Exam  Nursing note and vitals reviewed. Constitutional:  She is oriented to person, place, and time. She appears well-developed and well-nourished. No distress.  HENT:  Head: Normocephalic and atraumatic.  Eyes: EOM are normal.  Neck: Neck supple. No tracheal deviation present.  Cardiovascular: Normal rate.   Pulmonary/Chest: Effort normal. No respiratory distress.  Musculoskeletal: Normal range of motion.  Neurological: She is alert and oriented to person, place, and time.  Skin: Skin is warm and dry.  Puncture wound to the instep of right foot.  Psychiatric: She has a normal mood and affect. Her behavior is normal.    ED Course  Procedures (including critical care time)  DIAGNOSTIC STUDIES: Oxygen Saturation is 97% on room air, normal by my interpretation.    COORDINATION OF CARE: 11:10 PM-Discussed treatment plan which includes imaging and pain medication with pt at bedside and pt agreed to plan.    Labs Reviewed - No data to display   Dg Foot Complete Right  07/31/2013   *RADIOLOGY REPORT*  Clinical Data: Stepped on nail.  RIGHT FOOT COMPLETE - 3+ VIEW  Comparison: None.  Findings: No radiodense foreign body.  No subcutaneous emphysema. No fracture or malalignment.  IMPRESSION: Negative for radiodense foreign body.  No osseous injury.   Original Report Authenticated By: Tiburcio Pea    No diagnosis found.  Discussed with Dr. Patria Mane.  Tetanus up to date.  Due to outside exposure to puncture wound by a nail, will cover with antibiotic. MDM      I personally performed the services described in this documentation, which was scribed in my presence. The recorded information has been reviewed and is accurate.   Jimmye Norman, NP 08/07/13 1527

## 2013-07-31 NOTE — Telephone Encounter (Signed)
After hours line  Pt called and explained that she stepped on a rusty nail this afternoon around 2pm. She says the nail went very deep into her foot but did not come out of the top. She states that her pain is tolerble but naproxen is not helping. She wants to know if she should go to teh ED to get a tetanus shot. She describes the wound as a small black dot and surrounding erythema but no drainage or warmth. She denies chills, sweats, and fever.   I have reviewed the guidelines and given that she is unsure of if she has had 3 doses in her life she should be given tetanus vaccine and tetanus Ig as soon as possible.  Despite my feeling like she could symptomatically make it until morning to be seen in the clinic I advised her to seek help in the ED given the guidelines I have reviewed.   Murtis Sink, MD [facility] Resident, PGY-2 07/31/2013, 9:59 PM

## 2013-08-01 MED ORDER — HYDROCODONE-ACETAMINOPHEN 5-325 MG PO TABS: 1.0000 | ORAL_TABLET | Freq: Four times a day (QID) | ORAL | Status: DC | PRN

## 2013-08-01 MED ORDER — CIPROFLOXACIN HCL 500 MG PO TABS: 500.0000 mg | ORAL_TABLET | Freq: Two times a day (BID) | ORAL | Status: DC

## 2013-08-01 NOTE — ED Provider Notes (Signed)
CSN: [id number]     Arrival date & time 07/31/13  2224 History     First MD Initiated Contact with Patient 07/31/13 2258     Chief Complaint  Patient presents with  . Foot Injury   (Consider location/radiation/quality/duration/timing/severity/associated sxs/prior Treatment) HPI  Past Medical History  Diagnosis Date  . Bipolar 1 disorder   . Opiate addiction   . Abnormal Pap smear   . Depression   . Polysubstance abuse   . Hypertension   . Anxiety   . Chlamydia 07/12/2012   Past Surgical History  Procedure Laterality Date  . Vaginal deliveries    . Tubal ligation  09/08/2011    Procedure: POST PARTUM TUBAL LIGATION;  Surgeon: Scheryl Darter, MD;  Location: [facility];  Service: Gynecology;  Laterality: Bilateral;  . Cholecystectomy  11/04/2012    Procedure: LAPAROSCOPIC CHOLECYSTECTOMY WITH INTRAOPERATIVE CHOLANGIOGRAM;  Surgeon: Wilmon Arms. Corliss Skains, MD;  Location: [facility];  Service: General;  Laterality: N/A;   Family History  Problem Relation Age of Onset  . Drug abuse Mother   . Drug abuse Father   . Diabetes Maternal Grandmother   . Hypertension Maternal Grandmother   . Diabetes Paternal Grandmother   . Diabetes Paternal Grandfather    History  Substance Use Topics  . Smoking status: Current Every Day Smoker -- 1.00 packs/day    Types: Cigarettes  . Smokeless tobacco: Never Used  . Alcohol Use: No   OB History   Grav Para Term Preterm Abortions TAB SAB Ect Mult Living   7 6 3 3 1 1   1 6      Review of Systems  Allergies  Review of patient's allergies indicates no known allergies.  Home Medications   Current Outpatient Rx  Name  Route  Sig  Dispense  Refill  . busPIRone (BUSPAR) 5 MG tablet   Oral   Take 5 mg by mouth 3 (three) times daily.         . diclofenac (CATAFLAM) 50 MG tablet   Oral   Take 1 tablet (50 mg total) by mouth 3 (three) times daily.   30 tablet   0   . diphenhydrAMINE (BENADRYL) 25 MG tablet   Oral   Take 1 tablet (25 mg total) by  mouth once.   30 tablet   0     As needed for status migrainus with shot of steroi ...   . methadone (DOLOPHINE) 10 MG/5ML solution   Oral   Take 17.5 mLs (35 mg total) by mouth daily.         . polyethylene glycol powder (GLYCOLAX/MIRALAX) powder   Oral   Take 17 g by mouth daily.   3350 g   1   . QUEtiapine (SEROQUEL) 300 MG tablet   Oral   Take 300 mg by mouth at bedtime.         . ciprofloxacin (CIPRO) 500 MG tablet   Oral   Take 1 tablet (500 mg total) by mouth every 12 (twelve) hours.   10 tablet   0   . HYDROcodone-acetaminophen (NORCO/VICODIN) 5-325 MG per tablet   Oral   Take 1 tablet by mouth every 6 (six) hours as needed for pain.   10 tablet   0    BP 107/71  Pulse 93  Temp(Src) 98.3 F (36.8 C) (Oral)  Resp 14  SpO2 97%  LMP 07/22/2013  Breastfeeding? No Physical Exam  ED Course   Procedures (including critical care time)  Labs  Reviewed - No data to display Dg Foot Complete Right  07/31/2013   *RADIOLOGY REPORT*  Clinical Data: Stepped on nail.  RIGHT FOOT COMPLETE - 3+ VIEW  Comparison: None.  Findings: No radiodense foreign body.  No subcutaneous emphysema. No fracture or malalignment.  IMPRESSION: Negative for radiodense foreign body.  No osseous injury.   Original Report Authenticated By: Tiburcio Pea   1. Puncture wound of foot without foreign body, right, initial encounter     MDM  Puncture wound via nail, patient barefoot at time of injury.  Tetanus up to date.   Patient cleaned wound immediately after injury.  Discussed with Dr. Patria Mane, will cover with antibiotic.  Patient to follow-up with PCP.  Jimmye Norman, NP 08/01/13 [phone]

## 2013-08-03 NOTE — ED Provider Notes (Signed)
Medical screening examination/treatment/procedure(s) were performed by non-physician practitioner and as supervising physician I was immediately available for consultation/collaboration.   Lyanne Co, MD 08/03/13 [phone]

## 2013-08-08 NOTE — ED Provider Notes (Signed)
Medical screening examination/treatment/procedure(s) were performed by non-physician practitioner and as supervising physician I was immediately available for consultation/collaboration.  Timmie Dugue M Kavonte Bearse, MD 08/08/13 2306 

## 2013-09-26 ENCOUNTER — Telehealth: Payer: Self-pay | Admitting: Family Medicine

## 2013-09-26 ENCOUNTER — Ambulatory Visit (INDEPENDENT_AMBULATORY_CARE_PROVIDER_SITE_OTHER): Payer: Medicaid Other | Admitting: Sports Medicine

## 2013-09-26 ENCOUNTER — Encounter: Payer: Self-pay | Admitting: Sports Medicine

## 2013-09-26 VITALS — BP 123/71 | HR 112 | Temp 98.7°F | Wt 153.0 lb

## 2013-09-26 DIAGNOSIS — F17200 Nicotine dependence, unspecified, uncomplicated: Secondary | ICD-10-CM

## 2013-09-26 DIAGNOSIS — R300 Dysuria: Secondary | ICD-10-CM | POA: Insufficient documentation

## 2013-09-26 LAB — POCT URINALYSIS DIPSTICK

## 2013-09-26 LAB — POCT UA - MICROSCOPIC ONLY

## 2013-09-26 MED ORDER — NITROFURANTOIN MONOHYD MACRO 100 MG PO CAPS: 100.0000 mg | ORAL_CAPSULE | Freq: Two times a day (BID) | ORAL | Status: DC

## 2013-09-26 NOTE — Telephone Encounter (Signed)
Agree with recommendations. Thanks!

## 2013-09-26 NOTE — Patient Instructions (Addendum)
   Positive for UTI  Antibiotic prescribed  2 Aleve twice a day as needed for discomfort   If you need anything prior to your next visit please call the clinic. Please Bring all medications or accurate medication list with you to each appointment; an accurate medication list is essential in providing you the best care possible.     Urinary Tract Infection Urinary tract infections (UTIs) can develop anywhere along your urinary tract. Your urinary tract is your body's drainage system for removing wastes and extra water. Your urinary tract includes two kidneys, two ureters, a bladder, and a urethra. Your kidneys are a pair of bean-shaped organs. Each kidney is about the size of your fist. They are located below your ribs, one on each side of your spine. CAUSES Infections are caused by microbes, which are microscopic organisms, including fungi, viruses, and bacteria. These organisms are so small that they can only be seen through a microscope. Bacteria are the microbes that most commonly cause UTIs. SYMPTOMS  Symptoms of UTIs may vary by age and gender of the patient and by the location of the infection. Symptoms in young women typically include a frequent and intense urge to urinate and a painful, burning feeling in the bladder or urethra during urination. Older women and men are more likely to be tired, shaky, and weak and have muscle aches and abdominal pain. A fever may mean the infection is in your kidneys. Other symptoms of a kidney infection include pain in your back or sides below the ribs, nausea, and vomiting. DIAGNOSIS To diagnose a UTI, your caregiver will ask you about your symptoms. Your caregiver also will ask to provide a urine sample. The urine sample will be tested for bacteria and white blood cells. White blood cells are made by your body to help fight infection. TREATMENT  Typically, UTIs can be treated with medication. Because most UTIs are caused by a bacterial infection, they  usually can be treated with the use of antibiotics. The choice of antibiotic and length of treatment depend on your symptoms and the type of bacteria causing your infection. HOME CARE INSTRUCTIONS  If you were prescribed antibiotics, take them exactly as your caregiver instructs you. Finish the medication even if you feel better after you have only taken some of the medication.  Drink enough water and fluids to keep your urine clear or pale yellow.  Avoid caffeine, tea, and carbonated beverages. They tend to irritate your bladder.  Empty your bladder often. Avoid holding urine for long periods of time.  Empty your bladder before and after sexual intercourse.  After a bowel movement, women should cleanse from front to back. Use each tissue only once. SEEK MEDICAL CARE IF:   You have back pain.  You develop a fever.  Your symptoms do not begin to resolve within 3 days. SEEK IMMEDIATE MEDICAL CARE IF:   You have severe back pain or lower abdominal pain.  You develop chills.  You have nausea or vomiting.  You have continued burning or discomfort with urination. MAKE SURE YOU:   Understand these instructions.  Will watch your condition.  Will get help right away if you are not doing well or get worse. Document Released: 09/13/2005 Document Revised: 06/04/2012 Document Reviewed: 01/12/2012 [facility] Patient Information 2014 [place], [place].

## 2013-09-26 NOTE — Assessment & Plan Note (Signed)
Encouraged to quit smoking.  

## 2013-09-26 NOTE — Progress Notes (Signed)
[facility] Rhonda Cabrera - 27 y.o. female MRN [id number]  Date of birth: 12-12-1986  CC, HPI, INTERVAL HISTORY & ROS  Rhonda Cabrera is here today for: Evaluation of dysuria and frequency    She reports a three-day history of urinary discomfort.  Nocturia.  Dysuria.  Frequency.  Pt denies any fevers, chills, or rigors.  History  Past Medical, Surgical, Social, and Family History Reviewed per EMR Medications and Allergies reviewed and all updated if necessary. Objective Findings  VITALS: HR: 112 bpm  BP: 123/71 mmHg  TEMP: 98.7 F (37.1 C) (Oral)  RESP:    HT:    WT: 153 lb (69.4 kg)  BMI:     BP Readings from Last 3 Encounters:  09/26/13 123/71  07/31/13 107/71  07/06/13 111/60   Wt Readings from Last 3 Encounters:  09/26/13 153 lb (69.4 kg)  03/31/13 145 lb (65.772 kg)  03/12/13 146 lb (66.225 kg)     PHYSICAL EXAM: GENERAL:  adult AA female. In no discomfort; no respiratory distress  PSYCH: alert and appropriate, good insight   HNEENT:   CARDIO:  tachycardic, regular, no murmur   LUNGS: CTA B, no wheezes, no crackles  ABDOMEN:  superpubic tenderness, no CVA tenderness   EXTREM:    GU:   SKIN:    Assessment & Plan   Problems addressed today: General Plan & Pt Instructions:  1. Dysuria       Positive for UTI  Antibiotic prescribed  2 Aleve twice a day as needed for discomfort     For further discussion of A/P and for follow up issues see problem based charting if applicable.

## 2013-09-26 NOTE — Telephone Encounter (Signed)
Patient called to notify us that she is having urinary frequency and burning. Patient believes it is a UTI. She was requesting any OTC meds that may help. I recommended azo for pain relief, but stated that she needs to come in to clinic for evaluation and antibiotic prescription. She acknowledged understanding and was in agreement with plan.

## 2013-09-26 NOTE — Assessment & Plan Note (Signed)
Urinalysis consistent with urinary tract infection. Given lower tract symptoms will treat with nitrofurantoin

## 2013-10-23 ENCOUNTER — Other Ambulatory Visit: Payer: Self-pay

## 2013-11-07 ENCOUNTER — Encounter (HOSPITAL_COMMUNITY): Payer: Self-pay | Admitting: Emergency Medicine

## 2013-11-07 ENCOUNTER — Emergency Department (HOSPITAL_COMMUNITY)
Admission: EM | Admit: 2013-11-07 | Discharge: 2013-11-07 | Disposition: A | Discharge status: Home / Self Care | Payer: Medicaid Other | Source: Home / Self Care

## 2013-11-07 DIAGNOSIS — M65839 Other synovitis and tenosynovitis, unspecified forearm: Secondary | ICD-10-CM

## 2013-11-07 NOTE — ED Notes (Signed)
Patient is being treated with minor son.  Son is being treated as well

## 2013-11-07 NOTE — ED Notes (Signed)
Arm pain, wrist pain, history of the same.  Symptoms for 6 months.  No knew injury.  Patient enjoys "journaling" patient is right handed.  Seen for the same. Took medicines, but reports no improvement and continues to hurt.

## 2013-11-07 NOTE — ED Provider Notes (Signed)
CSN: [id number]     Arrival date & time 11/07/13  1646 History   First MD Initiated Contact with Patient 11/07/13 1835     Chief Complaint  Patient presents with  . Arm Pain   (Consider location/radiation/quality/duration/timing/severity/associated sxs/prior Treatment) HPI Comments: C/O same thumb, wrist, forearm pain for which she has been seen before, tx with a splint and NSAIDS. She continues to use the thumb/hand on a regular basis, primarily writing daily and has exacerbated the condition. She lost her splint "long time ago".   Past Medical History  Diagnosis Date  . Bipolar 1 disorder   . Opiate addiction   . Abnormal Pap smear   . Depression   . Polysubstance abuse   . Hypertension   . Anxiety   . Chlamydia 07/12/2012   Past Surgical History  Procedure Laterality Date  . Vaginal deliveries    . Tubal ligation  09/08/2011    Procedure: POST PARTUM TUBAL LIGATION;  Surgeon: Scheryl Darter, MD;  Location: [facility];  Service: Gynecology;  Laterality: Bilateral;  . Cholecystectomy  11/04/2012    Procedure: LAPAROSCOPIC CHOLECYSTECTOMY WITH INTRAOPERATIVE CHOLANGIOGRAM;  Surgeon: Wilmon Arms. Corliss Skains, MD;  Location: [facility];  Service: General;  Laterality: N/A;   Family History  Problem Relation Age of Onset  . Drug abuse Mother   . Drug abuse Father   . Diabetes Maternal Grandmother   . Hypertension Maternal Grandmother   . Diabetes Paternal Grandmother   . Diabetes Paternal Grandfather    History  Substance Use Topics  . Smoking status: Current Every Day Smoker -- 1.00 packs/day    Types: Cigarettes  . Smokeless tobacco: Never Used  . Alcohol Use: No   OB History   Grav Para Term Preterm Abortions TAB SAB Ect Mult Living   7 6 3 3 1 1   1 6      Review of Systems  Constitutional: Negative for fever, chills and activity change.  HENT: Negative.   Respiratory: Negative.   Cardiovascular: Negative.   Musculoskeletal:       As per HPI  Skin: Negative for color change,  pallor and rash.  Neurological: Negative.     Allergies  Review of patient's allergies indicates no known allergies.  Home Medications   Current Outpatient Rx  Name  Route  Sig  Dispense  Refill  . busPIRone (BUSPAR) 5 MG tablet   Oral   Take 5 mg by mouth 3 (three) times daily.         . ciprofloxacin (CIPRO) 500 MG tablet   Oral   Take 1 tablet (500 mg total) by mouth every 12 (twelve) hours.   10 tablet   0   . diclofenac (CATAFLAM) 50 MG tablet   Oral   Take 1 tablet (50 mg total) by mouth 3 (three) times daily.   30 tablet   0   . diphenhydrAMINE (BENADRYL) 25 MG tablet   Oral   Take 1 tablet (25 mg total) by mouth once.   30 tablet   0     As needed for status migrainus with shot of steroi ...   . HYDROcodone-acetaminophen (NORCO/VICODIN) 5-325 MG per tablet   Oral   Take 1 tablet by mouth every 6 (six) hours as needed for pain.   10 tablet   0   . methadone (DOLOPHINE) 10 MG/5ML solution   Oral   Take 17.5 mLs (35 mg total) by mouth daily.         Marland Kitchen  nitrofurantoin, macrocrystal-monohydrate, (MACROBID) 100 MG capsule   Oral   Take 1 capsule (100 mg total) by mouth 2 (two) times daily.   14 capsule   0   . polyethylene glycol powder (GLYCOLAX/MIRALAX) powder   Oral   Take 17 g by mouth daily.   3350 g   1   . QUEtiapine (SEROQUEL) 300 MG tablet   Oral   Take 300 mg by mouth at bedtime.          BP 125/73  Pulse 61  Temp(Src) 98.5 F (36.9 C) (Oral)  Resp 16  SpO2 99% Physical Exam  Nursing note and vitals reviewed. Constitutional: She is oriented to person, place, and time. She appears well-developed and well-nourished. No distress.  HENT:  Head: Normocephalic and atraumatic.  Eyes: EOM are normal.  Cardiovascular: Normal rate.   Pulmonary/Chest: Effort normal. No respiratory distress.  Musculoskeletal:  Tenderness along R extensor pollicus longus. + Finklestein sign. Full ROM. No deformity or bony tenderness.   Neurological:  She is alert and oriented to person, place, and time. No cranial nerve deficit.  Skin: Skin is warm and dry.    ED Course  Procedures (including critical care time) Labs Review Labs Reviewed - No data to display Imaging Review No results found.      MDM   1. Tendinitis of forearm   2. Tendinitis of finger      Must wear the splint longer and limit the work and movement that is causing the pain. Apply ice and stop using the heat.   Hayden Rasmussen, NP 11/07/13 [phone]

## 2014-02-21 ENCOUNTER — Encounter (HOSPITAL_COMMUNITY): Payer: Self-pay | Admitting: Emergency Medicine

## 2014-02-21 ENCOUNTER — Emergency Department (HOSPITAL_COMMUNITY)
Admission: EM | Admit: 2014-02-21 | Discharge: 2014-02-21 | Disposition: A | Discharge status: Home / Self Care | Payer: Medicaid Other | Attending: Emergency Medicine | Admitting: Emergency Medicine

## 2014-02-21 DIAGNOSIS — Z79899 Other long term (current) drug therapy: Secondary | ICD-10-CM | POA: Insufficient documentation

## 2014-02-21 DIAGNOSIS — I10 Essential (primary) hypertension: Secondary | ICD-10-CM | POA: Insufficient documentation

## 2014-02-21 DIAGNOSIS — Z8619 Personal history of other infectious and parasitic diseases: Secondary | ICD-10-CM | POA: Insufficient documentation

## 2014-02-21 DIAGNOSIS — K029 Dental caries, unspecified: Secondary | ICD-10-CM | POA: Insufficient documentation

## 2014-02-21 DIAGNOSIS — F411 Generalized anxiety disorder: Secondary | ICD-10-CM | POA: Insufficient documentation

## 2014-02-21 DIAGNOSIS — F17200 Nicotine dependence, unspecified, uncomplicated: Secondary | ICD-10-CM | POA: Insufficient documentation

## 2014-02-21 DIAGNOSIS — Z98811 Dental restoration status: Secondary | ICD-10-CM | POA: Insufficient documentation

## 2014-02-21 DIAGNOSIS — F319 Bipolar disorder, unspecified: Secondary | ICD-10-CM | POA: Insufficient documentation

## 2014-02-21 MED ORDER — HYDROCODONE-ACETAMINOPHEN 5-325 MG PO TABS
2.0000 | ORAL_TABLET | Freq: Once | ORAL | Status: AC
Start: 2014-02-21 — End: 2014-02-21
  Administered 2014-02-21: 2 via ORAL
  Filled 2014-02-21: qty 2

## 2014-02-21 MED ORDER — HYDROCODONE-ACETAMINOPHEN 5-325 MG PO TABS: 2.0000 | ORAL_TABLET | ORAL | Status: DC | PRN

## 2014-02-21 MED ORDER — PENICILLIN V POTASSIUM 500 MG PO TABS: 500.0000 mg | ORAL_TABLET | Freq: Four times a day (QID) | ORAL | Status: AC

## 2014-02-21 NOTE — ED Notes (Signed)
Pt c/o R sided dental pain onset last night, pt tearful in triage.

## 2014-02-21 NOTE — Discharge Instructions (Signed)
Dental Pain A tooth ache may be caused by cavities (tooth decay). Cavities expose the nerve of the tooth to air and hot or cold temperatures. It may come from an infection or abscess (also called a boil or furuncle) around your tooth. It is also often caused by dental caries (tooth decay). This causes the pain you are having. DIAGNOSIS  Your caregiver can diagnose this problem by exam. TREATMENT   If caused by an infection, it may be treated with medications which kill germs (antibiotics) and pain medications as prescribed by your caregiver. Take medications as directed.  Only take over-the-counter or prescription medicines for pain, discomfort, or fever as directed by your caregiver.  Whether the tooth ache today is caused by infection or dental disease, you should see your dentist as soon as possible for further care. SEEK MEDICAL CARE IF: The exam and treatment you received today has been provided on an emergency basis only. This is not a substitute for complete medical or dental care. If your problem worsens or new problems (symptoms) appear, and you are unable to meet with your dentist, call or return to this location. SEEK IMMEDIATE MEDICAL CARE IF:   You have a fever.  You develop redness and swelling of your face, jaw, or neck.  You are unable to open your mouth.  You have severe pain uncontrolled by pain medicine. MAKE SURE YOU:   Understand these instructions.  Will watch your condition.  Will get help right away if you are not doing well or get worse. Document Released: 12/04/2005 Document Revised: 02/26/2012 Document Reviewed: 07/22/2008 [facility] Patient Information 2014 [place].   Emergency Department Resource Guide 1) Find a Doctor and Pay Out of Pocket Although you won't have to find out who is covered by your insurance plan, it is a good idea to ask around and get recommendations. You will then need to call the office and see if the doctor you have chosen will  accept you as a new patient and what types of options they offer for patients who are self-pay. Some doctors offer discounts or will set up payment plans for their patients who do not have insurance, but you will need to ask so you aren't surprised when you get to your appointment.  2) Contact Your Local Health Department Not all health departments have doctors that can see patients for sick visits, but many do, so it is worth a call to see if yours does. If you don't know where your local health department is, you can check in your phone book. The CDC also has a tool to help you locate your state's health department, and many state websites also have listings of all of their local health departments.  3) Find a [facility] If your illness is not likely to be very severe or complicated, you may want to try a walk in clinic. These are popping up all over the country in pharmacies, drugstores, and shopping centers. They're usually staffed by nurse practitioners or physician assistants that have been trained to treat common illnesses and complaints. They're usually fairly quick and inexpensive. However, if you have serious medical issues or chronic medical problems, these are probably not your best option.  No Primary Care Doctor: - Call Health Connect at  [phone] - they can help you locate a primary care doctor that  accepts your insurance, provides certain services, etc. - Physician Referral Service- [phone]  Chronic Pain Problems: Organization  Address  Phone   Notes  [facility]  [phone] Patients need to be referred by their primary care doctor.   Medication Assistance: Organization         Address  Phone   Notes  [facility] Medication [facility] [address] [place], [place] [address] [phone] --Must be a resident of [facility] -- Must have NO insurance coverage whatsoever (no Medicaid/ Medicare, etc.) -- The pt.  MUST have a primary care doctor that directs their care regularly and follows them in the community   [facility]ist  [phone]   [employer]  [phone]    Agencies that provide inexpensive medical care: Organization         Address  Phone   Notes  [place]  [phone]   [facility]    [phone]   [facility] [place], [place] [address] [phone]   [place] [address] [place] [phone]   Planned Parenthood    [phone]   [facility]    [phone]   [place] and [place] [address], [place] Phone:  [phone], Fax:  [phone] Hours of Operation:  9 am - 6 pm, M-F.  Also accepts Medicaid/Medicare and self-pay.  [facility] for [place], [address], [place] Phone: [phone], Fax: [phone]. Hours of Operation:  8:30 am - 5:30 pm, M-F.  Also accepts Medicaid and self-pay.  [facility] [place] [address] [place] Phone: [phone]   [place], [place], [place] [phone], Ext. [phone] Mondays & Thursdays: 7-9 AM.  First 15 patients are seen on a first come, first serve basis.    [place] Providers:  Organization         Address  Phone   Notes  [facility] [address], [place] [phone] Also accepts self-pay patients.  [facility] [address], [place]  [phone]   [place], [address], [place] [phone]   [facility] [address], [place] [phone]   Lucianne Lei [address], [place]   [phone] Only accepts [place] Access [place] patients after they have their name applied to their card.   Self-Pay (no insurance) in  [facility]:  Organization         Address  Phone   Notes  Sickle Cell Patients, [facility] [place] [phone]   [facility] [place] [phone]   [facility] [place]  [place], [place], [place] [phone]   [facility]/Dr. Osei-Bonsu  [address] [place] or [address], [place] [phone] Phone number for both [place] and [place] locations is the same.  Urgent Medical and [facility] [address] [place] [phone]   [facility] [address], [place] or [address] [phone]   [facility] [address], [place] [phone], phone; [phone], fax Sees patients 1st and 3rd Saturday of every month.  Must not qualify  for public or private insurance (i.e. Medicaid, Medicare, [place] Health Choice, Veterans' Benefits)  Household income should be no more than 200% of the poverty level The clinic cannot treat you if you are pregnant or think you are pregnant  Sexually transmitted diseases are not treated at the clinic.   Dental Care: Organization         Address  Phone  Notes  [facility] [place] [phone] Accepts children up to age 84 who are enrolled in [place] or [place]; pregnant women with a Medicaid card; and children who have applied for Medicaid or [place] Health Choice, but were declined, whose parents can pay a reduced fee at time of service.  [facility] Department of [facility]  [address], [place] [phone] Accepts children up to age 6 who are enrolled in [place] or [place]; pregnant women with a Medicaid card; and children who have applied for Medicaid or [place] Health Choice, but were declined, whose parents  can pay a reduced fee at time of service.  [place] Adult Dental Access PROGRAM  [place] [phone] Patients are seen by appointment only. Walk-ins are not accepted. [place] will see patients 31 years of age and older. Monday - Tuesday (8am-5pm) Most Wednesdays (8:30-5pm) $30 per visit, cash only  [facility] Adult Dental Access PROGRAM  [address], [facility] [phone] Patients are seen by appointment only. Walk-ins are not accepted. [place] will see patients 7 years of age and older. One Wednesday Evening (Monthly: Volunteer Based).  $30 per visit, cash only  [place]  [phone] for adults; Children under age 69, call Graduate Pediatric Dentistry at [phone]. Children aged 3-14, please call [phone] to request a pediatric application.  Dental services are provided in all areas of dental care including fillings, crowns and bridges, complete and partial dentures, implants, gum treatment, root canals, and extractions. Preventive care is also provided. Treatment is provided to both adults and children. Patients are selected via a lottery and there is often a waiting list.   [facility] [address] [place]  [phone] [email or web address]   [facility] [address] [place], [place] [phone], Ext. [phone] Second and Fourth Thursday of each month, opens at 6:30 AM; Clinic ends at 9 AM.  Patients are seen on a first-come first-served basis, and a limited number are seen during each clinic.   [facility]  [address] Hillard Danker [place], [place] [phone]   Eligibility Requirements You must have lived in [place], [place], or [place] counties for at least the last three months.   You cannot be eligible for state or federal sponsored [employer], including [employer], [place], or [employer].   You generally cannot be eligible for healthcare  insurance through your employer.    How to apply: Eligibility screenings are held every Tuesday and Wednesday afternoon from 1:00 pm until 4:00 pm. You do not need an appointment for the interview!  [facility] [address], [place], [place]   [place]  [place] Department  [place]  [phone]    Behavioral Health Resources in the Community: Intensive Outpatient Programs Organization         Address  Phone  Notes  [place] [facility] [address] [place], [place] [phone]   [facility] Outpatient [address] [place], [place]   ADS: Alcohol & Drug Svcs [address], [place], [place]   [place] [address]  [place], [place] or [phone]   Substance Abuse Resources Organization         Address  Phone  Notes  Alcohol and Drug Services  [phone]   [place]  [phone]   [facility] [place]   Chinita Pester  [phone]   Residential & Outpatient Substance Abuse Program  [phone]   Psychological Services Organization         Address  Phone  Notes  [facility] [place]  [place]  [phone]   [place] [address] [place] or [phone]    Mobile Crisis Teams Organization         Address  Phone  Notes  [facility], Mobile Crisis Care Unit  [phone]   [facility] Psychotherapeutic Services  [address] [place], [place]   [place] Levels [address], [place] [phone]    Self-Help/Support Groups Organization         Address  Phone             Notes  [place]. of [place] - variety of support groups  [place] Call for more information  Narcotics Anonymous (NA),  [facility] [address], [employer] [place]  2 meetings at this location   Special educational needs teacher         Address  Phone  Notes  [facility]idential [facility] [place],    [place]  [phone]   [facility]  [address] [place] [phone], [place], [place]   [place], [place] [phone] Admissions: 8am-3pm M-F  Incentives Substance [place] [address],    [place], [place] [phone]   [facility] [address] [place], [place], [place]   [facility] [address],  [place], [place]   [facility] Programs - Intensive Outpatient [address], [place], [place]   [facility] [place].,  [place], [place] [phone] or [phone]   [facility] Treatment Services (RTS) [address], [place], [place] Accepts Medicaid  [facility] [place] [address]  [place] [phone] Substance Abuse/Addiction Treatment   [facility] Organization         Address  Phone  Notes  [facility]  [phone]   Domenic Schwab, PhD [address] Arlis [place], [place]   [phone] or [phone]   [place]   [address] [place], [place] [phone]   [facility] [address] [employer], [place] [phone] Insurance/Medicaid/sponsorship through [facility] and Families [address] [place]                                    [place], [place] [phone] [facility] [address], [place] [phone]    Dr. Adele Schilder  [phone]   [facility] [place]  [facility] 1) [address], [place] 2) [place] 3)  [place] [address], [place] [phone]  [phone]     [facility] [phone] or [phone] (After Hours)

## 2014-02-21 NOTE — ED Provider Notes (Signed)
CSN: [id number]     Arrival date & time 02/21/14  4742 History   First MD Initiated Contact with Patient 02/21/14 [phone]     Chief Complaint  Patient presents with  . Dental Pain     (Consider location/radiation/quality/duration/timing/severity/associated sxs/prior Treatment) HPI Comments: Patient presents with dental pain. She states she woke up tonight and developed a constant throbbing pain to her right teeth. She states she's had problems with them intermittently but she hasn't been to a dentist because she doesn't like to this. She denies any fevers or vomiting. She's not taking anything at home for the pain.  Patient is a 28 y.o. female presenting with tooth pain.  Dental Pain Associated symptoms: no fever and no neck pain     Past Medical History  Diagnosis Date  . Bipolar 1 disorder   . Opiate addiction   . Abnormal Pap smear   . Depression   . Polysubstance abuse   . Hypertension   . Anxiety   . Chlamydia 07/12/2012   Past Surgical History  Procedure Laterality Date  . Vaginal deliveries    . Tubal ligation  09/08/2011    Procedure: POST PARTUM TUBAL LIGATION;  Surgeon: Emeterio Reeve, MD;  Location: [facility];  Service: Gynecology;  Laterality: Bilateral;  . Cholecystectomy  11/04/2012    Procedure: LAPAROSCOPIC CHOLECYSTECTOMY WITH INTRAOPERATIVE CHOLANGIOGRAM;  Surgeon: Imogene Burn. Georgette Dover, MD;  Location: [facility];  Service: General;  Laterality: N/A;   Family History  Problem Relation Age of Onset  . Drug abuse Mother   . Drug abuse Father   . Diabetes Maternal Grandmother   . Hypertension Maternal Grandmother   . Diabetes Paternal Grandmother   . Diabetes Paternal Grandfather    History  Substance Use Topics  . Smoking status: Current Every Day Smoker -- 1.00 packs/day    Types: Cigarettes  . Smokeless tobacco: Never Used  . Alcohol Use: No   OB History   Grav Para Term Preterm Abortions TAB SAB Ect Mult Living   7 6 3 3 1 1   1 6      Review of Systems   Constitutional: Negative for fever.  HENT: Positive for dental problem.   Gastrointestinal: Negative for nausea and vomiting.  Musculoskeletal: Negative for neck pain.  Skin: Negative for rash and wound.      Allergies  Review of patient's allergies indicates no known allergies.  Home Medications   Current Outpatient Rx  Name  Route  Sig  Dispense  Refill  . gabapentin (NEURONTIN) 400 MG capsule   Oral   Take 400 mg by mouth 3 (three) times daily.         Marland Kitchen ibuprofen (ADVIL,MOTRIN) 200 MG tablet   Oral   Take 200 mg by mouth every 6 (six) hours as needed.         . methadone (DOLOPHINE) 10 MG/5ML solution   Oral   Take 17.5 mLs (35 mg total) by mouth daily.         . naproxen sodium (ANAPROX) 220 MG tablet   Oral   Take 220 mg by mouth 2 (two) times daily as needed.         Marland Kitchen QUEtiapine (SEROQUEL) 300 MG tablet   Oral   Take 300 mg by mouth at bedtime.         Marland Kitchen HYDROcodone-acetaminophen (NORCO/VICODIN) 5-325 MG per tablet   Oral   Take 2 tablets by mouth every 4 (four) hours as needed.   15  tablet   0   . penicillin v potassium (VEETID) 500 MG tablet   Oral   Take 1 tablet (500 mg total) by mouth 4 (four) times daily.   40 tablet   0    BP 121/76  Pulse 65  Temp(Src) 98 F (36.7 C) (Oral)  Resp 18  Ht 5\' 7"  (1.702 m)  Wt 160 lb (72.576 kg)  BMI 25.05 kg/m2  SpO2 98%  LMP 02/13/2014 Physical Exam  Constitutional: She is oriented to person, place, and time. She appears well-developed and well-nourished.  HENT:  Patient has multiple fillings in the right lower back molars. There's tenderness around the right upper and lower back molars. There is no induration or fluctuance. There is no facial swelling. There's no trismus.  Cardiovascular: Normal rate.   Pulmonary/Chest: Effort normal.  Neurological: She is alert and oriented to person, place, and time.  Skin: Skin is warm and dry.    ED Course  Procedures (including critical care  time) Labs Review Labs Reviewed - No data to display Imaging Review No results found.   EKG Interpretation None      MDM   Final diagnoses:  Dental caries   Patient is given a prescription for penicillin and Vicodin for pain. She was encouraged to followup with a dentist as soon as possible. She was given a list of outpatient dental clinic.    Malvin Johns, MD 02/21/14 [phone]

## 2014-03-03 ENCOUNTER — Encounter: Payer: Self-pay | Admitting: Family Medicine

## 2014-03-03 ENCOUNTER — Ambulatory Visit (INDEPENDENT_AMBULATORY_CARE_PROVIDER_SITE_OTHER): Payer: Medicaid Other | Admitting: Family Medicine

## 2014-03-03 VITALS — BP 118/78 | HR 70 | Ht 67.0 in | Wt 163.0 lb

## 2014-03-03 DIAGNOSIS — M654 Radial styloid tenosynovitis [de Quervain]: Secondary | ICD-10-CM | POA: Insufficient documentation

## 2014-03-03 NOTE — Assessment & Plan Note (Signed)
Thumb spica prescription today.   Referral to sports med for U/S and possible injection.

## 2014-03-03 NOTE — Patient Instructions (Signed)
Make an appointment for Sports Medicine on your way out today.    They will look at it with an ultrasound and get you feeling better.  Try the thumb spica splint.  If it's too expensive, what you're wearing will be okay.    Feel better

## 2014-03-03 NOTE — Progress Notes (Signed)
Subjective:    Rhonda Cabrera is a 28 y.o. female who presents to [facility] today for Right wrist pain:  1.  Wrist pain:  Present for about a year.  Putting off coming in due to time constraints, dealing with 5 children, etc.  Worse with texting, writing, or attempting to pick up children.  Had radiographs about 1 year ago, negative, told it was tendinitis.  Tx'ed with anti-inflammatories with no relief.   Had teeth removed last week, now taking Norco but without much relief.  Wearing splint (non-spica).    ROS as above per HPI, otherwise neg.   The following portions of the patient's history were reviewed and updated as appropriate: allergies, current medications, past medical history, family and social history, and problem list. Patient is a nonsmoker.    PMH reviewed.  Past Medical History  Diagnosis Date  . Bipolar 1 disorder   . Opiate addiction   . Abnormal Pap smear   . Depression   . Polysubstance abuse   . Hypertension   . Anxiety   . Chlamydia 07/12/2012   Past Surgical History  Procedure Laterality Date  . Vaginal deliveries    . Tubal ligation  09/08/2011    Procedure: POST PARTUM TUBAL LIGATION;  Surgeon: Emeterio Reeve, MD;  Location: [facility];  Service: Gynecology;  Laterality: Bilateral;  . Cholecystectomy  11/04/2012    Procedure: LAPAROSCOPIC CHOLECYSTECTOMY WITH INTRAOPERATIVE CHOLANGIOGRAM;  Surgeon: Imogene Burn. Georgette Dover, MD;  Location: [facility];  Service: General;  Laterality: N/A;    Medications reviewed. Current Outpatient Prescriptions  Medication Sig Dispense Refill  . gabapentin (NEURONTIN) 400 MG capsule Take 400 mg by mouth 3 (three) times daily.      Marland Kitchen HYDROcodone-acetaminophen (NORCO/VICODIN) 5-325 MG per tablet Take 2 tablets by mouth every 4 (four) hours as needed.  15 tablet  0  . ibuprofen (ADVIL,MOTRIN) 200 MG tablet Take 200 mg by mouth every 6 (six) hours as needed.      . methadone (DOLOPHINE) 10 MG/5ML solution Take 17.5 mLs (35 mg total) by mouth  daily.      . naproxen sodium (ANAPROX) 220 MG tablet Take 220 mg by mouth 2 (two) times daily as needed.      Marland Kitchen QUEtiapine (SEROQUEL) 300 MG tablet Take 300 mg by mouth at bedtime.       No current facility-administered medications for this visit.     Objective:   Physical Exam BP 118/78  Pulse 70  Ht 5\' 7"  (1.702 m)  Wt 163 lb (73.936 kg)  BMI 25.52 kg/m2  LMP 02/13/2014 Gen:  Alert, cooperative patient who appears stated age in no acute distress.  Vital signs reviewed. MKS: Left wrist WNL. Wearing wrist brace on right.  Upon removal, Right wrist with swelling directly over radial styloid.  No redness.  Tenderness directly over policus longus and styloid.    No results found for this or any previous visit (from the past 72 hour(s)).

## 2014-03-05 ENCOUNTER — Telehealth: Payer: Self-pay | Admitting: Family Medicine

## 2014-03-05 NOTE — Telephone Encounter (Signed)
Pt would like to know the 2 places where she can get the prescription for her wrist brace filled [facility] but she doesn't remember the other one Please advise

## 2014-03-12 ENCOUNTER — Ambulatory Visit: Payer: Medicaid Other | Admitting: Sports Medicine

## 2014-03-26 ENCOUNTER — Ambulatory Visit (INDEPENDENT_AMBULATORY_CARE_PROVIDER_SITE_OTHER): Payer: Medicaid Other | Admitting: Sports Medicine

## 2014-03-26 ENCOUNTER — Encounter: Payer: Self-pay | Admitting: Sports Medicine

## 2014-03-26 VITALS — BP 118/74 | HR 87 | Ht 67.0 in | Wt 163.0 lb

## 2014-03-26 DIAGNOSIS — M25539 Pain in unspecified wrist: Secondary | ICD-10-CM

## 2014-03-26 NOTE — Progress Notes (Signed)
   Subjective:    Patient ID: Rhonda Cabrera, female    DOB: [birth year], 28 y.o.   MRN: [id number]  HPI chief complaint: Right wrist pain  28 year old right-hand-dominant female comes in today complaining of several months of right sided right wrist pain. No trauma that she can recall but gradual onset of pain that began many months ago. Her pain is present mainly with wrist related activity such as repetitive ulnar and radial deviation. About 3-4 months ago she began to notice some swelling along the radial wrist as well. Swelling is not really affected much by activity. She has tried a wrist brace but it does not sound like it is a thumb spica brace. She denies any ulnar-sided wrist pain. No prior wrist surgeries. She was seen at the family practice center and referred to [place] for ultrasound evaluation. Other than the wrist brace she has not had any specific treatment for this problem.  Past medical history and current medications are reviewed No known drug allergies     Review of Systems     Objective:   Physical Exam Well-developed, well-nourished. No acute distress. Awake alert and oriented x3. Vital signs reviewed.  Right wrist: Full range of motion. No effusion. There is a palpable area of induration directly over the radial styloid. It is non-mobile. Noncompressible. Tender to palpation. Reproducible pain with Finkelstein's testing. No other bony or soft tissue tenderness to direct palpation. Negative Tinel's at the carpal tunnel. Good radial and ulnar pulses.  X-rays of the right wrist from July of 2014 are unremarkable  [facility] ultrasound of the right wrist was performed. Limited images of the radial wrist were obtained. Both long and short views were obtained. The patient has a markedly enlarged first extensor compartment directly over the radial styloid. There is significant tendon thickening with surrounding hypoechoic change. 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 No ganglion cyst is seen.       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 Given the significant thickening of the tendons in the first extensor compartment seen on ultrasound I am not optimistic that a cortisone injection will be of much benefit. Therefore, I will refer the patient to one of our local hand surgeons for their opinion. I'll defer definitive treatment to their discretion. The patient has Medicaid and will require a referral directly from her PCP. We will pass along a note making this request. In the meantime, I have given her a thumb spica brace to wear for comfort. Patient will return to me as needed.

## 2014-04-03 ENCOUNTER — Emergency Department (HOSPITAL_COMMUNITY)
Admission: EM | Admit: 2014-04-03 | Discharge: 2014-04-03 | Disposition: A | Discharge status: Home / Self Care | Payer: Medicaid Other | Attending: Emergency Medicine | Admitting: Emergency Medicine

## 2014-04-03 ENCOUNTER — Encounter (HOSPITAL_COMMUNITY): Payer: Self-pay | Admitting: Emergency Medicine

## 2014-04-03 DIAGNOSIS — N390 Urinary tract infection, site not specified: Secondary | ICD-10-CM | POA: Insufficient documentation

## 2014-04-03 DIAGNOSIS — F411 Generalized anxiety disorder: Secondary | ICD-10-CM | POA: Insufficient documentation

## 2014-04-03 DIAGNOSIS — F319 Bipolar disorder, unspecified: Secondary | ICD-10-CM | POA: Insufficient documentation

## 2014-04-03 DIAGNOSIS — Z8619 Personal history of other infectious and parasitic diseases: Secondary | ICD-10-CM | POA: Insufficient documentation

## 2014-04-03 DIAGNOSIS — F17200 Nicotine dependence, unspecified, uncomplicated: Secondary | ICD-10-CM | POA: Insufficient documentation

## 2014-04-03 DIAGNOSIS — Z79899 Other long term (current) drug therapy: Secondary | ICD-10-CM | POA: Insufficient documentation

## 2014-04-03 DIAGNOSIS — Z3202 Encounter for pregnancy test, result negative: Secondary | ICD-10-CM | POA: Insufficient documentation

## 2014-04-03 DIAGNOSIS — I10 Essential (primary) hypertension: Secondary | ICD-10-CM | POA: Insufficient documentation

## 2014-04-03 LAB — URINALYSIS, ROUTINE W REFLEX MICROSCOPIC
GLUCOSE, UA: NEGATIVE mg/dL
Ketones, ur: NEGATIVE mg/dL
Nitrite: POSITIVE — AB
PROTEIN: 100 mg/dL — AB
SPECIFIC GRAVITY, URINE: 1.027 (ref 1.005–1.030)
UROBILINOGEN UA: 2 mg/dL — AB (ref 0.0–1.0)
pH: 6 (ref 5.0–8.0)

## 2014-04-03 LAB — URINE MICROSCOPIC-ADD ON

## 2014-04-03 LAB — PREGNANCY, URINE: PREG TEST UR: NEGATIVE

## 2014-04-03 MED ORDER — HYDROCODONE-ACETAMINOPHEN 5-325 MG PO TABS
2.0000 | ORAL_TABLET | Freq: Once | ORAL | Status: AC
Start: 2014-04-03 — End: 2014-04-03
  Administered 2014-04-03: 2 via ORAL
  Filled 2014-04-03: qty 2

## 2014-04-03 MED ORDER — CEPHALEXIN 500 MG PO CAPS: 500.0000 mg | ORAL_CAPSULE | Freq: Two times a day (BID) | ORAL | Status: DC

## 2014-04-03 MED ORDER — NAPROXEN 500 MG PO TABS: 500.0000 mg | ORAL_TABLET | Freq: Two times a day (BID) | ORAL | Status: DC

## 2014-04-03 MED ORDER — CEPHALEXIN 500 MG PO CAPS
500.0000 mg | ORAL_CAPSULE | Freq: Once | ORAL | Status: AC
  Administered 2014-04-03: 500 mg via ORAL
  Filled 2014-04-03: qty 1

## 2014-04-03 NOTE — Discharge Instructions (Signed)
Urinary Tract Infection  Urinary tract infections (UTIs) can develop anywhere along your urinary tract. Your urinary tract is your body's drainage system for removing wastes and extra water. Your urinary tract includes two kidneys, two ureters, a bladder, and a urethra. Your kidneys are a pair of bean-shaped organs. Each kidney is about the size of your fist. They are located below your ribs, one on each side of your spine.  CAUSES  Infections are caused by microbes, which are microscopic organisms, including fungi, viruses, and bacteria. These organisms are so small that they can only be seen through a microscope. Bacteria are the microbes that most commonly cause UTIs.  SYMPTOMS   Symptoms of UTIs may vary by age and gender of the patient and by the location of the infection. Symptoms in young women typically include a frequent and intense urge to urinate and a painful, burning feeling in the bladder or urethra during urination. Older women and men are more likely to be tired, shaky, and weak and have muscle aches and abdominal pain. A fever may mean the infection is in your kidneys. Other symptoms of a kidney infection include pain in your back or sides below the ribs, nausea, and vomiting.  DIAGNOSIS  To diagnose a UTI, your caregiver will ask you about your symptoms. Your caregiver also will ask to provide a urine sample. The urine sample will be tested for bacteria and white blood cells. White blood cells are made by your body to help fight infection.  TREATMENT   Typically, UTIs can be treated with medication. Because most UTIs are caused by a bacterial infection, they usually can be treated with the use of antibiotics. The choice of antibiotic and length of treatment depend on your symptoms and the type of bacteria causing your infection.  HOME CARE INSTRUCTIONS   If you were prescribed antibiotics, take them exactly as your caregiver instructs you. Finish the medication even if you feel better after you  have only taken some of the medication.   Drink enough water and fluids to keep your urine clear or pale yellow.   Avoid caffeine, tea, and carbonated beverages. They tend to irritate your bladder.   Empty your bladder often. Avoid holding urine for long periods of time.   Empty your bladder before and after sexual intercourse.   After a bowel movement, women should cleanse from front to back. Use each tissue only once.  SEEK MEDICAL CARE IF:    You have back pain.   You develop a fever.   Your symptoms do not begin to resolve within 3 days.  SEEK IMMEDIATE MEDICAL CARE IF:    You have severe back pain or lower abdominal pain.   You develop chills.   You have nausea or vomiting.   You have continued burning or discomfort with urination.  MAKE SURE YOU:    Understand these instructions.   Will watch your condition.   Will get help right away if you are not doing well or get worse.  Document Released: 09/13/2005 Document Revised: 06/04/2012 Document Reviewed: 01/12/2012  [employer] Patient Information 2014 [employer].

## 2014-04-03 NOTE — ED Provider Notes (Signed)
Medical screening examination/treatment/procedure(s) were performed by non-physician practitioner and as supervising physician I was immediately available for consultation/collaboration.   EKG Interpretation None        Blanchie Dessert, MD 04/03/14 2145

## 2014-04-03 NOTE — ED Provider Notes (Signed)
CSN: [id number]     Arrival date & time 04/03/14  2032 History  This chart was scribed for non-physician practitioner Antonietta Breach, working with Blanchie Dessert, MD by Donato Schultz, ED Scribe. This patient was seen in room WTR6/WTR6 and the patient's care was started at 9:13 PM.     Chief Complaint  Patient presents with  . Dysuria    Patient is a 28 y.o. female presenting with dysuria. The history is provided by the patient. No language interpreter was used.  Dysuria Associated symptoms: no fever, no flank pain, no vaginal discharge and no vomiting    HPI Comments: Rhonda Cabrera is a 27 y.o. female who presents to the Emergency Department complaining of constant dysuria and throbbing suprapubic pain that started two days ago.  The patient lists urgency, frequency, bilateral lower back pain, and hesitancy as associated symptoms.  She denies fever and vomiting as associated symptoms.  She states that she has been taking 800 mg of Ibuprofen, Aleve, and 95 mg tablets of Phenazopyridine Hydrochloride with no relief to her symptoms.    Past Medical History  Diagnosis Date  . Bipolar 1 disorder   . Opiate addiction   . Abnormal Pap smear   . Depression   . Polysubstance abuse   . Hypertension   . Anxiety   . Chlamydia 07/12/2012   Past Surgical History  Procedure Laterality Date  . Vaginal deliveries    . Tubal ligation  09/08/2011    Procedure: POST PARTUM TUBAL LIGATION;  Surgeon: Emeterio Reeve, MD;  Location: [facility];  Service: Gynecology;  Laterality: Bilateral;  . Cholecystectomy  11/04/2012    Procedure: LAPAROSCOPIC CHOLECYSTECTOMY WITH INTRAOPERATIVE CHOLANGIOGRAM;  Surgeon: Imogene Burn. Georgette Dover, MD;  Location: [facility];  Service: General;  Laterality: N/A;   Family History  Problem Relation Age of Onset  . Drug abuse Mother   . Drug abuse Father   . Diabetes Maternal Grandmother   . Hypertension Maternal Grandmother   . Diabetes Paternal Grandmother   . Diabetes Paternal  Grandfather    History  Substance Use Topics  . Smoking status: Current Every Day Smoker -- 1.00 packs/day    Types: Cigarettes  . Smokeless tobacco: Never Used  . Alcohol Use: No   OB History   Grav Para Term Preterm Abortions TAB SAB Ect Mult Living   7 6 3 3 1 1   1 6      Review of Systems  Constitutional: Negative for fever.  Gastrointestinal: Negative for vomiting.  Endocrine: Positive for polyuria.  Genitourinary: Positive for dysuria, urgency, difficulty urinating and pelvic pain. Negative for hematuria, flank pain, vaginal bleeding, vaginal discharge and vaginal pain.  Musculoskeletal: Positive for back pain.  Neurological: Negative for weakness and numbness.  All other systems reviewed and are negative.     Allergies  Review of patient's allergies indicates no known allergies.  Home Medications   Prior to Admission medications   Medication Sig Start Date End Date Taking? Authorizing Provider  gabapentin (NEURONTIN) 400 MG capsule Take 400 mg by mouth 3 (three) times daily.    Historical Provider, MD  HYDROcodone-acetaminophen (NORCO/VICODIN) 5-325 MG per tablet Take 2 tablets by mouth every 4 (four) hours as needed. 02/21/14   Malvin Johns, MD  methadone (DOLOPHINE) 10 MG/5ML solution Take 17.5 mLs (35 mg total) by mouth daily. 03/31/13   Vivi Ferns, MD  naproxen sodium (ANAPROX) 220 MG tablet Take 220 mg by mouth 2 (two) times daily as needed.  Historical Provider, MD  QUEtiapine (SEROQUEL) 300 MG tablet Take 300 mg by mouth at bedtime.    Historical Provider, MD   Triage Vitals: BP 125/70  Pulse 75  Temp(Src) 99.1 F (37.3 C) (Oral)  Resp 18  SpO2 99%  LMP 03/05/2014  Physical Exam  Nursing note and vitals reviewed. Constitutional: She is oriented to person, place, and time. She appears well-developed and well-nourished. No distress.  Patient fidgeting around exam room secondary to discomfort. She is in no acute distress. Nontoxic/nonseptic appearing.   HENT:  Head: Normocephalic and atraumatic.  Eyes: Conjunctivae and EOM are normal. No scleral icterus.  Neck: Normal range of motion.  Cardiovascular: Normal rate, regular rhythm and intact distal pulses.   Pulses:      Radial pulses are 2+ on the left side.  Pulmonary/Chest: Effort normal. No respiratory distress.  Abdominal: Soft. She exhibits no distension and no mass. There is tenderness (Suprapubic). There is no rebound, no guarding and no CVA tenderness.    No peritoneal signs or guarding  Musculoskeletal: Normal range of motion.  Neurological: She is alert and oriented to person, place, and time.  Skin: Skin is warm and dry. No rash noted. She is not diaphoretic. No erythema. No pallor.  Psychiatric: She has a normal mood and affect. Her behavior is normal.    ED Course  Procedures (including critical care time)  DIAGNOSTIC STUDIES: Oxygen Saturation is 99% on room air, normal by my interpretation.    COORDINATION OF CARE: 9:18 PM- Discussed a clinical suspicion of a UTI.  Discussed discharging the patient with a prescription for antibiotics.  The patient agreed to the treatment plan.   Labs Review Labs Reviewed  URINALYSIS, ROUTINE W REFLEX MICROSCOPIC - Abnormal; Notable for the following:    Color, Urine ORANGE (*)    APPearance CLOUDY (*)    Hgb urine dipstick SMALL (*)    Bilirubin Urine SMALL (*)    Protein, ur 100 (*)    Urobilinogen, UA 2.0 (*)    Nitrite POSITIVE (*)    Leukocytes, UA LARGE (*)    All other components within normal limits  URINE MICROSCOPIC-ADD ON - Abnormal; Notable for the following:    Squamous Epithelial / LPF MANY (*)    Bacteria, UA MANY (*)    All other components within normal limits  PREGNANCY, URINE   Imaging Review No results found.   EKG Interpretation None      MDM   Final diagnoses:  UTI (lower urinary tract infection)    Patient has been diagnosed with a UTI. Pt is afebrile, no CVA tenderness, normotensive,  and denies N/V. Patient to be d/c home with antibiotics and instructions to follow up with PCP if symptoms persist. Patient agreeable to plan with no unaddressed concerns.  I personally performed the services described in this documentation, which was scribed in my presence. The recorded information has been reviewed and is accurate.   Filed Vitals:   04/03/14 2042  BP: 125/70  Pulse: 75  Temp: 99.1 F (37.3 C)  TempSrc: Oral  Resp: 18  SpO2: 99%     Antonietta Breach, PA-C 04/03/14 2144

## 2014-04-03 NOTE — ED Notes (Addendum)
Pt reports centralized, lower groin pain that started yesterday. Pt also reports dysuria and bilateral flank tenderness. Pt denies nausea, emesis, or diarrhea. Pt reports similar symptoms with a previous UTI. Pt is A/O x4, in NAD, and vitals are WDL.

## 2014-04-21 ENCOUNTER — Telehealth: Payer: Self-pay | Admitting: Family Medicine

## 2014-04-21 ENCOUNTER — Emergency Department (INDEPENDENT_AMBULATORY_CARE_PROVIDER_SITE_OTHER)
Admission: EM | Admit: 2014-04-21 | Discharge: 2014-04-21 | Disposition: A | Discharge status: Home / Self Care | Payer: Medicaid Other | Source: Home / Self Care | Attending: Family Medicine | Admitting: Family Medicine

## 2014-04-21 ENCOUNTER — Encounter (HOSPITAL_COMMUNITY): Payer: Self-pay | Admitting: Emergency Medicine

## 2014-04-21 DIAGNOSIS — B3731 Acute candidiasis of vulva and vagina: Secondary | ICD-10-CM

## 2014-04-21 DIAGNOSIS — B373 Candidiasis of vulva and vagina: Secondary | ICD-10-CM

## 2014-04-21 LAB — POCT URINALYSIS DIP (DEVICE)
Bilirubin Urine: NEGATIVE
Glucose, UA: NEGATIVE mg/dL
Hgb urine dipstick: NEGATIVE
KETONES UR: NEGATIVE mg/dL
LEUKOCYTES UA: NEGATIVE
NITRITE: NEGATIVE
PH: 6 (ref 5.0–8.0)
PROTEIN: NEGATIVE mg/dL
Specific Gravity, Urine: 1.02 (ref 1.005–1.030)
Urobilinogen, UA: 1 mg/dL (ref 0.0–1.0)

## 2014-04-21 LAB — POCT PREGNANCY, URINE: PREG TEST UR: NEGATIVE

## 2014-04-21 MED ORDER — TERCONAZOLE 80 MG VA SUPP: 80.0000 mg | Freq: Every day | VAGINAL | Status: DC

## 2014-04-21 MED ORDER — FLUCONAZOLE 150 MG PO TABS: 150.0000 mg | ORAL_TABLET | Freq: Once | ORAL | Status: DC

## 2014-04-21 NOTE — ED Notes (Signed)
At bedside for physician evaluation

## 2014-04-21 NOTE — ED Notes (Signed)
Patient concerned for yeast infection, symptoms for 5 days, patient is certain this is a yeast infection

## 2014-04-21 NOTE — ED Provider Notes (Signed)
CSN: [id number]     Arrival date & time 04/21/14  1106 History   First MD Initiated Contact with Patient 04/21/14 1237     Chief Complaint  Patient presents with  . Vaginitis   (Consider location/radiation/quality/duration/timing/severity/associated sxs/prior Treatment) Patient is a 28 y.o. female presenting with vaginal itching. The history is provided by the patient.  Vaginal Itching This is a new problem. The current episode started more than 2 days ago (has been on mult abx recently for different med issues, now with itching , no d/c yet.). The problem has been gradually worsening. Pertinent negatives include no abdominal pain.    Past Medical History  Diagnosis Date  . Bipolar 1 disorder   . Opiate addiction   . Abnormal Pap smear   . Depression   . Polysubstance abuse   . Hypertension   . Anxiety   . Chlamydia 07/12/2012   Past Surgical History  Procedure Laterality Date  . Vaginal deliveries    . Tubal ligation  09/08/2011    Procedure: POST PARTUM TUBAL LIGATION;  Surgeon: Emeterio Reeve, MD;  Location: [facility];  Service: Gynecology;  Laterality: Bilateral;  . Cholecystectomy  11/04/2012    Procedure: LAPAROSCOPIC CHOLECYSTECTOMY WITH INTRAOPERATIVE CHOLANGIOGRAM;  Surgeon: Imogene Burn. Georgette Dover, MD;  Location: [facility];  Service: General;  Laterality: N/A;   Family History  Problem Relation Age of Onset  . Drug abuse Mother   . Drug abuse Father   . Diabetes Maternal Grandmother   . Hypertension Maternal Grandmother   . Diabetes Paternal Grandmother   . Diabetes Paternal Grandfather    History  Substance Use Topics  . Smoking status: Current Every Day Smoker -- 1.00 packs/day    Types: Cigarettes  . Smokeless tobacco: Never Used  . Alcohol Use: No   OB History   Grav Para Term Preterm Abortions TAB SAB Ect Mult Living   7 6 3 3 1 1   1 6      Review of Systems  Constitutional: Negative.   Gastrointestinal: Negative.  Negative for abdominal pain.  Genitourinary:  Negative for vaginal bleeding, vaginal discharge and menstrual problem.    Allergies  Review of patient's allergies indicates no known allergies.  Home Medications   Prior to Admission medications   Medication Sig Start Date End Date Taking? Authorizing Provider  cephALEXin (KEFLEX) 500 MG capsule Take 1 capsule (500 mg total) by mouth 2 (two) times daily. 04/03/14   Antonietta Breach, PA-C  fluconazole (DIFLUCAN) 150 MG tablet Take 1 tablet (150 mg total) by mouth once. Repeat in 1 week 04/21/14   Billy Fischer, MD  gabapentin (NEURONTIN) 400 MG capsule Take 400 mg by mouth 3 (three) times daily.    Historical Provider, MD  HYDROcodone-acetaminophen (NORCO/VICODIN) 5-325 MG per tablet Take 2 tablets by mouth every 4 (four) hours as needed. 02/21/14   Malvin Johns, MD  ibuprofen (ADVIL,MOTRIN) 800 MG tablet Take 800 mg by mouth every 8 (eight) hours as needed.    Historical Provider, MD  methadone (DOLOPHINE) 10 MG/5ML solution Take 17.5 mLs (35 mg total) by mouth daily. 03/31/13   Vivi Ferns, MD  naproxen (NAPROSYN) 500 MG tablet Take 1 tablet (500 mg total) by mouth 2 (two) times daily. 04/03/14   Antonietta Breach, PA-C  phenazopyridine (PYRIDIUM) 95 MG tablet Take 95 mg by mouth 3 (three) times daily as needed for pain.    Historical Provider, MD  QUEtiapine (SEROQUEL) 300 MG tablet Take 300 mg by mouth at  bedtime.    Historical Provider, MD  terconazole (TERAZOL 3) 80 MG vaginal suppository Place 1 suppository (80 mg total) vaginally at bedtime. 04/21/14   Billy Fischer, MD   BP 119/75  Pulse 84  Temp(Src) 99.8 F (37.7 C) (Oral)  Resp 14  SpO2 98%  LMP 04/05/2014 Physical Exam  Nursing note and vitals reviewed. Constitutional: She is oriented to person, place, and time. She appears well-developed and well-nourished.  Abdominal: Soft. Bowel sounds are normal. She exhibits no distension and no mass. There is no tenderness. There is no rebound and no guarding.  Neurological: She is alert and  oriented to person, place, and time.  Skin: Skin is warm and dry.    ED Course  Procedures (including critical care time) Labs Review Labs Reviewed  POCT URINALYSIS DIP (DEVICE)    Imaging Review No results found.   MDM   1. Candida vaginitis        Billy Fischer, MD 04/21/14 1250

## 2014-04-21 NOTE — Telephone Encounter (Signed)
Pt has a yeast infection Would like the one time pill called in Has an appt for tomorrow Please advise

## 2014-04-21 NOTE — Telephone Encounter (Signed)
Patient advised to wait for tomorrow.[place]

## 2014-04-22 ENCOUNTER — Ambulatory Visit: Payer: Medicaid Other | Admitting: Family Medicine

## 2014-06-02 ENCOUNTER — Ambulatory Visit (INDEPENDENT_AMBULATORY_CARE_PROVIDER_SITE_OTHER): Payer: Medicaid Other | Admitting: Emergency Medicine

## 2014-06-02 ENCOUNTER — Encounter: Payer: Self-pay | Admitting: Emergency Medicine

## 2014-06-02 VITALS — BP 123/80 | HR 67 | Wt 153.0 lb

## 2014-06-02 DIAGNOSIS — R300 Dysuria: Secondary | ICD-10-CM

## 2014-06-02 LAB — POCT UA - MICROSCOPIC ONLY

## 2014-06-02 LAB — POCT URINALYSIS DIPSTICK

## 2014-06-02 MED ORDER — CIPROFLOXACIN HCL 500 MG PO TABS: 500.0000 mg | ORAL_TABLET | Freq: Two times a day (BID) | ORAL | Status: DC

## 2014-06-02 NOTE — Patient Instructions (Signed)
It was nice to see you!  I have given you a prescription for an antibiotic.  Take 1 pill twice a day for 10 days. Alternate tylenol and ibuprofen every 4 hours during the day.  If you develop fevers or vomiting, please return right away. You should start to feel better in the next 2-3 days.

## 2014-06-02 NOTE — Progress Notes (Signed)
   Subjective:    Patient ID: Rhonda Cabrera, female    DOB: 07-31-1986, 28 y.o.   MRN: [id number]  HPI Rhonda Cabrera is here for a same-day appointment for dysuria.  She states she has been having pain with urination for the last few days. She has been taking over-the-counter AZO, with minimal improvement. She denies any fevers or chills. No nausea or vomiting. Does have some suprapubic pain as well as bilateral flank pain.  Current Outpatient Prescriptions on File Prior to Visit  Medication Sig Dispense Refill  . gabapentin (NEURONTIN) 400 MG capsule Take 400 mg by mouth 3 (three) times daily.      Marland Kitchen ibuprofen (ADVIL,MOTRIN) 800 MG tablet Take 800 mg by mouth every 8 (eight) hours as needed.      . methadone (DOLOPHINE) 10 MG/5ML solution Take 17.5 mLs (35 mg total) by mouth daily.      . naproxen (NAPROSYN) 500 MG tablet Take 1 tablet (500 mg total) by mouth 2 (two) times daily.  30 tablet  0  . phenazopyridine (PYRIDIUM) 95 MG tablet Take 95 mg by mouth 3 (three) times daily as needed for pain.      Marland Kitchen QUEtiapine (SEROQUEL) 300 MG tablet Take 300 mg by mouth at bedtime.       No current facility-administered medications on file prior to visit.    I have reviewed and updated the following as appropriate: allergies and current medications SHx: current smoker   Review of Systems See HPI    Objective:   Physical Exam BP 123/80  Pulse 67  Wt 153 lb (69.4 kg)  LMP 05/30/2014 Gen: alert, cooperative, NAD Abd: mild suprapubic tenderness Back: bilateral mild CVA pain     Assessment & Plan:

## 2014-06-02 NOTE — Assessment & Plan Note (Signed)
UA limited given that she is on Pyridium. However, consistent with infection. Will send urine for culture. Treat with Cipro 500 mg twice a day for 10 days given her flank pain. I have minimal suspicion for upper tract infection. I also discussed that she can continue the Pyridium. She can also take Tylenol and ibuprofen as needed. Discussed that I will not give her stronger pain medication for a bladder infection. Return precautions reviewed as in after visit summary. Followup if no improvement in one week.

## 2014-07-28 ENCOUNTER — Emergency Department (HOSPITAL_COMMUNITY)
Admission: EM | Admit: 2014-07-28 | Discharge: 2014-07-28 | Disposition: A | Discharge status: Home / Self Care | Payer: Medicare Other | Attending: Emergency Medicine | Admitting: Emergency Medicine

## 2014-07-28 ENCOUNTER — Telehealth: Payer: Self-pay | Admitting: *Deleted

## 2014-07-28 DIAGNOSIS — F17200 Nicotine dependence, unspecified, uncomplicated: Secondary | ICD-10-CM | POA: Insufficient documentation

## 2014-07-28 DIAGNOSIS — F319 Bipolar disorder, unspecified: Secondary | ICD-10-CM | POA: Insufficient documentation

## 2014-07-28 DIAGNOSIS — R51 Headache: Secondary | ICD-10-CM | POA: Insufficient documentation

## 2014-07-28 DIAGNOSIS — F411 Generalized anxiety disorder: Secondary | ICD-10-CM | POA: Insufficient documentation

## 2014-07-28 DIAGNOSIS — Z79899 Other long term (current) drug therapy: Secondary | ICD-10-CM | POA: Diagnosis not present

## 2014-07-28 DIAGNOSIS — R519 Headache, unspecified: Secondary | ICD-10-CM

## 2014-07-28 DIAGNOSIS — I10 Essential (primary) hypertension: Secondary | ICD-10-CM | POA: Diagnosis not present

## 2014-07-28 DIAGNOSIS — Z8619 Personal history of other infectious and parasitic diseases: Secondary | ICD-10-CM | POA: Diagnosis not present

## 2014-07-28 MED ORDER — METOCLOPRAMIDE HCL 5 MG/ML IJ SOLN
10.0000 mg | INTRAMUSCULAR | Status: AC
  Administered 2014-07-28: 10 mg via INTRAVENOUS
  Filled 2014-07-28: qty 2

## 2014-07-28 MED ORDER — SODIUM CHLORIDE 0.9 % IV BOLUS (SEPSIS)
1000.0000 mL | Freq: Once | INTRAVENOUS | Status: AC
  Administered 2014-07-28: 1000 mL via INTRAVENOUS

## 2014-07-28 MED ORDER — DIPHENHYDRAMINE HCL 50 MG/ML IJ SOLN
25.0000 mg | Freq: Once | INTRAMUSCULAR | Status: AC
  Administered 2014-07-28: 25 mg via INTRAVENOUS
  Filled 2014-07-28: qty 1

## 2014-07-28 MED ORDER — DEXAMETHASONE SODIUM PHOSPHATE 10 MG/ML IJ SOLN
10.0000 mg | Freq: Once | INTRAMUSCULAR | Status: AC
  Administered 2014-07-28: 10 mg via INTRAVENOUS
  Filled 2014-07-28: qty 1

## 2014-07-28 NOTE — Discharge Instructions (Signed)
Continue all home meds as directed. Follow-up with [facility]-- call and schedule appt. Return to the ED for new or worsening symptoms.

## 2014-07-28 NOTE — ED Notes (Signed)
Pt standing in room. Upset stating 'I can't take it! This medicine's not working!' Pt informed that medication had not had time to take full effect yet. Pt lying in bed at this time.

## 2014-07-28 NOTE — Telephone Encounter (Signed)
Pt called complaining of migraine headache x 3 days now and requesting an injection.  Pt advised she will need to schedule an appt with her provider to discuss any injections.  Pt stated she was prescribed blood pressure medication and stopped taking it.  Pt asked if she has taking her blood pressure; pt does not have a means to check blood pressure.  Pt informed she should consult with her PCP before stopping any medications.  All appt has been filled for today.  Pt advised she should go to urgent care if she did not want wait until tomorrow.  Derl Barrow, RN

## 2014-07-28 NOTE — ED Notes (Signed)
Bed: NM07 Expected date:  Expected time:  Means of arrival:  Comments: EMS-migraine

## 2014-07-28 NOTE — ED Notes (Addendum)
Pt states she has had a migraine x 3 days. Hx of same. Usually only last 1 day x 2 in a month. Nausea. Takes methadone. Alert and oriented.

## 2014-07-28 NOTE — ED Provider Notes (Signed)
CSN: [id number]     Arrival date & time 07/28/14  1541 History   First MD Initiated Contact with Patient 07/28/14 1612     Chief Complaint  Patient presents with  . Migraine     (Consider location/radiation/quality/duration/timing/severity/associated sxs/prior Treatment) The history is provided by the patient and medical records.   This is a 28 yo female presents today with complaints of migraine x 3 days.  Headache was initially located on the right side of pt's head but has moved to the left side today.  Pt describes this migraine as the same as her other migraine but lasting longer.  Pain is described as a constant, stabbing pain and is rated 10/10.  Aleve, ibuprofen, and sleep have been tried without relief.  Light, sound, and standing upright make headache worse but pt notes these are all typical of her migraines.  Pt denies any hx of recent trauma.  Pt endorses nausea and some left sided neck pain.  She did experience some diplopia yesterday but this has since resolved.  Pt denies fever, tinnitus, confusion, nausea, vomiting, diarrhea, changes in speech, or difficulty ambulating.  Patient is not current established with neurologist.  Past Medical History  Diagnosis Date  . Bipolar 1 disorder   . Opiate addiction   . Abnormal Pap smear   . Depression   . Polysubstance abuse   . Hypertension   . Anxiety   . Chlamydia 07/12/2012   Past Surgical History  Procedure Laterality Date  . Vaginal deliveries    . Tubal ligation  09/08/2011    Procedure: POST PARTUM TUBAL LIGATION;  Surgeon: Emeterio Reeve, MD;  Location: [facility];  Service: Gynecology;  Laterality: Bilateral;  . Cholecystectomy  11/04/2012    Procedure: LAPAROSCOPIC CHOLECYSTECTOMY WITH INTRAOPERATIVE CHOLANGIOGRAM;  Surgeon: Imogene Burn. Georgette Dover, MD;  Location: [facility];  Service: General;  Laterality: N/A;   Family History  Problem Relation Age of Onset  . Drug abuse Mother   . Drug abuse Father   . Diabetes Maternal  Grandmother   . Hypertension Maternal Grandmother   . Diabetes Paternal Grandmother   . Diabetes Paternal Grandfather    History  Substance Use Topics  . Smoking status: Current Every Day Smoker -- 1.00 packs/day    Types: Cigarettes  . Smokeless tobacco: Never Used  . Alcohol Use: No   OB History   Grav Para Term Preterm Abortions TAB SAB Ect Mult Living   7 6 3 3 1 1   1 6      Review of Systems  Neurological: Positive for headaches.  All other systems reviewed and are negative.     Allergies  Review of patient's allergies indicates no known allergies.  Home Medications   Prior to Admission medications   Medication Sig Start Date End Date Taking? Authorizing Provider  gabapentin (NEURONTIN) 600 MG tablet Take 600 mg by mouth 3 (three) times daily.   Yes Historical Provider, MD  ibuprofen (ADVIL,MOTRIN) 200 MG tablet Take 800 mg by mouth every 6 (six) hours as needed for headache or moderate pain.   Yes Historical Provider, MD  methadone (DOLOPHINE) 10 MG/5ML solution Take 17.5 mLs (35 mg total) by mouth daily. 03/31/13  Yes Vivi Ferns, MD  Multiple Vitamin (MULTIVITAMIN WITH MINERALS) TABS tablet Take 1 tablet by mouth daily.   Yes Historical Provider, MD  QUEtiapine (SEROQUEL) 300 MG tablet Take 300 mg by mouth at bedtime.   Yes Historical Provider, MD   BP 133/81  Pulse  65  Temp(Src) 98.5 F (36.9 C) (Oral)  Resp 18  SpO2 100%  LMP 07/24/2014  Physical Exam  Nursing note and vitals reviewed. Constitutional: She is oriented to person, place, and time. She appears well-developed and well-nourished. No distress.  HENT:  Head: Normocephalic and atraumatic.  Mouth/Throat: Oropharynx is clear and moist.  Eyes: Conjunctivae and EOM are normal. Pupils are equal, round, and reactive to light.  Neck: Normal range of motion and full passive range of motion without pain. Neck supple. Muscular tenderness present. No rigidity.    No meningeal signs; mild tenderness along  left trapezius; no midline tenderness, step-off, or deformities; full ROM of neck without pain  Cardiovascular: Normal rate, regular rhythm and normal heart sounds.   Pulmonary/Chest: Effort normal and breath sounds normal. No respiratory distress. She has no wheezes.  Abdominal: Soft. Bowel sounds are normal. There is no tenderness. There is no guarding.  Musculoskeletal: Normal range of motion.  Neurological: She is alert and oriented to person, place, and time.  AAOx3, answering questions appropriately; equal strength UE and LE bilaterally; CN grossly intact; moves all extremities appropriately without ataxia; no focal neuro deficits or facial asymmetry appreciated  Skin: Skin is warm and dry. She is not diaphoretic.  Psychiatric: She has a normal mood and affect.    ED Course  Procedures (including critical care time) Labs Review Labs Reviewed - No data to display  Imaging Review No results found.   EKG Interpretation None      MDM   Final diagnoses:  Headache, unspecified headache type   28 y.o. F with migraine headache, hx same.  Currently baseline oriented, neuro exam non-focal.  Currently afebrile and without nuchal rigidity to suggest meningitis.  Low suspicion for intracranial pathology, including, but not limited to, TIA, stroke, ICH, or SAH.  Pt treated with migraine cocktail of benadryl, reglan, decadron, and IVF bolus.    After meds headache has significantly improved.  Neuro exam remains non-focal.  Patient comfortable with discharge home at this time.  Recommend FU with neurology, referral provided.  Discussed plan with patient, he/she acknowledged understanding and agreed with plan of care.  Return precautions given for new or worsening symptoms.  Larene Pickett, PA-C 07/28/14 2255

## 2014-07-30 ENCOUNTER — Ambulatory Visit: Payer: Medicare Other | Admitting: Neurology

## 2014-08-01 NOTE — ED Provider Notes (Signed)
Medical screening examination/treatment/procedure(s) were performed by non-physician practitioner and as supervising physician I was immediately available for consultation/collaboration.   EKG Interpretation None        Debby Freiberg, MD 08/01/14 [phone]

## 2014-08-13 ENCOUNTER — Encounter: Payer: Self-pay | Admitting: Neurology

## 2014-09-10 ENCOUNTER — Encounter: Payer: Self-pay | Admitting: *Deleted

## 2014-10-08 ENCOUNTER — Telehealth: Payer: Self-pay | Admitting: Family Medicine

## 2014-10-08 NOTE — Telephone Encounter (Addendum)
[facility] After Hours Line  Rhonda Cabrera is a 28 y.o. female with history no significant history that calls with a complaint of sharp, intermittent pain in anterior left leg that radiates to foot. Symptom started 3-4 hours ago. Rubbing the leg did not help much. Has not taken any medication. Leg is not swollen or red. No problems moving her legs. No weakness. No rash. She is able to ambulate with some pain. No history of DVT.  Do not suspect DVT from history. Recommended to take tylenol or ibuprofen for pain. Patient can make an appointment in the morning for a same day appointment.

## 2014-10-19 ENCOUNTER — Encounter: Payer: Self-pay | Admitting: Emergency Medicine

## 2014-12-05 ENCOUNTER — Emergency Department (INDEPENDENT_AMBULATORY_CARE_PROVIDER_SITE_OTHER)
Admission: EM | Admit: 2014-12-05 | Discharge: 2014-12-05 | Disposition: A | Discharge status: Home / Self Care | Payer: Medicare Other | Source: Home / Self Care | Attending: Emergency Medicine | Admitting: Emergency Medicine

## 2014-12-05 ENCOUNTER — Encounter (HOSPITAL_COMMUNITY): Payer: Self-pay | Admitting: *Deleted

## 2014-12-05 DIAGNOSIS — N390 Urinary tract infection, site not specified: Secondary | ICD-10-CM

## 2014-12-05 LAB — POCT URINALYSIS DIP (DEVICE)
BILIRUBIN URINE: NEGATIVE
Glucose, UA: NEGATIVE mg/dL
Hgb urine dipstick: NEGATIVE
Ketones, ur: NEGATIVE mg/dL
Nitrite: POSITIVE — AB
PH: 5 (ref 5.0–8.0)
Protein, ur: NEGATIVE mg/dL
Urobilinogen, UA: 0.2 mg/dL (ref 0.0–1.0)

## 2014-12-05 LAB — POCT PREGNANCY, URINE: Preg Test, Ur: NEGATIVE

## 2014-12-05 MED ORDER — CEFUROXIME AXETIL 250 MG PO TABS: 250.0000 mg | ORAL_TABLET | Freq: Two times a day (BID) | ORAL | Status: DC

## 2014-12-05 MED ORDER — PHENAZOPYRIDINE HCL 200 MG PO TABS: 200.0000 mg | ORAL_TABLET | Freq: Three times a day (TID) | ORAL | Status: DC

## 2014-12-05 MED ORDER — FLUCONAZOLE 150 MG PO TABS: 150.0000 mg | ORAL_TABLET | Freq: Once | ORAL | Status: DC

## 2014-12-05 NOTE — Discharge Instructions (Signed)

## 2014-12-05 NOTE — ED Notes (Signed)
Pt  Reports  Symptoms  Of  Urinary  Frequency  Discomfort            With  Tingling  Sensation  When she  Voids    Onsetof  Symptoms  This  Am  - pt states  This  Feels like  A  uti   Which  She  Reports  She  Has had in past

## 2014-12-05 NOTE — ED Provider Notes (Signed)
CSN: [id number]     Arrival date & time 12/05/14  1817 History   First MD Initiated Contact with Patient 12/05/14 1819     Chief Complaint  Patient presents with  . Urinary Tract Infection   (Consider location/radiation/quality/duration/timing/severity/associated sxs/prior Treatment) HPI          28 year old female presents complaining of possible urinary tract infection. She has urinary frequency, urgency, dysuria. This began yesterday. Her symptoms are mild to moderate. She denies any vaginal discharge or risk for STDs. No fever, chills, NVD, flank pain, or vaginal discharge. She has a history of UTIs that have felt exactly like she feels today.    Past Medical History  Diagnosis Date  . Bipolar 1 disorder   . Opiate addiction   . Abnormal Pap smear   . Depression   . Polysubstance abuse   . Hypertension   . Anxiety   . Chlamydia 07/12/2012   Past Surgical History  Procedure Laterality Date  . Vaginal deliveries    . Tubal ligation  09/08/2011    Procedure: POST PARTUM TUBAL LIGATION;  Surgeon: Emeterio Reeve, MD;  Location: [facility];  Service: Gynecology;  Laterality: Bilateral;  . Cholecystectomy  11/04/2012    Procedure: LAPAROSCOPIC CHOLECYSTECTOMY WITH INTRAOPERATIVE CHOLANGIOGRAM;  Surgeon: Imogene Burn. Georgette Dover, MD;  Location: [facility];  Service: General;  Laterality: N/A;   Family History  Problem Relation Age of Onset  . Drug abuse Mother   . Drug abuse Father   . Diabetes Maternal Grandmother   . Hypertension Maternal Grandmother   . Diabetes Paternal Grandmother   . Diabetes Paternal Grandfather    History  Substance Use Topics  . Smoking status: Current Every Day Smoker -- 1.00 packs/day    Types: Cigarettes  . Smokeless tobacco: Never Used  . Alcohol Use: No   OB History    Gravida Para Term Preterm AB TAB SAB Ectopic Multiple Living   7 6 3 3 1 1   1 6      Review of Systems  Constitutional: Negative for fever and chills.  Gastrointestinal: Negative for  nausea, vomiting, abdominal pain and diarrhea.  Genitourinary: Positive for dysuria, urgency and frequency. Negative for hematuria, flank pain, vaginal bleeding, vaginal discharge, genital sores and vaginal pain.  All other systems reviewed and are negative.   Allergies  Review of patient's allergies indicates no known allergies.  Home Medications   Prior to Admission medications   Medication Sig Start Date End Date Taking? Authorizing Provider  cefUROXime (CEFTIN) 250 MG tablet Take 1 tablet (250 mg total) by mouth 2 (two) times daily with a meal. 12/05/14   Liam Graham, PA-C  fluconazole (DIFLUCAN) 150 MG tablet Take 1 tablet (150 mg total) by mouth once. Pick up the refill and and take second dose in 5 days if symptoms have not resolved 12/05/14   Liam Graham, PA-C  gabapentin (NEURONTIN) 600 MG tablet Take 600 mg by mouth 3 (three) times daily.    Historical Provider, MD  ibuprofen (ADVIL,MOTRIN) 200 MG tablet Take 800 mg by mouth every 6 (six) hours as needed for headache or moderate pain.    Historical Provider, MD  methadone (DOLOPHINE) 10 MG/5ML solution Take 17.5 mLs (35 mg total) by mouth daily. 03/31/13   Vivi Ferns, MD  Multiple Vitamin (MULTIVITAMIN WITH MINERALS) TABS tablet Take 1 tablet by mouth daily.    Historical Provider, MD  phenazopyridine (PYRIDIUM) 200 MG tablet Take 1 tablet (200 mg total) by mouth  3 (three) times daily. 12/05/14   Freeman Caldron Natisha Trzcinski, PA-C  QUEtiapine (SEROQUEL) 300 MG tablet Take 300 mg by mouth at bedtime.    Historical Provider, MD   BP 121/91 mmHg  Pulse 78  Temp(Src) 98.2 F (36.8 C) (Oral)  Resp 16  SpO2 100%  LMP 11/05/2014 Physical Exam  Constitutional: She is oriented to person, place, and time. Vital signs are normal. She appears well-developed and well-nourished. No distress.  HENT:  Head: Normocephalic and atraumatic.  Pulmonary/Chest: Effort normal. No respiratory distress.  Abdominal: Soft. She exhibits no distension and  no mass. There is no tenderness. There is no rebound, no guarding and no CVA tenderness.  Neurological: She is alert and oriented to person, place, and time. She has normal strength. Coordination normal.  Skin: Skin is warm and dry. No rash noted. She is not diaphoretic.  Psychiatric: She has a normal mood and affect. Judgment normal.  Nursing note and vitals reviewed.   ED Course  Procedures (including critical care time) Labs Review Labs Reviewed  POCT URINALYSIS DIP (DEVICE) - Abnormal; Notable for the following:    Nitrite POSITIVE (*)    Leukocytes, UA MODERATE (*)    All other components within normal limits  URINE CULTURE  POCT PREGNANCY, URINE    Imaging Review No results found.   MDM   1. UTI (lower urinary tract infection)    Urinalysis and symptoms are consistent with a simple lower urinary tract infection. Treat with Ceftin and Pyridium. Also antibiotics always give her a yeast infection so will prescribe Diflucan as well. Follow-up when necessary   Meds ordered this encounter  Medications  . cefUROXime (CEFTIN) 250 MG tablet    Sig: Take 1 tablet (250 mg total) by mouth 2 (two) times daily with a meal.    Dispense:  10 tablet    Refill:  0  . phenazopyridine (PYRIDIUM) 200 MG tablet    Sig: Take 1 tablet (200 mg total) by mouth 3 (three) times daily.    Dispense:  6 tablet    Refill:  0  . fluconazole (DIFLUCAN) 150 MG tablet    Sig: Take 1 tablet (150 mg total) by mouth once. Pick up the refill and and take second dose in 5 days if symptoms have not resolved    Dispense:  1 tablet    Refill:  O'Brien Jovian Lembcke, PA-C 12/05/14 1845

## 2014-12-08 LAB — URINE CULTURE

## 2014-12-08 NOTE — ED Notes (Signed)
Urine culture: >100,000 colonies E. Coli.  Pt. adequately treated with Ceftin.   Roselyn Meier 12/08/2014

## 2015-01-13 ENCOUNTER — Encounter (HOSPITAL_COMMUNITY): Payer: Self-pay

## 2015-01-13 DIAGNOSIS — Z202 Contact with and (suspected) exposure to infections with a predominantly sexual mode of transmission: Secondary | ICD-10-CM | POA: Diagnosis not present

## 2015-01-13 DIAGNOSIS — Z114 Encounter for screening for human immunodeficiency virus [HIV]: Secondary | ICD-10-CM | POA: Insufficient documentation

## 2015-01-13 DIAGNOSIS — I10 Essential (primary) hypertension: Secondary | ICD-10-CM | POA: Diagnosis not present

## 2015-01-13 DIAGNOSIS — F319 Bipolar disorder, unspecified: Secondary | ICD-10-CM | POA: Diagnosis not present

## 2015-01-13 DIAGNOSIS — N898 Other specified noninflammatory disorders of vagina: Secondary | ICD-10-CM | POA: Diagnosis not present

## 2015-01-13 DIAGNOSIS — Z720 Tobacco use: Secondary | ICD-10-CM | POA: Diagnosis not present

## 2015-01-13 DIAGNOSIS — F419 Anxiety disorder, unspecified: Secondary | ICD-10-CM | POA: Insufficient documentation

## 2015-01-13 DIAGNOSIS — Z79899 Other long term (current) drug therapy: Secondary | ICD-10-CM | POA: Insufficient documentation

## 2015-01-13 DIAGNOSIS — Z8619 Personal history of other infectious and parasitic diseases: Secondary | ICD-10-CM | POA: Diagnosis not present

## 2015-01-13 NOTE — ED Notes (Signed)
Pt from home requesting to be checked for STDs.  Sts she had unprotected sex.  Requesting pelvic exam and blood work.  Denies any discharge.

## 2015-01-14 ENCOUNTER — Emergency Department (HOSPITAL_COMMUNITY)
Admission: EM | Admit: 2015-01-14 | Discharge: 2015-01-14 | Disposition: A | Discharge status: Home / Self Care | Payer: Medicare Other | Attending: Emergency Medicine | Admitting: Emergency Medicine

## 2015-01-14 DIAGNOSIS — Z202 Contact with and (suspected) exposure to infections with a predominantly sexual mode of transmission: Secondary | ICD-10-CM | POA: Diagnosis not present

## 2015-01-14 DIAGNOSIS — Z711 Person with feared health complaint in whom no diagnosis is made: Secondary | ICD-10-CM

## 2015-01-14 LAB — WET PREP, GENITAL
Trich, Wet Prep: NONE SEEN
Yeast Wet Prep HPF POC: NONE SEEN

## 2015-01-14 LAB — GC/CHLAMYDIA PROBE AMP (~~LOC~~) NOT AT ARMC
Chlamydia: NEGATIVE
Neisseria Gonorrhea: NEGATIVE

## 2015-01-14 NOTE — Discharge Instructions (Signed)
1. Medications: usual home medications 2. Treatment: rest, drink plenty of fluids,  3. Follow Up: Please followup with your primary doctor or OB/GYN in 7 days for discussion of your diagnoses and further evaluation after today's visit; if you do not have a primary care doctor use the resource guide provided to find one; Please return to the ER for abd pain, fevers, persistent vomiting or other concerning symptoms

## 2015-01-14 NOTE — ED Provider Notes (Signed)
CSN: [id number]     Arrival date & time 01/13/15  2131 History   First MD Initiated Contact with Patient 01/14/15 0058     Chief Complaint  Patient presents with  . Exposure to STD     (Consider location/radiation/quality/duration/timing/severity/associated sxs/prior Treatment) Patient is a 29 y.o. female presenting with STD exposure. The history is provided by the patient and medical records. No language interpreter was used.  Exposure to STD Pertinent negatives include no abdominal pain, chest pain, coughing, diaphoresis, fatigue, fever, headaches, nausea, rash or vomiting.    Rhonda Cabrera is a 29 y.o. female  with a hx of bipolar disorder, and put addiction, polysubstance abuse, hypertension, anxiety, chlamydia, depression presents to the Emergency Department requesting an STD check. Patient reports that she has a new female partner in the last 2 weeks and there have been 2 episodes of unprotected sexual intercourse including today. She reports that she has a BTL and finished her menstrual cycle yesterday.  She reports that she was forced to come to the ER so that her spouse would not know.  She denies fever, chills, headache, neck pain, chest pain, shortness of breath, abdominal pain, nausea, vomiting, diarrhea, weakness, dizziness, syncope, dysuria, hematuria, vaginal discharge, vaginal pain.  Past Medical History  Diagnosis Date  . Bipolar 1 disorder   . Opiate addiction   . Abnormal Pap smear   . Depression   . Polysubstance abuse   . Hypertension   . Anxiety   . Chlamydia 07/12/2012   Past Surgical History  Procedure Laterality Date  . Vaginal deliveries    . Tubal ligation  09/08/2011    Procedure: POST PARTUM TUBAL LIGATION;  Surgeon: Emeterio Reeve, MD;  Location: [facility];  Service: Gynecology;  Laterality: Bilateral;  . Cholecystectomy  11/04/2012    Procedure: LAPAROSCOPIC CHOLECYSTECTOMY WITH INTRAOPERATIVE CHOLANGIOGRAM;  Surgeon: Imogene Burn. Georgette Dover, MD;  Location: [facility];  Service: General;  Laterality: N/A;   Family History  Problem Relation Age of Onset  . Drug abuse Mother   . Drug abuse Father   . Diabetes Maternal Grandmother   . Hypertension Maternal Grandmother   . Diabetes Paternal Grandmother   . Diabetes Paternal Grandfather    History  Substance Use Topics  . Smoking status: Current Every Day Smoker -- 1.00 packs/day    Types: Cigarettes  . Smokeless tobacco: Never Used  . Alcohol Use: No   OB History    Gravida Para Term Preterm AB TAB SAB Ectopic Multiple Living   7 6 3 3 1 1   1 6      Review of Systems  Constitutional: Negative for fever, diaphoresis, appetite change, fatigue and unexpected weight change.  HENT: Negative for mouth sores.   Eyes: Negative for visual disturbance.  Respiratory: Negative for cough, chest tightness, shortness of breath and wheezing.   Cardiovascular: Negative for chest pain.  Gastrointestinal: Negative for nausea, vomiting, abdominal pain, diarrhea and constipation.  Endocrine: Negative for polydipsia, polyphagia and polyuria.  Genitourinary: Negative for dysuria, urgency, frequency and hematuria.  Musculoskeletal: Negative for back pain and neck stiffness.  Skin: Negative for rash.  Allergic/Immunologic: Negative for immunocompromised state.  Neurological: Negative for syncope, light-headedness and headaches.  Hematological: Does not bruise/bleed easily.  Psychiatric/Behavioral: Negative for sleep disturbance. The patient is not nervous/anxious.       Allergies  Review of patient's allergies indicates no known allergies.  Home Medications   Prior to Admission medications   Medication Sig Start Date End  Date Taking? Authorizing Provider  gabapentin (NEURONTIN) 600 MG tablet Take 600 mg by mouth 3 (three) times daily.   Yes Historical Provider, MD  ibuprofen (ADVIL,MOTRIN) 200 MG tablet Take 800 mg by mouth every 6 (six) hours as needed for headache or moderate pain.   Yes Historical  Provider, MD  Multiple Vitamin (MULTIVITAMIN WITH MINERALS) TABS tablet Take 1 tablet by mouth daily.   Yes Historical Provider, MD  QUEtiapine (SEROQUEL) 300 MG tablet Take 300 mg by mouth at bedtime.   Yes Historical Provider, MD  cefUROXime (CEFTIN) 250 MG tablet Take 1 tablet (250 mg total) by mouth 2 (two) times daily with a meal. Patient not taking: Reported on 01/13/2015 12/05/14   Liam Graham, PA-C  fluconazole (DIFLUCAN) 150 MG tablet Take 1 tablet (150 mg total) by mouth once. Pick up the refill and and take second dose in 5 days if symptoms have not resolved Patient not taking: Reported on 01/13/2015 12/05/14   Liam Graham, PA-C  methadone (DOLOPHINE) 10 MG/5ML solution Take 17.5 mLs (35 mg total) by mouth daily. Patient not taking: Reported on 01/13/2015 03/31/13   Vivi Ferns, MD  phenazopyridine (PYRIDIUM) 200 MG tablet Take 1 tablet (200 mg total) by mouth 3 (three) times daily. Patient not taking: Reported on 01/13/2015 12/05/14   Liam Graham, PA-C   BP 114/56 mmHg  Pulse 63  Temp(Src) 98.2 F (36.8 C) (Oral)  Resp 16  SpO2 100%  LMP 01/06/2015 Physical Exam  Constitutional: She appears well-developed and well-nourished. No distress.  HENT:  Head: Normocephalic and atraumatic.  Eyes: Conjunctivae are normal.  Neck: Normal range of motion.  Cardiovascular: Normal rate, regular rhythm, normal heart sounds and intact distal pulses.   No murmur heard. Pulmonary/Chest: Effort normal and breath sounds normal. No respiratory distress. She has no wheezes.  Abdominal: Soft. Bowel sounds are normal. There is no tenderness. There is no rebound and no guarding. Hernia confirmed negative in the right inguinal area and confirmed negative in the left inguinal area.  Genitourinary: Uterus normal. No labial fusion. There is no rash, tenderness or lesion on the right labia. There is no rash, tenderness or lesion on the left labia. Uterus is not deviated, not enlarged, not fixed and  not tender. Cervix exhibits no motion tenderness, no discharge and no friability. Right adnexum displays no mass, no tenderness and no fullness. Left adnexum displays no mass, no tenderness and no fullness. No erythema, tenderness or bleeding in the vagina. No foreign body around the vagina. No signs of injury around the vagina. Vaginal discharge ( Small amount of brown vaginal discharge in the vault) found.  Musculoskeletal: Normal range of motion. She exhibits no edema.  Lymphadenopathy:       Right: No inguinal adenopathy present.       Left: No inguinal adenopathy present.  Neurological: She is alert.  Skin: Skin is warm and dry. She is not diaphoretic. No erythema.  Psychiatric: She has a normal mood and affect.  Nursing note and vitals reviewed.   ED Course  Procedures (including critical care time) Labs Review Labs Reviewed  WET PREP, GENITAL - Abnormal; Notable for the following:    Clue Cells Wet Prep HPF POC FEW (*)    WBC, Wet Prep HPF POC FEW (*)    All other components within normal limits  RPR  HIV ANTIBODY (ROUTINE TESTING)  GC/CHLAMYDIA PROBE AMP ([place])    Imaging Review No results found.   EKG  Interpretation None      MDM   Final diagnoses:  Concern about STD in female without diagnosis   VANETTA RULE resents for seizure activity after unprotected sexual encounter.  Vaginal exam with small amount of brown vaginal discharge in the vault but no cervical motion or adnexal tenderness.  1:49 AM Wet prep with a few clue cells and a few white blood cells. I had a long discussion with the patient about her risk of gonorrhea and/or chlamydia with her recent sexually counters. She declines treatment tonight and wishes to wait for the cultures. I discussed that she refrain from sexual activity until she has the culture reports and if they return positive she will need treatment along with all of her partners.  I have personally reviewed patient's vitals,  nursing note and any pertinent labs or imaging.  I performed an focused physical exam; undressed when appropriate .    It has been determined that no acute conditions requiring further emergency intervention are present at this time. The patient/guardian have been advised of the diagnosis and plan. I reviewed any labs and imaging including any potential incidental findings. We have discussed signs and symptoms that warrant return to the ED and they are listed in the discharge instructions.    Vital signs are stable at discharge.   BP 114/56 mmHg  Pulse 63  Temp(Src) 98.2 F (36.8 C) (Oral)  Resp 16  SpO2 100%  LMP 01/06/2015        Jarrett Soho Othmar Ringer, PA-C 01/14/15 0149  Varney Biles, MD 01/14/15 [id number]

## 2015-01-14 NOTE — ED Notes (Signed)
Pt requesting STD testing; reports unprotected sex with new partner last week. Denies GU symptoms or discharge

## 2015-01-15 LAB — HIV ANTIBODY (ROUTINE TESTING W REFLEX): HIV Screen 4th Generation wRfx: NONREACTIVE

## 2015-01-15 LAB — RPR: RPR Ser Ql: NONREACTIVE

## 2015-02-05 ENCOUNTER — Encounter: Payer: Self-pay | Admitting: Family Medicine

## 2015-02-05 ENCOUNTER — Ambulatory Visit (INDEPENDENT_AMBULATORY_CARE_PROVIDER_SITE_OTHER): Payer: Medicare Other | Admitting: Family Medicine

## 2015-02-05 VITALS — BP 129/85 | HR 74 | Temp 98.6°F | Ht 67.0 in | Wt 158.7 lb

## 2015-02-05 DIAGNOSIS — B351 Tinea unguium: Secondary | ICD-10-CM

## 2015-02-05 DIAGNOSIS — G43819 Other migraine, intractable, without status migrainosus: Secondary | ICD-10-CM | POA: Diagnosis not present

## 2015-02-05 DIAGNOSIS — IMO0002 Reserved for concepts with insufficient information to code with codable children: Secondary | ICD-10-CM

## 2015-02-05 DIAGNOSIS — G43709 Chronic migraine without aura, not intractable, without status migrainosus: Secondary | ICD-10-CM

## 2015-02-05 NOTE — Patient Instructions (Signed)
Thank you so much for coming to visit me today!  The medication for your toes is very expensive! Please schedule a visit with your primary physician to discuss other options.  Your blood pressure looks great today.  Please return for your pap smear.  I have placed a referral to neurology so we can further evaluate your migraines.  Thanks again! It was a pleasure talking to you! Dr. Gerlean Ren

## 2015-02-08 ENCOUNTER — Emergency Department (HOSPITAL_COMMUNITY)
Admission: EM | Admit: 2015-02-08 | Discharge: 2015-02-09 | Disposition: A | Discharge status: Home / Self Care | Payer: Medicare Other | Attending: Emergency Medicine | Admitting: Emergency Medicine

## 2015-02-08 ENCOUNTER — Encounter (HOSPITAL_COMMUNITY): Payer: Self-pay

## 2015-02-08 DIAGNOSIS — I10 Essential (primary) hypertension: Secondary | ICD-10-CM | POA: Diagnosis not present

## 2015-02-08 DIAGNOSIS — G43809 Other migraine, not intractable, without status migrainosus: Secondary | ICD-10-CM | POA: Diagnosis not present

## 2015-02-08 DIAGNOSIS — F319 Bipolar disorder, unspecified: Secondary | ICD-10-CM | POA: Insufficient documentation

## 2015-02-08 DIAGNOSIS — Z79899 Other long term (current) drug therapy: Secondary | ICD-10-CM | POA: Insufficient documentation

## 2015-02-08 DIAGNOSIS — G43909 Migraine, unspecified, not intractable, without status migrainosus: Secondary | ICD-10-CM | POA: Diagnosis present

## 2015-02-08 DIAGNOSIS — Z720 Tobacco use: Secondary | ICD-10-CM | POA: Diagnosis not present

## 2015-02-08 DIAGNOSIS — B351 Tinea unguium: Secondary | ICD-10-CM | POA: Insufficient documentation

## 2015-02-08 DIAGNOSIS — F419 Anxiety disorder, unspecified: Secondary | ICD-10-CM | POA: Diagnosis not present

## 2015-02-08 NOTE — ED Notes (Signed)
Pt complains of a migraine that started earlier tonight, she tried to take aleve and rest, no relief. She states that she usually gets relief through IV medications.

## 2015-02-08 NOTE — Assessment & Plan Note (Signed)
-   Referral to neurology for chronic migraines

## 2015-02-08 NOTE — Progress Notes (Signed)
Subjective:     Patient ID: Rhonda Cabrera, female   DOB: 1986/05/08, 29 y.o.   MRN: [id number]  HPI 29yo female presenting today for annual exam. - Complains of thickening and discoloration of toenail of first digit bilaterally. Goes for pedicures frequently and was told at last visit she has a toenail fungus.  - Denies any other complaint today. Denies pain, chest pain, shortness of breath. - History of migraines, which usually result in her presenting to ED for IV medications. Occurs a few times a month. States she was referred to neurology in the past but she has been unable to call them. Would like another referral placed today.  - Goes to methadone clinic daily and takes 35mg /day. Reports history of opioid abuse but states she has been doing well since presenting to the Meth[facility]one Clinic. - Last pap smear 2012  Review of Systems  All other systems reviewed and are negative.      Objective:   Physical Exam  Constitutional: She is oriented to person, place, and time. She appears well-developed and well-nourished. No distress.  Eyes: Pupils are equal, round, and reactive to light.  Pupils normal in size  Cardiovascular: Normal rate.  Exam reveals no gallop and no friction rub.   No murmur heard. Pulmonary/Chest: Effort normal. No respiratory distress. She has no wheezes. She has no rales.  Abdominal: Soft. Bowel sounds are normal. She exhibits no distension. There is no tenderness.  Musculoskeletal: She exhibits no edema.  Neurological: She is alert and oriented to person, place, and time.  Skin: Skin is warm. No rash noted.  Thickening of toenails of first digit bilaterally. Discoloration not noted due to polish  Psychiatric: She has a normal mood and affect. Her behavior is normal.      Assessment:     Please refer to Problem List for Assessment    Plan:     Please refer to Problem List for Plan  Will return for Pap Smear

## 2015-02-08 NOTE — Assessment & Plan Note (Signed)
-   Return without polish so toenails can be fully evaluated - Discussed treatment options, included oral medications, local medication, and removal of toenail. Interested in medications that will work through [facility]. - Will discuss with PCP

## 2015-02-09 DIAGNOSIS — G43809 Other migraine, not intractable, without status migrainosus: Secondary | ICD-10-CM | POA: Diagnosis not present

## 2015-02-09 MED ORDER — DIPHENHYDRAMINE HCL 50 MG/ML IJ SOLN
12.5000 mg | Freq: Once | INTRAMUSCULAR | Status: AC
  Administered 2015-02-09: 12.5 mg via INTRAVENOUS
  Filled 2015-02-09: qty 1

## 2015-02-09 MED ORDER — METOCLOPRAMIDE HCL 5 MG/ML IJ SOLN
10.0000 mg | Freq: Once | INTRAMUSCULAR | Status: AC
  Administered 2015-02-09: 10 mg via INTRAVENOUS
  Filled 2015-02-09: qty 2

## 2015-02-09 MED ORDER — DEXAMETHASONE SODIUM PHOSPHATE 10 MG/ML IJ SOLN
10.0000 mg | Freq: Once | INTRAMUSCULAR | Status: AC
  Administered 2015-02-09: 10 mg via INTRAVENOUS
  Filled 2015-02-09: qty 1

## 2015-02-09 NOTE — ED Provider Notes (Signed)
CSN: [id number]     Arrival date & time 02/08/15  2222 History   First MD Initiated Contact with Patient 02/09/15 0034     Chief Complaint  Patient presents with  . Migraine     (Consider location/radiation/quality/duration/timing/severity/associated sxs/prior Treatment) Patient is a 29 y.o. female presenting with migraines. The history is provided by the patient. No language interpreter was used.  Migraine This is a new problem. Associated symptoms include headaches and nausea. Pertinent negatives include no chills, fever or vomiting. Associated symptoms comments: Migraine headache since yesterday similar to previous symptoms of migraine. She has a scheduled appointment with neurology but reports her pain required immediate care. She has had nausea and photophobia..    Past Medical History  Diagnosis Date  . Bipolar 1 disorder   . Opiate addiction   . Abnormal Pap smear   . Depression   . Polysubstance abuse   . Hypertension   . Anxiety   . Chlamydia 07/12/2012   Past Surgical History  Procedure Laterality Date  . Vaginal deliveries    . Tubal ligation  09/08/2011    Procedure: POST PARTUM TUBAL LIGATION;  Surgeon: Emeterio Reeve, MD;  Location: [facility];  Service: Gynecology;  Laterality: Bilateral;  . Cholecystectomy  11/04/2012    Procedure: LAPAROSCOPIC CHOLECYSTECTOMY WITH INTRAOPERATIVE CHOLANGIOGRAM;  Surgeon: Imogene Burn. Georgette Dover, MD;  Location: [facility];  Service: General;  Laterality: N/A;   Family History  Problem Relation Age of Onset  . Drug abuse Mother   . Drug abuse Father   . Diabetes Maternal Grandmother   . Hypertension Maternal Grandmother   . Diabetes Paternal Grandmother   . Diabetes Paternal Grandfather    History  Substance Use Topics  . Smoking status: Current Every Day Smoker -- 1.00 packs/day    Types: Cigarettes  . Smokeless tobacco: Never Used  . Alcohol Use: No   OB History    Gravida Para Term Preterm AB TAB SAB Ectopic Multiple Living   7 6 3 3  1 1   1 6      Review of Systems  Constitutional: Negative for fever and chills.  Eyes: Positive for photophobia.  Respiratory: Negative.   Cardiovascular: Negative.   Gastrointestinal: Positive for nausea. Negative for vomiting.  Musculoskeletal: Negative.   Skin: Negative.   Neurological: Positive for headaches.      Allergies  Review of patient's allergies indicates no known allergies.  Home Medications   Prior to Admission medications   Medication Sig Start Date End Date Taking? Authorizing Provider  gabapentin (NEURONTIN) 600 MG tablet Take 600 mg by mouth 3 (three) times daily.   Yes Historical Provider, MD  ibuprofen (ADVIL,MOTRIN) 200 MG tablet Take 800 mg by mouth every 6 (six) hours as needed for headache or moderate pain.   Yes Historical Provider, MD  methadone (DOLOPHINE) 10 MG/5ML solution Take 17.5 mLs (35 mg total) by mouth daily. Patient taking differently: Take 48 mg by mouth daily.  03/31/13  Yes Vivi Ferns, MD  Multiple Vitamin (MULTIVITAMIN WITH MINERALS) TABS tablet Take 1 tablet by mouth daily.   Yes Historical Provider, MD  naproxen sodium (ANAPROX) 220 MG tablet Take 440 mg by mouth 2 (two) times daily as needed (for headaches.).   Yes Historical Provider, MD  QUEtiapine (SEROQUEL) 300 MG tablet Take 300 mg by mouth at bedtime.   Yes Historical Provider, MD  cefUROXime (CEFTIN) 250 MG tablet Take 1 tablet (250 mg total) by mouth 2 (two) times daily with a  meal. Patient not taking: Reported on 01/13/2015 12/05/14   Liam Graham, PA-C  fluconazole (DIFLUCAN) 150 MG tablet Take 1 tablet (150 mg total) by mouth once. Pick up the refill and and take second dose in 5 days if symptoms have not resolved Patient not taking: Reported on 01/13/2015 12/05/14   Liam Graham, PA-C  phenazopyridine (PYRIDIUM) 200 MG tablet Take 1 tablet (200 mg total) by mouth 3 (three) times daily. Patient not taking: Reported on 01/13/2015 12/05/14   Liam Graham, PA-C   BP  119/74 mmHg  Pulse 51  Temp(Src) 97.9 F (36.6 C) (Oral)  Resp 18  SpO2 100%  LMP 02/05/2015 Physical Exam  Constitutional: She is oriented to person, place, and time. She appears well-developed and well-nourished.  HENT:  Head: Normocephalic.  Eyes: Pupils are equal, round, and reactive to light.  Neck: Normal range of motion. Neck supple.  Cardiovascular: Normal rate and regular rhythm.   Pulmonary/Chest: Effort normal and breath sounds normal.  Abdominal: Soft. Bowel sounds are normal. There is no tenderness. There is no rebound and no guarding.  Musculoskeletal: Normal range of motion.  Neurological: She is alert and oriented to person, place, and time. She has normal strength and normal reflexes. No sensory deficit. She displays a negative Romberg sign. Coordination normal.  CN's 3-12 grossly intact. Ambulatory without imbalance. Speech clear and focused.   Skin: Skin is warm and dry. No rash noted.  Psychiatric: She has a normal mood and affect.    ED Course  Procedures (including critical care time) Labs Review Labs Reviewed - No data to display  Imaging Review No results found.   EKG Interpretation None      MDM   Final diagnoses:  Other migraine without status migrainosus, not intractable    She reports headache is resolved with headache cocktail of medications. VSS. Normal neurologic exam. She is felt appropriate for discharge home.     Dewaine Oats, PA-C 02/09/15 [place], MD 02/09/15 [phone]

## 2015-02-09 NOTE — ED Notes (Signed)
Patient resting in bed, lights dimmed. No needs voiced at this time.

## 2015-02-09 NOTE — Discharge Instructions (Signed)

## 2015-02-09 NOTE — ED Notes (Signed)
Patient verbalizes understanding of discharge instructions, home care, and follow up care. Patient ambulatory out of department at this time.

## 2015-02-19 ENCOUNTER — Ambulatory Visit: Payer: Medicare Other | Admitting: Family Medicine

## 2015-03-02 ENCOUNTER — Telehealth: Payer: Self-pay | Admitting: Family Medicine

## 2015-03-02 ENCOUNTER — Other Ambulatory Visit (HOSPITAL_COMMUNITY)
Admission: RE | Admit: 2015-03-02 | Discharge: 2015-03-02 | Disposition: A | Discharge status: Home / Self Care | Payer: Medicare Other | Source: Ambulatory Visit | Attending: Family Medicine | Admitting: Family Medicine

## 2015-03-02 ENCOUNTER — Encounter: Payer: Self-pay | Admitting: Family Medicine

## 2015-03-02 ENCOUNTER — Ambulatory Visit (INDEPENDENT_AMBULATORY_CARE_PROVIDER_SITE_OTHER): Payer: Medicare Other | Admitting: Family Medicine

## 2015-03-02 VITALS — BP 120/80 | HR 90 | Temp 98.3°F | Ht 67.5 in | Wt 160.0 lb

## 2015-03-02 DIAGNOSIS — Z1151 Encounter for screening for human papillomavirus (HPV): Secondary | ICD-10-CM | POA: Insufficient documentation

## 2015-03-02 DIAGNOSIS — Z0000 Encounter for general adult medical examination without abnormal findings: Secondary | ICD-10-CM | POA: Diagnosis not present

## 2015-03-02 DIAGNOSIS — N760 Acute vaginitis: Secondary | ICD-10-CM

## 2015-03-02 DIAGNOSIS — R87619 Unspecified abnormal cytological findings in specimens from cervix uteri: Secondary | ICD-10-CM | POA: Insufficient documentation

## 2015-03-02 DIAGNOSIS — Z20828 Contact with and (suspected) exposure to other viral communicable diseases: Secondary | ICD-10-CM

## 2015-03-02 DIAGNOSIS — G43819 Other migraine, intractable, without status migrainosus: Secondary | ICD-10-CM | POA: Diagnosis not present

## 2015-03-02 DIAGNOSIS — R87810 Cervical high risk human papillomavirus (HPV) DNA test positive: Secondary | ICD-10-CM | POA: Insufficient documentation

## 2015-03-02 DIAGNOSIS — Z113 Encounter for screening for infections with a predominantly sexual mode of transmission: Secondary | ICD-10-CM | POA: Insufficient documentation

## 2015-03-02 DIAGNOSIS — Z124 Encounter for screening for malignant neoplasm of cervix: Secondary | ICD-10-CM | POA: Insufficient documentation

## 2015-03-02 DIAGNOSIS — B351 Tinea unguium: Secondary | ICD-10-CM | POA: Diagnosis present

## 2015-03-02 DIAGNOSIS — Z01419 Encounter for gynecological examination (general) (routine) without abnormal findings: Secondary | ICD-10-CM

## 2015-03-02 LAB — POCT WET PREP (WET MOUNT): Clue Cells Wet Prep Whiff POC: NEGATIVE

## 2015-03-02 LAB — RPR

## 2015-03-02 NOTE — Telephone Encounter (Signed)
Pt called and wanted to know if her test results are back and if so what were the results. jw

## 2015-03-02 NOTE — Assessment & Plan Note (Signed)
Check PAP today along with HIV/RPR/GC/Chlamyida - Previously checked for HIV in January 2016 - Counseled on smoking and alcohol use

## 2015-03-02 NOTE — Addendum Note (Signed)
Addended by: Charlton Haws on: 03/02/2015 10:44 AM   Modules accepted: Orders

## 2015-03-02 NOTE — Progress Notes (Signed)
Rhonda Cabrera is a 29 y.o. who presents today for general physical.  Preventative Medicine - Due for PAP today and would like to be checked for STD's.  Otherwise no risk factors for hyperlipidemia or hyperglycemia at this time.  Denies risky sex practices or IVDA.  Does smoke 1 ppd for the past 10-15 years and occasional alcohol use.    Past Medical History  Diagnosis Date  . Bipolar 1 disorder   . Opiate addiction   . Abnormal Pap smear   . Depression   . Polysubstance abuse   . Hypertension   . Anxiety   . Chlamydia 07/12/2012    History   Social History  . Marital Status: Married    Spouse Name: N/A  . Number of Children: 6  . Years of Education: N/A   Occupational History  . UNEMPLOYED    Social History Main Topics  . Smoking status: Current Every Day Smoker -- 1.00 packs/day    Types: Cigarettes  . Smokeless tobacco: Never Used  . Alcohol Use: No  . Drug Use: No  . Sexual Activity: Yes    Birth Control/ Protection: Surgical   Other Topics Concern  . Not on file   Social History Narrative   Daily caffeine     Family History  Problem Relation Age of Onset  . Drug abuse Mother   . Drug abuse Father   . Diabetes Maternal Grandmother   . Hypertension Maternal Grandmother   . Diabetes Paternal Grandmother   . Diabetes Paternal Grandfather     Current Outpatient Prescriptions on File Prior to Visit  Medication Sig Dispense Refill  . fluconazole (DIFLUCAN) 150 MG tablet Take 1 tablet (150 mg total) by mouth once. Pick up the refill and and take second dose in 5 days if symptoms have not resolved (Patient not taking: Reported on 01/13/2015) 1 tablet 1  . gabapentin (NEURONTIN) 600 MG tablet Take 600 mg by mouth 3 (three) times daily.    Marland Kitchen ibuprofen (ADVIL,MOTRIN) 200 MG tablet Take 800 mg by mouth every 6 (six) hours as needed for headache or moderate pain.    . methadone (DOLOPHINE) 10 MG/5ML solution Take 17.5 mLs (35 mg total) by mouth daily. (Patient  taking differently: Take 48 mg by mouth daily. )    . Multiple Vitamin (MULTIVITAMIN WITH MINERALS) TABS tablet Take 1 tablet by mouth daily.    . naproxen sodium (ANAPROX) 220 MG tablet Take 440 mg by mouth 2 (two) times daily as needed (for headaches.).    Marland Kitchen phenazopyridine (PYRIDIUM) 200 MG tablet Take 1 tablet (200 mg total) by mouth 3 (three) times daily. (Patient not taking: Reported on 01/13/2015) 6 tablet 0  . QUEtiapine (SEROQUEL) 300 MG tablet Take 300 mg by mouth at bedtime.     No current facility-administered medications on file prior to visit.    Patient Information Form: Screening and ROS  AUDIT-C Score: 3 Do you feel safe in relationships? Yes.   PHQ-2:negative  Review of Symptoms  General:  Negative for nexplained weight loss, fever Skin: Negative for new or changing mole, sore that won't heal HEENT: Negative for trouble hearing, trouble seeing, ringing in ears, mouth sores, hoarseness, change in voice, dysphagia. CV:  Negative for chest pain, dyspnea, edema, palpitations Resp: Negative for cough, dyspnea, hemoptysis GI: Negative for nausea, vomiting, diarrhea, constipation, abdominal pain, melena, hematochezia. GU: Negative for dysuria, incontinence, urinary hesitance, hematuria, vaginal or penile discharge, polyuria, sexual difficulty, lumps in testicle or breasts  MSK: Negative for muscle cramps or aches, joint pain or swelling Neuro: Negative for headaches, weakness, numbness, dizziness, passing out/fainting Psych: Negative for depression, anxiety, memory problems  Physical Exam Filed Vitals:   03/02/15 1011  BP: 120/80  Pulse: 90  Temp: 98.3 F (36.8 C)    Gen: NAD, Well nourished, Well developed HEENT: PERLA, EOMI, [place]/AT Neck: no JVD Cardio: RRR, No murmurs/gallops/rubs Lungs: CTA, no wheezes, rhonchi, crackles Abd: NABS, soft nontender nondistended MSK: ROM normal  Neuro: CN 2-12 intact, MS 5/5 B/L UE and LE, +2 patellar and achilles relfex b/l   Psych: AAO x 3     Chemistry      Component Value Date/Time   NA 138 11/09/2012 2109   K 3.6 11/09/2012 2109   CL 101 11/09/2012 2109   CO2 28 11/09/2012 2109   BUN 15 11/09/2012 2109   CREATININE 0.93 11/09/2012 2109   CREATININE 0.47 05/26/2009 0849      Component Value Date/Time   CALCIUM 10.2 11/09/2012 2109   ALKPHOS 82 11/09/2012 2109   AST 21 11/09/2012 2109   ALT 71* 11/09/2012 2109   BILITOT 0.1* 11/09/2012 2109      Lab Results  Component Value Date   WBC 8.2 11/09/2012   HGB 13.0 11/09/2012   HCT 39.0 11/09/2012   MCV 87.2 11/09/2012   PLT 349 11/09/2012   Lab Results  Component Value Date   TSH 0.459 02/05/2008   No results found for: HGBA1C

## 2015-03-03 LAB — GC/CHLAMYDIA PROBE AMP (~~LOC~~) NOT AT ARMC
CHLAMYDIA, DNA PROBE: NEGATIVE
NEISSERIA GONORRHEA: NEGATIVE

## 2015-03-03 LAB — HIV ANTIBODY (ROUTINE TESTING W REFLEX): HIV 1&2 Ab, 4th Generation: NONREACTIVE

## 2015-03-03 LAB — CYTOLOGY - PAP

## 2015-03-03 NOTE — Telephone Encounter (Signed)
Attempted to call pt to let her know her test results were all negative.  This includes HIV, syphilis, gonorrhea, chlamydia, and her wet prep.  She did not pick up so LVM to call us back, please let her know this when she calls.  Thanks [employer]. Awanda Mink, DO of [facility] 03/03/2015, 9:18 AM

## 2015-03-04 NOTE — Telephone Encounter (Signed)
Patient came in to office today asking for lab results. I informed her that we have tried to reach her and she stated that she has not been checking her messages. I gave her the results verbally and printed out.

## 2015-03-04 NOTE — Telephone Encounter (Signed)
LVM for patient to call back to inform her of below results

## 2015-03-07 ENCOUNTER — Encounter: Payer: Self-pay | Admitting: Family Medicine

## 2015-03-07 ENCOUNTER — Telehealth: Payer: Self-pay | Admitting: Family Medicine

## 2015-03-07 NOTE — Telephone Encounter (Signed)
Attempted to call pt in regards to her pap smear results.  LVM to call us back.  Please let her know that there were abnormal cells seen on her pap smear (ASCUS w/ + HPV) and I would recommend further testing at this time with a colposcopy.  She may schedule this with our women's clinic at her convenience.  I will also send a letter to her.    Thanks [employer]. Awanda Mink, DO of [facility] 03/07/2015, 8:22 AM

## 2015-03-09 NOTE — Telephone Encounter (Signed)
LVM for patient to call back. ?

## 2015-03-09 NOTE — Telephone Encounter (Signed)
Spoke with patient and informed her of below. She states that she has always had her paps come back with that result. She did have a colpo done 11 years ago when she was 52. Patient does want to get colpo due to it being over 10 years since last one. I have scheduled her on colpo clinic Thursday 03/25/2015 at 11:30am

## 2015-03-15 ENCOUNTER — Ambulatory Visit: Payer: Medicare Other | Admitting: Family Medicine

## 2015-03-24 ENCOUNTER — Ambulatory Visit (INDEPENDENT_AMBULATORY_CARE_PROVIDER_SITE_OTHER): Payer: Medicare Other | Admitting: Neurology

## 2015-03-24 ENCOUNTER — Encounter: Payer: Self-pay | Admitting: Neurology

## 2015-03-24 VITALS — BP 120/70 | HR 76 | Resp 16 | Ht 67.0 in | Wt 159.2 lb

## 2015-03-24 DIAGNOSIS — Z720 Tobacco use: Secondary | ICD-10-CM | POA: Diagnosis not present

## 2015-03-24 DIAGNOSIS — G43009 Migraine without aura, not intractable, without status migrainosus: Secondary | ICD-10-CM

## 2015-03-24 MED ORDER — VENLAFAXINE HCL ER 37.5 MG PO CP24: ORAL_CAPSULE | ORAL | Status: DC

## 2015-03-24 MED ORDER — SUMATRIPTAN SUCCINATE 100 MG PO TABS: ORAL_TABLET | ORAL | Status: DC

## 2015-03-24 NOTE — Progress Notes (Signed)
NEUROLOGY CONSULTATION NOTE  Rhonda Cabrera MRN: [id number] DOB: 1986/06/06  Referring provider: Dr. Awanda Mink Primary care provider: Dr. Awanda Mink  Reason for consult:  migraine  HISTORY OF PRESENT ILLNESS: Rhonda Cabrera is a 29 year old right-handed woman with Bipolar 1 disorder, depression, hypertension, anxiety, tobacco use and history of polysubstance abuse who presents for headache.  Records and labs reviewed.  Onset:  Since childhood but worse over past 5 years Location:  Retro-orbital and temporal, either bilateral or unilateral Quality:  Throbbing, sharp, squeezing Intensity:  10/10 Aura:  No, although one time preceded by dizziness. Prodrome:  no Associated symptoms:  Nausea, photophobia, phonophobia, osmophobia.  Sometimes sees spots. Duration:  Usually 1 day (sometimes up to 4 days) Frequency:  2 to 5 days per month Triggers/exacerbating factors:  none Relieving factors:  none Activity:  Needs to lay down in dark  Past abortive therapy:  Ibuprofen 800mg  Past preventative therapy:  none  Current abortive therapy:  naproxen 440mg  (ineffective) Current preventative therapy:  none Other medication:  gabapentin 600mg  three times daily, methadone 35mg , quetiapine 300mg   Caffeine:  Mountain Dew daily Alcohol:  no Smoker:  yes Diet:  Poor. Skips meals.  Does not keep hydrated. Exercise:  Not routine Depression/stress:  Stress related to marriage and raising five children Sleep hygiene:  good Family history of headache:  Mom, maternal aunts  PAST MEDICAL HISTORY: Past Medical History  Diagnosis Date  . Bipolar 1 disorder   . Opiate addiction   . Abnormal Pap smear   . Depression   . Polysubstance abuse   . Hypertension   . Anxiety   . Chlamydia 07/12/2012  . Headache     PAST SURGICAL HISTORY: Past Surgical History  Procedure Laterality Date  . Vaginal deliveries    . Tubal ligation  09/08/2011    Procedure: POST PARTUM TUBAL LIGATION;  Surgeon: Emeterio Reeve, MD;  Location: [facility];  Service: Gynecology;  Laterality: Bilateral;  . Cholecystectomy  11/04/2012    Procedure: LAPAROSCOPIC CHOLECYSTECTOMY WITH INTRAOPERATIVE CHOLANGIOGRAM;  Surgeon: Imogene Burn. Georgette Dover, MD;  Location: [facility];  Service: General;  Laterality: N/A;    MEDICATIONS: Current Outpatient Prescriptions on File Prior to Visit  Medication Sig Dispense Refill  . gabapentin (NEURONTIN) 600 MG tablet Take 600 mg by mouth 3 (three) times daily.    Marland Kitchen ibuprofen (ADVIL,MOTRIN) 200 MG tablet Take 800 mg by mouth every 6 (six) hours as needed for headache or moderate pain.    . naproxen sodium (ANAPROX) 220 MG tablet Take 440 mg by mouth 2 (two) times daily as needed (for headaches.).    Marland Kitchen QUEtiapine (SEROQUEL) 300 MG tablet Take 300 mg by mouth at bedtime.     No current facility-administered medications on file prior to visit.    ALLERGIES: No Known Allergies  FAMILY HISTORY: Family History  Problem Relation Age of Onset  . Drug abuse Mother   . Drug abuse Father   . Diabetes Maternal Grandmother   . Hypertension Maternal Grandmother   . Diabetes Paternal Grandmother   . Diabetes Paternal Grandfather     SOCIAL HISTORY: History   Social History  . Marital Status: Married    Spouse Name: N/A  . Number of Children: 6  . Years of Education: N/A   Occupational History  . UNEMPLOYED    Social History Main Topics  . Smoking status: Current Every Day Smoker -- 1.00 packs/day    Types: Cigarettes  . Smokeless tobacco: Never Used  .  Alcohol Use: No  . Drug Use: No     Comment: 2. 5 years ago   . Sexual Activity:    Partners: Male    Patent examiner Protection: Surgical   Other Topics Concern  . Not on file   Social History Narrative   Daily caffeine     REVIEW OF SYSTEMS: Constitutional: No fevers, chills, or sweats, no generalized fatigue, change in appetite Eyes: No visual changes, double vision, eye pain Ear, nose and throat: No hearing loss, ear  pain, nasal congestion, sore throat Cardiovascular: No chest pain, palpitations Respiratory:  No shortness of breath at rest or with exertion, wheezes GastrointestinaI: No nausea, vomiting, diarrhea, abdominal pain, fecal incontinence Genitourinary:  No dysuria, urinary retention or frequency Musculoskeletal:  No neck pain, back pain Integumentary: No rash, pruritus, skin lesions Neurological: as above Psychiatric: No depression, insomnia, anxiety Endocrine: No palpitations, fatigue, diaphoresis, mood swings, change in appetite, change in weight, increased thirst Hematologic/Lymphatic:  No anemia, purpura, petechiae. Allergic/Immunologic: no itchy/runny eyes, nasal congestion, recent allergic reactions, rashes  PHYSICAL EXAM: Filed Vitals:   03/24/15 1551  BP: 120/70  Pulse: 76  Resp: 16   General: No acute distress Head:  Normocephalic/atraumatic Eyes:  fundi unremarkable, without vessel changes, exudates, hemorrhages or papilledema. Neck: supple, no paraspinal tenderness, full range of motion Back: No paraspinal tenderness Heart: regular rate and rhythm Lungs: Clear to auscultation bilaterally. Vascular: No carotid bruits. Neurological Exam: Mental status: alert and oriented to person, place, and time, recent and remote memory intact, fund of knowledge intact, attention and concentration intact, speech fluent and not dysarthric, language intact. Cranial nerves: CN I: not tested CN II: pupils equal, round and reactive to light, visual fields intact, fundi unremarkable, without vessel changes, exudates, hemorrhages or papilledema. CN III, IV, VI:  full range of motion, no nystagmus, no ptosis CN V: facial sensation intact CN VII: upper and lower face symmetric CN VIII: hearing intact CN IX, X: gag intact, uvula midline CN XI: sternocleidomastoid and trapezius muscles intact CN XII: tongue midline Bulk & Tone: normal, no fasciculations. Motor:  5/5 throughout Sensation:   Temperature and vibration intact Deep Tendon Reflexes:  2+ throughout, toes downgoing Finger to nose testing:  No dysmetria Heel to shin:  No dysmetria Gait:  Normal station and stride.  Able to turn and walk in tandem. Romberg negative.  IMPRESSION: Migraine without aura Tobacco use  PLAN: 1.  Start venlafaxine ER 37.5mg  at bedtime for 7 days, then increase to 75mg  at bedtime 2.  Sumatriptan 100mg  with/without naproxen 500mg  for rescue therapy 3.  Cut back on caffeine 4.  Start eating 3 balanced meals a day.  Keep hydrated 5.  Smoking cessation 6.  Call in 4 weeks with update.  Follow up in 3 months.  Thank you for allowing me to take part in the care of this patient.  Metta Clines, DO  CC:  Kennith Maes, DO

## 2015-03-24 NOTE — Patient Instructions (Signed)
1.  Start venlafaxine ER 37.5mg .  Take 1 pill at bedtime for 7 days, then increase to 2 pills at bedtime 2.  At earliest onset of headache, take sumatriptan 100mg  tablet.  May repeat 1 tablet in 2 hours if needed.  Do not exceed 2 tablets in 24 hours.  If this is ineffective, then the next time you have a migraine, take the first dose of sumatriptan with naproxen 500mg  3.  Limit caffeine 4.  Stop smoking 5.  Eat 3 balanced meals a day and keep hydrated. 6.  Call in 4 weeks with update.  Follow up in 3 months.

## 2015-03-25 ENCOUNTER — Ambulatory Visit: Payer: Medicare Other

## 2015-03-29 ENCOUNTER — Telehealth: Payer: Self-pay | Admitting: Neurology

## 2015-03-29 NOTE — Telephone Encounter (Signed)
Pt needs to talk to someone about the medication Dr Tomi Likens put her it is not covered by her ins please call her at [phone]

## 2015-03-30 ENCOUNTER — Telehealth: Payer: Self-pay | Admitting: *Deleted

## 2015-03-30 NOTE — Telephone Encounter (Signed)
Patient is aware that approval Prior Auth was gotten from insurance for her effexor

## 2015-03-30 NOTE — Telephone Encounter (Signed)
Prior  auth gotten 03/30/15

## 2015-04-19 ENCOUNTER — Encounter (HOSPITAL_COMMUNITY): Payer: Self-pay | Admitting: Emergency Medicine

## 2015-04-19 ENCOUNTER — Emergency Department (HOSPITAL_COMMUNITY)
Admission: EM | Admit: 2015-04-19 | Discharge: 2015-04-20 | Disposition: A | Discharge status: Home / Self Care | Payer: Medicare Other | Attending: Emergency Medicine | Admitting: Emergency Medicine

## 2015-04-19 DIAGNOSIS — F319 Bipolar disorder, unspecified: Secondary | ICD-10-CM | POA: Insufficient documentation

## 2015-04-19 DIAGNOSIS — Z791 Long term (current) use of non-steroidal anti-inflammatories (NSAID): Secondary | ICD-10-CM | POA: Insufficient documentation

## 2015-04-19 DIAGNOSIS — I10 Essential (primary) hypertension: Secondary | ICD-10-CM | POA: Insufficient documentation

## 2015-04-19 DIAGNOSIS — F419 Anxiety disorder, unspecified: Secondary | ICD-10-CM | POA: Insufficient documentation

## 2015-04-19 DIAGNOSIS — G43909 Migraine, unspecified, not intractable, without status migrainosus: Secondary | ICD-10-CM | POA: Diagnosis present

## 2015-04-19 DIAGNOSIS — Z8619 Personal history of other infectious and parasitic diseases: Secondary | ICD-10-CM | POA: Insufficient documentation

## 2015-04-19 DIAGNOSIS — Z79899 Other long term (current) drug therapy: Secondary | ICD-10-CM | POA: Diagnosis not present

## 2015-04-19 DIAGNOSIS — Z720 Tobacco use: Secondary | ICD-10-CM | POA: Diagnosis not present

## 2015-04-19 DIAGNOSIS — G43109 Migraine with aura, not intractable, without status migrainosus: Secondary | ICD-10-CM | POA: Diagnosis not present

## 2015-04-19 NOTE — ED Notes (Addendum)
Pt c/o migraine since yesterday, no relief with prescribed medications. Pt c/o nausea, sensitivity to light and sound. Denies vision changes. PERRLA. Pt  Denies dizziness or lightheadedness.  A&Ox4 and ambulatory.

## 2015-04-20 ENCOUNTER — Encounter (HOSPITAL_COMMUNITY): Payer: Self-pay | Admitting: Emergency Medicine

## 2015-04-20 DIAGNOSIS — G43109 Migraine with aura, not intractable, without status migrainosus: Secondary | ICD-10-CM | POA: Diagnosis not present

## 2015-04-20 MED ORDER — DEXAMETHASONE SODIUM PHOSPHATE 10 MG/ML IJ SOLN
10.0000 mg | Freq: Once | INTRAMUSCULAR | Status: AC
  Administered 2015-04-20: 10 mg via INTRAMUSCULAR
  Filled 2015-04-20: qty 1

## 2015-04-20 MED ORDER — KETOROLAC TROMETHAMINE 60 MG/2ML IM SOLN
60.0000 mg | Freq: Once | INTRAMUSCULAR | Status: AC
  Administered 2015-04-20: 60 mg via INTRAMUSCULAR
  Filled 2015-04-20: qty 2

## 2015-04-20 MED ORDER — METOCLOPRAMIDE HCL 5 MG/ML IJ SOLN
10.0000 mg | Freq: Once | INTRAMUSCULAR | Status: AC
  Administered 2015-04-20: 10 mg via INTRAMUSCULAR
  Filled 2015-04-20: qty 2

## 2015-04-20 NOTE — Discharge Instructions (Signed)
Recurrent Migraine Headache A migraine headache is an intense, throbbing pain on one or both sides of your head. Recurrent migraines keep coming back. A migraine can last for 30 minutes to several hours. CAUSES  The exact cause of a migraine headache is not always known. However, a migraine may be caused when nerves in the brain become irritated and release chemicals that cause inflammation. This causes pain. Certain things may also trigger migraines, such as:   Alcohol.  Smoking.  Stress.  Menstruation.  Aged cheeses.  Foods or drinks that contain nitrates, glutamate, aspartame, or tyramine.  Lack of sleep.  Chocolate.  Caffeine.  Hunger.  Physical exertion.  Fatigue.  Medicines used to treat chest pain (nitroglycerine), birth control pills, estrogen, and some blood pressure medicines. SYMPTOMS   Pain on one or both sides of your head.  Pulsating or throbbing pain.  Severe pain that prevents daily activities.  Pain that is aggravated by any physical activity.  Nausea, vomiting, or both.  Dizziness.  Pain with exposure to bright lights, loud noises, or activity.  General sensitivity to bright lights, loud noises, or smells. Before you get a migraine, you may get warning signs that a migraine is coming (aura). An aura may include:  Seeing flashing lights.  Seeing bright spots, halos, or zigzag lines.  Having tunnel vision or blurred vision.  Having feelings of numbness or tingling.  Having trouble talking.  Having muscle weakness. DIAGNOSIS  A recurrent migraine headache is often diagnosed based on:  Symptoms.  Physical examination.  A CT scan or MRI of your head. These imaging tests cannot diagnose migraines but can help rule out other causes of headaches.  TREATMENT  Medicines may be given for pain and nausea. Medicines can also be given to help prevent recurrent migraines. HOME CARE INSTRUCTIONS  Only take over-the-counter or prescription  medicines for pain or discomfort as directed by your health care provider. The use of long-term narcotics is not recommended.  Lie down in a dark, quiet room when you have a migraine.  Keep a journal to find out what may trigger your migraine headaches. For example, write down:  What you eat and drink.  How much sleep you get.  Any change to your diet or medicines.  Limit alcohol consumption.  Quit smoking if you smoke.  Get 7-9 hours of sleep, or as recommended by your health care provider.  Limit stress.  Keep lights dim if bright lights bother you and make your migraines worse. SEEK MEDICAL CARE IF:   You do not get relief from the medicines given to you.  You have a recurrence of pain.  You have a fever. SEEK IMMEDIATE MEDICAL CARE IF:  Your migraine becomes severe.  You have a stiff neck.  You have loss of vision.  You have muscular weakness or loss of muscle control.  You start losing your balance or have trouble walking.  You feel faint or pass out.  You have severe symptoms that are different from your first symptoms. MAKE SURE YOU:   Understand these instructions.  Will watch your condition.  Will get help right away if you are not doing well or get worse. Document Released: 08/29/2001 Document Revised: 04/20/2014 Document Reviewed: 08/11/2013 ExitCare Patient Information 2015 [employer]. This information is not intended to replace advice given to you by your health care provider. Make sure you discuss any questions you have with your health care provider.  

## 2015-04-20 NOTE — ED Provider Notes (Signed)
CSN: [id number]     Arrival date & time 04/19/15  2214 History   First MD Initiated Contact with Patient 04/20/15 0242     Chief Complaint  Patient presents with  . Migraine     (Consider location/radiation/quality/duration/timing/severity/associated sxs/prior Treatment) Patient is a 29 y.o. female presenting with migraines. The history is provided by the patient.  Migraine This is a new problem. The current episode started yesterday. The problem occurs constantly. The problem has not changed since onset.Pertinent negatives include no chest pain, no abdominal pain and no shortness of breath. Nothing aggravates the symptoms. Nothing relieves the symptoms. Treatments tried: imitrex. The treatment provided no relief.    Past Medical History  Diagnosis Date  . Bipolar 1 disorder   . Opiate addiction   . Abnormal Pap smear   . Depression   . Polysubstance abuse   . Hypertension   . Anxiety   . Chlamydia 07/12/2012  . Headache    Past Surgical History  Procedure Laterality Date  . Vaginal deliveries    . Tubal ligation  09/08/2011    Procedure: POST PARTUM TUBAL LIGATION;  Surgeon: Emeterio Reeve, MD;  Location: [facility];  Service: Gynecology;  Laterality: Bilateral;  . Cholecystectomy  11/04/2012    Procedure: LAPAROSCOPIC CHOLECYSTECTOMY WITH INTRAOPERATIVE CHOLANGIOGRAM;  Surgeon: Imogene Burn. Georgette Dover, MD;  Location: [facility];  Service: General;  Laterality: N/A;   Family History  Problem Relation Age of Onset  . Drug abuse Mother   . Drug abuse Father   . Diabetes Maternal Grandmother   . Hypertension Maternal Grandmother   . Diabetes Paternal Grandmother   . Diabetes Paternal Grandfather    History  Substance Use Topics  . Smoking status: Current Every Day Smoker -- 1.00 packs/day    Types: Cigarettes  . Smokeless tobacco: Never Used  . Alcohol Use: No   OB History    Gravida Para Term Preterm AB TAB SAB Ectopic Multiple Living   7 6 3 3 1 1   1 6      Review of Systems   Respiratory: Negative for shortness of breath.   Cardiovascular: Negative for chest pain.  Gastrointestinal: Negative for abdominal pain.  All other systems reviewed and are negative.     Allergies  Review of patient's allergies indicates no known allergies.  Home Medications   Prior to Admission medications   Medication Sig Start Date End Date Taking? Authorizing Provider  gabapentin (NEURONTIN) 600 MG tablet Take 600 mg by mouth 3 (three) times daily.    Historical Provider, MD  ibuprofen (ADVIL,MOTRIN) 200 MG tablet Take 800 mg by mouth every 6 (six) hours as needed for headache or moderate pain.    Historical Provider, MD  methadone (METHADOSE) 40 MG disintegrating tablet Take 40 mg by mouth every 6 (six) hours as needed.    Historical Provider, MD  naproxen sodium (ANAPROX) 220 MG tablet Take 440 mg by mouth 2 (two) times daily as needed (for headaches.).    Historical Provider, MD  QUEtiapine (SEROQUEL) 100 MG tablet  03/11/15   Historical Provider, MD  QUEtiapine (SEROQUEL) 300 MG tablet Take 300 mg by mouth at bedtime.    Historical Provider, MD  SUMAtriptan (IMITREX) 100 MG tablet Take 1tab at earliest onset of headache.  May repeat x1 in 2 hours if headache persists or recurs.  Do not exceed 2 tablets in 24 hours. 03/24/15   Pieter Partridge, DO  venlafaxine XR (EFFEXOR-XR) 37.5 MG 24 hr capsule Take 1cap  at bedtime x7d, then 2caps at bedtime 03/24/15   Pieter Partridge, DO   BP 128/94 mmHg  Pulse 78  Temp(Src) 97.7 F (36.5 C) (Oral)  Resp 18  SpO2 100%  LMP 04/03/2015 Physical Exam  Constitutional: She is oriented to person, place, and time. She appears well-developed and well-nourished. No distress.  HENT:  Head: Normocephalic and atraumatic.  Mouth/Throat: Oropharynx is clear and moist.  Eyes: Conjunctivae and EOM are normal. Pupils are equal, round, and reactive to light.  Neck: Normal range of motion. Neck supple.  Cardiovascular: Normal rate, regular rhythm and intact  distal pulses.   Pulmonary/Chest: Effort normal and breath sounds normal. No stridor. No respiratory distress. She has no wheezes. She has no rales.  Abdominal: Soft. Bowel sounds are normal. There is no tenderness. There is no rebound and no guarding.  Musculoskeletal: Normal range of motion.  Lymphadenopathy:    She has no cervical adenopathy.  Neurological: She is alert and oriented to person, place, and time. She has normal reflexes. She displays normal reflexes. No cranial nerve deficit. She exhibits normal muscle tone.  Skin: Skin is warm and dry.  Psychiatric: She has a normal mood and affect.    ED Course  Procedures (including critical care time) Labs Review Labs Reviewed - No data to display  Imaging Review No results found.   EKG Interpretation None      MDM   Final diagnoses:  Migraine aura without headache    States she needs to go home because kids need to go school.  Well appearing no indication for CT LP    Ezzard Ditmer, MD 04/20/15 6945

## 2015-04-23 ENCOUNTER — Emergency Department (HOSPITAL_COMMUNITY)
Admission: EM | Admit: 2015-04-23 | Discharge: 2015-04-23 | Discharge status: Against Medical Advice | Payer: Medicare Other | Attending: Emergency Medicine | Admitting: Emergency Medicine

## 2015-04-23 ENCOUNTER — Encounter (HOSPITAL_COMMUNITY): Payer: Self-pay | Admitting: Emergency Medicine

## 2015-04-23 DIAGNOSIS — I10 Essential (primary) hypertension: Secondary | ICD-10-CM | POA: Diagnosis not present

## 2015-04-23 DIAGNOSIS — R109 Unspecified abdominal pain: Secondary | ICD-10-CM | POA: Insufficient documentation

## 2015-04-23 DIAGNOSIS — Z720 Tobacco use: Secondary | ICD-10-CM | POA: Diagnosis not present

## 2015-04-23 DIAGNOSIS — R110 Nausea: Secondary | ICD-10-CM | POA: Diagnosis not present

## 2015-04-23 DIAGNOSIS — R202 Paresthesia of skin: Secondary | ICD-10-CM | POA: Insufficient documentation

## 2015-04-23 NOTE — ED Notes (Addendum)
Pt from home, called EMS due to feeling hot inside of her body. Pt A&Ox4. Pt c/o nausea, no vomiting. Ambulatory. Pt sts she can't stay long because she realized she needs to go get her Methadone.

## 2015-04-23 NOTE — ED Notes (Signed)
Before RN can finish triaging, pt asking to leave because she has to go pick up her methadone. Pt sts "I'll be back."

## 2015-04-23 NOTE — ED Notes (Signed)
Bed: VX79 Expected date:  Expected time:  Means of arrival:  Comments: EMS-hot/burning inside body

## 2015-04-23 NOTE — ED Notes (Signed)
Pt sts "I think I'm just going to go" and walked to waiting room.

## 2015-04-23 NOTE — ED Notes (Signed)
Discharge undone. Pt told RN she wanted to leave to go get methadone. Pt unable to get her husband to come pick her up at this time and would now like to stay and be seen. Pt originally denied pain, only c/o brief warmness and tingling in body while trying to have a bowel movement. Pt stated that "it was over before EMS arrived." Now that pt has asked to stay, pt is c/o 6/10 abdominal pain that started this morning. C/o nausea, no vomiting. Pt A&Ox4 and ambulatory.

## 2015-04-27 ENCOUNTER — Ambulatory Visit: Payer: Medicare Other | Admitting: Family Medicine

## 2015-04-29 ENCOUNTER — Ambulatory Visit (INDEPENDENT_AMBULATORY_CARE_PROVIDER_SITE_OTHER): Payer: Medicare Other | Admitting: Family Medicine

## 2015-04-29 ENCOUNTER — Encounter: Payer: Self-pay | Admitting: Family Medicine

## 2015-04-29 VITALS — BP 115/76 | HR 79 | Temp 98.6°F | Ht 67.0 in | Wt 157.9 lb

## 2015-04-29 DIAGNOSIS — R12 Heartburn: Secondary | ICD-10-CM | POA: Insufficient documentation

## 2015-04-29 DIAGNOSIS — B351 Tinea unguium: Secondary | ICD-10-CM | POA: Diagnosis not present

## 2015-04-29 DIAGNOSIS — G43819 Other migraine, intractable, without status migrainosus: Secondary | ICD-10-CM | POA: Diagnosis not present

## 2015-04-29 LAB — COMPREHENSIVE METABOLIC PANEL
ALK PHOS: 62 U/L (ref 39–117)
ALT: 11 U/L (ref 0–35)
AST: 13 U/L (ref 0–37)
Albumin: 4.1 g/dL (ref 3.5–5.2)
BILIRUBIN TOTAL: 0.4 mg/dL (ref 0.2–1.2)
BUN: 12 mg/dL (ref 6–23)
CHLORIDE: 106 meq/L (ref 96–112)
CO2: 21 meq/L (ref 19–32)
CREATININE: 0.68 mg/dL (ref 0.50–1.10)
Calcium: 9.6 mg/dL (ref 8.4–10.5)
Glucose, Bld: 96 mg/dL (ref 70–99)
Potassium: 4.5 mEq/L (ref 3.5–5.3)
SODIUM: 137 meq/L (ref 135–145)
TOTAL PROTEIN: 6.8 g/dL (ref 6.0–8.3)

## 2015-04-29 MED ORDER — PANTOPRAZOLE SODIUM 40 MG PO TBEC: 40.0000 mg | DELAYED_RELEASE_TABLET | Freq: Every day | ORAL | Status: DC

## 2015-04-29 MED ORDER — TERBINAFINE HCL 250 MG PO TABS: 250.0000 mg | ORAL_TABLET | Freq: Every day | ORAL | Status: DC

## 2015-04-29 NOTE — Progress Notes (Signed)
Rhonda Cabrera is a 29 y.o. who presents today for onychomycosis and stomach issues.  Onychomycosis - Presented two months ago for similar issues.  Has not done anything about this at this time.  STill having toenail (1st phalanx) problems including discoloration, abnormal growth.  Has never had before and denies injury to the area.  Stomach Pains - Had happen about 04/26/15 with epigastric pain after eating.  Denied nausea, vomiting or diarrhea.  Does have some constipation.  No fever, chills or sweats.  Has not taken anything for this as well.  Denies red flags or trial of medication.    Past Medical History  Diagnosis Date  . Bipolar 1 disorder   . Opiate addiction   . Abnormal Pap smear   . Depression   . Polysubstance abuse   . Hypertension   . Anxiety   . Chlamydia 07/12/2012  . Headache     History   Social History  . Marital Status: Married    Spouse Name: N/A  . Number of Children: 6  . Years of Education: N/A   Occupational History  . UNEMPLOYED    Social History Main Topics  . Smoking status: Current Every Day Smoker -- 1.00 packs/day    Types: Cigarettes  . Smokeless tobacco: Never Used  . Alcohol Use: No  . Drug Use: 7.00 per week    Special: Oxycodone     Comment: 2. 5 years ago   . Sexual Activity:    Partners: Male    Patent examiner Protection: Surgical   Other Topics Concern  . Not on file   Social History Narrative   Daily caffeine     Family History  Problem Relation Age of Onset  . Drug abuse Mother   . Drug abuse Father   . Diabetes Maternal Grandmother   . Hypertension Maternal Grandmother   . Diabetes Paternal Grandmother   . Diabetes Paternal Grandfather     Current Outpatient Prescriptions on File Prior to Visit  Medication Sig Dispense Refill  . gabapentin (NEURONTIN) 600 MG tablet Take 600 mg by mouth 3 (three) times daily.    Marland Kitchen ibuprofen (ADVIL,MOTRIN) 200 MG tablet Take 800 mg by mouth every 6 (six) hours as needed for  headache or moderate pain.    . methadone (METHADOSE) 40 MG disintegrating tablet Take 40 mg by mouth every 6 (six) hours as needed.    . naproxen sodium (ANAPROX) 220 MG tablet Take 440 mg by mouth 2 (two) times daily as needed (for headaches.).    Marland Kitchen QUEtiapine (SEROQUEL) 100 MG tablet     . QUEtiapine (SEROQUEL) 300 MG tablet Take 300 mg by mouth at bedtime.    . SUMAtriptan (IMITREX) 100 MG tablet Take 1tab at earliest onset of headache.  May repeat x1 in 2 hours if headache persists or recurs.  Do not exceed 2 tablets in 24 hours. 10 tablet 2  . venlafaxine XR (EFFEXOR-XR) 37.5 MG 24 hr capsule Take 1cap at bedtime x7d, then 2caps at bedtime 60 capsule 0   No current facility-administered medications on file prior to visit.    Physical Exam There were no vitals filed for this visit.  Gen: NAD, Well nourished, Well developed HEENT: PERLA, EOMI, [place]/AT Neck: no JVD Cardio: RRR, No murmurs/gallops/rubs Lungs: CTA, no wheezes, rhonchi, crackles Abd: NABS, soft nontender nondistended MSK: ROM normal  Neuro: CN 2-12 intact, MS 5/5 B/L UE and LE, +2 patellar and achilles relfex b/l  Psych: AAO x 3  Ext: 1st phalanx toenail discoloration/shape distortion      Chemistry      Component Value Date/Time   NA 138 11/09/2012 2109   K 3.6 11/09/2012 2109   CL 101 11/09/2012 2109   CO2 28 11/09/2012 2109   BUN 15 11/09/2012 2109   CREATININE 0.93 11/09/2012 2109   CREATININE 0.47 05/26/2009 0849      Component Value Date/Time   CALCIUM 10.2 11/09/2012 2109   ALKPHOS 82 11/09/2012 2109   AST 21 11/09/2012 2109   ALT 71* 11/09/2012 2109   BILITOT 0.1* 11/09/2012 2109

## 2015-04-29 NOTE — Patient Instructions (Signed)
Please take the Terbinafine 250 mg once per day for 3 months.  We will need to see you in about 8-12 weeks to see your feet and check your liver enzymes again.

## 2015-04-29 NOTE — Assessment & Plan Note (Signed)
Trial of Protonix 40 mg qd - F/U with lab work or evaluation if worsening or more prevalent.

## 2015-04-29 NOTE — Assessment & Plan Note (Signed)
Needs PO medication for 3-6 months - Check CMET today for LFT's - F/U in 8-12 weeks

## 2015-04-30 ENCOUNTER — Encounter: Payer: Self-pay | Admitting: Family Medicine

## 2015-05-10 ENCOUNTER — Encounter (HOSPITAL_COMMUNITY): Payer: Self-pay | Admitting: Emergency Medicine

## 2015-05-10 ENCOUNTER — Telehealth: Payer: Self-pay | Admitting: Family Medicine

## 2015-05-10 ENCOUNTER — Emergency Department (INDEPENDENT_AMBULATORY_CARE_PROVIDER_SITE_OTHER)
Admission: EM | Admit: 2015-05-10 | Discharge: 2015-05-10 | Disposition: A | Discharge status: Home / Self Care | Payer: Medicare Other | Source: Home / Self Care | Attending: Family Medicine | Admitting: Family Medicine

## 2015-05-10 DIAGNOSIS — N390 Urinary tract infection, site not specified: Secondary | ICD-10-CM | POA: Diagnosis not present

## 2015-05-10 LAB — POCT PREGNANCY, URINE: Preg Test, Ur: NEGATIVE

## 2015-05-10 MED ORDER — CEPHALEXIN 500 MG PO CAPS: 500.0000 mg | ORAL_CAPSULE | Freq: Two times a day (BID) | ORAL | Status: DC

## 2015-05-10 MED ORDER — KETOROLAC TROMETHAMINE 60 MG/2ML IM SOLN
60.0000 mg | Freq: Once | INTRAMUSCULAR | Status: AC
  Administered 2015-05-10: 60 mg via INTRAMUSCULAR

## 2015-05-10 MED ORDER — KETOROLAC TROMETHAMINE 60 MG/2ML IM SOLN
INTRAMUSCULAR | Status: AC
  Filled 2015-05-10: qty 2

## 2015-05-10 NOTE — ED Provider Notes (Signed)
Rhonda Cabrera is a 29 y.o. female who presents to Urgent Care today for urinary frequency urgency and dysuria present for the last few days. No fevers chills nausea vomiting or diarrhea. Symptoms consistent with prior UTI. Patient is quite uncomfortable today in clinic. She's tried AZO which helps some.   Past Medical History  Diagnosis Date  . Bipolar 1 disorder   . Opiate addiction   . Abnormal Pap smear   . Depression   . Polysubstance abuse   . Hypertension   . Anxiety   . Chlamydia 07/12/2012  . Headache    Past Surgical History  Procedure Laterality Date  . Vaginal deliveries    . Tubal ligation  09/08/2011    Procedure: POST PARTUM TUBAL LIGATION;  Surgeon: Emeterio Reeve, MD;  Location: [facility];  Service: Gynecology;  Laterality: Bilateral;  . Cholecystectomy  11/04/2012    Procedure: LAPAROSCOPIC CHOLECYSTECTOMY WITH INTRAOPERATIVE CHOLANGIOGRAM;  Surgeon: Imogene Burn. Georgette Dover, MD;  Location: [facility];  Service: General;  Laterality: N/A;   History  Substance Use Topics  . Smoking status: Current Every Day Smoker -- 1.00 packs/day    Types: Cigarettes  . Smokeless tobacco: Never Used  . Alcohol Use: No   ROS as above Medications: Current Facility-Administered Medications  Medication Dose Route Frequency Provider Last Rate Last Dose  . ketorolac (TORADOL) injection 60 mg  60 mg Intramuscular Once Gregor Hams, MD       Current Outpatient Prescriptions  Medication Sig Dispense Refill  . cephALEXin (KEFLEX) 500 MG capsule Take 1 capsule (500 mg total) by mouth 2 (two) times daily. 21 capsule 0  . gabapentin (NEURONTIN) 600 MG tablet Take 600 mg by mouth 3 (three) times daily.    Marland Kitchen ibuprofen (ADVIL,MOTRIN) 200 MG tablet Take 800 mg by mouth every 6 (six) hours as needed for headache or moderate pain.    . methadone (METHADOSE) 40 MG disintegrating tablet Take 40 mg by mouth every 6 (six) hours as needed.    . naproxen sodium (ANAPROX) 220 MG tablet Take 440 mg by mouth 2  (two) times daily as needed (for headaches.).    Marland Kitchen pantoprazole (PROTONIX) 40 MG tablet Take 1 tablet (40 mg total) by mouth daily. 30 tablet 3  . QUEtiapine (SEROQUEL) 300 MG tablet Take 300 mg by mouth at bedtime.    . SUMAtriptan (IMITREX) 100 MG tablet Take 1tab at earliest onset of headache.  May repeat x1 in 2 hours if headache persists or recurs.  Do not exceed 2 tablets in 24 hours. 10 tablet 2  . terbinafine (LAMISIL) 250 MG tablet Take 1 tablet (250 mg total) by mouth daily. 90 tablet 1  . venlafaxine XR (EFFEXOR-XR) 37.5 MG 24 hr capsule Take 1cap at bedtime x7d, then 2caps at bedtime 60 capsule 0   No Known Allergies   Exam:  BP 112/77 mmHg  Pulse 55  Temp(Src) 99.1 F (37.3 C) (Oral)  Resp 16  SpO2 100%  LMP 04/03/2015 Gen: Well NAD HEENT: EOMI,  MMM Lungs: Normal work of breathing. CTABL Heart: RRR no MRG Abd: NABS, Soft. Nondistended, Nontender no CV angle tenderness to percussion Exts: Brisk capillary refill, warm and well perfused.   She was given 60 mg IM Toradol prior to discharge  Results for orders placed or performed during the hospital encounter of 05/10/15 (from the past 24 hour(s))  Pregnancy, urine POC     Status: None   Collection Time: 05/10/15 11:37 AM  Result Value Ref  Range   Preg Test, Ur NEGATIVE NEGATIVE   No results found.  Assessment and Plan: 29 y.o. female with urinary tract infection. Treat with Keflex. Culture pending. Return as needed. Did not obtain a urine dipstick due to AZO contamination.  Discussed warning signs or symptoms. Please see discharge instructions. Patient expresses understanding.     Gregor Hams, MD 05/10/15 1146

## 2015-05-10 NOTE — Telephone Encounter (Signed)
Pt asking for advice on uti, patient knows she cannot get worked in today but had a few questions re: her symptoms

## 2015-05-10 NOTE — Discharge Instructions (Signed)
Thank you for coming in today. If your belly pain worsens, or you have high fever, bad vomiting, blood in your stool or black tarry stool go to the Emergency Room.   Urinary Tract Infection Urinary tract infections (UTIs) can develop anywhere along your urinary tract. Your urinary tract is your body's drainage system for removing wastes and extra water. Your urinary tract includes two kidneys, two ureters, a bladder, and a urethra. Your kidneys are a pair of bean-shaped organs. Each kidney is about the size of your fist. They are located below your ribs, one on each side of your spine. CAUSES Infections are caused by microbes, which are microscopic organisms, including fungi, viruses, and bacteria. These organisms are so small that they can only be seen through a microscope. Bacteria are the microbes that most commonly cause UTIs. SYMPTOMS  Symptoms of UTIs may vary by age and gender of the patient and by the location of the infection. Symptoms in young women typically include a frequent and intense urge to urinate and a painful, burning feeling in the bladder or urethra during urination. Older women and men are more likely to be tired, shaky, and weak and have muscle aches and abdominal pain. A fever may mean the infection is in your kidneys. Other symptoms of a kidney infection include pain in your back or sides below the ribs, nausea, and vomiting. DIAGNOSIS To diagnose a UTI, your caregiver will ask you about your symptoms. Your caregiver also will ask to provide a urine sample. The urine sample will be tested for bacteria and white blood cells. White blood cells are made by your body to help fight infection. TREATMENT  Typically, UTIs can be treated with medication. Because most UTIs are caused by a bacterial infection, they usually can be treated with the use of antibiotics. The choice of antibiotic and length of treatment depend on your symptoms and the type of bacteria causing your  infection. HOME CARE INSTRUCTIONS  If you were prescribed antibiotics, take them exactly as your caregiver instructs you. Finish the medication even if you feel better after you have only taken some of the medication.  Drink enough water and fluids to keep your urine clear or pale yellow.  Avoid caffeine, tea, and carbonated beverages. They tend to irritate your bladder.  Empty your bladder often. Avoid holding urine for long periods of time.  Empty your bladder before and after sexual intercourse.  After a bowel movement, women should cleanse from front to back. Use each tissue only once. SEEK MEDICAL CARE IF:   You have back pain.  You develop a fever.  Your symptoms do not begin to resolve within 3 days. SEEK IMMEDIATE MEDICAL CARE IF:   You have severe back pain or lower abdominal pain.  You develop chills.  You have nausea or vomiting.  You have continued burning or discomfort with urination. MAKE SURE YOU:   Understand these instructions.  Will watch your condition.  Will get help right away if you are not doing well or get worse. Document Released: 09/13/2005 Document Revised: 06/04/2012 Document Reviewed: 01/12/2012 ExitCare Patient Information 2015 [employer]. This information is not intended to replace advice given to you by your health care provider. Make sure you discuss any questions you have with your health care provider.  

## 2015-05-10 NOTE — Telephone Encounter (Signed)
Pt was already at urgent care to take of the UTI.  Derl Barrow, RN

## 2015-05-10 NOTE — ED Notes (Signed)
Pt states that she has a UTI with painful urination

## 2015-05-11 LAB — URINE CULTURE

## 2015-05-21 NOTE — ED Notes (Signed)
Insignificant growth, no further action required re UA culture

## 2015-06-10 ENCOUNTER — Ambulatory Visit (INDEPENDENT_AMBULATORY_CARE_PROVIDER_SITE_OTHER): Payer: Medicare Other | Admitting: Family Medicine

## 2015-06-10 ENCOUNTER — Encounter: Payer: Self-pay | Admitting: Family Medicine

## 2015-06-10 VITALS — BP 91/75 | HR 78 | Temp 98.1°F | Wt 154.8 lb

## 2015-06-10 DIAGNOSIS — B351 Tinea unguium: Secondary | ICD-10-CM | POA: Diagnosis present

## 2015-06-10 DIAGNOSIS — R300 Dysuria: Secondary | ICD-10-CM | POA: Diagnosis not present

## 2015-06-10 DIAGNOSIS — G43819 Other migraine, intractable, without status migrainosus: Secondary | ICD-10-CM | POA: Diagnosis not present

## 2015-06-10 DIAGNOSIS — N3001 Acute cystitis with hematuria: Secondary | ICD-10-CM

## 2015-06-10 LAB — POCT UA - MICROSCOPIC ONLY

## 2015-06-10 MED ORDER — NITROFURANTOIN MONOHYD MACRO 100 MG PO CAPS: 100.0000 mg | ORAL_CAPSULE | Freq: Two times a day (BID) | ORAL | Status: DC

## 2015-06-10 NOTE — Patient Instructions (Signed)
Thank you for coming in, today!  I do think you have a UTI. I will test it and let you know what the results are in a few days. I will prescribe an antibiotic called Macrobid (nitrofurantoin), twice per day for 7 days. You can continue to take AZO if it helps.  Call or come back as you need. Please feel free to call with any questions or concerns at any time, at [phone]. --Dr. Venetia Maxon

## 2015-06-10 NOTE — Progress Notes (Signed)
   Subjective:    Patient ID: Rhonda Cabrera, female    DOB: Jan 28, 1986, 29 y.o.   MRN: [id number]  HPI: Pt presents to clinic for SDA for UTI-type symptoms; she has had multiple UTI's in the past and reports she gets a few per year. She was seen 1 month ago and was given Keflex for a UTI (culture had insignificant growth) that she took for only 2 days because the medication was inadvertently thrown out in the garbage; she does think her symptoms at least improved (if they didn't go away completely) with only the 2 days. She describes pain with urination, increased frequency ("many times per day,") increased urge, tenderness over her bladder, and incomplete emptying. She has no vaginal discharge, bleeding. Her last intercourse was a few days ago with a new partner. She has been using AZO products with some relief.  She has never seen a urologist but has considered asking for a referral. She has never had kidney stones, pyelonephritis, or any hospitalizations for UTI-related illness.  Review of Systems: As above.     Objective:   Physical Exam BP 91/75 mmHg  Pulse 78  Temp(Src) 98.1 F (36.7 C)  Wt 154 lb 12.8 oz (70.217 kg) Gen: well-appearing adult female in NAD HEENT: [place]/AT, EOMI, PERRLA, MMM Cardio: RRR, no murmur appreciated Pulm: CTAB, no wheezes, normal WOB Abd: soft, marked suprapubic tenderness, BS+; no flank or CVA tenderness Ext: warm, well-perfused, no LE edema  UA: unable to be performed due to color of urine, secondary to AZO product dye and suspected blood Urine micro: 2+ bacteria, 5-10 RBC / hpf, >20 WBC / hpf, 5-10 epithelial cells / hpf Urine culture: pending     Assessment & Plan:  29yo female with history, exam, and urine grossly and microscopically consistent with acute cystitis with hematuria - strongly doubt frank pyelonephritis, kidney stone, or other intra-abdominal pathology - UA unable to be performed, as above - possibly incompletely treated from a few  weeks ago or possibly new but recurrent infection - last urine culture non-diagnostic, but hx of pan-sensitive E.coli otherwise  Plan: - Rx for nitrofurantin BID for 7 days (given recent incomplete treatment with Keflex to hopefully avoid possible resistance) - continue AZO per pt preference - counseled on red flags such as worse pain, bleeding, frank fever / vomiting / other systemic symptoms - f/u as needed, otherwise  Emmaline Kluver, MD PGY-3, [facility] 06/11/2015, 12:36 AM

## 2015-06-11 ENCOUNTER — Encounter: Payer: Self-pay | Admitting: Family Medicine

## 2015-06-12 LAB — URINE CULTURE

## 2015-06-14 ENCOUNTER — Telehealth: Payer: Self-pay | Admitting: Family Medicine

## 2015-06-14 MED ORDER — CIPROFLOXACIN HCL 500 MG PO TABS: 500.0000 mg | ORAL_TABLET | Freq: Two times a day (BID) | ORAL | Status: DC

## 2015-06-14 NOTE — Telephone Encounter (Signed)
Called pt to relay urine cx results -- positive for Enterobacter UTI, treating currently with Macrobid with good relief per pt. Culture shows intermediate sensitivity to Macrobid, however, so will switch to Cipro for 7 days. Pt understands. Rx sent electronically. Pt to f/u otherwise as needed. --CMS

## 2015-06-28 ENCOUNTER — Ambulatory Visit: Payer: Medicare Other | Admitting: Neurology

## 2015-07-02 ENCOUNTER — Ambulatory Visit: Payer: Medicare Other | Admitting: Internal Medicine

## 2015-07-02 ENCOUNTER — Telehealth: Payer: Self-pay | Admitting: Family Medicine

## 2015-07-20 ENCOUNTER — Other Ambulatory Visit (HOSPITAL_COMMUNITY)
Admission: RE | Admit: 2015-07-20 | Discharge: 2015-07-20 | Disposition: A | Discharge status: Home / Self Care | Payer: Medicare Other | Source: Ambulatory Visit | Attending: Family Medicine | Admitting: Family Medicine

## 2015-07-20 ENCOUNTER — Encounter: Payer: Self-pay | Admitting: Family Medicine

## 2015-07-20 ENCOUNTER — Ambulatory Visit (INDEPENDENT_AMBULATORY_CARE_PROVIDER_SITE_OTHER): Payer: Medicare Other | Admitting: Family Medicine

## 2015-07-20 VITALS — BP 117/67 | HR 63 | Temp 98.6°F | Ht 67.0 in | Wt 152.5 lb

## 2015-07-20 DIAGNOSIS — IMO0002 Reserved for concepts with insufficient information to code with codable children: Secondary | ICD-10-CM

## 2015-07-20 DIAGNOSIS — Z113 Encounter for screening for infections with a predominantly sexual mode of transmission: Secondary | ICD-10-CM | POA: Insufficient documentation

## 2015-07-20 DIAGNOSIS — R3989 Other symptoms and signs involving the genitourinary system: Secondary | ICD-10-CM | POA: Diagnosis not present

## 2015-07-20 DIAGNOSIS — Z20828 Contact with and (suspected) exposure to other viral communicable diseases: Secondary | ICD-10-CM | POA: Diagnosis not present

## 2015-07-20 DIAGNOSIS — G43819 Other migraine, intractable, without status migrainosus: Secondary | ICD-10-CM | POA: Diagnosis not present

## 2015-07-20 DIAGNOSIS — Z114 Encounter for screening for human immunodeficiency virus [HIV]: Secondary | ICD-10-CM

## 2015-07-20 DIAGNOSIS — N390 Urinary tract infection, site not specified: Secondary | ICD-10-CM

## 2015-07-20 DIAGNOSIS — Z7251 High risk heterosexual behavior: Secondary | ICD-10-CM | POA: Diagnosis not present

## 2015-07-20 DIAGNOSIS — N760 Acute vaginitis: Secondary | ICD-10-CM | POA: Diagnosis not present

## 2015-07-20 DIAGNOSIS — R399 Unspecified symptoms and signs involving the genitourinary system: Secondary | ICD-10-CM | POA: Diagnosis not present

## 2015-07-20 DIAGNOSIS — R896 Abnormal cytological findings in specimens from other organs, systems and tissues: Secondary | ICD-10-CM

## 2015-07-20 DIAGNOSIS — B351 Tinea unguium: Secondary | ICD-10-CM | POA: Diagnosis present

## 2015-07-20 DIAGNOSIS — Z1159 Encounter for screening for other viral diseases: Secondary | ICD-10-CM | POA: Diagnosis not present

## 2015-07-20 DIAGNOSIS — Z654 Victim of crime and terrorism: Secondary | ICD-10-CM

## 2015-07-20 LAB — HIV ANTIBODY (ROUTINE TESTING W REFLEX): HIV: NONREACTIVE

## 2015-07-20 LAB — POCT UA - MICROSCOPIC ONLY

## 2015-07-20 LAB — POCT URINALYSIS DIPSTICK
BILIRUBIN UA: NEGATIVE
GLUCOSE UA: NEGATIVE
Ketones, UA: NEGATIVE
Nitrite, UA: NEGATIVE
PROTEIN UA: NEGATIVE
RBC UA: NEGATIVE
Spec Grav, UA: >=1.03
Urobilinogen, UA: 0.2
pH, UA: 5.5

## 2015-07-20 LAB — POCT WET PREP (WET MOUNT): CLUE CELLS WET PREP WHIFF POC: POSITIVE

## 2015-07-20 LAB — HEPATITIS PANEL, ACUTE
HCV AB: NEGATIVE
Hep A IgM: NONREACTIVE
Hep B C IgM: NONREACTIVE
Hepatitis B Surface Ag: NEGATIVE

## 2015-07-20 MED ORDER — METRONIDAZOLE 500 MG PO TABS: 500.0000 mg | ORAL_TABLET | Freq: Two times a day (BID) | ORAL | Status: DC

## 2015-07-20 MED ORDER — SULFAMETHOXAZOLE-TRIMETHOPRIM 800-160 MG PO TABS
1.0000 | ORAL_TABLET | Freq: Two times a day (BID) | ORAL | Status: DC
Start: 2015-07-20 — End: 2015-10-05

## 2015-07-20 MED ORDER — FLUCONAZOLE 150 MG PO TABS
150.0000 mg | ORAL_TABLET | Freq: Once | ORAL | Status: DC
Start: 2015-07-20 — End: 2015-10-05

## 2015-07-20 NOTE — Patient Instructions (Addendum)
Treating for UTi with bactrim One pill twice a day for 3 days Will call with other test results Return if not getting better  Also take flagyl twice a day for 1 week Sent in diflucan in case you get a yeast infection with this.   Schedule appointment for colposcopy.  Be well, Dr. Ardelia Mems
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
## 2015-07-21 DIAGNOSIS — N390 Urinary tract infection, site not specified: Secondary | ICD-10-CM | POA: Insufficient documentation

## 2015-07-21 DIAGNOSIS — IMO0002 Reserved for concepts with insufficient information to code with codable children: Secondary | ICD-10-CM | POA: Insufficient documentation

## 2015-07-21 LAB — RPR

## 2015-07-21 NOTE — Assessment & Plan Note (Signed)
Encouraged patient to schedule appointment in [place] clinic to f/u on this abnormal pap result from March 2016.

## 2015-07-21 NOTE — Assessment & Plan Note (Signed)
Patient reports feeling safe and knowing what resources are available to her. Given handout on [employer] [facility] [place], including info about 24 hour crisis line. Patient appreciative.

## 2015-07-21 NOTE — Assessment & Plan Note (Signed)
Urinalysis suggestive of UTI.  Last culture was resistant to Keflex. Would like to avoid Cipro due to potential to prolong QTc while also on methadone. Will prescribe Bactrim and send urine for culture. Follow-up if not improving. Given prescription for Diflucan in case she develops a yeast infection after this antibiotic course.  When she follows up with PCP, would consider referral to urology due to recurrent nature of these infections.

## 2015-07-22 LAB — URINE CULTURE: Colony Count: 60000

## 2015-07-22 LAB — CERVICOVAGINAL ANCILLARY ONLY
Chlamydia: NEGATIVE
NEISSERIA GONORRHEA: NEGATIVE
TRICH (WINDOWPATH): NEGATIVE

## 2015-07-23 ENCOUNTER — Telehealth: Payer: Self-pay | Admitting: Family Medicine

## 2015-07-23 ENCOUNTER — Encounter: Payer: Self-pay | Admitting: Family Medicine

## 2015-07-23 NOTE — Telephone Encounter (Signed)
Called pt to report all negative STD's. Urine culture was positive for enterobacter, sensitive to bactrim. Patient requested a copy of all labs including STD's mailed to her. Confirmed address with her. Will route result letter to admin team for them to be mailed to patient.  Leeanne Rio, MD

## 2015-10-05 ENCOUNTER — Encounter (HOSPITAL_COMMUNITY): Payer: Self-pay | Admitting: Emergency Medicine

## 2015-10-05 ENCOUNTER — Emergency Department (INDEPENDENT_AMBULATORY_CARE_PROVIDER_SITE_OTHER)
Admission: EM | Admit: 2015-10-05 | Discharge: 2015-10-05 | Disposition: A | Discharge status: Home / Self Care | Payer: Medicare Other | Source: Home / Self Care

## 2015-10-05 ENCOUNTER — Other Ambulatory Visit (HOSPITAL_COMMUNITY)
Admission: RE | Admit: 2015-10-05 | Discharge: 2015-10-05 | Disposition: A | Discharge status: Home / Self Care | Payer: Medicare Other | Source: Ambulatory Visit | Attending: Family Medicine | Admitting: Family Medicine

## 2015-10-05 DIAGNOSIS — N3000 Acute cystitis without hematuria: Secondary | ICD-10-CM | POA: Diagnosis not present

## 2015-10-05 LAB — POCT URINALYSIS DIP (DEVICE)
Bilirubin Urine: NEGATIVE
Glucose, UA: NEGATIVE mg/dL
HGB URINE DIPSTICK: NEGATIVE
KETONES UR: NEGATIVE mg/dL
Nitrite: NEGATIVE
Protein, ur: NEGATIVE mg/dL
SPECIFIC GRAVITY, URINE: 1.015 (ref 1.005–1.030)
UROBILINOGEN UA: 1 mg/dL (ref 0.0–1.0)
pH: 8.5 — ABNORMAL HIGH (ref 5.0–8.0)

## 2015-10-05 MED ORDER — KETOROLAC TROMETHAMINE 30 MG/ML IJ SOLN
30.0000 mg | Freq: Once | INTRAMUSCULAR | Status: AC
  Administered 2015-10-05: 30 mg via INTRAMUSCULAR

## 2015-10-05 MED ORDER — SULFAMETHOXAZOLE-TRIMETHOPRIM 800-160 MG PO TABS: 1.0000 | ORAL_TABLET | Freq: Two times a day (BID) | ORAL | Status: DC

## 2015-10-05 MED ORDER — KETOROLAC TROMETHAMINE 30 MG/ML IJ SOLN
INTRAMUSCULAR | Status: AC
  Filled 2015-10-05: qty 1

## 2015-10-05 MED ORDER — FLUCONAZOLE 200 MG PO TABS: 200.0000 mg | ORAL_TABLET | Freq: Once | ORAL | Status: AC

## 2015-10-05 MED ORDER — PHENAZOPYRIDINE HCL 200 MG PO TABS: 200.0000 mg | ORAL_TABLET | Freq: Three times a day (TID) | ORAL | Status: DC

## 2015-10-05 NOTE — ED Notes (Signed)
Pt here with possible UTI sx's, frequency with slight pressure Denies itch, burning or fever,chills

## 2015-10-05 NOTE — Discharge Instructions (Signed)

## 2015-10-05 NOTE — ED Provider Notes (Signed)
CSN: [id number]     Arrival date & time 10/05/15  6503 History   None    Chief Complaint  Patient presents with  . Urinary Tract Infection    HPI Patient present for possible UTI as sx feel similar to past UTIs. Endorses dysuria, frequency, bladder pressure, urgency, right side flank pain. Denies fever, hematuria, vaginal discharge, pelvic pain, N/V. Sexually active with monogamous female partner and states that she usually urinates after intercourse, but did not after most recent occasion. Adds that she does not like to drink water. NKDA. Past Medical History  Diagnosis Date  . Bipolar 1 disorder ([place])   . Opiate addiction ([place])   . Abnormal Pap smear   . Depression   . Polysubstance abuse   . Hypertension   . Anxiety   . Chlamydia 07/12/2012  . Headache    Past Surgical History  Procedure Laterality Date  . Vaginal deliveries    . Tubal ligation  09/08/2011    Procedure: POST PARTUM TUBAL LIGATION;  Surgeon: Emeterio Reeve, MD;  Location: [facility];  Service: Gynecology;  Laterality: Bilateral;  . Cholecystectomy  11/04/2012    Procedure: LAPAROSCOPIC CHOLECYSTECTOMY WITH INTRAOPERATIVE CHOLANGIOGRAM;  Surgeon: Imogene Burn. Georgette Dover, MD;  Location: [facility];  Service: General;  Laterality: N/A;   Family History  Problem Relation Age of Onset  . Drug abuse Mother   . Drug abuse Father   . Diabetes Maternal Grandmother   . Hypertension Maternal Grandmother   . Diabetes Paternal Grandmother   . Diabetes Paternal Grandfather    Social History  Substance Use Topics  . Smoking status: Current Every Day Smoker -- 1.00 packs/day    Types: Cigarettes  . Smokeless tobacco: Never Used  . Alcohol Use: No   OB History    Gravida Para Term Preterm AB TAB SAB Ectopic Multiple Living   7 6 3 3 1 1   1 6      Review of Systems As noted above. Allergies  Review of patient's allergies indicates no known allergies.  Home Medications   Prior to Admission medications   Medication Sig Start Date  End Date Taking? Authorizing Provider  ciprofloxacin (CIPRO) 500 MG tablet Take 1 tablet (500 mg total) by mouth 2 (two) times daily. 06/14/15   [place], MD  fluconazole (DIFLUCAN) 200 MG tablet Take 1 tablet (200 mg total) by mouth once. 10/05/15 10/12/15  Halina Asano R Sincere Berlanga, PA-C  gabapentin (NEURONTIN) 600 MG tablet Take 600 mg by mouth 3 (three) times daily.    Historical Provider, MD  ibuprofen (ADVIL,MOTRIN) 200 MG tablet Take 800 mg by mouth every 6 (six) hours as needed for headache or moderate pain.    Historical Provider, MD  methadone (METHADOSE) 40 MG disintegrating tablet Take 40 mg by mouth every 6 (six) hours as needed.    Historical Provider, MD  metroNIDAZOLE (FLAGYL) 500 MG tablet Take 1 tablet (500 mg total) by mouth 2 (two) times daily. For 7 days. Do not drink alcohol while taking this medication. 07/20/15   Leeanne Rio, MD  naproxen sodium (ANAPROX) 220 MG tablet Take 440 mg by mouth 2 (two) times daily as needed (for headaches.).    Historical Provider, MD  pantoprazole (PROTONIX) 40 MG tablet Take 1 tablet (40 mg total) by mouth daily. 04/29/15   Nolon Rod, DO  phenazopyridine (PYRIDIUM) 200 MG tablet Take 1 tablet (200 mg total) by mouth 3 (three) times daily. 10/05/15   Jake Seats Starlee Corralejo, PA-C  QUEtiapine (SEROQUEL) 300 MG tablet Take 300 mg by mouth at bedtime.    Historical Provider, MD  sulfamethoxazole-trimethoprim (BACTRIM DS,SEPTRA DS) 800-160 MG tablet Take 1 tablet by mouth 2 (two) times daily. 10/05/15   Emagene Merfeld R Alethia Melendrez, PA-C  SUMAtriptan (IMITREX) 100 MG tablet Take 1tab at earliest onset of headache.  May repeat x1 in 2 hours if headache persists or recurs.  Do not exceed 2 tablets in 24 hours. 03/24/15   Pieter Partridge, DO  terbinafine (LAMISIL) 250 MG tablet Take 1 tablet (250 mg total) by mouth daily. 04/29/15   Nolon Rod, DO  venlafaxine XR (EFFEXOR-XR) 37.5 MG 24 hr capsule Take 1cap at bedtime x7d, then 2caps at bedtime 03/24/15    Pieter Partridge, DO   Meds Ordered and Administered this Visit   Medications  ketorolac (TORADOL) 30 MG/ML injection 30 mg (30 mg Intramuscular Given 10/05/15 1944)    BP 110/76 mmHg  Pulse 73  Temp(Src) 98.6 F (37 C) (Oral)  Resp 20  SpO2 99% No data found.   Physical Exam  Constitutional: She is oriented to person, place, and time. She appears well-developed and well-nourished. No distress.  Blood pressure 110/76, pulse 73, temperature 98.6 F (37 C), temperature source Oral, resp. rate 20, SpO2 99 %.  HENT:  Head: Normocephalic and atraumatic.  Right Ear: External ear normal.  Left Ear: External ear normal.  Eyes: Conjunctivae are normal. Right eye exhibits no discharge. Left eye exhibits no discharge. No scleral icterus.  Cardiovascular: Normal rate, regular rhythm and normal heart sounds.  Exam reveals no gallop and no friction rub.   No murmur heard. Pulmonary/Chest: Effort normal and breath sounds normal. No respiratory distress. She has no wheezes. She has no rales.  Abdominal: Soft. Bowel sounds are normal. She exhibits no distension and no mass. There is no hepatosplenomegaly. There is tenderness (right sided suprapubic pain) in the suprapubic area. There is no rigidity, no rebound, no guarding, no CVA tenderness, no tenderness at McBurney's point and negative Murphy's sign. No hernia.  Neurological: She is alert and oriented to person, place, and time.  Skin: Skin is warm and dry. No rash noted. She is not diaphoretic. No erythema. No pallor.  Psychiatric: She has a normal mood and affect. Her behavior is normal. Judgment and thought content normal.    ED Course  Procedures (including critical care time)  Labs Review Labs Reviewed  POCT URINALYSIS DIP (DEVICE) - Abnormal; Notable for the following:    pH 8.5 (*)    Leukocytes, UA SMALL (*)    All other components within normal limits  URINE CULTURE    Imaging Review No results found.   MDM   Problem List  Items Addressed This Visit    None    Visit Diagnoses    Acute cystitis without hematuria    -  Primary    Relevant Medications    sulfamethoxazole-trimethoprim (BACTRIM DS,SEPTRA DS) 800-160 MG tablet    phenazopyridine (PYRIDIUM) 200 MG tablet       Hygiene discussed along with decreasing the amount of scented soaps she is using. Increase water intake as well.    Nolene Bernheim, PA-C 10/05/15 1952

## 2015-10-07 LAB — URINE CULTURE

## 2015-10-07 NOTE — ED Notes (Signed)
Final report urine C&S positive for E-coli, treatment adequate w Rx provided day of visit to [facility]

## 2015-12-19 DIAGNOSIS — J189 Pneumonia, unspecified organism: Secondary | ICD-10-CM

## 2015-12-19 HISTORY — DX: Pneumonia, unspecified organism: J18.9

## 2016-03-09 ENCOUNTER — Emergency Department (HOSPITAL_COMMUNITY)
Admission: EM | Admit: 2016-03-09 | Discharge: 2016-03-09 | Disposition: A | Discharge status: Home / Self Care | Payer: Medicare Other | Attending: Emergency Medicine | Admitting: Emergency Medicine

## 2016-03-09 ENCOUNTER — Encounter (HOSPITAL_COMMUNITY): Payer: Self-pay | Admitting: Emergency Medicine

## 2016-03-09 ENCOUNTER — Encounter: Payer: Medicare Other | Admitting: Family Medicine

## 2016-03-09 DIAGNOSIS — K0889 Other specified disorders of teeth and supporting structures: Secondary | ICD-10-CM | POA: Diagnosis not present

## 2016-03-09 DIAGNOSIS — F319 Bipolar disorder, unspecified: Secondary | ICD-10-CM | POA: Insufficient documentation

## 2016-03-09 DIAGNOSIS — K047 Periapical abscess without sinus: Secondary | ICD-10-CM | POA: Diagnosis not present

## 2016-03-09 DIAGNOSIS — F419 Anxiety disorder, unspecified: Secondary | ICD-10-CM | POA: Diagnosis not present

## 2016-03-09 DIAGNOSIS — Z8619 Personal history of other infectious and parasitic diseases: Secondary | ICD-10-CM | POA: Insufficient documentation

## 2016-03-09 DIAGNOSIS — Z792 Long term (current) use of antibiotics: Secondary | ICD-10-CM | POA: Diagnosis not present

## 2016-03-09 DIAGNOSIS — Z79899 Other long term (current) drug therapy: Secondary | ICD-10-CM | POA: Insufficient documentation

## 2016-03-09 DIAGNOSIS — I10 Essential (primary) hypertension: Secondary | ICD-10-CM | POA: Diagnosis not present

## 2016-03-09 DIAGNOSIS — F17210 Nicotine dependence, cigarettes, uncomplicated: Secondary | ICD-10-CM | POA: Insufficient documentation

## 2016-03-09 MED ORDER — IBUPROFEN 600 MG PO TABS: 600.0000 mg | ORAL_TABLET | Freq: Three times a day (TID) | ORAL | Status: DC | PRN

## 2016-03-09 MED ORDER — BUPIVACAINE HCL (PF) 0.25 % IJ SOLN
20.0000 mL | Freq: Once | INTRAMUSCULAR | Status: DC
  Filled 2016-03-09: qty 30

## 2016-03-09 MED ORDER — BUPIVACAINE HCL (PF) 0.5 % IJ SOLN
30.0000 mL | Freq: Once | INTRAMUSCULAR | Status: AC
  Administered 2016-03-09: 30 mL

## 2016-03-09 MED ORDER — PENICILLIN V POTASSIUM 500 MG PO TABS: 500.0000 mg | ORAL_TABLET | Freq: Three times a day (TID) | ORAL | Status: AC

## 2016-03-09 NOTE — ED Provider Notes (Signed)
CSN: [id number]     Arrival date & time 03/09/16  0631 History   First MD Initiated Contact with Patient 03/09/16 [phone]     Chief Complaint  Patient presents with  . Dental Pain      HPI Patient presents to the emergency department with complaints of severe right lower dental pain. Her pain is severe in severity at this time. She states the pain just began today. No fevers or chills. No difficulty breathing. No difficulty swallowing. Denies facial pain or swelling.   Past Medical History  Diagnosis Date  . Bipolar 1 disorder ([place])   . Opiate addiction ([place])   . Abnormal Pap smear   . Depression   . Polysubstance abuse   . Hypertension   . Anxiety   . Chlamydia 07/12/2012  . Headache    Past Surgical History  Procedure Laterality Date  . Vaginal deliveries    . Tubal ligation  09/08/2011    Procedure: POST PARTUM TUBAL LIGATION;  Surgeon: Emeterio Reeve, MD;  Location: [facility];  Service: Gynecology;  Laterality: Bilateral;  . Cholecystectomy  11/04/2012    Procedure: LAPAROSCOPIC CHOLECYSTECTOMY WITH INTRAOPERATIVE CHOLANGIOGRAM;  Surgeon: Imogene Burn. Georgette Dover, MD;  Location: [facility];  Service: General;  Laterality: N/A;   Family History  Problem Relation Age of Onset  . Drug abuse Mother   . Drug abuse Father   . Diabetes Maternal Grandmother   . Hypertension Maternal Grandmother   . Diabetes Paternal Grandmother   . Diabetes Paternal Grandfather    Social History  Substance Use Topics  . Smoking status: Current Every Day Smoker -- 1.00 packs/day    Types: Cigarettes  . Smokeless tobacco: Never Used  . Alcohol Use: No   OB History    Gravida Para Term Preterm AB TAB SAB Ectopic Multiple Living   7 6 3 3 1 1   1 6      Review of Systems  All other systems reviewed and are negative.     Allergies  Review of patient's allergies indicates no known allergies.  Home Medications   Prior to Admission medications   Medication Sig Start Date End Date Taking? Authorizing  Provider  ciprofloxacin (CIPRO) 500 MG tablet Take 1 tablet (500 mg total) by mouth 2 (two) times daily. 06/14/15   Sharon [address], MD  gabapentin (NEURONTIN) 600 MG tablet Take 600 mg by mouth 3 (three) times daily.    Historical Provider, MD  ibuprofen (ADVIL,MOTRIN) 600 MG tablet Take 1 tablet (600 mg total) by mouth every 8 (eight) hours as needed. 03/09/16   Jola Schmidt, MD  methadone (METHADOSE) 40 MG disintegrating tablet Take 40 mg by mouth every 6 (six) hours as needed.    Historical Provider, MD  metroNIDAZOLE (FLAGYL) 500 MG tablet Take 1 tablet (500 mg total) by mouth 2 (two) times daily. For 7 days. Do not drink alcohol while taking this medication. 07/20/15   Leeanne Rio, MD  naproxen sodium (ANAPROX) 220 MG tablet Take 440 mg by mouth 2 (two) times daily as needed (for headaches.).    Historical Provider, MD  pantoprazole (PROTONIX) 40 MG tablet Take 1 tablet (40 mg total) by mouth daily. 04/29/15   Tamela Oddi Hess, DO  penicillin v potassium (VEETID) 500 MG tablet Take 1 tablet (500 mg total) by mouth 3 (three) times daily. 03/09/16 03/16/16  Jola Schmidt, MD  phenazopyridine (PYRIDIUM) 200 MG tablet Take 1 tablet (200 mg total) by mouth 3 (three) times daily. 10/05/15  Tishira R Brewington, PA-C  QUEtiapine (SEROQUEL) 300 MG tablet Take 300 mg by mouth at bedtime.    Historical Provider, MD  sulfamethoxazole-trimethoprim (BACTRIM DS,SEPTRA DS) 800-160 MG tablet Take 1 tablet by mouth 2 (two) times daily. 10/05/15   Tishira R Brewington, PA-C  SUMAtriptan (IMITREX) 100 MG tablet Take 1tab at earliest onset of headache.  May repeat x1 in 2 hours if headache persists or recurs.  Do not exceed 2 tablets in 24 hours. 03/24/15   Pieter Partridge, DO  terbinafine (LAMISIL) 250 MG tablet Take 1 tablet (250 mg total) by mouth daily. 04/29/15   Nolon Rod, DO  venlafaxine XR (EFFEXOR-XR) 37.5 MG 24 hr capsule Take 1cap at bedtime x7d, then 2caps at bedtime 03/24/15   Pieter Partridge, DO   BP  151/113 mmHg  Pulse 109  Temp(Src) 97.9 F (36.6 C)  Resp 22  SpO2 98%  LMP 02/08/2016 (Exact Date) Physical Exam  Constitutional: She is oriented to person, place, and time. She appears well-developed and well-nourished.  HENT:  Head: Normocephalic.  Dental tenderness without gingival swelling or fluctuance of tooth #31. crown present on this tooth. Obvious cavity present.  Eyes: EOM are normal.  Neck: Normal range of motion.  Pulmonary/Chest: Effort normal.  Abdominal: She exhibits no distension.  Musculoskeletal: Normal range of motion.  Neurological: She is alert and oriented to person, place, and time.  Psychiatric: She has a normal mood and affect.  Nursing note and vitals reviewed.   ED Course  Procedures (including critical care time)   DENTAL NERVE BLOCK Performed by: Hoy Morn Consent: Verbal consent obtained. Required items: required blood products, implants, devices, and special equipment available Time out: Immediately prior to procedure a "time out" was called to verify the correct patient, procedure, equipment, support staff and site/side marked as required. Indication: Dental pain Nerve block body site: dental nerve, tooth #31 Preparation: Patient was prepped and draped in the usual sterile fashion. Needle gauge: 24 G Location technique: anatomical landmarks Local anesthetic: Bupivicaine 0.5% with Anesthetic total: 4 ml Outcome: pain improved Patient tolerance: Patient tolerated the procedure well with no immediate complications.     Labs Review Labs Reviewed - No data to display  Imaging Review No results found. I have personally reviewed and evaluated these images and lab results as part of my medical decision-making.   EKG Interpretation None      MDM   Final diagnoses:  Pain, dental  Dental infection    Dental Pain. Home with antibiotics and pain medicine. Recommend dental follow up. No signs of gingival abscess. Tolerating  secretions. Airway patent. No sub lingular swelling. Tolerated the nerve block well. Significant improvement in pain     Jola Schmidt, MD 03/09/16 [phone]

## 2016-03-09 NOTE — Discharge Instructions (Signed)
Dental Pain °Dental pain may be caused by many things, including: °· Tooth decay (cavities or caries). Cavities expose the nerve of your tooth to air and hot or cold temperatures. This can cause pain or discomfort. °· Abscess or infection. A dental abscess is a collection of infected pus from a bacterial infection in the inner part of the tooth (pulp). It usually occurs at the end of the tooth's root. °· Injury. °· An unknown reason (idiopathic). °Your pain may be mild or severe. It may only occur when: °· You are chewing. °· You are exposed to hot or cold temperature. °· You are eating or drinking sugary foods or beverages, such as soda or candy. °Your pain may also be constant. °HOME CARE INSTRUCTIONS °Watch your dental pain for any changes. The following actions may help to lessen any discomfort that you are feeling: °· Take medicines only as directed by your dentist. °· If you were prescribed an antibiotic medicine, finish all of it even if you start to feel better. °· Keep all follow-up visits as directed by your dentist. This is important. °· Do not apply heat to the outside of your face. °· Rinse your mouth or gargle with salt water if directed by your dentist. This helps with pain and swelling. °¨ You can make salt water by adding ¼ tsp of salt to 1 cup of warm water. °· Apply ice to the painful area of your face: °¨ Put ice in a plastic bag. °¨ Place a towel between your skin and the bag. °¨ Leave the ice on for 20 minutes, 2-3 times per day. °· Avoid foods or drinks that cause you pain, such as: °¨ Very hot or very cold foods or drinks. °¨ Sweet or sugary foods or drinks. °SEEK MEDICAL CARE IF: °· Your pain is not controlled with medicines. °· Your symptoms are worse. °· You have new symptoms. °SEEK IMMEDIATE MEDICAL CARE IF: °· You are unable to open your mouth. °· You are having trouble breathing or swallowing. °· You have a fever. °· Your face, neck, or jaw is swollen. °  °This information is not  intended to replace advice given to you by your health care provider. Make sure you discuss any questions you have with your health care provider. °  °Document Released: 12/04/2005 Document Revised: 04/20/2015 Document Reviewed: 11/30/2014 °Elsevier Interactive Patient Education ©2016 Elsevier Inc. ° °Community Resource Guide Dental °The United Way’s “211” is a great source of information about community services available.  Access by dialing 2-1-1 from anywhere in [place], or by website -  www.nc211.org.  ° °Other Local Resources (Updated 12/2015) ° °Dental  Care °  °Services ° °  °Phone Number and Address  °Cost  °[facility] For children 0 - 21 years of age:  °• Cleaning °• Tooth brushing/flossing instruction °• Sealants, fillings, crowns °• Extractions °• Emergency treatment  [phone] °[address] °[place], [place] [address] Charges based on family income.  Medicaid and some insurance plans accepted.   °  °[facility] [place] • Cleaning °• Sealants, fillings, crowns °• Extractions °• Emergency treatment [phone] °[address] °[place], [place] ° Pregnant women 18 years of age or older with a Medicaid card  °[facility] [place] • Cleaning °• Sealants, fillings, crowns °• Extractions °• Emergency treatment [phone] °[address] °[place], [place] Pregnant women 18 years of age or older with a   Medicaid card  °[facility] For children 0 - 21 years of age:  °• Cleaning °• Tooth brushing/flossing instruction °• Sealants, fillings, crowns °• Extractions °• Emergency treatment °Limited orthodontic services for patients with Medicaid [phone] °[address] °[place], [place] [address] Medicaid and [place] Health Choice cover for children up to age 21 and pregnant women.  Parents of children up to age 21 without Medicaid pay a  reduced fee at time of service.  °[facility] [place] For children 0 - 21 years of age:  °• Cleaning °• Tooth brushing/flossing instruction °• Sealants, fillings, crowns °• Extractions °• Emergency treatment °Limited orthodontic services for patients with Medicaid [phone] °[address] °[place], [place].  Medicaid and [place] Health Choice cover for children up to age 21 and pregnant women.  Parents of children up to age 21 without Medicaid pay a reduced fee.  °[facility] • Cleaning °• Sealants, fillings, crowns °• Extractions ° °Hours: Tuesdays and Thursdays, 4:15 - 8 pm [phone] °[address] °[place], [place] [address] Services free of charge to [place] residents ages 18-64 who do not have health insurance, Medicare, Medicaid, or VA benefits and fall within federal poverty guidelines  °[facility] ° ° ° Provides dental care in addition to primary medical care, nutritional counseling, and pharmacy: °• Cleaning °• Sealants, fillings, crowns °• Extractions ° ° ° ° ° ° ° ° ° ° ° ° ° ° ° ° ° [phone] °[facility], [address] °[place], [place] ° °[phone] °[facility], [address] [place], [place] ° °[phone] °[facility] °[place], [place] ° °[phone] °[facility], [address] °, [place] ° °[phone] °[facility] °[address] °[place], [place] Accepts Medicaid, Medicare, most insurance.  Also provides services available to all with fees adjusted based on ability to pay.    °[facility] • Cleaning °• Tooth brushing/flossing instruction °• Sealants, fillings, crowns °• Extractions °• Emergency treatment °Hours: Tuesdays, Thursdays, and Fridays from 8 am to 5 pm by appointment only. [phone] °[address] °[place], [place]  [address] [place] residents with Medicaid (depending on eligibility) and children with [place] Health Choice - call for more information.  °[facility] • Extractions only ° °Hours: 2nd and 4th Thursday of each month from 6:30 am - 9 am.   [phone] ext. [phone] °[address] °[place], [place] [address] Ages 18 and older only.  Patients are seen on a first come, first served basis.  °[facility] • Cleanings °• Fillings °• Extractions °• Orthodontics °• Endodontics °• Implants/Crowns/Bridges °• Complete and partial dentures [phone] °[place], [place] Patients must complete an application for services.  There is often a waiting list.   ° °

## 2016-03-09 NOTE — ED Notes (Signed)
Pt is c/o toothache on the right bottom molar  Pt states it has a hole in the tooth  Pt states pain started this morning

## 2016-03-17 ENCOUNTER — Ambulatory Visit (INDEPENDENT_AMBULATORY_CARE_PROVIDER_SITE_OTHER): Payer: Medicare Other | Admitting: Family Medicine

## 2016-03-17 ENCOUNTER — Other Ambulatory Visit (HOSPITAL_COMMUNITY)
Admission: RE | Admit: 2016-03-17 | Discharge: 2016-03-17 | Disposition: A | Discharge status: Home / Self Care | Payer: Medicare Other | Source: Ambulatory Visit | Attending: Family Medicine | Admitting: Family Medicine

## 2016-03-17 VITALS — BP 101/57 | HR 75 | Temp 99.4°F | Ht 67.0 in | Wt 169.7 lb

## 2016-03-17 DIAGNOSIS — B351 Tinea unguium: Secondary | ICD-10-CM | POA: Diagnosis present

## 2016-03-17 DIAGNOSIS — R896 Abnormal cytological findings in specimens from other organs, systems and tissues: Secondary | ICD-10-CM

## 2016-03-17 DIAGNOSIS — Z7251 High risk heterosexual behavior: Secondary | ICD-10-CM | POA: Diagnosis not present

## 2016-03-17 DIAGNOSIS — Z114 Encounter for screening for human immunodeficiency virus [HIV]: Secondary | ICD-10-CM | POA: Diagnosis not present

## 2016-03-17 DIAGNOSIS — Z113 Encounter for screening for infections with a predominantly sexual mode of transmission: Secondary | ICD-10-CM | POA: Diagnosis not present

## 2016-03-17 DIAGNOSIS — G43819 Other migraine, intractable, without status migrainosus: Secondary | ICD-10-CM | POA: Diagnosis not present

## 2016-03-17 DIAGNOSIS — IMO0002 Reserved for concepts with insufficient information to code with codable children: Secondary | ICD-10-CM

## 2016-03-17 DIAGNOSIS — Z1159 Encounter for screening for other viral diseases: Secondary | ICD-10-CM | POA: Diagnosis not present

## 2016-03-17 LAB — POCT WET PREP (WET MOUNT): CLUE CELLS WET PREP WHIFF POC: NEGATIVE

## 2016-03-17 NOTE — Assessment & Plan Note (Signed)
Re-refer for colposcopy clinic.

## 2016-03-17 NOTE — Progress Notes (Signed)
Subjective: Rhonda Cabrera is a 30 y.o. female presenting for STD check.  She reports having unprotected intercourse with a single female partner and would like to have STI check. No vaginal discharge, irritation, but suspects the partner has other partners. She believes he is asymptomatic as well. s/p BTL 2012.   - ROS: No fevers, abdominal pain, dysuria, change in urinary habits, or douching. Has had recurrent UTI's but denies any symptoms since they were last treated.  - Smoker ~1 ppd, precontemplative  Objective: BP 101/57 mmHg  Pulse 75  Temp(Src) 99.4 F (37.4 C) (Oral)  Ht 5\' 7"  (1.702 m)  Wt 169 lb 11.2 oz (76.975 kg)  BMI 26.57 kg/m2  LMP 03/10/2016 Gen: Well-appearing 30 y.o. female in no distress Pelvic: Normal external female genitalia without lesions. Vaginal mucosa and cervix normal without lesions, discharge or bleeding noted on speculum exam. No cervical motion tenderness.   CMA present throughout duration of exam.   Assessment/Plan: Rhonda Cabrera is a 30 y.o. female here for STI check. - Will call with results and treatment options.   ASCUS with positive high risk HPV Re-refer for colposcopy clinic.

## 2016-03-17 NOTE — Patient Instructions (Addendum)
Thank you for coming in today!  We are checking some labs today, and we'll call you if they are abnormal and send a letter within 2 weeks.  You MUST schedule an appointment in colposcopy (colpo) clinic on your way out today.   Our clinic's number is [phone]. Feel free to call any time with questions or concerns. We will answer any questions after hours with our 24-hour emergency line at that number as well.   - Dr. Bonner Puna

## 2016-03-18 LAB — RPR

## 2016-03-18 LAB — HIV ANTIBODY (ROUTINE TESTING W REFLEX): HIV 1&2 Ab, 4th Generation: NONREACTIVE

## 2016-03-21 LAB — CERVICOVAGINAL ANCILLARY ONLY
CHLAMYDIA, DNA PROBE: NEGATIVE
Neisseria Gonorrhea: NEGATIVE

## 2016-03-22 ENCOUNTER — Telehealth: Payer: Self-pay | Admitting: Family Medicine

## 2016-03-22 ENCOUNTER — Encounter: Payer: Self-pay | Admitting: Family Medicine

## 2016-03-22 NOTE — Telephone Encounter (Signed)
Attempted call to notify of negative results. Will have clinic staff try again tomorrow.

## 2016-03-23 NOTE — Telephone Encounter (Signed)
Called but mailbox is full so can't leave a message. If pt calls, please inform her of below or have her speak with [place], [place]

## 2016-04-25 ENCOUNTER — Encounter: Payer: Self-pay | Admitting: Family Medicine

## 2016-04-25 ENCOUNTER — Ambulatory Visit (INDEPENDENT_AMBULATORY_CARE_PROVIDER_SITE_OTHER): Payer: Medicare Other | Admitting: Family Medicine

## 2016-04-25 ENCOUNTER — Other Ambulatory Visit (HOSPITAL_COMMUNITY)
Admission: RE | Admit: 2016-04-25 | Discharge: 2016-04-25 | Disposition: A | Discharge status: Home / Self Care | Payer: Medicare Other | Source: Ambulatory Visit | Attending: Family Medicine | Admitting: Family Medicine

## 2016-04-25 VITALS — BP 104/64 | HR 68 | Temp 98.3°F | Ht 67.0 in | Wt 168.0 lb

## 2016-04-25 DIAGNOSIS — Z114 Encounter for screening for human immunodeficiency virus [HIV]: Secondary | ICD-10-CM | POA: Diagnosis not present

## 2016-04-25 DIAGNOSIS — Z7251 High risk heterosexual behavior: Secondary | ICD-10-CM | POA: Diagnosis not present

## 2016-04-25 DIAGNOSIS — G43819 Other migraine, intractable, without status migrainosus: Secondary | ICD-10-CM | POA: Diagnosis not present

## 2016-04-25 DIAGNOSIS — N760 Acute vaginitis: Secondary | ICD-10-CM

## 2016-04-25 DIAGNOSIS — B351 Tinea unguium: Secondary | ICD-10-CM | POA: Diagnosis present

## 2016-04-25 DIAGNOSIS — N898 Other specified noninflammatory disorders of vagina: Secondary | ICD-10-CM

## 2016-04-25 DIAGNOSIS — A499 Bacterial infection, unspecified: Secondary | ICD-10-CM

## 2016-04-25 DIAGNOSIS — Z113 Encounter for screening for infections with a predominantly sexual mode of transmission: Secondary | ICD-10-CM | POA: Diagnosis not present

## 2016-04-25 DIAGNOSIS — B9689 Other specified bacterial agents as the cause of diseases classified elsewhere: Secondary | ICD-10-CM

## 2016-04-25 DIAGNOSIS — Z1159 Encounter for screening for other viral diseases: Secondary | ICD-10-CM | POA: Diagnosis not present

## 2016-04-25 LAB — POCT WET PREP (WET MOUNT): Clue Cells Wet Prep Whiff POC: POSITIVE

## 2016-04-25 LAB — HEPATIC FUNCTION PANEL
ALBUMIN: 4.2 g/dL (ref 3.6–5.1)
ALK PHOS: 68 U/L (ref 33–115)
ALT: 9 U/L (ref 6–29)
AST: 17 U/L (ref 10–30)
BILIRUBIN INDIRECT: 0.2 mg/dL (ref 0.2–1.2)
BILIRUBIN TOTAL: 0.3 mg/dL (ref 0.2–1.2)
Bilirubin, Direct: 0.1 mg/dL (ref <=0.2)
Total Protein: 6.6 g/dL (ref 6.1–8.1)

## 2016-04-25 LAB — POCT URINE PREGNANCY: Preg Test, Ur: NEGATIVE

## 2016-04-25 MED ORDER — METRONIDAZOLE 500 MG PO TABS: 500.0000 mg | ORAL_TABLET | Freq: Two times a day (BID) | ORAL | Status: DC

## 2016-04-25 NOTE — Patient Instructions (Signed)

## 2016-04-25 NOTE — Progress Notes (Signed)
   VAGINAL DISCHARGE Patient reports new vaginal discharge over the past 2 days. She states that she has had 2 new partners over the past month. She endorses barrier protection but does state that with each partner she has had at least one episode in which barrier protection was not used.  Having vaginal discharge for 2 days. Discharge is: White and thick Sex in last month: Yes with multiple partners Using barrier protection (condoms): Occasionally Possible STD exposure: Yes Personal history of vaginal infection: Yes Family history of uterine or vaginal cancer: No Recent antibiotic use: Yes, for a dental procedure  Symptoms Vaginal itching: Yes Dysuria: No Dyspareunia: Yes Genital sores or ulcers: No Hematuria: No Flank pain: No Weight loss: No Weight gain: No Trouble with vision: No Headaches: No Abdomen or pelvic pain: No Back pain: No  Review of Systems   See HPI for ROS. All other systems reviewed and are negative.  CC, SH/smoking status, and VS noted  Objective: BP 104/64 mmHg  Pulse 68  Temp(Src) 98.3 F (36.8 C) (Oral)  Ht 5\' 7"  (1.702 m)  Wt 168 lb (76.204 kg)  BMI 26.31 kg/m2  SpO2 96%  LMP 04/12/2016 Gen: NAD, alert, cooperative, and pleasant. CV: RRR, no murmur Resp: CTAB, no wheezes, non-labored Abd: SNTND, BS present, no guarding or organomegaly Female genitalia: normal external genitalia, vulva, vagina, cervix, uterus and adnexa Vagina: white malodorous discharge noted. Otherwise normal in appearance.   Assessment and plan:  BV (bacterial vaginosis) Patient here with complaints of new vaginal discharge. Patient's history is significant for risky sexual behavior. She was here just last month with similar chief complaint. Wet prep yielded clue cells with positive whiff test. Physical exam yielded malodorous white discharge. Findings most consistent with bacterial vaginosis. - Metronidazole.  Risky sexual behavior Patient endorses risky sexual  behavior with at least 2 partners over the past 1 month in which both partners did not always use protection. Patient is insisting on full STD workup - Obtain labs: HIV, RPR, hepatitis panel, GC chlamydia    Orders Placed This Encounter  Procedures  . HIV antibody (with reflex)  . RPR  . Hepatic Function Panel  . POCT Wet Prep [employer])  . POCT urine pregnancy    Meds ordered this encounter  Medications  . metroNIDAZOLE (FLAGYL) 500 MG tablet    Sig: Take 1 tablet (500 mg total) by mouth 2 (two) times daily. For 7 days. Do not drink alcohol while taking this medication.    Dispense:  14 tablet    Refill:  0     Elberta Leatherwood, MD,MS,  PGY2 04/26/2016 12:43 AM

## 2016-04-26 DIAGNOSIS — Z7251 High risk heterosexual behavior: Secondary | ICD-10-CM | POA: Insufficient documentation

## 2016-04-26 DIAGNOSIS — B9689 Other specified bacterial agents as the cause of diseases classified elsewhere: Secondary | ICD-10-CM | POA: Insufficient documentation

## 2016-04-26 DIAGNOSIS — N760 Acute vaginitis: Secondary | ICD-10-CM

## 2016-04-26 LAB — HIV ANTIBODY (ROUTINE TESTING W REFLEX): HIV 1&2 Ab, 4th Generation: NONREACTIVE

## 2016-04-26 LAB — CERVICOVAGINAL ANCILLARY ONLY
Chlamydia: NEGATIVE
NEISSERIA GONORRHEA: NEGATIVE

## 2016-04-26 LAB — RPR

## 2016-04-26 NOTE — Assessment & Plan Note (Signed)
Patient endorses risky sexual behavior with at least 2 partners over the past 1 month in which both partners did not always use protection. Patient is insisting on full STD workup - Obtain labs: HIV, RPR, hepatitis panel, GC chlamydia

## 2016-04-26 NOTE — Assessment & Plan Note (Signed)
Patient here with complaints of new vaginal discharge. Patient's history is significant for risky sexual behavior. She was here just last month with similar chief complaint. Wet prep yielded clue cells with positive whiff test. Physical exam yielded malodorous white discharge. Findings most consistent with bacterial vaginosis. - Metronidazole.

## 2016-04-28 ENCOUNTER — Telehealth: Payer: Self-pay | Admitting: *Deleted

## 2016-04-28 NOTE — Telephone Encounter (Signed)
Patient called requesting results of her labs from 04-25-16.  Informed her of negative results and patient was already aware of her wet prep results.  Results printed per her request and placed up front for pick up. Vearl Aitken,CMA

## 2016-05-17 ENCOUNTER — Encounter (HOSPITAL_COMMUNITY): Payer: Self-pay | Admitting: *Deleted

## 2016-05-17 ENCOUNTER — Ambulatory Visit (HOSPITAL_COMMUNITY)
Admission: EM | Admit: 2016-05-17 | Discharge: 2016-05-17 | Disposition: A | Discharge status: Home / Self Care | Payer: Medicare Other | Attending: Emergency Medicine | Admitting: Emergency Medicine

## 2016-05-17 DIAGNOSIS — S0501XA Injury of conjunctiva and corneal abrasion without foreign body, right eye, initial encounter: Secondary | ICD-10-CM

## 2016-05-17 MED ORDER — HYDROCODONE-ACETAMINOPHEN 5-325 MG PO TABS
ORAL_TABLET | ORAL | Status: AC
  Filled 2016-05-17: qty 1

## 2016-05-17 MED ORDER — HYDROCODONE-ACETAMINOPHEN 5-325 MG PO TABS
1.0000 | ORAL_TABLET | Freq: Once | ORAL | Status: AC
  Administered 2016-05-17: 1 via ORAL

## 2016-05-17 MED ORDER — POLYMYXIN B-TRIMETHOPRIM 10000-0.1 UNIT/ML-% OP SOLN: 1.0000 [drp] | OPHTHALMIC | Status: DC

## 2016-05-17 MED ORDER — HYDROCODONE-ACETAMINOPHEN 7.5-325 MG PO TABS: 1.0000 | ORAL_TABLET | ORAL | Status: DC | PRN

## 2016-05-17 NOTE — ED Notes (Signed)
Pt  Reports  Pain   And   Irritation  Of  r   Eye  That  She  Noticed   This   Am              she  denys   Any  specefic  Injury     Reports  The  Eye  Is  Irritated    Pt  Has  A  Female  Visitor  With  Her  At  This time   She   Was  Able  To  Ambulate   To  The  Room

## 2016-05-17 NOTE — ED Provider Notes (Signed)
CSN: [id number]     Arrival date & time 05/17/16  1538 History   First MD Initiated Contact with Patient 05/17/16 1559     Chief Complaint  Patient presents with  . Eye Problem   (Consider location/radiation/quality/duration/timing/severity/associated sxs/prior Treatment) HPI Comments: 30 year old female complaining of right eye pain since awakening this morning. She states it got worse this afternoon. No known trauma. She just awoke this morning and was having pain. The significant other with her states that she has a history of poking herself in the eye with her long fingernails. Denies other trauma. She feels as though there is a foreign body in her eye.   Past Medical History  Diagnosis Date  . Bipolar 1 disorder ([place])   . Opiate addiction ([place])   . Abnormal Pap smear   . Depression   . Polysubstance abuse   . Hypertension   . Anxiety   . Chlamydia 07/12/2012  . Headache    Past Surgical History  Procedure Laterality Date  . Vaginal deliveries    . Tubal ligation  09/08/2011    Procedure: POST PARTUM TUBAL LIGATION;  Surgeon: Emeterio Reeve, MD;  Location: [facility];  Service: Gynecology;  Laterality: Bilateral;  . Cholecystectomy  11/04/2012    Procedure: LAPAROSCOPIC CHOLECYSTECTOMY WITH INTRAOPERATIVE CHOLANGIOGRAM;  Surgeon: Imogene Burn. Georgette Dover, MD;  Location: [facility];  Service: General;  Laterality: N/A;   Family History  Problem Relation Age of Onset  . Drug abuse Mother   . Drug abuse Father   . Diabetes Maternal Grandmother   . Hypertension Maternal Grandmother   . Diabetes Paternal Grandmother   . Diabetes Paternal Grandfather    Social History  Substance Use Topics  . Smoking status: Current Every Day Smoker -- 1.00 packs/day    Types: Cigarettes  . Smokeless tobacco: Never Used  . Alcohol Use: No   OB History    Gravida Para Term Preterm AB TAB SAB Ectopic Multiple Living   7 6 3 3 1 1   1 6      Review of Systems  Constitutional: Negative for fever and  activity change.  HENT: Negative.   Eyes: Positive for pain, redness and visual disturbance.  Respiratory: Negative.   Skin: Negative.   Neurological: Negative.     Allergies  Review of patient's allergies indicates no known allergies.  Home Medications   Prior to Admission medications   Medication Sig Start Date End Date Taking? Authorizing Provider  gabapentin (NEURONTIN) 600 MG tablet Take 600 mg by mouth 3 (three) times daily.    Historical Provider, MD  HYDROcodone-acetaminophen (NORCO) 7.5-325 MG tablet Take 1 tablet by mouth every 4 (four) hours as needed. 05/17/16   Janne Napoleon, NP  ibuprofen (ADVIL,MOTRIN) 600 MG tablet Take 1 tablet (600 mg total) by mouth every 8 (eight) hours as needed. 03/09/16   Jola Schmidt, MD  methadone (METHADOSE) 40 MG disintegrating tablet Take 40 mg by mouth every 6 (six) hours as needed.    Historical Provider, MD  metroNIDAZOLE (FLAGYL) 500 MG tablet Take 1 tablet (500 mg total) by mouth 2 (two) times daily. For 7 days. Do not drink alcohol while taking this medication. 04/25/16   Elberta Leatherwood, MD  naproxen sodium (ANAPROX) 220 MG tablet Take 440 mg by mouth 2 (two) times daily as needed (for headaches.).    Historical Provider, MD  pantoprazole (PROTONIX) 40 MG tablet Take 1 tablet (40 mg total) by mouth daily. 04/29/15   Nolon Rod, DO  phenazopyridine (PYRIDIUM) 200 MG tablet Take 1 tablet (200 mg total) by mouth 3 (three) times daily. 10/05/15   Tishira R Brewington, PA-C  QUEtiapine (SEROQUEL) 300 MG tablet Take 300 mg by mouth at bedtime.    Historical Provider, MD  SUMAtriptan (IMITREX) 100 MG tablet Take 1tab at earliest onset of headache.  May repeat x1 in 2 hours if headache persists or recurs.  Do not exceed 2 tablets in 24 hours. 03/24/15   Pieter Partridge, DO  terbinafine (LAMISIL) 250 MG tablet Take 1 tablet (250 mg total) by mouth daily. 04/29/15   Nolon Rod, DO  trimethoprim-polymyxin b (POLYTRIM) ophthalmic solution Place 1 drop into the  right eye every 4 (four) hours. 05/17/16   Janne Napoleon, NP  venlafaxine XR (EFFEXOR-XR) 37.5 MG 24 hr capsule Take 1cap at bedtime x7d, then 2caps at bedtime 03/24/15   Pieter Partridge, DO   Meds Ordered and Administered this Visit   Medications  HYDROcodone-acetaminophen (NORCO/VICODIN) 5-325 MG per tablet 1 tablet (not administered)    BP 116/75 mmHg  Pulse 62  Temp(Src) 98.4 F (36.9 C) (Oral)  Resp 16  SpO2 100%  LMP 05/07/2016 No data found.   Physical Exam  Constitutional: She is oriented to person, place, and time. She appears well-developed and well-nourished. No distress.  Eyes: EOM are normal. Pupils are equal, round, and reactive to light.    Minor erythema to the lower conjunctiva. Sclera mildly injected. Anterior chamber is clear. No obvious defects or foreign bodies under magnification. After 2 drops of tetracaine to the right eye forcing stain was applied. I was examined under blue light. There is a transverse defect with uptake are his only across the lower portion of the cornea. It does not cross over the pupil. Lid was everted. No foreign bodies were seen. Eye was then irrigated with eyewash.  Neck: Normal range of motion. Neck supple.  Cardiovascular: Normal rate.   Pulmonary/Chest: Effort normal.  Neurological: She is alert and oriented to person, place, and time.  Skin: Skin is warm and dry.  Psychiatric: Her mood appears anxious. Her affect is blunt. Her speech is rapid and/or pressured. She is agitated and hyperactive.  Nursing note and vitals reviewed.   ED Course  Procedures (including critical care time)  Labs Review Labs Reviewed - No data to display  Imaging Review No results found.   Visual Acuity Review  Right Eye Distance:   Left Eye Distance:   Bilateral Distance:    Right Eye Near:   Left Eye Near:    Bilateral Near:         MDM   1. Corneal abrasion, right, initial encounter    Meds ordered this encounter  Medications  .  HYDROcodone-acetaminophen (NORCO/VICODIN) 5-325 MG per tablet 1 tablet    Sig:   . HYDROcodone-acetaminophen (NORCO) 7.5-325 MG tablet    Sig: Take 1 tablet by mouth every 4 (four) hours as needed.    Dispense:  10 tablet    Refill:  0    Order Specific Question:  Supervising Provider    Answer:  Melony Overly [id number]  . trimethoprim-polymyxin b (POLYTRIM) ophthalmic solution    Sig: Place 1 drop into the right eye every 4 (four) hours.    Dispense:  10 mL    Refill:  0    Order Specific Question:  Supervising Provider    Answer:  Melony Overly [id number]   Call the opthal listed on p.1 if  not improved or for problems    Janne Napoleon, NP 05/17/16 1645

## 2016-05-17 NOTE — Discharge Instructions (Signed)

## 2016-05-29 ENCOUNTER — Encounter (HOSPITAL_COMMUNITY): Payer: Self-pay | Admitting: Emergency Medicine

## 2016-05-29 ENCOUNTER — Ambulatory Visit: Payer: Medicare Other | Admitting: Family Medicine

## 2016-05-29 ENCOUNTER — Ambulatory Visit (HOSPITAL_COMMUNITY)
Admission: EM | Admit: 2016-05-29 | Discharge: 2016-05-29 | Disposition: A | Discharge status: Home / Self Care | Payer: Medicare Other | Attending: Family Medicine | Admitting: Family Medicine

## 2016-05-29 DIAGNOSIS — N390 Urinary tract infection, site not specified: Secondary | ICD-10-CM | POA: Insufficient documentation

## 2016-05-29 LAB — POCT URINALYSIS DIP (DEVICE)
Bilirubin Urine: NEGATIVE
GLUCOSE, UA: NEGATIVE mg/dL
NITRITE: NEGATIVE
PH: 8.5 — AB (ref 5.0–8.0)
PROTEIN: 100 mg/dL — AB
Specific Gravity, Urine: 1.025 (ref 1.005–1.030)
UROBILINOGEN UA: 4 mg/dL — AB (ref 0.0–1.0)

## 2016-05-29 MED ORDER — CEPHALEXIN 500 MG PO CAPS: 500.0000 mg | ORAL_CAPSULE | Freq: Four times a day (QID) | ORAL | Status: DC

## 2016-05-29 MED ORDER — PHENAZOPYRIDINE HCL 200 MG PO TABS: 200.0000 mg | ORAL_TABLET | Freq: Three times a day (TID) | ORAL | Status: DC

## 2016-05-29 NOTE — Discharge Instructions (Signed)

## 2016-05-29 NOTE — ED Notes (Signed)
Seen by frank patrick, pa only.  Symptoms 2-3 days

## 2016-05-30 LAB — URINE CYTOLOGY ANCILLARY ONLY
CHLAMYDIA, DNA PROBE: NEGATIVE
Neisseria Gonorrhea: NEGATIVE
TRICH (WINDOWPATH): NEGATIVE

## 2016-05-30 NOTE — ED Provider Notes (Signed)
CSN: [id number]     Arrival date & time 05/29/16  1943 History   First MD Initiated Contact with Patient 05/29/16 2031     Chief Complaint  Patient presents with  . Urinary Tract Infection   (Consider location/radiation/quality/duration/timing/severity/associated sxs/prior Treatment) HPI  History obtained from patient:   I have a urinary tract infection    Today  began to have frequency of urination 5-6 times per day, urine burns when "it hits the skin," occasional vaginal itching.  No relief from increased fluids, cranberry juice. Denies fever/chills, recent history of STD States she had had a partner recently that she did not use condoms. But no vaginal discharge     Past Medical History  Diagnosis Date  . Bipolar 1 disorder ([place])   . Opiate addiction ([place])   . Abnormal Pap smear   . Depression   . Polysubstance abuse   . Hypertension   . Anxiety   . Chlamydia 07/12/2012  . Headache    Past Surgical History  Procedure Laterality Date  . Vaginal deliveries    . Tubal ligation  09/08/2011    Procedure: POST PARTUM TUBAL LIGATION;  Surgeon: Emeterio Reeve, MD;  Location: [facility];  Service: Gynecology;  Laterality: Bilateral;  . Cholecystectomy  11/04/2012    Procedure: LAPAROSCOPIC CHOLECYSTECTOMY WITH INTRAOPERATIVE CHOLANGIOGRAM;  Surgeon: Imogene Burn. Georgette Dover, MD;  Location: [facility];  Service: General;  Laterality: N/A;   Family History  Problem Relation Age of Onset  . Drug abuse Mother   . Drug abuse Father   . Diabetes Maternal Grandmother   . Hypertension Maternal Grandmother   . Diabetes Paternal Grandmother   . Diabetes Paternal Grandfather    Social History  Substance Use Topics  . Smoking status: Current Every Day Smoker -- 1.00 packs/day    Types: Cigarettes  . Smokeless tobacco: Never Used  . Alcohol Use: No   OB History    Gravida Para Term Preterm AB TAB SAB Ectopic Multiple Living   7 6 3 3 1 1   1 6      Review of Systems  Denies: HEADACHE, NAUSEA,  ABDOMINAL PAIN, CHEST PAIN, CONGESTION, DYSURIA, SHORTNESS OF BREATH  Allergies  Review of patient's allergies indicates no known allergies.  Home Medications   Prior to Admission medications   Medication Sig Start Date End Date Taking? Authorizing Provider  cephALEXin (KEFLEX) 500 MG capsule Take 1 capsule (500 mg total) by mouth 4 (four) times daily. 05/29/16   Konrad Felix, PA  gabapentin (NEURONTIN) 600 MG tablet Take 600 mg by mouth 3 (three) times daily.    Historical Provider, MD  HYDROcodone-acetaminophen (NORCO) 7.5-325 MG tablet Take 1 tablet by mouth every 4 (four) hours as needed. 05/17/16   Janne Napoleon, NP  ibuprofen (ADVIL,MOTRIN) 600 MG tablet Take 1 tablet (600 mg total) by mouth every 8 (eight) hours as needed. 03/09/16   Jola Schmidt, MD  methadone (METHADOSE) 40 MG disintegrating tablet Take 40 mg by mouth every 6 (six) hours as needed.    Historical Provider, MD  metroNIDAZOLE (FLAGYL) 500 MG tablet Take 1 tablet (500 mg total) by mouth 2 (two) times daily. For 7 days. Do not drink alcohol while taking this medication. 04/25/16   Elberta Leatherwood, MD  naproxen sodium (ANAPROX) 220 MG tablet Take 440 mg by mouth 2 (two) times daily as needed (for headaches.).    Historical Provider, MD  pantoprazole (PROTONIX) 40 MG tablet Take 1 tablet (40 mg total) by mouth  daily. 04/29/15   Nolon Rod, DO  phenazopyridine (PYRIDIUM) 200 MG tablet Take 1 tablet (200 mg total) by mouth 3 (three) times daily. 10/05/15   Tishira R Brewington, PA-C  phenazopyridine (PYRIDIUM) 200 MG tablet Take 1 tablet (200 mg total) by mouth 3 (three) times daily. 05/29/16   Konrad Felix, PA  QUEtiapine (SEROQUEL) 300 MG tablet Take 300 mg by mouth at bedtime.    Historical Provider, MD  SUMAtriptan (IMITREX) 100 MG tablet Take 1tab at earliest onset of headache.  May repeat x1 in 2 hours if headache persists or recurs.  Do not exceed 2 tablets in 24 hours. 03/24/15   Pieter Partridge, DO  terbinafine (LAMISIL) 250 MG  tablet Take 1 tablet (250 mg total) by mouth daily. 04/29/15   Nolon Rod, DO  trimethoprim-polymyxin b (POLYTRIM) ophthalmic solution Place 1 drop into the right eye every 4 (four) hours. 05/17/16   Janne Napoleon, NP  venlafaxine XR (EFFEXOR-XR) 37.5 MG 24 hr capsule Take 1cap at bedtime x7d, then 2caps at bedtime 03/24/15   Pieter Partridge, DO   Meds Ordered and Administered this Visit  Medications - No data to display  BP 120/78 mmHg  Pulse 77  Temp(Src) 98.7 F (37.1 C) (Oral)  SpO2 100%  LMP 05/07/2016 No data found.   Physical Exam NURSES NOTES AND VITAL SIGNS REVIEWED. CONSTITUTIONAL: Well developed, well nourished, no acute distress HEENT: normocephalic, atraumatic EYES: Conjunctiva normal NECK:normal ROM, supple, no adenopathy PULMONARY:No respiratory distress, normal effort ABDOMINAL: Soft, ND, NT BS+, No CVAT MUSCULOSKELETAL: Normal ROM of all extremities,  SKIN: warm and dry without rash PSYCHIATRIC: Mood and affect, behavior are normal   ED Course  Procedures (including critical care time)  Labs Review Labs Reviewed  POCT URINALYSIS DIP (DEVICE) - Abnormal; Notable for the following:    Ketones, ur TRACE (*)    Hgb urine dipstick TRACE (*)    pH 8.5 (*)    Protein, ur 100 (*)    Urobilinogen, UA 4.0 (*)    Leukocytes, UA MODERATE (*)    All other components within normal limits  URINE CYTOLOGY ANCILLARY ONLY    Imaging Review No results found.   Visual Acuity Review  Right Eye Distance:   Left Eye Distance:   Bilateral Distance:    Right Eye Near:   Left Eye Near:    Bilateral Near:      RX for keflex is provided. Pt would like full compliment of STD checks done. HIV status was checked just a few weeks ago. Does not want syphilis blood test.     MDM   1. UTI (lower urinary tract infection)     Patient is reassured that there are no issues that require transfer to higher level of care at this time or additional tests. Patient is advised to  continue home symptomatic treatment. Patient is advised that if there are new or worsening symptoms to attend the emergency department, contact primary care provider, or return to UC. Instructions of care provided discharged home in stable condition.    THIS NOTE WAS GENERATED USING A VOICE RECOGNITION SOFTWARE PROGRAM. ALL REASONABLE EFFORTS  WERE MADE TO PROOFREAD THIS DOCUMENT FOR ACCURACY.  I have verbally reviewed the discharge instructions with the patient. A printed AVS was given to the patient.  All questions were answered prior to discharge.      Konrad Felix, [place] 05/30/16 [phone]

## 2016-06-01 LAB — URINE CYTOLOGY ANCILLARY ONLY: BACTERIAL VAGINITIS: NEGATIVE

## 2016-06-13 ENCOUNTER — Encounter (HOSPITAL_COMMUNITY): Payer: Self-pay | Admitting: Emergency Medicine

## 2016-06-13 ENCOUNTER — Ambulatory Visit (HOSPITAL_COMMUNITY)
Admission: EM | Admit: 2016-06-13 | Discharge: 2016-06-13 | Disposition: A | Discharge status: Home / Self Care | Payer: Medicare Other | Attending: Family Medicine | Admitting: Family Medicine

## 2016-06-13 DIAGNOSIS — F17210 Nicotine dependence, cigarettes, uncomplicated: Secondary | ICD-10-CM | POA: Insufficient documentation

## 2016-06-13 DIAGNOSIS — I10 Essential (primary) hypertension: Secondary | ICD-10-CM | POA: Insufficient documentation

## 2016-06-13 DIAGNOSIS — N760 Acute vaginitis: Secondary | ICD-10-CM

## 2016-06-13 DIAGNOSIS — Z9049 Acquired absence of other specified parts of digestive tract: Secondary | ICD-10-CM | POA: Diagnosis not present

## 2016-06-13 DIAGNOSIS — Z9889 Other specified postprocedural states: Secondary | ICD-10-CM | POA: Diagnosis not present

## 2016-06-13 DIAGNOSIS — N898 Other specified noninflammatory disorders of vagina: Secondary | ICD-10-CM | POA: Diagnosis not present

## 2016-06-13 DIAGNOSIS — F319 Bipolar disorder, unspecified: Secondary | ICD-10-CM | POA: Diagnosis not present

## 2016-06-13 DIAGNOSIS — Z79899 Other long term (current) drug therapy: Secondary | ICD-10-CM | POA: Insufficient documentation

## 2016-06-13 DIAGNOSIS — F419 Anxiety disorder, unspecified: Secondary | ICD-10-CM | POA: Insufficient documentation

## 2016-06-13 LAB — POCT URINALYSIS DIP (DEVICE)
Glucose, UA: NEGATIVE mg/dL
Hgb urine dipstick: NEGATIVE
Ketones, ur: NEGATIVE mg/dL
Leukocytes, UA: NEGATIVE
Nitrite: NEGATIVE
PH: 6 (ref 5.0–8.0)
Protein, ur: 30 mg/dL — AB
UROBILINOGEN UA: 1 mg/dL (ref 0.0–1.0)

## 2016-06-13 LAB — POCT PREGNANCY, URINE: Preg Test, Ur: NEGATIVE

## 2016-06-13 MED ORDER — METRONIDAZOLE 500 MG PO TABS: 500.0000 mg | ORAL_TABLET | Freq: Two times a day (BID) | ORAL | Status: DC

## 2016-06-13 NOTE — Discharge Instructions (Signed)
We will call with test results and treat as indicated °

## 2016-06-13 NOTE — ED Provider Notes (Signed)
CSN: [id number]     Arrival date & time 06/13/16  1828 History   First MD Initiated Contact with Patient 06/13/16 1858     Chief Complaint  Patient presents with  . Vaginal Discharge   (Consider location/radiation/quality/duration/timing/severity/associated sxs/prior Treatment) Patient is a 30 y.o. female presenting with vaginal discharge. The history is provided by the patient.  Vaginal Discharge Quality:  Malodorous Severity:  Mild Onset quality:  Gradual Duration:  2 days Progression:  Unchanged Chronicity:  New Context: spontaneously   Worsened by:  Nothing tried Ineffective treatments:  None tried Associated symptoms: no abdominal pain, no dysuria, no nausea, no vaginal itching and no vomiting   Risk factors: new sexual partner     Past Medical History  Diagnosis Date  . Bipolar 1 disorder ([place])   . Opiate addiction ([place])   . Abnormal Pap smear   . Depression   . Polysubstance abuse   . Hypertension   . Anxiety   . Chlamydia 07/12/2012  . Headache    Past Surgical History  Procedure Laterality Date  . Vaginal deliveries    . Tubal ligation  09/08/2011    Procedure: POST PARTUM TUBAL LIGATION;  Surgeon: Emeterio Reeve, MD;  Location: [facility];  Service: Gynecology;  Laterality: Bilateral;  . Cholecystectomy  11/04/2012    Procedure: LAPAROSCOPIC CHOLECYSTECTOMY WITH INTRAOPERATIVE CHOLANGIOGRAM;  Surgeon: Imogene Burn. Georgette Dover, MD;  Location: [facility];  Service: General;  Laterality: N/A;   Family History  Problem Relation Age of Onset  . Drug abuse Mother   . Drug abuse Father   . Diabetes Maternal Grandmother   . Hypertension Maternal Grandmother   . Diabetes Paternal Grandmother   . Diabetes Paternal Grandfather    Social History  Substance Use Topics  . Smoking status: Current Every Day Smoker -- 1.00 packs/day    Types: Cigarettes  . Smokeless tobacco: Never Used  . Alcohol Use: No   OB History    Gravida Para Term Preterm AB TAB SAB Ectopic Multiple Living   7 6 3 3 1 1   1 6      Review of Systems  Constitutional: Negative.   Gastrointestinal: Negative.  Negative for nausea, vomiting and abdominal pain.  Genitourinary: Positive for vaginal discharge. Negative for dysuria, urgency, vaginal bleeding, vaginal pain and pelvic pain.  All other systems reviewed and are negative.   Allergies  Review of patient's allergies indicates no known allergies.  Home Medications   Prior to Admission medications   Medication Sig Start Date End Date Taking? Authorizing Provider  gabapentin (NEURONTIN) 600 MG tablet Take 600 mg by mouth 3 (three) times daily.   Yes Historical Provider, MD  methadone (METHADOSE) 40 MG disintegrating tablet Take 40 mg by mouth every 6 (six) hours as needed.   Yes Historical Provider, MD  QUEtiapine (SEROQUEL) 300 MG tablet Take 300 mg by mouth at bedtime.   Yes Historical Provider, MD  cephALEXin (KEFLEX) 500 MG capsule Take 1 capsule (500 mg total) by mouth 4 (four) times daily. 05/29/16   Konrad Felix, PA  HYDROcodone-acetaminophen (NORCO) 7.5-325 MG tablet Take 1 tablet by mouth every 4 (four) hours as needed. 05/17/16   Janne Napoleon, NP  ibuprofen (ADVIL,MOTRIN) 600 MG tablet Take 1 tablet (600 mg total) by mouth every 8 (eight) hours as needed. 03/09/16   Jola Schmidt, MD  metroNIDAZOLE (FLAGYL) 500 MG tablet Take 1 tablet (500 mg total) by mouth 2 (two) times daily. 06/13/16   Billy Fischer, MD  naproxen sodium (ANAPROX) 220 MG tablet Take 440 mg by mouth 2 (two) times daily as needed (for headaches.).    Historical Provider, MD  pantoprazole (PROTONIX) 40 MG tablet Take 1 tablet (40 mg total) by mouth daily. 04/29/15   Nolon Rod, DO  phenazopyridine (PYRIDIUM) 200 MG tablet Take 1 tablet (200 mg total) by mouth 3 (three) times daily. 10/05/15   Tishira R Brewington, PA-C  phenazopyridine (PYRIDIUM) 200 MG tablet Take 1 tablet (200 mg total) by mouth 3 (three) times daily. 05/29/16   Konrad Felix, PA  SUMAtriptan (IMITREX)  100 MG tablet Take 1tab at earliest onset of headache.  May repeat x1 in 2 hours if headache persists or recurs.  Do not exceed 2 tablets in 24 hours. 03/24/15   Pieter Partridge, DO  terbinafine (LAMISIL) 250 MG tablet Take 1 tablet (250 mg total) by mouth daily. 04/29/15   Nolon Rod, DO  trimethoprim-polymyxin b (POLYTRIM) ophthalmic solution Place 1 drop into the right eye every 4 (four) hours. 05/17/16   Janne Napoleon, NP  venlafaxine XR (EFFEXOR-XR) 37.5 MG 24 hr capsule Take 1cap at bedtime x7d, then 2caps at bedtime 03/24/15   Pieter Partridge, DO   Meds Ordered and Administered this Visit  Medications - No data to display  BP 102/61 mmHg  Pulse 60  Temp(Src) 98.2 F (36.8 C) (Oral)  Resp 14  SpO2 100%  LMP 06/02/2016 (Exact Date) No data found.   Physical Exam  Constitutional: She is oriented to person, place, and time. She appears well-developed and well-nourished. No distress.  Abdominal: Bowel sounds are normal. She exhibits no distension and no mass. There is no tenderness. There is no rebound and no guarding.  Genitourinary: Vagina normal. No vaginal discharge found.  Neurological: She is alert and oriented to person, place, and time.  Skin: Skin is warm and dry.  Nursing note and vitals reviewed.   ED Course  Procedures (including critical care time)  Labs Review Labs Reviewed  POCT URINALYSIS DIP (DEVICE) - Abnormal; Notable for the following:    Bilirubin Urine MODERATE (*)    Protein, ur 30 (*)    All other components within normal limits  POCT PREGNANCY, URINE    Imaging Review No results found.   Visual Acuity Review  Right Eye Distance:   Left Eye Distance:   Bilateral Distance:    Right Eye Near:   Left Eye Near:    Bilateral Near:         MDM   1. Vaginitis        Billy Fischer, MD 06/13/16 [id number]

## 2016-06-13 NOTE — ED Notes (Signed)
Pt reports a white, thick vaginal discharge and odor since yesterday.

## 2016-06-14 LAB — CERVICOVAGINAL ANCILLARY ONLY
Chlamydia: NEGATIVE
Neisseria Gonorrhea: NEGATIVE
Wet Prep (BD Affirm): POSITIVE — AB

## 2016-06-15 ENCOUNTER — Ambulatory Visit: Payer: Medicare Other

## 2016-06-26 ENCOUNTER — Other Ambulatory Visit (HOSPITAL_COMMUNITY)
Admission: RE | Admit: 2016-06-26 | Discharge: 2016-06-26 | Disposition: A | Discharge status: Home / Self Care | Payer: Medicare Other | Source: Ambulatory Visit | Attending: Family Medicine | Admitting: Family Medicine

## 2016-06-26 ENCOUNTER — Ambulatory Visit (INDEPENDENT_AMBULATORY_CARE_PROVIDER_SITE_OTHER): Payer: Medicare Other | Admitting: Family Medicine

## 2016-06-26 VITALS — BP 118/74 | HR 72 | Temp 98.5°F | Wt 157.0 lb

## 2016-06-26 DIAGNOSIS — B351 Tinea unguium: Secondary | ICD-10-CM | POA: Diagnosis present

## 2016-06-26 DIAGNOSIS — N898 Other specified noninflammatory disorders of vagina: Secondary | ICD-10-CM

## 2016-06-26 DIAGNOSIS — D126 Benign neoplasm of colon, unspecified: Secondary | ICD-10-CM | POA: Diagnosis not present

## 2016-06-26 DIAGNOSIS — B373 Candidiasis of vulva and vagina: Secondary | ICD-10-CM

## 2016-06-26 DIAGNOSIS — B3731 Acute candidiasis of vulva and vagina: Secondary | ICD-10-CM | POA: Insufficient documentation

## 2016-06-26 DIAGNOSIS — G43819 Other migraine, intractable, without status migrainosus: Secondary | ICD-10-CM | POA: Diagnosis not present

## 2016-06-26 LAB — POCT URINALYSIS DIPSTICK
Glucose, UA: NEGATIVE
KETONES UA: NEGATIVE
Leukocytes, UA: NEGATIVE
Nitrite, UA: NEGATIVE
PH UA: 6
PROTEIN UA: 30
RBC UA: NEGATIVE
UROBILINOGEN UA: 1

## 2016-06-26 LAB — POCT WET PREP (WET MOUNT): CLUE CELLS WET PREP WHIFF POC: NEGATIVE

## 2016-06-26 MED ORDER — FLUCONAZOLE 150 MG PO TABS: 150.0000 mg | ORAL_TABLET | Freq: Once | ORAL | Status: DC

## 2016-06-26 NOTE — Progress Notes (Signed)
VAGINAL DISCHARGE Thinks she has a yeast infection. Some vaginal itching/burning.  Having vaginal discharge for 2 days. Discharge is: thick white Sex in last month: yes Using barrier protection (condoms): no Possible STD exposure: possible Personal history of vaginal infection: yes Family history of uterine or vaginal cancer: no Recent antibiotic use: yes  Symptoms Vaginal itching: yes Dysuria: mild Dyspareunia: some Genital sores or ulcers: no Hematuria: no Flank pain: no Weight loss: no Weight gain: no Trouble with vision: no Headaches: no Abdomen or pelvic pain: some Back pain: no   ROS see HPI Smoking Status noted   CC, SH/smoking status, and VS noted  Objective: BP 118/74 mmHg  Pulse 72  Temp(Src) 98.5 F (36.9 C) (Oral)  Wt 157 lb (71.215 kg)  SpO2 98%  LMP 06/02/2016 (Exact Date) Gen: NAD, alert, cooperative, and pleasant. Female genitalia: normal external genitalia, vulva, vagina, cervix, uterus and adnexa Ext: No edema, warm Neuro: Alert and oriented, Speech clear, No gross deficits  Assessment and plan:  Candidal vaginitis Patient is here with signs and symptoms consistent with candidal vaginitis. No substantial findings on physical exam however wet prep today in clinic was positive for yeast. Patient continues to have some risky sexual behavior so a gonorrhea and chlamydia was also obtained. Patient was educated on importance of avoiding soap and lotion products around the vulva and especially the avoidance of vaginal "scrubbing" which she endorsed while using various cleaning products in the shower. - Diflucan 150 mg once. One refill given.    Orders Placed This Encounter  Procedures  . POCT Wet Prep [employer])  . POCT urinalysis dipstick    Meds ordered this encounter  Medications  . DISCONTD: fluconazole (DIFLUCAN) 150 MG tablet    Sig: Take 1 tablet (150 mg total) by mouth once.    Dispense:  1 tablet    Refill:  0  . fluconazole  (DIFLUCAN) 150 MG tablet    Sig: Take 1 tablet (150 mg total) by mouth once.    Dispense:  1 tablet    Refill:  1     Elberta Leatherwood, MD,MS,  PGY3 06/26/2016 6:06 PM

## 2016-06-26 NOTE — Patient Instructions (Signed)

## 2016-06-26 NOTE — Assessment & Plan Note (Signed)
Patient is here with signs and symptoms consistent with candidal vaginitis. No substantial findings on physical exam however wet prep today in clinic was positive for yeast. Patient continues to have some risky sexual behavior so a gonorrhea and chlamydia was also obtained. Patient was educated on importance of avoiding soap and lotion products around the vulva and especially the avoidance of vaginal "scrubbing" which she endorsed while using various cleaning products in the shower. - Diflucan 150 mg once. One refill given.

## 2016-06-27 LAB — CERVICOVAGINAL ANCILLARY ONLY
Chlamydia: NEGATIVE
Neisseria Gonorrhea: NEGATIVE

## 2016-07-01 ENCOUNTER — Ambulatory Visit (HOSPITAL_COMMUNITY)
Admission: EM | Admit: 2016-07-01 | Discharge: 2016-07-01 | Disposition: A | Discharge status: Home / Self Care | Payer: Medicare Other | Attending: Emergency Medicine | Admitting: Emergency Medicine

## 2016-07-01 ENCOUNTER — Encounter (HOSPITAL_COMMUNITY): Payer: Self-pay | Admitting: Emergency Medicine

## 2016-07-01 DIAGNOSIS — Z9049 Acquired absence of other specified parts of digestive tract: Secondary | ICD-10-CM | POA: Insufficient documentation

## 2016-07-01 DIAGNOSIS — N390 Urinary tract infection, site not specified: Secondary | ICD-10-CM | POA: Insufficient documentation

## 2016-07-01 DIAGNOSIS — Z9851 Tubal ligation status: Secondary | ICD-10-CM | POA: Diagnosis not present

## 2016-07-01 DIAGNOSIS — F17210 Nicotine dependence, cigarettes, uncomplicated: Secondary | ICD-10-CM | POA: Diagnosis not present

## 2016-07-01 DIAGNOSIS — Z79899 Other long term (current) drug therapy: Secondary | ICD-10-CM | POA: Insufficient documentation

## 2016-07-01 LAB — POCT URINALYSIS DIP (DEVICE)
Glucose, UA: NEGATIVE mg/dL
Nitrite: NEGATIVE
PH: 6 (ref 5.0–8.0)
PROTEIN: 100 mg/dL — AB
Urobilinogen, UA: 2 mg/dL — ABNORMAL HIGH (ref 0.0–1.0)

## 2016-07-01 MED ORDER — METRONIDAZOLE 500 MG PO TABS: 500.0000 mg | ORAL_TABLET | Freq: Two times a day (BID) | ORAL | Status: DC

## 2016-07-01 MED ORDER — CIPROFLOXACIN HCL 500 MG PO TABS: 500.0000 mg | ORAL_TABLET | Freq: Two times a day (BID) | ORAL | Status: DC

## 2016-07-01 MED ORDER — FLUCONAZOLE 150 MG PO TABS: 150.0000 mg | ORAL_TABLET | Freq: Once | ORAL | Status: DC

## 2016-07-01 NOTE — ED Provider Notes (Signed)
CSN: [id number]     Arrival date & time 07/01/16  1520 History   First MD Initiated Contact with Patient 07/01/16 1719     Chief Complaint  Patient presents with  . Urinary Tract Infection   (Consider location/radiation/quality/duration/timing/severity/associated sxs/prior Treatment) HPI  She is a 30 year old woman here for evaluation of possible urinary tract infection. She reports onset of symptoms this morning with frequency and discomfort with urination. She denies any abdominal pain, but does report some intermittent low back pain. She states her period started today. She is also had some vaginal itching for which she took Diflucan yesterday. She states she had a UTI back in April or May and then developed BV and then a yeast infection last week.  She reports frequent UTIs. She thinks she maybe has 4-6 a year. No recent change in medication. She does have 6 children.  Past Medical History  Diagnosis Date  . Bipolar 1 disorder ([place])   . Opiate addiction ([place])   . Abnormal Pap smear   . Depression   . Polysubstance abuse   . Hypertension   . Anxiety   . Chlamydia 07/12/2012  . Headache    Past Surgical History  Procedure Laterality Date  . Vaginal deliveries    . Tubal ligation  09/08/2011    Procedure: POST PARTUM TUBAL LIGATION;  Surgeon: Emeterio Reeve, MD;  Location: [facility];  Service: Gynecology;  Laterality: Bilateral;  . Cholecystectomy  11/04/2012    Procedure: LAPAROSCOPIC CHOLECYSTECTOMY WITH INTRAOPERATIVE CHOLANGIOGRAM;  Surgeon: Imogene Burn. Georgette Dover, MD;  Location: [facility];  Service: General;  Laterality: N/A;   Family History  Problem Relation Age of Onset  . Drug abuse Mother   . Drug abuse Father   . Diabetes Maternal Grandmother   . Hypertension Maternal Grandmother   . Diabetes Paternal Grandmother   . Diabetes Paternal Grandfather    Social History  Substance Use Topics  . Smoking status: Current Every Day Smoker -- 1.00 packs/day    Types: Cigarettes  .  Smokeless tobacco: Never Used  . Alcohol Use: No   OB History    Gravida Para Term Preterm AB TAB SAB Ectopic Multiple Living   7 6 3 3 1 1   1 6      Review of Systems As in history of present illness Allergies  Review of patient's allergies indicates no known allergies.  Home Medications   Prior to Admission medications   Medication Sig Start Date End Date Taking? Authorizing Provider  citalopram (CELEXA) 10 MG tablet Take 10 mg by mouth daily.   Yes Historical Provider, MD  gabapentin (NEURONTIN) 600 MG tablet Take 600 mg by mouth 3 (three) times daily.   Yes Historical Provider, MD  methadone (METHADOSE) 40 MG disintegrating tablet Take 40 mg by mouth every 6 (six) hours as needed.   Yes Historical Provider, MD  QUEtiapine (SEROQUEL) 300 MG tablet Take 300 mg by mouth at bedtime.   Yes Historical Provider, MD  ciprofloxacin (CIPRO) 500 MG tablet Take 1 tablet (500 mg total) by mouth 2 (two) times daily. 07/01/16   Melony Overly, MD  fluconazole (DIFLUCAN) 150 MG tablet Take 1 tablet (150 mg total) by mouth once. Take 2nd pill if symptoms not completely resolved in 3 days. 07/01/16   Melony Overly, MD  HYDROcodone-acetaminophen (NORCO) 7.5-325 MG tablet Take 1 tablet by mouth every 4 (four) hours as needed. 05/17/16   Janne Napoleon, NP  ibuprofen (ADVIL,MOTRIN) 600 MG tablet Take 1  tablet (600 mg total) by mouth every 8 (eight) hours as needed. 03/09/16   Jola Schmidt, MD  metroNIDAZOLE (FLAGYL) 500 MG tablet Take 1 tablet (500 mg total) by mouth 2 (two) times daily. 07/01/16   Melony Overly, MD  naproxen sodium (ANAPROX) 220 MG tablet Take 440 mg by mouth 2 (two) times daily as needed (for headaches.).    Historical Provider, MD  pantoprazole (PROTONIX) 40 MG tablet Take 1 tablet (40 mg total) by mouth daily. 04/29/15   Nolon Rod, DO  SUMAtriptan (IMITREX) 100 MG tablet Take 1tab at earliest onset of headache.  May repeat x1 in 2 hours if headache persists or recurs.  Do not exceed 2 tablets in  24 hours. 03/24/15   Pieter Partridge, DO  terbinafine (LAMISIL) 250 MG tablet Take 1 tablet (250 mg total) by mouth daily. 04/29/15   Nolon Rod, DO  trimethoprim-polymyxin b (POLYTRIM) ophthalmic solution Place 1 drop into the right eye every 4 (four) hours. 05/17/16   Janne Napoleon, NP  venlafaxine XR (EFFEXOR-XR) 37.5 MG 24 hr capsule Take 1cap at bedtime x7d, then 2caps at bedtime 03/24/15   Pieter Partridge, DO   Meds Ordered and Administered this Visit  Medications - No data to display  BP 121/77 mmHg  Pulse 66  Temp(Src) 98.1 F (36.7 C) (Oral)  Resp 18  SpO2 100%  LMP 06/02/2016 (Exact Date) No data found.   Physical Exam  Constitutional: She is oriented to person, place, and time. She appears well-developed and well-nourished. No distress.  Cardiovascular: Normal rate.   Pulmonary/Chest: Effort normal.  Abdominal: Soft. She exhibits no distension. There is tenderness (mild tenderness in right lower quadrant and suprapubic).  No CVA tenderness  Neurological: She is alert and oriented to person, place, and time.  Skin: Skin is warm.  Psychiatric:  Speech is somewhat pressured, but she is interruptable. Linear thought content.    ED Course  Procedures (including critical care time)  Labs Review Labs Reviewed  POCT URINALYSIS DIP (DEVICE) - Abnormal; Notable for the following:    Bilirubin Urine SMALL (*)    Ketones, ur TRACE (*)    Hgb urine dipstick LARGE (*)    Protein, ur 100 (*)    Urobilinogen, UA 2.0 (*)    Leukocytes, UA TRACE (*)    All other components within normal limits  URINE CULTURE    Imaging Review No results found.    MDM   1. UTI (lower urinary tract infection)    Urine sent for culture. Cipro for UTI. Prescriptions given for Flagyl and Diflucan to use if needed. Follow-up with PCP. If she is truly having 4-6 UTIs a year, she would likely benefit from referral to urology. Given multiple pregnancies and vaginal deliveries, she may have a degree of  bladder prolapse.    Melony Overly, MD 07/01/16 [phone]

## 2016-07-01 NOTE — Discharge Instructions (Signed)
It does look like you have another UTI. I'm sending your urine for culture. This will let me know if we need to change your antibiotic. I've also provided prescriptions for Flagyl and Diflucan. If you develop a vaginal discharge with a fishy odor, take the Flagyl. If you develop vaginal itching, take the Diflucan. If you are having frequent urinary tract infections, it would likely be beneficial for you to see a urologist. Follow-up as needed.

## 2016-07-01 NOTE — ED Notes (Signed)
Here for UTI sx onset this am Sx include: urinary freq, pressure when voiding urine Reports she took a diflucan tab yest for vag itching A&O x4... NAD

## 2016-07-03 LAB — URINE CULTURE: Culture: 60000 — AB

## 2016-09-19 ENCOUNTER — Encounter (HOSPITAL_COMMUNITY): Payer: Self-pay | Admitting: Emergency Medicine

## 2016-09-19 ENCOUNTER — Inpatient Hospital Stay (HOSPITAL_COMMUNITY)
Admission: EM | Admit: 2016-09-19 | Discharge: 2016-09-21 | DRG: 871 | Disposition: A | Discharge status: Home / Self Care | Payer: Medicare Other | Attending: Family Medicine | Admitting: Family Medicine

## 2016-09-19 ENCOUNTER — Emergency Department (HOSPITAL_COMMUNITY): Payer: Medicare Other

## 2016-09-19 DIAGNOSIS — Z8249 Family history of ischemic heart disease and other diseases of the circulatory system: Secondary | ICD-10-CM

## 2016-09-19 DIAGNOSIS — R12 Heartburn: Secondary | ICD-10-CM | POA: Diagnosis present

## 2016-09-19 DIAGNOSIS — F17200 Nicotine dependence, unspecified, uncomplicated: Secondary | ICD-10-CM | POA: Diagnosis not present

## 2016-09-19 DIAGNOSIS — R001 Bradycardia, unspecified: Secondary | ICD-10-CM | POA: Diagnosis present

## 2016-09-19 DIAGNOSIS — J45901 Unspecified asthma with (acute) exacerbation: Secondary | ICD-10-CM | POA: Diagnosis not present

## 2016-09-19 DIAGNOSIS — R079 Chest pain, unspecified: Secondary | ICD-10-CM | POA: Diagnosis not present

## 2016-09-19 DIAGNOSIS — J129 Viral pneumonia, unspecified: Secondary | ICD-10-CM | POA: Diagnosis present

## 2016-09-19 DIAGNOSIS — F1120 Opioid dependence, uncomplicated: Secondary | ICD-10-CM | POA: Diagnosis present

## 2016-09-19 DIAGNOSIS — Z590 Homelessness: Secondary | ICD-10-CM | POA: Diagnosis not present

## 2016-09-19 DIAGNOSIS — I959 Hypotension, unspecified: Secondary | ICD-10-CM | POA: Diagnosis not present

## 2016-09-19 DIAGNOSIS — I10 Essential (primary) hypertension: Secondary | ICD-10-CM | POA: Diagnosis not present

## 2016-09-19 DIAGNOSIS — J984 Other disorders of lung: Secondary | ICD-10-CM

## 2016-09-19 DIAGNOSIS — J4520 Mild intermittent asthma, uncomplicated: Secondary | ICD-10-CM | POA: Diagnosis not present

## 2016-09-19 DIAGNOSIS — Z79899 Other long term (current) drug therapy: Secondary | ICD-10-CM

## 2016-09-19 DIAGNOSIS — Z833 Family history of diabetes mellitus: Secondary | ICD-10-CM

## 2016-09-19 DIAGNOSIS — J45909 Unspecified asthma, uncomplicated: Secondary | ICD-10-CM

## 2016-09-19 DIAGNOSIS — E872 Acidosis, unspecified: Secondary | ICD-10-CM | POA: Diagnosis present

## 2016-09-19 DIAGNOSIS — E876 Hypokalemia: Secondary | ICD-10-CM | POA: Diagnosis not present

## 2016-09-19 DIAGNOSIS — A4189 Other specified sepsis: Secondary | ICD-10-CM | POA: Diagnosis not present

## 2016-09-19 DIAGNOSIS — F319 Bipolar disorder, unspecified: Secondary | ICD-10-CM | POA: Diagnosis present

## 2016-09-19 DIAGNOSIS — R6521 Severe sepsis with septic shock: Secondary | ICD-10-CM

## 2016-09-19 DIAGNOSIS — R062 Wheezing: Secondary | ICD-10-CM | POA: Diagnosis not present

## 2016-09-19 DIAGNOSIS — A419 Sepsis, unspecified organism: Secondary | ICD-10-CM

## 2016-09-19 DIAGNOSIS — J4521 Mild intermittent asthma with (acute) exacerbation: Secondary | ICD-10-CM | POA: Diagnosis not present

## 2016-09-19 DIAGNOSIS — J189 Pneumonia, unspecified organism: Secondary | ICD-10-CM | POA: Diagnosis not present

## 2016-09-19 DIAGNOSIS — Z87891 Personal history of nicotine dependence: Secondary | ICD-10-CM | POA: Diagnosis present

## 2016-09-19 DIAGNOSIS — J181 Lobar pneumonia, unspecified organism: Secondary | ICD-10-CM

## 2016-09-19 DIAGNOSIS — F17210 Nicotine dependence, cigarettes, uncomplicated: Secondary | ICD-10-CM | POA: Diagnosis not present

## 2016-09-19 DIAGNOSIS — R0602 Shortness of breath: Secondary | ICD-10-CM | POA: Diagnosis not present

## 2016-09-19 DIAGNOSIS — K5909 Other constipation: Secondary | ICD-10-CM | POA: Diagnosis present

## 2016-09-19 HISTORY — DX: Unspecified asthma, uncomplicated: J45.909

## 2016-09-19 LAB — URINALYSIS, ROUTINE W REFLEX MICROSCOPIC
Bilirubin Urine: NEGATIVE
Glucose, UA: NEGATIVE mg/dL
Hgb urine dipstick: NEGATIVE
KETONES UR: NEGATIVE mg/dL
LEUKOCYTES UA: NEGATIVE
NITRITE: NEGATIVE
PH: 6 (ref 5.0–8.0)
PROTEIN: NEGATIVE mg/dL
Specific Gravity, Urine: 1.02 (ref 1.005–1.030)

## 2016-09-19 LAB — COMPREHENSIVE METABOLIC PANEL
ALBUMIN: 4.1 g/dL (ref 3.5–5.0)
ALT: 12 U/L — ABNORMAL LOW (ref 14–54)
ANION GAP: 10 (ref 5–15)
AST: 21 U/L (ref 15–41)
Alkaline Phosphatase: 63 U/L (ref 38–126)
BILIRUBIN TOTAL: 0.9 mg/dL (ref 0.3–1.2)
BUN: 6 mg/dL (ref 6–20)
CALCIUM: 9.3 mg/dL (ref 8.9–10.3)
CO2: 21 mmol/L — AB (ref 22–32)
Chloride: 105 mmol/L (ref 101–111)
Creatinine, Ser: 0.71 mg/dL (ref 0.44–1.00)
GFR calc non Af Amer: >60 mL/min (ref >60)
GLUCOSE: 115 mg/dL — AB (ref 65–99)
POTASSIUM: 3.1 mmol/L — AB (ref 3.5–5.1)
SODIUM: 136 mmol/L (ref 135–145)
TOTAL PROTEIN: 7.1 g/dL (ref 6.5–8.1)

## 2016-09-19 LAB — CBC
HCT: 41.7 % (ref 36.0–46.0)
Hemoglobin: 13.4 g/dL (ref 12.0–15.0)
MCH: 28.3 pg (ref 26.0–34.0)
MCHC: 32.1 g/dL (ref 30.0–36.0)
MCV: 88.2 fL (ref 78.0–100.0)
Platelets: 286 10*3/uL (ref 150–400)
RBC: 4.73 MIL/uL (ref 3.87–5.11)
RDW: 13.2 % (ref 11.5–15.5)
WBC: 13 10*3/uL — ABNORMAL HIGH (ref 4.0–10.5)

## 2016-09-19 LAB — I-STAT TROPONIN, ED: Troponin i, poc: 0 ng/mL (ref 0.00–0.08)

## 2016-09-19 LAB — LACTIC ACID, PLASMA
LACTIC ACID, VENOUS: 2.1 mmol/L — AB (ref 0.5–1.9)
LACTIC ACID, VENOUS: 1.9 mmol/L (ref 0.5–1.9)
Lactic Acid, Venous: 0.9 mmol/L (ref 0.5–1.9)

## 2016-09-19 LAB — PROCALCITONIN
Procalcitonin: 0.1 ng/mL
Procalcitonin: <0.1 ng/mL

## 2016-09-19 LAB — APTT
aPTT: 33 seconds (ref 24–36)
aPTT: 30 seconds (ref 24–36)

## 2016-09-19 LAB — INFLUENZA PANEL BY PCR (TYPE A & B)
H1N1FLUPCR: NOT DETECTED
INFLAPCR: NEGATIVE
Influenza B By PCR: NEGATIVE

## 2016-09-19 LAB — TYPE AND SCREEN
ABO/RH(D): O POS
Antibody Screen: NEGATIVE

## 2016-09-19 LAB — MRSA PCR SCREENING: MRSA by PCR: NEGATIVE

## 2016-09-19 LAB — I-STAT CG4 LACTIC ACID, ED
Lactic Acid, Venous: 4.03 mmol/L (ref 0.5–1.9)
Lactic Acid, Venous: 5.44 mmol/L (ref 0.5–1.9)

## 2016-09-19 LAB — STREP PNEUMONIAE URINARY ANTIGEN: STREP PNEUMO URINARY ANTIGEN: NEGATIVE

## 2016-09-19 LAB — CORTISOL: CORTISOL PLASMA: 3.3 ug/dL

## 2016-09-19 LAB — ETHANOL

## 2016-09-19 LAB — PROTIME-INR
INR: 1.16
INR: 1.23
PROTHROMBIN TIME: 14.8 s (ref 11.4–15.2)
Prothrombin Time: 15.6 seconds — ABNORMAL HIGH (ref 11.4–15.2)

## 2016-09-19 LAB — TROPONIN I
TROPONIN I: 0.09 ng/mL — AB (ref <0.03)
Troponin I: <0.03 ng/mL (ref <0.03)

## 2016-09-19 LAB — ABO/RH: ABO/RH(D): O POS

## 2016-09-19 MED ORDER — ONDANSETRON HCL 4 MG/2ML IJ SOLN
4.0000 mg | Freq: Four times a day (QID) | INTRAMUSCULAR | Status: DC | PRN
  Administered 2016-09-19: 4 mg via INTRAVENOUS

## 2016-09-19 MED ORDER — ACETAMINOPHEN 325 MG PO TABS
650.0000 mg | ORAL_TABLET | Freq: Four times a day (QID) | ORAL | Status: DC | PRN
  Administered 2016-09-19 – 2016-09-20 (×2): 650 mg via ORAL
  Filled 2016-09-19 (×2): qty 2

## 2016-09-19 MED ORDER — PHENYLEPHRINE HCL 10 MG/ML IJ SOLN
20.0000 ug/min | INTRAVENOUS | Status: DC
  Filled 2016-09-19: qty 1

## 2016-09-19 MED ORDER — METHADONE HCL 5 MG PO TABS
25.0000 mg | ORAL_TABLET | Freq: Every day | ORAL | Status: DC
  Administered 2016-09-19 – 2016-09-20 (×2): 25 mg via ORAL
  Filled 2016-09-19: qty 3
  Filled 2016-09-19: qty 2

## 2016-09-19 MED ORDER — METHYLPREDNISOLONE SODIUM SUCC 125 MG IJ SOLR: 125.0000 mg | Freq: Once | INTRAMUSCULAR | Status: DC

## 2016-09-19 MED ORDER — METHYLPREDNISOLONE SODIUM SUCC 40 MG IJ SOLR
40.0000 mg | Freq: Three times a day (TID) | INTRAMUSCULAR | Status: DC
  Administered 2016-09-19 – 2016-09-20 (×3): 40 mg via INTRAVENOUS
  Filled 2016-09-19 (×3): qty 1

## 2016-09-19 MED ORDER — SODIUM CHLORIDE 0.9 % IV BOLUS (SEPSIS)
1000.0000 mL | Freq: Once | INTRAVENOUS | Status: AC
  Administered 2016-09-19: 1000 mL via INTRAVENOUS

## 2016-09-19 MED ORDER — ALBUTEROL (5 MG/ML) CONTINUOUS INHALATION SOLN
10.0000 mg/h | INHALATION_SOLUTION | Freq: Once | RESPIRATORY_TRACT | Status: AC
  Administered 2016-09-19: 10 mg/h via RESPIRATORY_TRACT
  Filled 2016-09-19: qty 20

## 2016-09-19 MED ORDER — IPRATROPIUM BROMIDE 0.02 % IN SOLN
0.5000 mg | Freq: Once | RESPIRATORY_TRACT | Status: AC
  Administered 2016-09-19: 0.5 mg via RESPIRATORY_TRACT
  Filled 2016-09-19: qty 2.5

## 2016-09-19 MED ORDER — METHADONE 0.4 MG/ML ORAL SOLUTION: 25.0000 mg | Freq: Every day | ORAL | Status: DC

## 2016-09-19 MED ORDER — POLYETHYLENE GLYCOL 3350 17 G PO PACK: 17.0000 g | PACK | Freq: Every day | ORAL | Status: DC | PRN

## 2016-09-19 MED ORDER — POTASSIUM CHLORIDE CRYS ER 20 MEQ PO TBCR
40.0000 meq | EXTENDED_RELEASE_TABLET | Freq: Once | ORAL | Status: AC
  Administered 2016-09-19: 40 meq via ORAL
  Filled 2016-09-19: qty 2

## 2016-09-19 MED ORDER — IPRATROPIUM-ALBUTEROL 0.5-2.5 (3) MG/3ML IN SOLN
3.0000 mL | Freq: Four times a day (QID) | RESPIRATORY_TRACT | Status: DC
  Filled 2016-09-19: qty 3

## 2016-09-19 MED ORDER — NICOTINE 21 MG/24HR TD PT24
21.0000 mg | MEDICATED_PATCH | Freq: Every day | TRANSDERMAL | Status: DC
  Administered 2016-09-20 – 2016-09-21 (×2): 21 mg via TRANSDERMAL
  Filled 2016-09-19 (×2): qty 1

## 2016-09-19 MED ORDER — ALBUTEROL SULFATE (2.5 MG/3ML) 0.083% IN NEBU: 2.5000 mg | INHALATION_SOLUTION | RESPIRATORY_TRACT | Status: DC | PRN

## 2016-09-19 MED ORDER — IPRATROPIUM-ALBUTEROL 0.5-2.5 (3) MG/3ML IN SOLN
3.0000 mL | RESPIRATORY_TRACT | Status: DC
  Administered 2016-09-19 (×3): 3 mL via RESPIRATORY_TRACT
  Filled 2016-09-19 (×3): qty 3

## 2016-09-19 MED ORDER — SODIUM CHLORIDE 0.9 % IV SOLN
INTRAVENOUS | Status: DC
  Administered 2016-09-19 – 2016-09-20 (×3): via INTRAVENOUS

## 2016-09-19 MED ORDER — ONDANSETRON HCL 4 MG/2ML IJ SOLN
4.0000 mg | Freq: Once | INTRAMUSCULAR | Status: AC
  Administered 2016-09-19: 4 mg via INTRAVENOUS
  Filled 2016-09-19: qty 2

## 2016-09-19 MED ORDER — AZITHROMYCIN 500 MG IV SOLR
500.0000 mg | Freq: Once | INTRAVENOUS | Status: AC
  Administered 2016-09-19: 500 mg via INTRAVENOUS
  Filled 2016-09-19: qty 500

## 2016-09-19 MED ORDER — TUBERCULIN PPD 5 UNIT/0.1ML ID SOLN
5.0000 [IU] | Freq: Once | INTRADERMAL | Status: DC
  Filled 2016-09-19: qty 0.1

## 2016-09-19 MED ORDER — DEXTROSE 5 % IV SOLN
500.0000 mg | INTRAVENOUS | Status: DC
  Administered 2016-09-20 – 2016-09-21 (×2): 500 mg via INTRAVENOUS
  Filled 2016-09-19 (×2): qty 500

## 2016-09-19 MED ORDER — IBUPROFEN 800 MG PO TABS
800.0000 mg | ORAL_TABLET | Freq: Once | ORAL | Status: AC
  Administered 2016-09-19: 800 mg via ORAL
  Filled 2016-09-19: qty 1

## 2016-09-19 MED ORDER — IPRATROPIUM-ALBUTEROL 0.5-2.5 (3) MG/3ML IN SOLN
3.0000 mL | Freq: Four times a day (QID) | RESPIRATORY_TRACT | Status: DC
  Administered 2016-09-20 – 2016-09-21 (×6): 3 mL via RESPIRATORY_TRACT
  Filled 2016-09-19 (×5): qty 3

## 2016-09-19 MED ORDER — NICOTINE 21 MG/24HR TD PT24
21.0000 mg | MEDICATED_PATCH | Freq: Once | TRANSDERMAL | Status: AC
  Administered 2016-09-19: 21 mg via TRANSDERMAL
  Filled 2016-09-19: qty 1

## 2016-09-19 MED ORDER — GABAPENTIN 300 MG PO CAPS
600.0000 mg | ORAL_CAPSULE | Freq: Three times a day (TID) | ORAL | Status: DC
  Administered 2016-09-19 – 2016-09-21 (×6): 600 mg via ORAL
  Filled 2016-09-19 (×6): qty 2

## 2016-09-19 MED ORDER — DEXTROSE 5 % IV SOLN
1.0000 g | INTRAVENOUS | Status: DC
  Administered 2016-09-20: 1 g via INTRAVENOUS
  Filled 2016-09-19: qty 10

## 2016-09-19 MED ORDER — DEXTROSE 5 % IV SOLN
1.0000 g | Freq: Once | INTRAVENOUS | Status: AC
  Administered 2016-09-19: 1 g via INTRAVENOUS
  Filled 2016-09-19: qty 10

## 2016-09-19 MED ORDER — BUDESONIDE 0.5 MG/2ML IN SUSP
0.5000 mg | Freq: Two times a day (BID) | RESPIRATORY_TRACT | Status: DC
  Administered 2016-09-19 – 2016-09-21 (×5): 0.5 mg via RESPIRATORY_TRACT
  Filled 2016-09-19 (×5): qty 2

## 2016-09-19 MED ORDER — ENOXAPARIN SODIUM 40 MG/0.4ML ~~LOC~~ SOLN
40.0000 mg | SUBCUTANEOUS | Status: DC
  Administered 2016-09-19 – 2016-09-21 (×3): 40 mg via SUBCUTANEOUS
  Filled 2016-09-19 (×4): qty 0.4

## 2016-09-19 MED ORDER — QUETIAPINE FUMARATE 100 MG PO TABS
200.0000 mg | ORAL_TABLET | Freq: Every day | ORAL | Status: DC
  Administered 2016-09-19 – 2016-09-20 (×2): 200 mg via ORAL
  Filled 2016-09-19 (×2): qty 2

## 2016-09-19 MED ORDER — ARIPIPRAZOLE 5 MG PO TABS
5.0000 mg | ORAL_TABLET | Freq: Every day | ORAL | Status: DC
  Administered 2016-09-19 – 2016-09-21 (×3): 5 mg via ORAL
  Filled 2016-09-19 (×3): qty 1

## 2016-09-19 NOTE — ED Notes (Signed)
Pt was given drink at this time.

## 2016-09-19 NOTE — ED Notes (Signed)
CCM in to see

## 2016-09-19 NOTE — ED Notes (Signed)
Patient transported to X-ray 

## 2016-09-19 NOTE — ED Notes (Signed)
Paged admitting to inform about elevated troponin and request nausea medication.

## 2016-09-19 NOTE — ED Notes (Signed)
Ambulated pt to restroom. Pt ambulated self efficiently but felt sob.

## 2016-09-19 NOTE — ED Notes (Signed)
Patient returned from xray.

## 2016-09-19 NOTE — ED Triage Notes (Signed)
Patient with shortness of breath that started earlier today, patient does have a productive cough with the shortness of breath, wheezing inspiratory and expiratory. .  Patient has had a history of fever at home.  EMS states that they gave 1 duoneb and 1 albuterol neb en route to ED.  Patient was given 125mg  solumedrol en route to ED also.  No chest pain per patient.  Patient still has some wheezing, expiratory after breathing treatments.
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
## 2016-09-19 NOTE — ED Notes (Signed)
Pt c/o numbness in feet and legs- now in face-- "feels like bugs crawling on me"  Resident paged -- [phone]-- pt requesting something for nerves and smoking patch also.
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
## 2016-09-19 NOTE — ED Notes (Signed)
Paged PCCM to [phone] 

## 2016-09-19 NOTE — Consult Note (Signed)
PULMONARY / CRITICAL CARE MEDICINE   Name: Rhonda Cabrera MRN: [id number] DOB: [birth year]    ADMISSION DATE:  09/19/2016 CONSULTATION DATE:  09/19/16  REFERRING MD:  edp  CHIEF COMPLAINT: Coughing, fever, sob  HISTORY OF PRESENT ILLNESS:   30 yo aaf, life long 1 ppd smoker, mild asthma ,bipolar, hx of polysubstance abuse(on methadone) who notes increased sob x 3 days,fever,  used son's nebulizer that helped, increased chest tightness, violent cough with 24 hrs of green sputum. She called 911 09/18/16 was given neb but did not go to hospital. 10/3 again calls EMS and presents to ED with hypotension, purulent green sputum and radiographic evidence of RUL pna. PCCM asked to evaluate. She is awake and alert mildly hypotensive but is on 4 th litre of IVF and her lactic acid has risen to 5.44 from 4.03. She will be admitted to SDU and PCCM will follow. Of note she started nicotine patch 10/2 because she could not smoke due to coughing.  PAST MEDICAL HISTORY :  She  has a past medical history of Abnormal Pap smear; Anxiety; Asthma; Bipolar 1 disorder ([place]); Chlamydia (07/12/2012); Depression; Headache; Hypertension; Opiate addiction ([place]); and Polysubstance abuse.  PAST SURGICAL HISTORY: She  has a past surgical history that includes vaginal deliveries; Tubal ligation (09/08/2011); and Cholecystectomy (11/04/2012).  No Known Allergies  No current facility-administered medications on file prior to encounter.    Current Outpatient Prescriptions on File Prior to Encounter  Medication Sig  . gabapentin (NEURONTIN) 600 MG tablet Take 600 mg by mouth 3 (three) times daily.  . naproxen sodium (ANAPROX) 220 MG tablet Take 440 mg by mouth 2 (two) times daily as needed (for headaches.).  Marland Kitchen QUEtiapine (SEROQUEL) 300 MG tablet Take 200 mg by mouth at bedtime.     FAMILY HISTORY:  Her indicated that her mother is alive. She indicated that her father is alive. She indicated that her sister is alive. She  indicated that both of her brothers are alive. She indicated that the status of her maternal grandmother is unknown. She indicated that the status of her paternal grandmother is unknown. She indicated that the status of her paternal grandfather is unknown.    SOCIAL HISTORY: She  reports that she has been smoking Cigarettes.  She has been smoking about 1.00 pack per day. She has never used smokeless tobacco. She reports that she does not drink alcohol or use drugs.  REVIEW OF SYSTEMS:   10 point review of system taken, please see HPI for positives and negatives.   SUBJECTIVE:  Awake and alert  VITAL SIGNS: BP (!) 85/45   Pulse (!) 121   Temp 99.8 F (37.7 C) (Oral)   Resp (!) 28   LMP 08/26/2016   SpO2 100%   HEMODYNAMICS:    VENTILATOR SETTINGS:    INTAKE / OUTPUT: I/O last 3 completed shifts: In: 3000 [I.V.:3000] Out: -   PHYSICAL EXAMINATION: General: WNWDAAF NAD @ rest Neuro:  Intact HEENT: Oral mucosa dry, poor dentition, no jvd Cardiovascular:  HSR RRR Lungs: Mild exp wheeze, good air movement Abdomen: Soft + bs Musculoskeletal:Intact Skin:  Warm and dry  LABS:  BMET  Recent Labs Lab 09/19/16 0228  NA 136  K 3.1*  CL 105  CO2 21*  BUN 6  CREATININE 0.71  GLUCOSE 115*    Electrolytes  Recent Labs Lab 09/19/16 0228  CALCIUM 9.3    CBC  Recent Labs Lab 09/19/16 0228  WBC 13.0*  HGB 13.4  HCT 41.7  PLT 286    Coag's No results for input(s): APTT, INR in the last 168 hours.  Sepsis Markers  Recent Labs Lab 09/19/16 0455 09/19/16 0646  LATICACIDVEN 4.03* 5.44*    ABG No results for input(s): PHART, PCO2ART, PO2ART in the last 168 hours.  Liver Enzymes  Recent Labs Lab 09/19/16 0228  AST 21  ALT 12*  ALKPHOS 63  BILITOT 0.9  ALBUMIN 4.1    Cardiac Enzymes No results for input(s): TROPONINI, PROBNP in the last 168 hours.  Glucose No results for input(s): GLUCAP in the last 168 hours.  Imaging Dg Chest 2  View  Result Date: 09/19/2016 CLINICAL DATA:  Mid chest pain tonight. EXAM: CHEST  2 VIEW COMPARISON:  09/19/2016 FINDINGS: Normal heart size and pulmonary vascularity. Patchy infiltration in the right upper lung suggesting pneumonia. There is mild progression since the previous study. Left lung is clear. No pleural effusions. No pneumothorax. Surgical clips in the right upper quadrant. IMPRESSION: Developing patchy infiltration in the right upper lung suggesting pneumonia. Electronically Signed   By: Lucienne Capers M.D.   On: 09/19/2016 05:53   Dg Chest 2 View  Result Date: 09/19/2016 CLINICAL DATA:  Shortness of breath, congestion, and fever today. EXAM: CHEST  2 VIEW COMPARISON:  11/09/2012 FINDINGS: Poorly defined vague opacities in the right upper lung suggesting early pneumonia. Left lung is clear. No pleural effusions. No pneumothorax. Normal heart size and pulmonary vascularity. Surgical clips in the right upper quadrant. IMPRESSION: Early infiltration in the right upper lung suggesting pneumonia. Electronically Signed   By: Lucienne Capers M.D.   On: 09/19/2016 02:33     STUDIES:    CULTURES: 10/3 bc x 2>> 10/3 sputum>> 10/3 Flu panel>> 10/3 urine legionella > 10/3 strep ag >   ANTIBIOTICS: 10/3 roc>> 10/3 zithro>>  SIGNIFICANT EVENTS: 10/3 admit to [facility] 10/3 sepsis protocol activated @0845 . Lactic acid 5.44. 4000 cc of ns have been infused prior to PCCM arrival at 0730. BP improved.   LINES/TUBES:   DISCUSSION: 30 yo aaf, life long 1 ppd smoker, mild asthma ,bipolar, hx of polysubstance abuse(on methadone) who notes increased sob x 3 days,fever,  used son's nebulizer that helped, increased chest tightness, violent cough with 24 hrs of green sputum. She called 911 09/18/16 was given neb but did not go to hospital. 10/3 again calls EMS and presents to ED with hypotension, purulent green sputum and radiographic evidence of RUL pna. PCCM asked to evaluate. She is awake and  alert mildly hypotensive but is on 4 th litre of IVF and her lactic acid has risen to 5.44 from 4.03. She will be admitted to SDU and PCCM will follow.  ASSESSMENT / PLAN:  PULMONARY A: Presumed pna by hx and xray AE asthma  Continued tobacco abuse P:   Abx as noted BD's O2 as needed No need for steroids Admit to SDU  CARDIOVASCULAR A:  Hypotension  Presumed sepsis  P:  Fluid resuscitation. On litre 4 per ED at 0730. 10/3 Follow lactic acid Abx per protocol Admit to SDU Sepsis protocol activated 10/3 0845. Post 4 litre NS  RENAL A:   No acute issues P:   Monitor renal function  GASTROINTESTINAL A:   GI protection P:   PPI  HEMATOLOGIC A:   DVT protetion P:  SQ heparin  INFECTIOUS A:   Presumed Pna Hx of frequent UTI's and vaginal infections Hx of STD's P:   Treat as CAP Check flu panel for  completeness Day# 0 of R/Z  ENDOCRINE A:   No acute issues P:   Follow glucose on labs  NEUROLOGIC A:   No acute neurological event Bipolar do Hx of polysubstance abuse P:   RASS goal: 0 Continue home psych medications On methadone  25 mg daily, hold till bp stable UDS   FAMILY  - Updates:   - Inter-disciplinary family meet or Palliative Care meeting due by:  09/26/16     Richardson Landry Minor ACNP Maryanna Shape PCCM Pager [phone] till 3 pm If no answer page [phone] 09/19/2016, 7:48 AM   ATTENDING NOTE / ATTESTATION NOTE :   I have discussed the case with the resident/APP  Steve Minor.   I agree with the resident/APP's  history, physical examination, assessment, and plans.    I have edited the above note and modified it according to our agreed history, physical examination, assessment and plan.   Briefly, patient with 20-pack-year smoking history, continues to smoke cigs  one pack a day, on methadone, history of polysubstance abuse, taking care of 6 children ages 6-14, with known asthma as a child. Patient denies any recent exacerbation of asthma  until a month ago. She had a mild flare which improved with using her son's nebulizer. She came back to baseline. The last couple days, she had more cough than baseline. Last 24 hours, she had more shortness of breath and wheezing. Presented to the emergency room as severe acute exacerbation of asthma. Improved with IV medicines and nebulizer. Her lactic acid was elevated at 4, repeat lactate was higher at 5. She has been adequately fluid resuscitated by the time I saw her. She has received at least 4 L of fluid. On physical exam, she was seen comfortable. Not in distress. Blood pressure was 110/60, heart rate 110, respiratory rate 20s. Sats of 98% on 2 L. Afebrile. No accessory muscle use. No oral thrush. Wheezing bilaterally. No crackles or rhonchi. Rest of physical exam per above. Rest of labs reviewed. Chest x-ray, latest one, shows possible infiltrate in the right upper lung.  Assessment/Plan: 1. Severe acute exacerbation of asthma. Poor insight to illness. Start on IV steroids. Start Pulmicort nebulizer twice a day. Increase DuoNeb to every 4 hours. On discharge, she needs to be placed on either Symbicort  Or Advair or Dulera. Needs alb MDI on d/c. May need nebulizer. I discussed the importance of being on maintenance medicines as well as rescue inhalers. She needs to get these medicines from her primary care provider. Currently, she is not in severe distress. If she worsens, may need BiPAP. Agree with stepdown unit for now as were not sure how her lactate will be. I think her lactate is related to her work of breathing which should eventually get better as she is comfortable right now. If she improves in next 12 hours, she may be admitted to telemetry if there'll be no stepdown beds. Trend lactate.  2. PNA, RUL. Continue Rocephin and azithromycin for now. Cultures are pending. Check Streptococcus antigen in the urine, Legionella antigen in the urine.  3. Tobacco abuse. Counseled on smoking  cessation.  4. History of polysubstance abuse. Currently on methadone. Check alcohol level, urine drug screen.  Family : No family at bedside.    Monica Becton, MD 09/19/2016, 11:14 AM [facility] Pager [phone] After 3 pm or if no answer, call [phone]

## 2016-09-19 NOTE — ED Notes (Signed)
25mg  of methadone verified with ADS Einar Crow, RN.

## 2016-09-19 NOTE — ED Notes (Signed)
Ambulated pt to restroom. Pt stated that she was nauseous and not feeling well.

## 2016-09-20 ENCOUNTER — Inpatient Hospital Stay (HOSPITAL_COMMUNITY): Payer: Medicare Other

## 2016-09-20 DIAGNOSIS — F319 Bipolar disorder, unspecified: Secondary | ICD-10-CM

## 2016-09-20 DIAGNOSIS — J4520 Mild intermittent asthma, uncomplicated: Secondary | ICD-10-CM

## 2016-09-20 DIAGNOSIS — J45909 Unspecified asthma, uncomplicated: Secondary | ICD-10-CM

## 2016-09-20 DIAGNOSIS — A419 Sepsis, unspecified organism: Secondary | ICD-10-CM

## 2016-09-20 DIAGNOSIS — J189 Pneumonia, unspecified organism: Secondary | ICD-10-CM

## 2016-09-20 LAB — RESPIRATORY PANEL BY PCR
Adenovirus: NOT DETECTED
BORDETELLA PERTUSSIS-RVPCR: NOT DETECTED
CORONAVIRUS 229E-RVPPCR: NOT DETECTED
Chlamydophila pneumoniae: NOT DETECTED
Coronavirus HKU1: NOT DETECTED
Coronavirus NL63: NOT DETECTED
Coronavirus OC43: NOT DETECTED
INFLUENZA B-RVPPCR: NOT DETECTED
Influenza A: NOT DETECTED
MYCOPLASMA PNEUMONIAE-RVPPCR: NOT DETECTED
Metapneumovirus: NOT DETECTED
Parainfluenza Virus 1: NOT DETECTED
Parainfluenza Virus 2: NOT DETECTED
Parainfluenza Virus 3: NOT DETECTED
Parainfluenza Virus 4: NOT DETECTED
RESPIRATORY SYNCYTIAL VIRUS-RVPPCR: NOT DETECTED
Rhinovirus / Enterovirus: DETECTED — AB

## 2016-09-20 LAB — TROPONIN I

## 2016-09-20 LAB — BASIC METABOLIC PANEL WITH GFR
Anion gap: 5 (ref 5–15)
BUN: 6 mg/dL (ref 6–20)
CO2: 23 mmol/L (ref 22–32)
Calcium: 9 mg/dL (ref 8.9–10.3)
Chloride: 110 mmol/L (ref 101–111)
Creatinine, Ser: 0.58 mg/dL (ref 0.44–1.00)
GFR calc Af Amer: >60 mL/min
GFR calc non Af Amer: >60 mL/min
Glucose, Bld: 138 mg/dL — ABNORMAL HIGH (ref 65–99)
Potassium: 4.1 mmol/L (ref 3.5–5.1)
Sodium: 138 mmol/L (ref 135–145)

## 2016-09-20 LAB — CBC
HCT: 37.1 % (ref 36.0–46.0)
Hemoglobin: 11.9 g/dL — ABNORMAL LOW (ref 12.0–15.0)
MCH: 28.3 pg (ref 26.0–34.0)
MCHC: 32.1 g/dL (ref 30.0–36.0)
MCV: 88.3 fL (ref 78.0–100.0)
Platelets: 324 10*3/uL (ref 150–400)
RBC: 4.2 MIL/uL (ref 3.87–5.11)
RDW: 14.1 % (ref 11.5–15.5)
WBC: 12.4 10*3/uL — ABNORMAL HIGH (ref 4.0–10.5)

## 2016-09-20 LAB — LEGIONELLA PNEUMOPHILA SEROGP 1 UR AG: L. pneumophila Serogp 1 Ur Ag: NEGATIVE

## 2016-09-20 LAB — MAGNESIUM: Magnesium: 1.9 mg/dL (ref 1.7–2.4)

## 2016-09-20 LAB — PHOSPHORUS: Phosphorus: 1.9 mg/dL — ABNORMAL LOW (ref 2.5–4.6)

## 2016-09-20 LAB — LACTIC ACID, PLASMA: Lactic Acid, Venous: 0.6 mmol/L (ref 0.5–1.9)

## 2016-09-20 MED ORDER — METHYLPREDNISOLONE SODIUM SUCC 40 MG IJ SOLR
40.0000 mg | Freq: Two times a day (BID) | INTRAMUSCULAR | Status: DC
  Administered 2016-09-20 – 2016-09-21 (×2): 40 mg via INTRAVENOUS
  Filled 2016-09-20 (×2): qty 1

## 2016-09-20 MED ORDER — IBUPROFEN 200 MG PO TABS
600.0000 mg | ORAL_TABLET | Freq: Four times a day (QID) | ORAL | Status: DC | PRN
  Administered 2016-09-20 – 2016-09-21 (×2): 600 mg via ORAL
  Filled 2016-09-20 (×2): qty 3

## 2016-09-20 MED ORDER — METHADONE HCL 10 MG PO TABS
10.0000 mg | ORAL_TABLET | Freq: Two times a day (BID) | ORAL | Status: DC
  Administered 2016-09-21: 10 mg via ORAL
  Filled 2016-09-20: qty 1

## 2016-09-20 NOTE — Progress Notes (Signed)
Pt is to stay on 2 H at this time per MD request.

## 2016-09-20 NOTE — Progress Notes (Signed)
Family Medicine Teaching Service Daily Progress Note Intern Pager: [phone]  Patient name: Rhonda Cabrera Medical record number: [id number] Date of birth: Sep 01, 1986 Age: 30 y.o. Gender: female  Primary Care Provider: Junie Panning, DO Consultants: none Code Status: FULL  Pt Overview and Major Events to Date:  Admit 10/3 Start Azithromycin 10/3- Start Rocephin 10/3-10/4  Assessment and Plan: Rhonda Cabrera is a 30 y.o. female presenting with SOB . PMH is significant for bipolar d/o, chronic constipation, ?asthma  Sepsis 2/2 CAP, resolving: Initially meeting septic criteria with BP down trending 85/45 despite 3L fluid.  Sepsis protocol initiated.  WBC 13.0, HR 116, RR 28, BP 106/42, LA 4.03> 5.44>0.9>0.6,  T 99.8 (s/p Motrin).  CXR w/ RUL opacity concerning for pna. S/p Rocephin 1gm and Azithromycin.  Vitals stable.  Flu panel negative.  - CCM consulted, appreciate recs - Continue IV Azithromycin (Day 2). Rocephin discontinued this AM per CCM recc (likely viral, so can consider discontinuing Azithromycin also in AM) -consider transition to po antibiotics tomorrow - Blood cx obtained s/p abx, follow up results - Respiratory viral panel pending -continue to monitor respiratory status, satting well on RA now  Bipolar d/o: On Seroquel and Abilify - Continue home meds  ?Asthma -Albuterol prn -Budesonide neb BID -duonebs 4x daily starting 10/4 -methylprednisolone 40 mg IV  Hypokalemia, K+ 4.1, resolved -K+ 3.1, Kdur 32mEq given -monitor BMET, K+ this am 4.1  Chronic constipation - Miralax prn  Tobacco use d/o - Nicotine patch 21mg  daily  Opiate use d/o: On Methadone [facility] by [facility] [phone]). - Methadone dose 25mg  and schedule.  Last dose 10/2.  - Continue home dose while hospitalized.  Will need to fax [phone] w/ DC summary at discharge  FEN/GI: Regular, IV NS @150cc /hr Prophylaxis: Lovenox sub-q  Disposition: Continue to  monitor in stepdown, dispo pending clinical improvement  Subjective:  Patient reports she is feeling much better this AM but chest still feels a little tight.  Wheezing still noted on physical exam but patient is moving air well.  States she is concerned about the care of her 6 children at home, as she currently has her mother and sister taking care of them .  Otherwise no complaints this am.   Objective: Temp:  [97.3 F (36.3 C)-98.6 F (37 C)] 98.4 F (36.9 C) (10/04 1149) Pulse Rate:  [78-115] 90 (10/04 1149) Resp:  [17-24] 20 (10/04 0434) BP: (91-125)/(52-79) 122/78 (10/04 1149) SpO2:  [96 %-100 %] 100 % (10/04 1149) Weight:  [168 lb (76.2 kg)] 168 lb (76.2 kg) (10/03 1736)   Physical Exam:  General: awake, alert, in no acute distress Eyes: sclera white, EOMI, PERRL ENTM: MMM, no rhinorrhea, o/p clear Neck: supple, no LAD Cardiovascular: RRR, no m/r/g Respiratory: rhonchi throughout w. expiratory wheeze, normal effort Gastrointestinal: soft, NT/ND, +BS MSK: +2 posterior tibial pulses bilaterally, no edema Derm: no lesions/ rashes Neuro: follows commands, speech normal Psych: pleasant, mood stable, affect appropriate  Laboratory:  Recent Labs Lab 09/19/16 0228 09/20/16 0835  WBC 13.0* 12.4*  HGB 13.4 11.9*  HCT 41.7 37.1  PLT 286 324    Recent Labs Lab 09/19/16 0228 09/20/16 0835  NA 136 138  K 3.1* 4.1  CL 105 110  CO2 21* 23  BUN 6 6  CREATININE 0.71 0.58  CALCIUM 9.3 9.0  PROT 7.1  --   BILITOT 0.9  --   ALKPHOS 63  --   ALT 12*  --   AST 21  --  GLUCOSE 115* 138*   Imaging/Diagnostic Tests:  Dg Chest Port 1 View  Result Date: 09/20/2016 CLINICAL DATA:  Pneumonia. EXAM: PORTABLE CHEST 1 VIEW COMPARISON:  09/19/2016. FINDINGS: Mediastinum and hilar structures are normal. Mild right upper lobe infiltrate and atelectasis again noted. No significant interim change. Mild left base subsegmental atelectasis. No pleural effusion or pneumothorax. Heart  size stable. No acute bony abnormality . IMPRESSION: 1. Mild right upper lobe infiltrate and atelectasis again noted. No interim change. 2. Mild left base subsegmental atelectasis . Electronically Signed   By: Marcello Moores  Register   On: 09/20/2016 07:05   Lovenia Kim, MD 09/20/2016, 12:09 PM PGY-1, [facility] Intern pager: [phone], text pages welcome

## 2016-09-20 NOTE — Consult Note (Signed)
PULMONARY / CRITICAL CARE MEDICINE   Name: Rhonda Cabrera MRN: [id number] DOB: 06/23/1986    ADMISSION DATE:  09/19/2016 CONSULTATION DATE:  09/19/16  REFERRING MD:  edp  CHIEF COMPLAINT: Coughing, fever, sob  HISTORY OF PRESENT ILLNESS:   30 yo aaf, life long 1 ppd smoker, mild asthma ,bipolar, hx of polysubstance abuse(on methadone) who notes increased sob x 3 days,fever,  used son's nebulizer that helped, increased chest tightness, violent cough with 24 hrs of green sputum. She called 911 09/18/16 was given neb but did not go to hospital. 10/3 again calls EMS and presents to ED with hypotension, purulent green sputum and radiographic evidence of RUL pna. PCCM asked to evaluate. She is awake and alert mildly hypotensive but is on 4 th litre of IVF and her lactic acid has risen to 5.44 from 4.03. She will be admitted to SDU and PCCM will follow. Of note she started nicotine patch 10/2 because she could not smoke due to coughing.  SUBJECTIVE:  No distress  VITAL SIGNS: BP 125/79 (BP Location: Left Arm)   Pulse 83   Temp 98.1 F (36.7 C) (Oral)   Resp 20   Ht 5\' 7"  (1.702 m)   Wt 76.2 kg (168 lb)   LMP 08/26/2016   SpO2 100%   BMI 26.31 kg/m   HEMODYNAMICS:    VENTILATOR SETTINGS:    INTAKE / OUTPUT: I/O last 3 completed shifts: In: [id number] [I.V.:6020; IV Piggyback:1300] Out: -   PHYSICAL EXAMINATION: General: no distress Neuro:  Intact, A o x 4 HEENT: no stridor Cardiovascular:  HSR RRR Lungs: air movement improved Abdomen: Soft + bs Musculoskeletal:Intact Skin:  Warm and dry  LABS:  BMET  Recent Labs Lab 09/19/16 0228 09/20/16 0835  NA 136 138  K 3.1* 4.1  CL 105 110  CO2 21* 23  BUN 6 6  CREATININE 0.71 0.58  GLUCOSE 115* 138*    Electrolytes  Recent Labs Lab 09/19/16 0228 09/20/16 0835  CALCIUM 9.3 9.0  MG  --  1.9  PHOS  --  1.9*    CBC  Recent Labs Lab 09/19/16 0228 09/20/16 0835  WBC 13.0* 12.4*  HGB 13.4 11.9*  HCT 41.7  37.1  PLT 286 324    Coag's  Recent Labs Lab 09/19/16 0836 09/19/16 1346  APTT 33 30  INR 1.16 1.23    Sepsis Markers  Recent Labs Lab 09/19/16 0836  09/19/16 1345 09/19/16 1346 09/19/16 1825 09/19/16 2354  LATICACIDVEN  --   < > 1.9  --  0.9 0.6  PROCALCITON 0.10  --   --  <0.10  --   --   < > = values in this interval not displayed.  ABG No results for input(s): PHART, PCO2ART, PO2ART in the last 168 hours.  Liver Enzymes  Recent Labs Lab 09/19/16 0228  AST 21  ALT 12*  ALKPHOS 63  BILITOT 0.9  ALBUMIN 4.1    Cardiac Enzymes  Recent Labs Lab 09/19/16 1825 09/19/16 2354 09/20/16 0835  TROPONINI <0.03 <0.03 <0.03    Glucose No results for input(s): GLUCAP in the last 168 hours.  Imaging Dg Chest Port 1 View  Result Date: 09/20/2016 CLINICAL DATA:  Pneumonia. EXAM: PORTABLE CHEST 1 VIEW COMPARISON:  09/19/2016. FINDINGS: Mediastinum and hilar structures are normal. Mild right upper lobe infiltrate and atelectasis again noted. No significant interim change. Mild left base subsegmental atelectasis. No pleural effusion or pneumothorax. Heart size stable. No acute bony abnormality . IMPRESSION: 1.  Mild right upper lobe infiltrate and atelectasis again noted. No interim change. 2. Mild left base subsegmental atelectasis . Electronically Signed   By: Marcello Moores  Register   On: 09/20/2016 07:05     STUDIES:    CULTURES: 10/3 bc x 2>> 10/3 sputum>> 10/3 Flu panel>> 10/3 urine legionella > 10/3 strep ag >   ANTIBIOTICS: 10/3 roc>> 10/3 zithro>>  SIGNIFICANT EVENTS: 10/3 admit to [facility] 10/3 sepsis protocol activated @0845 . Lactic acid 5.44. 4000 cc of ns have been infused prior to PCCM arrival at 0730. BP improved.   LINES/TUBES:   DISCUSSION: 29 yo aaf, life long 1 ppd smoker, mild asthma ,bipolar, hx of polysubstance abuse(on methadone) who notes increased sob x 3 days,fever,  used son's nebulizer that helped, increased chest tightness, violent  cough with 24 hrs of green sputum. She called 911 09/18/16 was given neb but did not go to hospital. 10/3 again calls EMS and presents to ED with hypotension, purulent green sputum and radiographic evidence of RUL pna. PCCM asked to evaluate. She is awake and alert mildly hypotensive but is on 4 th litre of IVF and her lactic acid has risen to 5.44 from 4.03. She will be admitted to SDU and PCCM will follow.  ASSESSMENT / PLAN:  PULMONARY A: AE asthma  Continued tobacco abuse Appears to be viral pneumonitis, int process P:   We likely can dc ABX, see pct less 0.26 and clinical picture of viral pcxr follow up Steroids for pneumonitis /a sthma Inhaled roids crucial long term solumedrol to q12h  CARDIOVASCULAR A:  Hypotension resolved Lactic wnl Presumed sepsis  P:  Can reduce fluids Lactic lowest I have ever seen  INFECTIOUS A:   Presumed Pna- favor viral ( difuse int changes, pct less 0.25) Hx of frequent UTI's and vaginal infections Hx of STD's P:   Ensure viral panel sent Dc ceftriaxone likely can dc azithro in am , will follow one more pct  NEUROLOGIC A:   No acute neurological event Bipolar do Hx of polysubstance abuse P:   RASS goal: 0 Continue home psych medications On methadone  25 mg daily, consider to 10 bid while in house UDS pending   Lavon Paganini. Titus Mould, MD, [place] Pgr: [facility]

## 2016-09-20 NOTE — Progress Notes (Signed)
Doctor requested for pt to have telemetry. Floor where pt is to be transferred does not have any telemetry available. MD informed. Waiting on response.

## 2016-09-21 ENCOUNTER — Inpatient Hospital Stay (HOSPITAL_COMMUNITY): Payer: Medicare Other

## 2016-09-21 DIAGNOSIS — J189 Pneumonia, unspecified organism: Secondary | ICD-10-CM

## 2016-09-21 DIAGNOSIS — F17200 Nicotine dependence, unspecified, uncomplicated: Secondary | ICD-10-CM

## 2016-09-21 DIAGNOSIS — J4521 Mild intermittent asthma with (acute) exacerbation: Secondary | ICD-10-CM

## 2016-09-21 LAB — CBC
HEMATOCRIT: 34.1 % — AB (ref 36.0–46.0)
HEMOGLOBIN: 10.4 g/dL — AB (ref 12.0–15.0)
MCH: 27.4 pg (ref 26.0–34.0)
MCHC: 30.5 g/dL (ref 30.0–36.0)
MCV: 90 fL (ref 78.0–100.0)
Platelets: 328 10*3/uL (ref 150–400)
RBC: 3.79 MIL/uL — AB (ref 3.87–5.11)
RDW: 14 % (ref 11.5–15.5)
WBC: 12.7 10*3/uL — ABNORMAL HIGH (ref 4.0–10.5)

## 2016-09-21 LAB — BASIC METABOLIC PANEL
Anion gap: 8 (ref 5–15)
BUN: 8 mg/dL (ref 6–20)
CHLORIDE: 108 mmol/L (ref 101–111)
CO2: 23 mmol/L (ref 22–32)
Calcium: 9 mg/dL (ref 8.9–10.3)
Creatinine, Ser: 0.56 mg/dL (ref 0.44–1.00)
GFR calc Af Amer: >60 mL/min (ref >60)
GFR calc non Af Amer: >60 mL/min (ref >60)
Glucose, Bld: 135 mg/dL — ABNORMAL HIGH (ref 65–99)
POTASSIUM: 4.3 mmol/L (ref 3.5–5.1)
SODIUM: 139 mmol/L (ref 135–145)

## 2016-09-21 LAB — PROCALCITONIN: Procalcitonin: <0.1 ng/mL

## 2016-09-21 MED ORDER — GUAIFENESIN ER 600 MG PO TB12: 600.0000 mg | ORAL_TABLET | Freq: Two times a day (BID) | ORAL | 0 refills | Status: DC

## 2016-09-21 MED ORDER — BECLOMETHASONE DIPROPIONATE 80 MCG/ACT IN AERS: 1.0000 | INHALATION_SPRAY | Freq: Two times a day (BID) | RESPIRATORY_TRACT | 5 refills | Status: DC

## 2016-09-21 MED ORDER — NICOTINE 21 MG/24HR TD PT24: 21.0000 mg | MEDICATED_PATCH | Freq: Every day | TRANSDERMAL | 0 refills | Status: DC

## 2016-09-21 MED ORDER — PREDNISONE 20 MG PO TABS
40.0000 mg | ORAL_TABLET | Freq: Two times a day (BID) | ORAL | Status: DC
  Administered 2016-09-21 (×2): 40 mg via ORAL
  Filled 2016-09-21 (×2): qty 2

## 2016-09-21 MED ORDER — GUAIFENESIN ER 600 MG PO TB12
600.0000 mg | ORAL_TABLET | Freq: Two times a day (BID) | ORAL | Status: DC
  Administered 2016-09-21: 600 mg via ORAL
  Filled 2016-09-21: qty 1

## 2016-09-21 MED ORDER — MONTELUKAST SODIUM 10 MG PO TABS: 10.0000 mg | ORAL_TABLET | Freq: Every day | ORAL | 5 refills | Status: DC

## 2016-09-21 MED ORDER — FLUTICASONE-SALMETEROL 500-50 MCG/DOSE IN AEPB: 1.0000 | INHALATION_SPRAY | Freq: Two times a day (BID) | RESPIRATORY_TRACT | 5 refills | Status: DC

## 2016-09-21 MED ORDER — PREDNISONE 20 MG PO TABS: 40.0000 mg | ORAL_TABLET | Freq: Two times a day (BID) | ORAL | 0 refills | Status: DC

## 2016-09-21 MED ORDER — ALBUTEROL SULFATE HFA 108 (90 BASE) MCG/ACT IN AERS: 2.0000 | INHALATION_SPRAY | Freq: Four times a day (QID) | RESPIRATORY_TRACT | 2 refills | Status: DC | PRN

## 2016-09-21 NOTE — Progress Notes (Signed)
Confirmed with Dr. Parks Ranger pt is discharged home

## 2016-09-21 NOTE — Progress Notes (Signed)
Pt is up to bathroom and continues to unhook her telemetry.

## 2016-09-21 NOTE — Progress Notes (Signed)
Walked around the unit. Maintained sats between 95-98%. Had no issues walking or issues with breathing while walking.

## 2016-09-21 NOTE — Care Management Note (Signed)
Case Management Note  Patient Details  Name: Rhonda Cabrera MRN: [id number] Date of Birth: 1986/10/12  Subjective/Objective:     Adm w sepsis              Action/Plan:lives w husband   Expected Discharge Date:                  Expected Discharge Plan:  Home/Self Care  In-House Referral:     Discharge planning Services     Post Acute Care Choice:    Choice offered to:     DME Arranged:    DME Agency:     HH Arranged:    HH Agency:     Status of Service:  In process, will continue to follow  If discussed at Long Length of Stay Meetings, dates discussed:    Additional Comments:following for dc needs as pt progresses.  Lacretia Leigh, RN 09/21/2016, 10:58 AM

## 2016-09-21 NOTE — Progress Notes (Signed)
Pt unhooks telemetry to go to bathroom. Central Telemetry is informed of pt's actions.

## 2016-09-21 NOTE — Discharge Instructions (Signed)
You were admitted for asthma exacerbation secondary to a viral illness.  You are being discharged on Prednisone to take for the next week.  You have a follow up appointment with Dr Gerlean Ren at [facility] center in 1 week.  Your EKG was slightly abnormal during your admission.  The cardiologist recommends that you have an ultrasound of your heart for safe measure outpatient.  Dr Gerlean Ren will be able to order this.    Asthma, Adult Asthma is a recurring condition in which the airways tighten and narrow. Asthma can make it difficult to breathe. It can cause coughing, wheezing, and shortness of breath. Asthma episodes, also called asthma attacks, range from minor to life-threatening. Asthma cannot be cured, but medicines and lifestyle changes can help control it. CAUSES Asthma is believed to be caused by inherited (genetic) and environmental factors, but its exact cause is unknown. Asthma may be triggered by allergens, lung infections, or irritants in the air. Asthma triggers are different for each person. Common triggers include:   Animal dander.  Dust mites.  Cockroaches.  Pollen from trees or grass.  Mold.  Smoke.  Air pollutants such as dust, household cleaners, hair sprays, aerosol sprays, paint fumes, strong chemicals, or strong odors.  Cold air, weather changes, and winds (which increase molds and pollens in the air).  Strong emotional expressions such as crying or laughing hard.  Stress.  Certain medicines (such as aspirin) or types of drugs (such as beta-blockers).  Sulfites in foods and drinks. Foods and drinks that may contain sulfites include dried fruit, potato chips, and sparkling grape juice.  Infections or inflammatory conditions such as the flu, a cold, or an inflammation of the nasal membranes (rhinitis).  Gastroesophageal reflux disease (GERD).  Exercise or strenuous activity. SYMPTOMS Symptoms may occur immediately after asthma is triggered or many hours  later. Symptoms include:  Wheezing.  Excessive nighttime or early morning coughing.  Frequent or severe coughing with a common cold.  Chest tightness.  Shortness of breath. DIAGNOSIS  The diagnosis of asthma is made by a review of your medical history and a physical exam. Tests may also be performed. These may include:  Lung function studies. These tests show how much air you breathe in and out.  Allergy tests.  Imaging tests such as X-rays. TREATMENT  Asthma cannot be cured, but it can usually be controlled. Treatment involves identifying and avoiding your asthma triggers. It also involves medicines. There are 2 classes of medicine used for asthma treatment:   Controller medicines. These prevent asthma symptoms from occurring. They are usually taken every day.  Reliever or rescue medicines. These quickly relieve asthma symptoms. They are used as needed and provide short-term relief. Your health care provider will help you create an asthma action plan. An asthma action plan is a written plan for managing and treating your asthma attacks. It includes a list of your asthma triggers and how they may be avoided. It also includes information on when medicines should be taken and when their dosage should be changed. An action plan may also involve the use of a device called a peak flow meter. A peak flow meter measures how well the lungs are working. It helps you monitor your condition. HOME CARE INSTRUCTIONS   Take medicines only as directed by your health care provider. Speak with your health care provider if you have questions about how or when to take the medicines.  Use a peak flow meter as directed by your health care  provider. Record and keep track of readings.  Understand and use the action plan to help minimize or stop an asthma attack without needing to seek medical care.  Control your home environment in the following ways to help prevent asthma attacks:  Do not smoke. Avoid  being exposed to secondhand smoke.  Change your heating and air conditioning filter regularly.  Limit your use of fireplaces and wood stoves.  Get rid of pests (such as roaches and mice) and their droppings.  Throw away plants if you see mold on them.  Clean your floors and dust regularly. Use unscented cleaning products.  Try to have someone else vacuum for you regularly. Stay out of rooms while they are being vacuumed and for a short while afterward. If you vacuum, use a dust mask from a hardware store, a double-layered or microfilter vacuum cleaner bag, or a vacuum cleaner with a HEPA filter.  Replace carpet with wood, tile, or vinyl flooring. Carpet can trap dander and dust.  Use allergy-proof pillows, mattress covers, and box spring covers.  Wash bed sheets and blankets every week in hot water and dry them in a dryer.  Use blankets that are made of polyester or cotton.  Clean bathrooms and kitchens with bleach. If possible, have someone repaint the walls in these rooms with mold-resistant paint. Keep out of the rooms that are being cleaned and painted.  Wash hands frequently. SEEK MEDICAL CARE IF:   You have wheezing, shortness of breath, or a cough even if taking medicine to prevent attacks.  The colored mucus you cough up (sputum) is thicker than usual.  Your sputum changes from clear or white to yellow, green, gray, or bloody.  You have any problems that may be related to the medicines you are taking (such as a rash, itching, swelling, or trouble breathing).  You are using a reliever medicine more than 2-3 times per week.  Your peak flow is still at 50-79% of your personal best after following your action plan for 1 hour.  You have a fever. SEEK IMMEDIATE MEDICAL CARE IF:   You seem to be getting worse and are unresponsive to treatment during an asthma attack.  You are short of breath even at rest.  You get short of breath when doing very little physical  activity.  You have difficulty eating, drinking, or talking due to asthma symptoms.  You develop chest pain.  You develop a fast heartbeat.  You have a bluish color to your lips or fingernails.  You are light-headed, dizzy, or faint.  Your peak flow is less than 50% of your personal best.   This information is not intended to replace advice given to you by your health care provider. Make sure you discuss any questions you have with your health care provider.   Document Released: 12/04/2005 Document Revised: 08/25/2015 Document Reviewed: 07/03/2013 Elsevier Interactive Patient Education [employer].
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
## 2016-09-24 LAB — URINE DRUGS OF ABUSE SCREEN W ALC, ROUTINE (REF LAB)
AMPHETAMINES, URINE: NEGATIVE ng/mL
BARBITURATE, UR: NEGATIVE ng/mL
BENZODIAZEPINE QUANT UR: NEGATIVE ng/mL
CANNABINOID QUANT UR: NEGATIVE ng/mL
COCAINE (METAB.): NEGATIVE ng/mL
ETHANOL U, QUAN: NEGATIVE %
Opiate Quant, Ur: NEGATIVE ng/mL
Phencyclidine, Ur: NEGATIVE ng/mL
Propoxyphene, Urine: NEGATIVE ng/mL

## 2016-09-24 LAB — CULTURE, BLOOD (ROUTINE X 2)
CULTURE: NO GROWTH
CULTURE: NO GROWTH

## 2016-09-24 LAB — METHADONE CONFIRMATION, URINE
METHADONE GC/MS CONF: 3055 ng/mL
METHADONE: POSITIVE — AB

## 2016-09-26 NOTE — Progress Notes (Signed)
Subjective:     Patient ID: Rhonda Cabrera, female   DOB: January 02, 1986, 30 y.o.   MRN: [id number]  HPI Mrs. Rhonda Cabrera is a 30yo female presenting today for hospital follow up. - Hospitalization from 09/19/16 to 09/21/16. Met Septic Criteria and protocol initiated. CXR showed RUL opacity concerning for pneumonia. Received Rocephin and Azithromycin. RVP positive for Rhinovirus/Enterovirus. Antibiotics discontinued due to suspected viral etiology of symptoms. EKG with asymptomatic bradycardia to 50s. Inpatient team recommends Echocardiogram. - Note elevated glucose, dropping hemoglobin on review of labs during hospitalization - Reports improvement since discharge. Continues to note mild shortness of breath and productive cough, but improved. - Denies fever - Continues to smoke, less than one pack per day - Reporting worsening left back, buttock, and hip pain with radiation down back of left leg. Notes some tingling. Denies incontinence, saddle anesthesia. Pain in lateral left hip does awaken at night, but not back pain. - PAP smear 02/2015 with ASCUS and high risk HPV  Review of Systems Per HPI.    Objective:   Physical Exam  Constitutional: She appears well-developed and well-nourished. No distress.  HENT:  Head: Normocephalic and atraumatic.  Cardiovascular: Normal rate and regular rhythm.   No murmur heard. Pulmonary/Chest: Effort normal and breath sounds normal. No respiratory distress. She has no wheezes.  Abdominal: Soft. She exhibits no distension. There is no tenderness.  Musculoskeletal:  Midline lower back tenderness noted. Some tenderness over left PSIS and over left piriformis with reproduction of symptoms down left leg. Tenderness over left greater trochanter. Muscle strength 5/5 in lower extremities.  Neurological:  Sensation intact. Patellar DTR normal bilaterally      Assessment and Plan:     1. Viral Pneumonitis: - Improving. Normal exam. Return precautions given.  2.  Low Back Pain with Radiation: - No red flag symptoms - Xray lumbar spine - Depo + Toradol injection today - NSAIDs, rest - Piriformis Exercises given - Suspect Greater Trochanteric Bursitis contributing. Consider injection at future visit if continues to be symptomatic--refusing today. - Follow up in 4 weeks if no improvement.     3. Anemia: - Noted during hospitalization - CBC today  4. Hyperglycemia: - Noted during hospitalization - A1C today  5. Bradycardia - Noted during hospitalization. Inpatient team recommends Echo. Ordered.

## 2016-09-27 ENCOUNTER — Ambulatory Visit (INDEPENDENT_AMBULATORY_CARE_PROVIDER_SITE_OTHER): Payer: Medicare Other | Admitting: Family Medicine

## 2016-09-27 VITALS — BP 114/72 | HR 99 | Temp 98.9°F | Ht 67.0 in | Wt 160.0 lb

## 2016-09-27 DIAGNOSIS — M545 Low back pain, unspecified: Secondary | ICD-10-CM

## 2016-09-27 DIAGNOSIS — Z87898 Personal history of other specified conditions: Secondary | ICD-10-CM

## 2016-09-27 DIAGNOSIS — R739 Hyperglycemia, unspecified: Secondary | ICD-10-CM

## 2016-09-27 DIAGNOSIS — Z8679 Personal history of other diseases of the circulatory system: Secondary | ICD-10-CM

## 2016-09-27 DIAGNOSIS — R9431 Abnormal electrocardiogram [ECG] [EKG]: Secondary | ICD-10-CM

## 2016-09-27 DIAGNOSIS — J129 Viral pneumonia, unspecified: Secondary | ICD-10-CM

## 2016-09-27 DIAGNOSIS — M5440 Lumbago with sciatica, unspecified side: Secondary | ICD-10-CM

## 2016-09-27 DIAGNOSIS — D508 Other iron deficiency anemias: Secondary | ICD-10-CM

## 2016-09-27 LAB — CBC
HCT: 40.8 % (ref 35.0–45.0)
Hemoglobin: 13.1 g/dL (ref 11.7–15.5)
MCH: 28.1 pg (ref 27.0–33.0)
MCHC: 32.1 g/dL (ref 32.0–36.0)
MCV: 87.6 fL (ref 80.0–100.0)
MPV: 8.2 fL (ref 7.5–12.5)
PLATELETS: 455 10*3/uL — AB (ref 140–400)
RBC: 4.66 MIL/uL (ref 3.80–5.10)
RDW: 13.5 % (ref 11.0–15.0)
WBC: 19.2 10*3/uL — AB (ref 3.8–10.8)

## 2016-09-27 LAB — POCT GLYCOSYLATED HEMOGLOBIN (HGB A1C): Hemoglobin A1C: 5.6

## 2016-09-27 NOTE — Patient Instructions (Addendum)
Thank you so much for coming to visit me today! I'm glad your breathing is doing so much better! We will check several labs today. We will contact you with the results. Your last pap smear was abnormal. It is VERY important that you follow up for colposcopy to check for cervical cancer. We will treat your back pain conservatively for now. We will obtain xrays of your lower spine. We will give you a shot of Toradol and steroids here in the office and then you may use Aleve for pain at home starting tomorrow. I will give you some exercises to do to help. If you would like an injection of your hip, please return. Please let us know if your symptoms are not better over the next 4 weeks.  Dr. Gerlean Ren

## 2016-09-28 MED ORDER — METHYLPREDNISOLONE ACETATE 40 MG/ML IJ SUSP
40.0000 mg | Freq: Once | INTRAMUSCULAR | Status: AC
  Administered 2016-09-27: 40 mg via INTRAMUSCULAR

## 2016-09-28 MED ORDER — KETOROLAC TROMETHAMINE 30 MG/ML IM SOLN: 30.0000 mg | Freq: Once | INTRAMUSCULAR | 0 refills | Status: DC

## 2016-09-28 MED ORDER — KETOROLAC TROMETHAMINE 30 MG/ML IJ SOLN: 30.0000 mg | Freq: Once | INTRAMUSCULAR | Status: AC

## 2016-09-28 NOTE — Addendum Note (Signed)
Addended by: Leonia Corona R on: 09/28/2016 08:31 AM   Modules accepted: Orders

## 2016-10-19 ENCOUNTER — Ambulatory Visit: Payer: Medicare Other

## 2016-10-27 ENCOUNTER — Encounter (HOSPITAL_COMMUNITY): Payer: Self-pay | Admitting: Emergency Medicine

## 2016-10-27 ENCOUNTER — Emergency Department (HOSPITAL_COMMUNITY)
Admission: EM | Admit: 2016-10-27 | Discharge: 2016-10-27 | Disposition: A | Discharge status: Home / Self Care | Payer: Medicare Other | Attending: Emergency Medicine | Admitting: Emergency Medicine

## 2016-10-27 DIAGNOSIS — R109 Unspecified abdominal pain: Secondary | ICD-10-CM | POA: Diagnosis present

## 2016-10-27 DIAGNOSIS — N760 Acute vaginitis: Secondary | ICD-10-CM | POA: Diagnosis not present

## 2016-10-27 DIAGNOSIS — I10 Essential (primary) hypertension: Secondary | ICD-10-CM | POA: Insufficient documentation

## 2016-10-27 DIAGNOSIS — J45909 Unspecified asthma, uncomplicated: Secondary | ICD-10-CM | POA: Diagnosis not present

## 2016-10-27 DIAGNOSIS — F17210 Nicotine dependence, cigarettes, uncomplicated: Secondary | ICD-10-CM | POA: Insufficient documentation

## 2016-10-27 DIAGNOSIS — B9689 Other specified bacterial agents as the cause of diseases classified elsewhere: Secondary | ICD-10-CM | POA: Insufficient documentation

## 2016-10-27 DIAGNOSIS — N3000 Acute cystitis without hematuria: Secondary | ICD-10-CM | POA: Diagnosis not present

## 2016-10-27 LAB — URINALYSIS, ROUTINE W REFLEX MICROSCOPIC
Glucose, UA: NEGATIVE mg/dL
Hgb urine dipstick: NEGATIVE
Ketones, ur: 15 mg/dL — AB
NITRITE: POSITIVE — AB
PH: 5 (ref 5.0–8.0)
Protein, ur: 100 mg/dL — AB
SPECIFIC GRAVITY, URINE: 1.03 (ref 1.005–1.030)

## 2016-10-27 LAB — COMPREHENSIVE METABOLIC PANEL
ALBUMIN: 4.3 g/dL (ref 3.5–5.0)
ALK PHOS: 50 U/L (ref 38–126)
ALT: 12 U/L — AB (ref 14–54)
AST: 19 U/L (ref 15–41)
Anion gap: 9 (ref 5–15)
BILIRUBIN TOTAL: 0.4 mg/dL (ref 0.3–1.2)
BUN: 13 mg/dL (ref 6–20)
CO2: 26 mmol/L (ref 22–32)
CREATININE: 0.77 mg/dL (ref 0.44–1.00)
Calcium: 9.5 mg/dL (ref 8.9–10.3)
Chloride: 104 mmol/L (ref 101–111)
GFR calc Af Amer: >60 mL/min (ref >60)
GLUCOSE: 99 mg/dL (ref 65–99)
POTASSIUM: 3 mmol/L — AB (ref 3.5–5.1)
Sodium: 139 mmol/L (ref 135–145)
TOTAL PROTEIN: 6.8 g/dL (ref 6.5–8.1)

## 2016-10-27 LAB — CBC
HEMATOCRIT: 40.6 % (ref 36.0–46.0)
Hemoglobin: 13.2 g/dL (ref 12.0–15.0)
MCH: 28.3 pg (ref 26.0–34.0)
MCHC: 32.5 g/dL (ref 30.0–36.0)
MCV: 86.9 fL (ref 78.0–100.0)
PLATELETS: 316 10*3/uL (ref 150–400)
RBC: 4.67 MIL/uL (ref 3.87–5.11)
RDW: 13.4 % (ref 11.5–15.5)
WBC: 4.3 10*3/uL (ref 4.0–10.5)

## 2016-10-27 LAB — POC URINE PREG, ED: Preg Test, Ur: NEGATIVE

## 2016-10-27 LAB — URINE MICROSCOPIC-ADD ON: RBC / HPF: NONE SEEN RBC/hpf (ref 0–5)

## 2016-10-27 LAB — WET PREP, GENITAL
SPERM: NONE SEEN
Trich, Wet Prep: NONE SEEN
YEAST WET PREP: NONE SEEN

## 2016-10-27 LAB — LIPASE, BLOOD: Lipase: 20 U/L (ref 11–51)

## 2016-10-27 LAB — RPR: RPR Ser Ql: NONREACTIVE

## 2016-10-27 LAB — GC/CHLAMYDIA PROBE AMP (~~LOC~~) NOT AT ARMC
CHLAMYDIA, DNA PROBE: NEGATIVE
NEISSERIA GONORRHEA: NEGATIVE

## 2016-10-27 LAB — HIV ANTIBODY (ROUTINE TESTING W REFLEX): HIV Screen 4th Generation wRfx: NONREACTIVE

## 2016-10-27 MED ORDER — IBUPROFEN 800 MG PO TABS
800.0000 mg | ORAL_TABLET | Freq: Once | ORAL | Status: AC
  Administered 2016-10-27: 800 mg via ORAL
  Filled 2016-10-27: qty 1

## 2016-10-27 MED ORDER — SODIUM CHLORIDE 0.9 % IV BOLUS (SEPSIS)
1000.0000 mL | Freq: Once | INTRAVENOUS | Status: AC
  Administered 2016-10-27: 1000 mL via INTRAVENOUS

## 2016-10-27 MED ORDER — CEPHALEXIN 250 MG PO CAPS
500.0000 mg | ORAL_CAPSULE | Freq: Once | ORAL | Status: AC
  Administered 2016-10-27: 500 mg via ORAL
  Filled 2016-10-27: qty 2

## 2016-10-27 MED ORDER — POTASSIUM CHLORIDE CRYS ER 20 MEQ PO TBCR
40.0000 meq | EXTENDED_RELEASE_TABLET | Freq: Once | ORAL | Status: AC
Start: 2016-10-27 — End: 2016-10-27
  Administered 2016-10-27: 40 meq via ORAL
  Filled 2016-10-27: qty 2

## 2016-10-27 MED ORDER — FLUCONAZOLE 150 MG PO TABS: 150.0000 mg | ORAL_TABLET | Freq: Once | ORAL | 1 refills | Status: AC

## 2016-10-27 MED ORDER — METRONIDAZOLE 500 MG PO TABS: 500.0000 mg | ORAL_TABLET | Freq: Two times a day (BID) | ORAL | 0 refills | Status: DC

## 2016-10-27 MED ORDER — CEPHALEXIN 500 MG PO CAPS
500.0000 mg | ORAL_CAPSULE | Freq: Four times a day (QID) | ORAL | 0 refills | Status: DC
Start: 2016-10-27 — End: 2017-01-06

## 2016-10-27 NOTE — ED Notes (Signed)
Pt requested to get screened for STDs

## 2016-10-27 NOTE — Discharge Instructions (Signed)
It was my pleasure taking care of you today!   Please take all of your antibiotics until finished! I would recommend taking this medication with a probiotic or yogurt to prevent abdominal discomfort. Stay very well hydrated, drinking plenty of water throughout the day. Please follow up with your primary care physician or OBGYN for discussion of today's diagnosis. You were tested for gonorrhea, chlamydia, HIV and syphilis today. These results will be available in approximately 3 days. He will be notified if they're positive.  Please seek immediate care if you develop the following: Your symptoms are no better or worse in 3 days. There is severe back pain or lower abdominal pain.  You develop chills.  You have a fever.  There is nausea or vomiting.  There is continued burning or discomfort with urination.

## 2016-10-27 NOTE — ED Triage Notes (Signed)
Pt. reports generalized abdominal discomfort with dysuria and vaginal itching and irritations for several days .
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
## 2017-01-02 ENCOUNTER — Telehealth: Payer: Self-pay | Admitting: Family Medicine

## 2017-01-02 NOTE — Telephone Encounter (Signed)
Will forward to PCP.  Martin, Tamika L, RN  

## 2017-01-02 NOTE — Telephone Encounter (Signed)
Has UTI.  Would like a RX called in for it.Doesnt have transportation to come in for a visit.   [facility] on [address].    Please let pt knoe.

## 2017-01-02 NOTE — Telephone Encounter (Signed)
2nd request. ep °

## 2017-01-05 NOTE — Telephone Encounter (Signed)
3rd request. Pt states she will go to Urgent Care. ep

## 2017-01-05 NOTE — Telephone Encounter (Signed)
Unable to give antibiotics without evaluation. Recommend following up for office visit or going to Urgent Care for further evaluation.

## 2017-01-06 ENCOUNTER — Encounter (HOSPITAL_BASED_OUTPATIENT_CLINIC_OR_DEPARTMENT_OTHER): Payer: Self-pay | Admitting: *Deleted

## 2017-01-06 ENCOUNTER — Emergency Department (HOSPITAL_BASED_OUTPATIENT_CLINIC_OR_DEPARTMENT_OTHER)
Admission: EM | Admit: 2017-01-06 | Discharge: 2017-01-06 | Disposition: A | Discharge status: Home / Self Care | Payer: Medicare Other | Attending: Emergency Medicine | Admitting: Emergency Medicine

## 2017-01-06 DIAGNOSIS — N3000 Acute cystitis without hematuria: Secondary | ICD-10-CM | POA: Diagnosis not present

## 2017-01-06 DIAGNOSIS — I10 Essential (primary) hypertension: Secondary | ICD-10-CM | POA: Diagnosis not present

## 2017-01-06 DIAGNOSIS — F17210 Nicotine dependence, cigarettes, uncomplicated: Secondary | ICD-10-CM | POA: Insufficient documentation

## 2017-01-06 DIAGNOSIS — R350 Frequency of micturition: Secondary | ICD-10-CM | POA: Diagnosis present

## 2017-01-06 DIAGNOSIS — J45909 Unspecified asthma, uncomplicated: Secondary | ICD-10-CM | POA: Diagnosis not present

## 2017-01-06 LAB — URINALYSIS, ROUTINE W REFLEX MICROSCOPIC
Glucose, UA: NEGATIVE mg/dL
HGB URINE DIPSTICK: NEGATIVE
KETONES UR: 15 mg/dL — AB
NITRITE: POSITIVE — AB
PH: 5.5 (ref 5.0–8.0)
Protein, ur: 30 mg/dL — AB
Specific Gravity, Urine: 1.027 (ref 1.005–1.030)

## 2017-01-06 LAB — URINALYSIS, MICROSCOPIC (REFLEX): RBC / HPF: NONE SEEN RBC/hpf (ref 0–5)

## 2017-01-06 LAB — PREGNANCY, URINE: Preg Test, Ur: NEGATIVE

## 2017-01-06 MED ORDER — PHENAZOPYRIDINE HCL 100 MG PO TABS
ORAL_TABLET | ORAL | Status: AC
  Filled 2017-01-06: qty 1

## 2017-01-06 MED ORDER — NITROFURANTOIN MONOHYD MACRO 100 MG PO CAPS
100.0000 mg | ORAL_CAPSULE | Freq: Once | ORAL | Status: AC
  Administered 2017-01-06: 100 mg via ORAL
  Filled 2017-01-06: qty 1

## 2017-01-06 MED ORDER — PHENAZOPYRIDINE HCL 100 MG PO TABS
200.0000 mg | ORAL_TABLET | Freq: Once | ORAL | Status: AC
  Administered 2017-01-06: 200 mg via ORAL
  Filled 2017-01-06: qty 2

## 2017-01-06 MED ORDER — PHENAZOPYRIDINE HCL 200 MG PO TABS: 200.0000 mg | ORAL_TABLET | Freq: Three times a day (TID) | ORAL | 0 refills | Status: DC

## 2017-01-06 MED ORDER — NITROFURANTOIN MONOHYD MACRO 100 MG PO CAPS: 100.0000 mg | ORAL_CAPSULE | Freq: Two times a day (BID) | ORAL | 0 refills | Status: DC

## 2017-01-06 NOTE — ED Triage Notes (Signed)
Pt with UTI sx frequency urgency and burning upon urination. Pt with hx of UTI also wants to be tested for STD as she had unprotected sex recently.
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
## 2017-01-08 LAB — GC/CHLAMYDIA PROBE AMP (~~LOC~~) NOT AT ARMC
CHLAMYDIA, DNA PROBE: NEGATIVE
Neisseria Gonorrhea: NEGATIVE

## 2017-01-27 ENCOUNTER — Encounter (HOSPITAL_COMMUNITY): Payer: Self-pay | Admitting: Emergency Medicine

## 2017-01-27 ENCOUNTER — Emergency Department (HOSPITAL_COMMUNITY)
Admission: EM | Admit: 2017-01-27 | Discharge: 2017-01-27 | Disposition: A | Discharge status: Home / Self Care | Payer: Medicare Other | Attending: Emergency Medicine | Admitting: Emergency Medicine

## 2017-01-27 DIAGNOSIS — I10 Essential (primary) hypertension: Secondary | ICD-10-CM | POA: Insufficient documentation

## 2017-01-27 DIAGNOSIS — Z813 Family history of other psychoactive substance abuse and dependence: Secondary | ICD-10-CM | POA: Diagnosis not present

## 2017-01-27 DIAGNOSIS — N939 Abnormal uterine and vaginal bleeding, unspecified: Secondary | ICD-10-CM

## 2017-01-27 DIAGNOSIS — F332 Major depressive disorder, recurrent severe without psychotic features: Secondary | ICD-10-CM

## 2017-01-27 DIAGNOSIS — F17210 Nicotine dependence, cigarettes, uncomplicated: Secondary | ICD-10-CM | POA: Diagnosis not present

## 2017-01-27 DIAGNOSIS — R45851 Suicidal ideations: Secondary | ICD-10-CM

## 2017-01-27 DIAGNOSIS — Z79899 Other long term (current) drug therapy: Secondary | ICD-10-CM | POA: Diagnosis not present

## 2017-01-27 DIAGNOSIS — J45909 Unspecified asthma, uncomplicated: Secondary | ICD-10-CM | POA: Insufficient documentation

## 2017-01-27 DIAGNOSIS — N9489 Other specified conditions associated with female genital organs and menstrual cycle: Secondary | ICD-10-CM | POA: Diagnosis not present

## 2017-01-27 DIAGNOSIS — J189 Pneumonia, unspecified organism: Secondary | ICD-10-CM

## 2017-01-27 DIAGNOSIS — F329 Major depressive disorder, single episode, unspecified: Secondary | ICD-10-CM | POA: Insufficient documentation

## 2017-01-27 DIAGNOSIS — F29 Unspecified psychosis not due to a substance or known physiological condition: Secondary | ICD-10-CM | POA: Diagnosis not present

## 2017-01-27 LAB — URINALYSIS, ROUTINE W REFLEX MICROSCOPIC
BILIRUBIN URINE: NEGATIVE
Glucose, UA: NEGATIVE mg/dL
KETONES UR: NEGATIVE mg/dL
NITRITE: NEGATIVE
PH: 6 (ref 5.0–8.0)
Protein, ur: 100 mg/dL — AB
SQUAMOUS EPITHELIAL / LPF: NONE SEEN
Specific Gravity, Urine: 1.006 (ref 1.005–1.030)

## 2017-01-27 LAB — COMPREHENSIVE METABOLIC PANEL
ALT: 11 U/L — AB (ref 14–54)
AST: 14 U/L — AB (ref 15–41)
Albumin: 4.1 g/dL (ref 3.5–5.0)
Alkaline Phosphatase: 70 U/L (ref 38–126)
Anion gap: 11 (ref 5–15)
BILIRUBIN TOTAL: 0.2 mg/dL — AB (ref 0.3–1.2)
BUN: 10 mg/dL (ref 6–20)
CALCIUM: 9.6 mg/dL (ref 8.9–10.3)
CHLORIDE: 101 mmol/L (ref 101–111)
CO2: 24 mmol/L (ref 22–32)
CREATININE: 0.74 mg/dL (ref 0.44–1.00)
Glucose, Bld: 123 mg/dL — ABNORMAL HIGH (ref 65–99)
Potassium: 4.2 mmol/L (ref 3.5–5.1)
Sodium: 136 mmol/L (ref 135–145)
TOTAL PROTEIN: 6.9 g/dL (ref 6.5–8.1)

## 2017-01-27 LAB — RAPID URINE DRUG SCREEN, HOSP PERFORMED
Amphetamines: NOT DETECTED
BARBITURATES: NOT DETECTED
Benzodiazepines: NOT DETECTED
Cocaine: NOT DETECTED
Opiates: NOT DETECTED
TETRAHYDROCANNABINOL: POSITIVE — AB

## 2017-01-27 LAB — ETHANOL

## 2017-01-27 LAB — HIV ANTIBODY (ROUTINE TESTING W REFLEX): HIV Screen 4th Generation wRfx: NONREACTIVE

## 2017-01-27 LAB — RPR: RPR Ser Ql: NONREACTIVE

## 2017-01-27 LAB — ACETAMINOPHEN LEVEL: Acetaminophen (Tylenol), Serum: <10 ug/mL — ABNORMAL LOW (ref 10–30)

## 2017-01-27 LAB — CBC
HCT: 38.7 % (ref 36.0–46.0)
Hemoglobin: 12.8 g/dL (ref 12.0–15.0)
MCH: 29 pg (ref 26.0–34.0)
MCHC: 33.1 g/dL (ref 30.0–36.0)
MCV: 87.8 fL (ref 78.0–100.0)
PLATELETS: 303 10*3/uL (ref 150–400)
RBC: 4.41 MIL/uL (ref 3.87–5.11)
RDW: 13.4 % (ref 11.5–15.5)
WBC: 7.7 10*3/uL (ref 4.0–10.5)

## 2017-01-27 LAB — HCG, SERUM, QUALITATIVE: PREG SERUM: NEGATIVE

## 2017-01-27 LAB — SALICYLATE LEVEL

## 2017-01-27 MED ORDER — CEFTRIAXONE SODIUM 250 MG IJ SOLR
250.0000 mg | Freq: Once | INTRAMUSCULAR | Status: DC
  Filled 2017-01-27: qty 250

## 2017-01-27 MED ORDER — QUETIAPINE FUMARATE 200 MG PO TABS: 200.0000 mg | ORAL_TABLET | Freq: Every day | ORAL | Status: DC

## 2017-01-27 MED ORDER — BUDESONIDE 0.25 MG/2ML IN SUSP
0.2500 mg | Freq: Two times a day (BID) | RESPIRATORY_TRACT | Status: DC
Start: 2017-01-27 — End: 2017-01-27
  Administered 2017-01-27: 0.25 mg via RESPIRATORY_TRACT
  Filled 2017-01-27 (×2): qty 2

## 2017-01-27 MED ORDER — GABAPENTIN 300 MG PO CAPS
600.0000 mg | ORAL_CAPSULE | Freq: Three times a day (TID) | ORAL | Status: DC
  Administered 2017-01-27: 600 mg via ORAL
  Filled 2017-01-27: qty 2

## 2017-01-27 MED ORDER — BECLOMETHASONE DIPROPIONATE 80 MCG/ACT IN AERS
1.0000 | INHALATION_SPRAY | Freq: Two times a day (BID) | RESPIRATORY_TRACT | Status: DC
  Filled 2017-01-27: qty 8.7

## 2017-01-27 MED ORDER — AZITHROMYCIN 250 MG PO TABS
1000.0000 mg | ORAL_TABLET | Freq: Once | ORAL | Status: DC
  Filled 2017-01-27: qty 4

## 2017-01-27 NOTE — ED Notes (Addendum)
Pt called her sister for ride from ED. Pt to wait in room for ride d/t has no shoes.

## 2017-01-27 NOTE — ED Notes (Signed)
Pt's sister called and advised is outside of ED. Pt aware and escorted to waiting room.

## 2017-01-27 NOTE — ED Notes (Signed)
Pt on phone at nurses' desk talking w/her mother.  
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
## 2017-01-27 NOTE — ED Notes (Signed)
Dr Eulis Foster in w/pt.

## 2017-01-27 NOTE — ED Triage Notes (Signed)
Per [facility], pt was picked up at gas station with c/o suicidal ideations. Pt was without shoes, stated she had 6 kids at home with no supervision. Pt states she is depressed, "just wants to die", does not have a plan. Pt states she had similar episode 5 years ago. Hx of bipolar, anxiety.

## 2017-01-27 NOTE — ED Notes (Addendum)
Menstrual pad given as requested. Pt did not eat breakfast.

## 2017-01-27 NOTE — Discharge Instructions (Signed)
Follow-up for mental health treatment as recommended on the resource guide.  For the vaginal bleeding, follow-up with a gynecologist or your primary care doctor.

## 2017-01-27 NOTE — ED Notes (Signed)
Lying on stretcher watching tv.
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
## 2017-01-27 NOTE — ED Notes (Signed)
Signature pad not working. 

## 2017-01-27 NOTE — ED Notes (Addendum)
Pt wanded by security. Pt's belongings accounted for and placed in pt's room in locked cabinets.

## 2017-01-27 NOTE — ED Notes (Signed)
Dinner order placed 

## 2017-01-27 NOTE — ED Notes (Addendum)
Per Dr Eulis Foster, pt to be d/c'd. Pt signed NO HARM CONTRACT - copy given to pt - original to Medical Records. Belongings - 1 labeled belongings bag - returned to pt - signed verifying all items present. Outpt Resource Guide given.

## 2017-01-27 NOTE — ED Notes (Signed)
Delay in lab draw,  Pt talking to tts at this time.

## 2017-01-27 NOTE — ED Notes (Signed)
Telepsych performed Rhonda Purpura, NP, [facility] - advised will contact pt's sister then make decision if pt may be d/c'd.

## 2017-01-27 NOTE — ED Notes (Addendum)
States was experiencing a panic attack yesterday and now feels better - denies SI/HI and wants to go home. Advised pt [facility] Counselor will be reassessing her. Voiced agreement w/tx plan.

## 2017-01-27 NOTE — ED Notes (Signed)
Pt aware her sister - Eduard Clos - and her mother called - requested for her to return call. Pt voiced understanding may have 2 phone calls/day and voiced understanding of visitation time. Medical Clearance Pt Policy form discussed w/pt - copy placed at bedside.

## 2017-01-27 NOTE — ED Notes (Signed)
Snack given to pt.
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
## 2017-01-27 NOTE — ED Notes (Signed)
Lunch order placed, and snack given.

## 2017-01-27 NOTE — ED Provider Notes (Signed)
I discussed the case with TTS, who evaluated the patient by telemetry. They feel that she is not suicidal and is stable for discharge home.  At this time, the patient is alert, calm, cooperative. She admits that she is not suicidal, and that she is stressed by her home environment, with her 6 children. She states that her sister helps her with her children and will help her get some help with her stress, when she goes home.  She will be given resources by the nurse, for follow-up care for mental health purposes.   Daleen Bo, MD 01/27/17 1524

## 2017-01-27 NOTE — ED Notes (Signed)
Aware waiting for decision from [facility].

## 2017-01-27 NOTE — ED Notes (Signed)
Pt on phone at nurses' desk. 

## 2017-01-28 LAB — URINE CULTURE

## 2017-02-26 ENCOUNTER — Ambulatory Visit: Payer: Self-pay | Admitting: Family Medicine

## 2017-03-08 ENCOUNTER — Ambulatory Visit (INDEPENDENT_AMBULATORY_CARE_PROVIDER_SITE_OTHER): Payer: Medicare Other | Admitting: Family Medicine

## 2017-03-08 ENCOUNTER — Other Ambulatory Visit (HOSPITAL_COMMUNITY)
Admission: RE | Admit: 2017-03-08 | Discharge: 2017-03-08 | Disposition: A | Discharge status: Home / Self Care | Payer: Medicare Other | Source: Ambulatory Visit | Attending: Family Medicine | Admitting: Family Medicine

## 2017-03-08 ENCOUNTER — Encounter: Payer: Self-pay | Admitting: Family Medicine

## 2017-03-08 VITALS — BP 110/72 | HR 84 | Temp 98.2°F | Ht 67.0 in | Wt 161.6 lb

## 2017-03-08 DIAGNOSIS — N870 Mild cervical dysplasia: Secondary | ICD-10-CM | POA: Diagnosis not present

## 2017-03-08 DIAGNOSIS — Z124 Encounter for screening for malignant neoplasm of cervix: Secondary | ICD-10-CM | POA: Insufficient documentation

## 2017-03-08 DIAGNOSIS — N898 Other specified noninflammatory disorders of vagina: Secondary | ICD-10-CM | POA: Diagnosis not present

## 2017-03-08 DIAGNOSIS — Z114 Encounter for screening for human immunodeficiency virus [HIV]: Secondary | ICD-10-CM

## 2017-03-08 DIAGNOSIS — N739 Female pelvic inflammatory disease, unspecified: Secondary | ICD-10-CM

## 2017-03-08 DIAGNOSIS — Z7251 High risk heterosexual behavior: Secondary | ICD-10-CM | POA: Diagnosis not present

## 2017-03-08 DIAGNOSIS — R6889 Other general symptoms and signs: Secondary | ICD-10-CM | POA: Diagnosis not present

## 2017-03-08 LAB — POCT WET PREP (WET MOUNT)
Clue Cells Wet Prep Whiff POC: NEGATIVE
Trichomonas Wet Prep HPF POC: ABSENT

## 2017-03-08 MED ORDER — DOXYCYCLINE HYCLATE 100 MG PO TABS: 100.0000 mg | ORAL_TABLET | Freq: Two times a day (BID) | ORAL | 0 refills | Status: DC

## 2017-03-08 MED ORDER — FLUCONAZOLE 150 MG PO TABS: 150.0000 mg | ORAL_TABLET | Freq: Once | ORAL | 0 refills | Status: AC

## 2017-03-08 MED ORDER — CEFTRIAXONE SODIUM 250 MG IJ SOLR
250.0000 mg | Freq: Once | INTRAMUSCULAR | Status: AC
  Administered 2017-03-08: 250 mg via INTRAMUSCULAR

## 2017-03-08 NOTE — Patient Instructions (Addendum)
Thank you so much for coming to visit today! We did several tests today. We will let you know the results. If your pap smear is abnormal, you will need to return for Colposcopy. For your pelvic infection, we will give a shot of Ceftriaxone and a 2 week course of Doxycycline twice a day.   Dr. Gerlean Ren   Pelvic Inflammatory Disease Pelvic inflammatory disease (PID) refers to an infection in some or all of the female organs. The infection can be in the uterus, ovaries, fallopian tubes, or the surrounding tissues in the pelvis. PID can cause abdominal or pelvic pain that comes on suddenly (acute pelvic pain). PID is a serious infection because it can lead to lasting (chronic) pelvic pain or the inability to have children (infertility). What are the causes? This condition is most often caused by an infection that is spread during sexual contact. However, the infection can also be caused by the normal bacteria that are found in the vaginal tissues if these bacteria travel upward into the reproductive organs. PID can also occur following:  The birth of a baby.  A miscarriage.  An abortion.  Major pelvic surgery.  The use of an intrauterine device (IUD).  A sexual assault. What increases the risk? This condition is more likely to develop in women who:  Are younger than 31 years of age.  Are sexually active at [facility] age.  Use nonbarrier contraception.  Have multiple sexual partners.  Have sex with someone who has symptoms of an STD (sexually transmitted disease).  Use oral contraception. At times, certain behaviors can also increase the possibility of getting PID, such as:  Using a vaginal douche.  Having an IUD in place. What are the signs or symptoms? Symptoms of this condition include:  Abdominal or pelvic pain.  Fever.  Chills.  Abnormal vaginal discharge.  Abnormal uterine bleeding.  Unusual pain shortly after the end of a menstrual period.  Painful  urination.  Pain with sexual intercourse.  Nausea and vomiting. How is this diagnosed? To diagnose this condition, your health care provider will do a physical exam and take your medical history. A pelvic exam typically reveals great tenderness in the uterus and the surrounding pelvic tissues. You may also have tests, such as:  Lab tests, including a pregnancy test, blood tests, and urine test.  Culture tests of the vagina and cervix to check for an STD.  Ultrasound.  A laparoscopic procedure to look inside the pelvis.  Examining vaginal secretions under a microscope. How is this treated? Treatment for this condition may involve one or more approaches.  Antibiotic medicines may be prescribed to be taken by mouth.  Sexual partners may need to be treated if the infection is caused by an STD.  For more severe cases, hospitalization may be needed to give antibiotics directly into a vein through an IV tube.  Surgery may be needed if other treatments do not help, but this is rare. It may take weeks until you are completely well. If you are diagnosed with PID, you should also be checked for human immunodeficiency virus (HIV). Your health care provider may test you for infection again 3 months after treatment. You should not have unprotected sex. Follow these instructions at home:  Take over-the-counter and prescription medicines only as told by your health care provider.  If you were prescribed an antibiotic medicine, take it as told by your health care provider. Do not stop taking the antibiotic even if you start to feel  better.  Do not have sexual intercourse until treatment is completed or as told by your health care provider. If PID is confirmed, your recent sexual partners will need treatment, especially if you had unprotected sex.  Keep all follow-up visits as told by your health care provider. This is important. Contact a health care provider if:  You have increased or abnormal  vaginal discharge.  Your pain does not improve.  You vomit.  You have a fever.  You cannot tolerate your medicines.  Your partner has an STD.  You have pain when you urinate. Get help right away if:  You have increased abdominal or pelvic pain.  You have chills.  Your symptoms are not better in 72 hours even with treatment. This information is not intended to replace advice given to you by your health care provider. Make sure you discuss any questions you have with your health care provider. Document Released: 12/04/2005 Document Revised: 05/11/2016 Document Reviewed: 01/11/2015 Elsevier Interactive Patient Education  2017 [employer].

## 2017-03-09 LAB — TSH: TSH: 0.292 u[IU]/mL — AB (ref 0.450–4.500)

## 2017-03-09 LAB — CERVICOVAGINAL ANCILLARY ONLY
Chlamydia: NEGATIVE
Neisseria Gonorrhea: NEGATIVE

## 2017-03-09 LAB — HIV ANTIBODY (ROUTINE TESTING W REFLEX): HIV Screen 4th Generation wRfx: NONREACTIVE

## 2017-03-09 LAB — RPR: RPR: NONREACTIVE

## 2017-03-11 LAB — CYTOLOGY - PAP: HPV: DETECTED — AB

## 2017-03-11 NOTE — Progress Notes (Signed)
Subjective:     Patient ID: Rhonda Cabrera, female   DOB: 14-May-1986, 31 y.o.   MRN: [id number]  HPI Rhonda Cabrera is a 31yo female presenting today for concern of STD. Reports lower pelvic/abdominal pain. Describes pain as "pinching" and achy. Pain is intermittent and present for the last several weeks. Feels like she is cold all of the time, but denies fever. Notes regular vaginal discharge. Denies vaginal bleeding. She is sexually active and does not use protection. Requests screening for STDs. Currently due for pap smear. Smoker.  Review of Systems Per HPI    Objective:   Physical Exam  Constitutional: She appears well-developed and well-nourished. No distress.  Cardiovascular: Normal rate and regular rhythm.   No murmur heard. Pulmonary/Chest: Effort normal. No respiratory distress. She has no wheezes.  Genitourinary:  Genitourinary Comments: No discharge noted. No genital ulcers or sores noted. Cervix appears normal. No right adnexal tenderness, but significant tenderness noted in left adnexa. No cervical motion tenderness. No vaginal wall tenderness noted.  Psychiatric: She has a normal mood and affect. Her behavior is normal.      Assessment and Plan:     1. Pelvic Inflammatory Disease Concern for PID with left adnexa tenderness. Will screen for GC/Chlamydia today. CTX 250mg  IM given in clinic and course of Doxycycline 100mg  twice daily for 14 days prescribed. Will also check wet prep, HIV, RPR. Pap Smear obtained since due. Will also check TSH given cold intolerance for several months.

## 2017-03-12 ENCOUNTER — Telehealth: Payer: Self-pay | Admitting: Family Medicine

## 2017-03-12 DIAGNOSIS — R7989 Other specified abnormal findings of blood chemistry: Secondary | ICD-10-CM

## 2017-03-12 NOTE — Telephone Encounter (Signed)
Contacted concerning lab results.  TSH low. Will order future lab visit to check T3 and T4.  Pap Smear abnormal with CIN-1/LSIL, HPV +. Strongly encouraged to return for Colposcopy. Was told at last pap smear in 2016 to return for colposcopy, but patient was too scared of what they would find. Patient agrees to call tomorrow to schedule appointment with colposcopy clinic. Will forward to Dr. Nori Riis so she is aware.

## 2017-04-16 ENCOUNTER — Emergency Department (HOSPITAL_COMMUNITY)
Admission: EM | Admit: 2017-04-16 | Discharge: 2017-04-16 | Disposition: A | Discharge status: Home / Self Care | Payer: Medicare Other | Attending: Emergency Medicine | Admitting: Emergency Medicine

## 2017-04-16 ENCOUNTER — Emergency Department (HOSPITAL_COMMUNITY): Payer: Medicare Other

## 2017-04-16 ENCOUNTER — Encounter (HOSPITAL_COMMUNITY): Payer: Self-pay

## 2017-04-16 DIAGNOSIS — F430 Acute stress reaction: Secondary | ICD-10-CM | POA: Diagnosis not present

## 2017-04-16 DIAGNOSIS — F1210 Cannabis abuse, uncomplicated: Secondary | ICD-10-CM | POA: Diagnosis not present

## 2017-04-16 DIAGNOSIS — E876 Hypokalemia: Secondary | ICD-10-CM

## 2017-04-16 DIAGNOSIS — Z79899 Other long term (current) drug therapy: Secondary | ICD-10-CM | POA: Insufficient documentation

## 2017-04-16 DIAGNOSIS — F17210 Nicotine dependence, cigarettes, uncomplicated: Secondary | ICD-10-CM | POA: Diagnosis not present

## 2017-04-16 DIAGNOSIS — J45909 Unspecified asthma, uncomplicated: Secondary | ICD-10-CM | POA: Diagnosis not present

## 2017-04-16 DIAGNOSIS — R451 Restlessness and agitation: Secondary | ICD-10-CM | POA: Diagnosis not present

## 2017-04-16 DIAGNOSIS — F1910 Other psychoactive substance abuse, uncomplicated: Secondary | ICD-10-CM

## 2017-04-16 DIAGNOSIS — R4182 Altered mental status, unspecified: Secondary | ICD-10-CM | POA: Diagnosis not present

## 2017-04-16 DIAGNOSIS — I10 Essential (primary) hypertension: Secondary | ICD-10-CM | POA: Diagnosis not present

## 2017-04-16 DIAGNOSIS — T50904A Poisoning by unspecified drugs, medicaments and biological substances, undetermined, initial encounter: Secondary | ICD-10-CM | POA: Diagnosis not present

## 2017-04-16 LAB — I-STAT CHEM 8, ED
BUN: 13 mg/dL (ref 6–20)
Calcium, Ion: 1.07 mmol/L — ABNORMAL LOW (ref 1.15–1.40)
Chloride: 104 mmol/L (ref 101–111)
Creatinine, Ser: 1.1 mg/dL — ABNORMAL HIGH (ref 0.44–1.00)
Glucose, Bld: 226 mg/dL — ABNORMAL HIGH (ref 65–99)
HEMATOCRIT: 36 % (ref 36.0–46.0)
Hemoglobin: 12.2 g/dL (ref 12.0–15.0)
Potassium: 2.8 mmol/L — ABNORMAL LOW (ref 3.5–5.1)
SODIUM: 140 mmol/L (ref 135–145)
TCO2: 21 mmol/L (ref 0–100)

## 2017-04-16 LAB — COMPREHENSIVE METABOLIC PANEL
ALBUMIN: 3.7 g/dL (ref 3.5–5.0)
ALK PHOS: 50 U/L (ref 38–126)
ALT: 12 U/L — AB (ref 14–54)
AST: 27 U/L (ref 15–41)
Anion gap: 11 (ref 5–15)
BILIRUBIN TOTAL: 0.5 mg/dL (ref 0.3–1.2)
BUN: 12 mg/dL (ref 6–20)
CALCIUM: 8.3 mg/dL — AB (ref 8.9–10.3)
CO2: 19 mmol/L — ABNORMAL LOW (ref 22–32)
CREATININE: 1.23 mg/dL — AB (ref 0.44–1.00)
Chloride: 108 mmol/L (ref 101–111)
GFR calc Af Amer: >60 mL/min (ref >60)
GFR, EST NON AFRICAN AMERICAN: 58 mL/min — AB (ref >60)
GLUCOSE: 225 mg/dL — AB (ref 65–99)
POTASSIUM: 2.8 mmol/L — AB (ref 3.5–5.1)
Sodium: 138 mmol/L (ref 135–145)
TOTAL PROTEIN: 6.1 g/dL — AB (ref 6.5–8.1)

## 2017-04-16 LAB — ETHANOL: Alcohol, Ethyl (B): <5 mg/dL (ref <5)

## 2017-04-16 LAB — CBC WITH DIFFERENTIAL/PLATELET
BASOS ABS: 0 10*3/uL (ref 0.0–0.1)
BASOS PCT: 0 %
EOS ABS: 0.1 10*3/uL (ref 0.0–0.7)
EOS PCT: 0 %
HCT: 35.3 % — ABNORMAL LOW (ref 36.0–46.0)
Hemoglobin: 11.7 g/dL — ABNORMAL LOW (ref 12.0–15.0)
Lymphocytes Relative: 10 %
Lymphs Abs: 1.6 10*3/uL (ref 0.7–4.0)
MCH: 29 pg (ref 26.0–34.0)
MCHC: 33.1 g/dL (ref 30.0–36.0)
MCV: 87.6 fL (ref 78.0–100.0)
MONO ABS: 1 10*3/uL (ref 0.1–1.0)
Monocytes Relative: 6 %
Neutro Abs: 13.7 10*3/uL — ABNORMAL HIGH (ref 1.7–7.7)
Neutrophils Relative %: 84 %
Platelets: 308 10*3/uL (ref 150–400)
RBC: 4.03 MIL/uL (ref 3.87–5.11)
RDW: 12.6 % (ref 11.5–15.5)
WBC: 16.4 10*3/uL — AB (ref 4.0–10.5)

## 2017-04-16 LAB — I-STAT BETA HCG BLOOD, ED (MC, WL, AP ONLY): I-stat hCG, quantitative: <5 m[IU]/mL (ref <5)

## 2017-04-16 LAB — CBG MONITORING, ED: Glucose-Capillary: 240 mg/dL — ABNORMAL HIGH (ref 65–99)

## 2017-04-16 LAB — LACTIC ACID, PLASMA: LACTIC ACID, VENOUS: 4.5 mmol/L — AB (ref 0.5–1.9)

## 2017-04-16 LAB — AMMONIA: Ammonia: 20 umol/L (ref 9–35)

## 2017-04-16 MED ORDER — SODIUM CHLORIDE 0.9 % IV BOLUS (SEPSIS)
1000.0000 mL | Freq: Once | INTRAVENOUS | Status: AC
  Administered 2017-04-16: 1000 mL via INTRAVENOUS

## 2017-04-16 MED ORDER — LORAZEPAM 2 MG/ML IJ SOLN: 1.0000 mg | INTRAMUSCULAR | Status: DC | PRN

## 2017-04-16 MED ORDER — SODIUM CHLORIDE 0.9 % IV SOLN: INTRAVENOUS | Status: DC

## 2017-04-16 MED ORDER — POTASSIUM CHLORIDE CRYS ER 20 MEQ PO TBCR
40.0000 meq | EXTENDED_RELEASE_TABLET | Freq: Once | ORAL | Status: AC
  Administered 2017-04-16: 40 meq via ORAL
  Filled 2017-04-16: qty 2

## 2017-04-16 MED ORDER — SODIUM CHLORIDE 0.9 % IV BOLUS (SEPSIS): 1000.0000 mL | Freq: Once | INTRAVENOUS | Status: DC

## 2017-04-16 NOTE — ED Notes (Signed)
Portable xray at bedside.

## 2017-04-16 NOTE — ED Notes (Signed)
ED Provider at bedside. 

## 2017-04-16 NOTE — ED Notes (Signed)
Pt left before discharge instructions could be reviewed.

## 2017-04-16 NOTE — Discharge Instructions (Signed)
Get plenty of rest and drink a lot of fluids  Try to eat foods that have a lot of potassium in them.  Talk to family friends are a counselor regarding your stress.  Avoid using marijuana, or other illegal substances.  Return here, if needed, for problems.

## 2017-04-16 NOTE — ED Notes (Signed)
Pt stating she wants to leave, DR Eulis Foster at bedside
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
## 2017-04-16 NOTE — ED Notes (Signed)
Pt more alert asking "what happened" "how did I get here" "where was I at?" Pt reoriented by RN. Pt remains cooperative. Seems a little anxious

## 2017-04-16 NOTE — ED Notes (Addendum)
Pt calm and cooperative. Hand cuffs removed by [employer]. Pt remains calm and cooperative. No soft restraints applied at this time. [employer] remains at bedside

## 2017-04-16 NOTE — ED Notes (Signed)
Pt remains calm and cooperative. RN explained to pt that she will remain here until she is medically cleared and we monitor her for a while. Pt verbalized understanding. This RN notified [employer] that they no longer need to remain at bedside due to pt being calm and cooperative.

## 2017-04-16 NOTE — ED Notes (Signed)
Fall risk wristband and socks applied to pt.

## 2017-04-16 NOTE — ED Notes (Signed)
Rn asked if pt smoked anything pt sts "cigarettes and some weed" Rn asked if pt smoked weed with anyone and pt sts "myself" When asked if she knew the person she bought the weed from the pt did not answer.

## 2017-04-16 NOTE — ED Triage Notes (Addendum)
Pt coming with [facility] and [facility], per friends the pt smoked marajuana and suddenly became erratic, running around screaming and not "acting herself" pt hallucinating. Upon arrival pt is handcuffed to the stretcher for safety, not in [employer] custody. Pt screaming upon moving from stretcher to bed. Pt tachycardic 150s, BP ranging from 150/90 to 90/40. Pt given 5mg  midazolam Im by EMS and 1241ml fluids Pt alert and now calm. NAD

## 2017-04-26 ENCOUNTER — Encounter: Payer: Self-pay | Admitting: Family Medicine

## 2017-04-26 ENCOUNTER — Ambulatory Visit (INDEPENDENT_AMBULATORY_CARE_PROVIDER_SITE_OTHER): Payer: Medicare Other | Admitting: Family Medicine

## 2017-04-26 VITALS — BP 122/70 | HR 68 | Temp 98.8°F | Ht 67.0 in | Wt 152.2 lb

## 2017-04-26 DIAGNOSIS — R946 Abnormal results of thyroid function studies: Secondary | ICD-10-CM | POA: Diagnosis not present

## 2017-04-26 DIAGNOSIS — R7989 Other specified abnormal findings of blood chemistry: Secondary | ICD-10-CM

## 2017-04-26 DIAGNOSIS — R87612 Low grade squamous intraepithelial lesion on cytologic smear of cervix (LGSIL): Secondary | ICD-10-CM | POA: Diagnosis not present

## 2017-04-26 MED ORDER — IBUPROFEN 200 MG PO TABS
400.0000 mg | ORAL_TABLET | Freq: Once | ORAL | Status: AC
  Administered 2017-04-26: 400 mg via ORAL

## 2017-04-26 NOTE — Addendum Note (Signed)
Addended by: Derl Barrow on: 04/26/2017 12:23 PM   Modules accepted: Orders

## 2017-04-26 NOTE — Patient Instructions (Signed)
Colposcopy, Care After This sheet gives you information about how to care for yourself after your procedure. Your doctor may also give you more specific instructions. If you have problems or questions, contact your doctor. What can I expect after the procedure? If you did not have a tissue sample removed (did not have a biopsy), you may only have some spotting for a few days. You can go back to your normal activities. If you had a tissue sample removed, it is common to have:  Soreness and pain. This may last for a few days.  Light-headedness.  Mild bleeding from your vagina or dark-colored, grainy discharge from your vagina. This may last for a few days. You may need to wear a sanitary pad.  Spotting for at least 48 hours after the procedure. Follow these instructions at home:  Take over-the-counter and prescription medicines only as told by your doctor. Ask your doctor what medicines you can start taking again. This is very important if you take blood-thinning medicine.  Do not drive or use heavy machinery while taking prescription pain medicine.  For 3 days, or as long as your doctor tells you, avoid:  Douching.  Using tampons.  Having sex.  If you use birth control (contraception), keep using it.  Limit activity for the first day after the procedure. Ask your doctor what activities are safe for you.  It is up to you to get the results of your procedure. Ask your doctor when your results will be ready.  Keep all follow-up visits as told by your doctor. This is important. Contact a doctor if:  You get a skin rash. Get help right away if:  You are bleeding a lot from your vagina. It is a lot of bleeding if you are using more than one pad an hour for 2 hours in a row.  You have clumps of blood (blood clots) coming from your vagina.  You have a fever.  You have chills  You have pain in your lower belly (pelvic area).  You have signs of infection, such as vaginal  discharge that is:  Different than usual.  Yellow.  Bad-smelling.  You have very pain or cramps in your lower belly that do not get better with medicine.  You feel light-headed.  You feel dizzy.  You pass out (faint). Summary  If you did not have a tissue sample removed (did not have a biopsy), you may only have some spotting for a few days. You can go back to your normal activities.  If you had a tissue sample removed, it is common to have mild pain and spotting for 48 hours.  For 3 days, or as long as your doctor tells you, avoid douching, using tampons and having sex.  Get help right away if you have bleeding, very bad pain, or signs of infection. This information is not intended to replace advice given to you by your health care provider. Make sure you discuss any questions you have with your health care provider. Document Released: 05/22/2008 Document Revised: 08/23/2016 Document Reviewed: 08/23/2016 Elsevier Interactive Patient Education  2017 [employer]  

## 2017-04-26 NOTE — Progress Notes (Signed)
Patient ID: Rhonda Cabrera, female   DOB: 07-Oct-1986, 31 y.o.   MRN: [id number]  Chief Complaint  Patient presents with  . Colposcopy    PAP LSIL +HPV    HPI Rhonda Cabrera is a 31 y.o. female here for colposcopy. Denies vaginal bleeding or pelvic pain. No questions or concerns. Last colpo was at age 41. She is getting labs done for thyroid today per her PCP, reports fatigue and weight loss.  HPI  Indications: Pap smear on May 2018 showed: low-grade squamous intraepithelial neoplasia (LGSIL - encompassing HPV,mild dysplasia,CIN I). Previous colposcopy: at age 20 with biopsy. Prior cervical treatment: no treatment.  Past Medical History:  Diagnosis Date  . Abnormal Pap smear   . Anxiety   . Asthma   . Bipolar 1 disorder ([place])   . Chlamydia 07/12/2012  . Depression   . Headache   . Hypertension   . Opiate addiction ([place])   . Polysubstance abuse     Past Surgical History:  Procedure Laterality Date  . CHOLECYSTECTOMY  11/04/2012   Procedure: LAPAROSCOPIC CHOLECYSTECTOMY WITH INTRAOPERATIVE CHOLANGIOGRAM;  Surgeon: Imogene Burn. Georgette Dover, MD;  Location: [facility];  Service: General;  Laterality: N/A;  . TUBAL LIGATION  09/08/2011   Procedure: POST PARTUM TUBAL LIGATION;  Surgeon: Emeterio Reeve, MD;  Location: [facility];  Service: Gynecology;  Laterality: Bilateral;  . vaginal deliveries      Family History  Problem Relation Age of Onset  . Drug abuse Mother   . Drug abuse Father   . Diabetes Maternal Grandmother   . Hypertension Maternal Grandmother   . Diabetes Paternal Grandmother   . Diabetes Paternal Grandfather     Social History Social History  Substance Use Topics  . Smoking status: Current Every Day Smoker    Packs/day: 1.00    Types: Cigarettes  . Smokeless tobacco: Never Used  . Alcohol use No    No Known Allergies  Current Outpatient Prescriptions  Medication Sig Dispense Refill  . beclomethasone (QVAR) 80 MCG/ACT inhaler Inhale 1 puff into the lungs 2  (two) times daily. 1 Inhaler 5  . Biotin 10000 MCG TABS Take 1 tablet by mouth every morning.    Marland Kitchen doxycycline (VIBRA-TABS) 100 MG tablet Take 1 tablet (100 mg total) by mouth 2 (two) times daily. 28 tablet 0  . gabapentin (NEURONTIN) 600 MG tablet Take 600 mg by mouth 3 (three) times daily.    . Multiple Vitamin (MULTIVITAMIN WITH MINERALS) TABS tablet Take 1 tablet by mouth daily.    . QUEtiapine (SEROQUEL) 300 MG tablet Take 200 mg by mouth at bedtime.      No current facility-administered medications for this visit.     Review of Systems Review of Systems- see HPI  Blood pressure 122/70, pulse 68, temperature 98.8 F (37.1 C), temperature source Oral, height 5\' 7"  (1.702 m), weight 152 lb 3.2 oz (69 kg), SpO2 97 %.  Physical Exam Physical Exam  Constitutional: She is oriented to person, place, and time. She appears well-developed and well-nourished. No distress.  HENT:  Head: Normocephalic and atraumatic.  Genitourinary: Vagina normal.  Neurological: She is alert and oriented to person, place, and time. She exhibits normal muscle tone.  Skin: Skin is warm and dry.  Psychiatric: She has a normal mood and affect.   Data Reviewed Pap cytology reports from 2018, 2016, 2012  Assessment    Procedure Details  The risks and benefits of the procedure and Written informed consent obtained.  Speculum  placed in vagina and excellent visualization of cervix achieved, cervix swabbed x 3 with acetic acid solution.  Iodine applied. Cervical color change visualized at 11 o'clock. Area biopsied, ECC collected. Monsel's solution applied with good hemostasis.    Specimens: cervical biopsy from 11 o'clock position, ECC scrapings  Complications: none.     Plan    Specimens labelled and sent to Pathology. Return to discuss Pathology results in 2 weeks. Follow up with PCP to discuss thyroid labs      Steve Rattler 04/26/2017, 11:04 AM

## 2017-04-26 NOTE — Addendum Note (Signed)
Addended by: Andrena Mews T on: 04/26/2017 11:56 AM   Modules accepted: Orders

## 2017-04-27 LAB — T4, FREE: Free T4: 1.55 ng/dL (ref 0.82–1.77)

## 2017-04-27 LAB — T3, FREE: T3, Free: 3 pg/mL (ref 2.0–4.4)

## 2017-04-30 ENCOUNTER — Telehealth: Payer: Self-pay | Admitting: Family Medicine

## 2017-04-30 NOTE — Telephone Encounter (Signed)
Normal Colpo result discussed with patient.  Colpo result is normal despite + Aceto-white lesion at 11 O'clock. Hence, I recommended repeat colpo in 6 months. Patient agreed with plan and verbalized understanding. She will call to schedule appointment in 6 months.  She asked that her PCP call her with her thyroid function test result and to discuss management plan. I will forward this message to Dr. Gerlean Ren to follow up with patient.

## 2017-05-13 ENCOUNTER — Encounter (HOSPITAL_COMMUNITY): Payer: Self-pay | Admitting: *Deleted

## 2017-05-13 ENCOUNTER — Ambulatory Visit (HOSPITAL_COMMUNITY)
Admission: EM | Admit: 2017-05-13 | Discharge: 2017-05-13 | Disposition: A | Discharge status: Home / Self Care | Payer: Medicare Other | Attending: Family Medicine | Admitting: Family Medicine

## 2017-05-13 DIAGNOSIS — Z3202 Encounter for pregnancy test, result negative: Secondary | ICD-10-CM | POA: Diagnosis not present

## 2017-05-13 DIAGNOSIS — N3090 Cystitis, unspecified without hematuria: Secondary | ICD-10-CM

## 2017-05-13 LAB — POCT PREGNANCY, URINE: Preg Test, Ur: NEGATIVE

## 2017-05-13 LAB — POCT URINALYSIS DIP (DEVICE)
Glucose, UA: 250 mg/dL — AB
Hgb urine dipstick: NEGATIVE
KETONES UR: 15 mg/dL — AB
Nitrite: POSITIVE — AB
Urobilinogen, UA: >=8 mg/dL (ref 0.0–1.0)
pH: 8.5 — ABNORMAL HIGH (ref 5.0–8.0)

## 2017-05-13 MED ORDER — SULFAMETHOXAZOLE-TRIMETHOPRIM 800-160 MG PO TABS: 1.0000 | ORAL_TABLET | Freq: Two times a day (BID) | ORAL | 0 refills | Status: AC

## 2017-05-13 MED ORDER — FLUCONAZOLE 150 MG PO TABS: 150.0000 mg | ORAL_TABLET | Freq: Once | ORAL | 0 refills | Status: AC
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
## 2017-05-13 NOTE — ED Notes (Signed)
Clean & Dirty urine specimens obtain and are in lab

## 2017-05-13 NOTE — ED Triage Notes (Signed)
Pt  Reports   Frequently    Dysuria       Foul  Odor   When  She  Urinates       Pt  Reports  Has  Had  uti  In  Past  And   Symptoms  Feel  Similar

## 2017-05-21 ENCOUNTER — Ambulatory Visit (INDEPENDENT_AMBULATORY_CARE_PROVIDER_SITE_OTHER): Payer: Medicare Other | Admitting: Family Medicine

## 2017-05-21 DIAGNOSIS — Z5329 Procedure and treatment not carried out because of patient's decision for other reasons: Secondary | ICD-10-CM

## 2017-05-22 ENCOUNTER — Encounter: Payer: Self-pay | Admitting: Family Medicine

## 2017-05-22 NOTE — Progress Notes (Signed)
Opened in error

## 2017-05-25 ENCOUNTER — Emergency Department (HOSPITAL_COMMUNITY)
Admission: EM | Admit: 2017-05-25 | Discharge: 2017-05-25 | Disposition: A | Discharge status: Home / Self Care | Payer: Medicare Other | Attending: Emergency Medicine | Admitting: Emergency Medicine

## 2017-05-25 ENCOUNTER — Encounter (HOSPITAL_COMMUNITY): Payer: Self-pay

## 2017-05-25 DIAGNOSIS — Z79899 Other long term (current) drug therapy: Secondary | ICD-10-CM | POA: Diagnosis not present

## 2017-05-25 DIAGNOSIS — J45909 Unspecified asthma, uncomplicated: Secondary | ICD-10-CM | POA: Diagnosis not present

## 2017-05-25 DIAGNOSIS — F17210 Nicotine dependence, cigarettes, uncomplicated: Secondary | ICD-10-CM | POA: Diagnosis not present

## 2017-05-25 DIAGNOSIS — K0889 Other specified disorders of teeth and supporting structures: Secondary | ICD-10-CM | POA: Insufficient documentation

## 2017-05-25 DIAGNOSIS — I10 Essential (primary) hypertension: Secondary | ICD-10-CM | POA: Diagnosis not present

## 2017-05-25 MED ORDER — NAPROXEN 500 MG PO TABS: 500.0000 mg | ORAL_TABLET | Freq: Two times a day (BID) | ORAL | 0 refills | Status: DC

## 2017-05-25 MED ORDER — ACETAMINOPHEN 325 MG PO TABS
650.0000 mg | ORAL_TABLET | Freq: Once | ORAL | Status: AC
  Administered 2017-05-25: 650 mg via ORAL
  Filled 2017-05-25: qty 2

## 2017-05-25 MED ORDER — PENICILLIN V POTASSIUM 500 MG PO TABS
500.0000 mg | ORAL_TABLET | Freq: Four times a day (QID) | ORAL | 0 refills | Status: DC
Start: 2017-05-25 — End: 2018-02-04

## 2017-05-25 NOTE — ED Triage Notes (Signed)
Patient c/o dental pain, left upper and lower since last night.. Patient also has facial swelling. Patient denies any swallowing or breathing difficulty.
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
## 2017-06-06 ENCOUNTER — Ambulatory Visit: Payer: Self-pay | Admitting: *Deleted

## 2017-07-25 ENCOUNTER — Encounter (HOSPITAL_COMMUNITY): Payer: Self-pay | Admitting: Family Medicine

## 2017-07-25 ENCOUNTER — Ambulatory Visit (HOSPITAL_COMMUNITY)
Admission: EM | Admit: 2017-07-25 | Discharge: 2017-07-25 | Disposition: A | Discharge status: Home / Self Care | Payer: Medicare Other | Attending: Family Medicine | Admitting: Family Medicine

## 2017-07-25 DIAGNOSIS — Z87440 Personal history of urinary (tract) infections: Secondary | ICD-10-CM | POA: Insufficient documentation

## 2017-07-25 DIAGNOSIS — N760 Acute vaginitis: Secondary | ICD-10-CM | POA: Diagnosis not present

## 2017-07-25 DIAGNOSIS — F332 Major depressive disorder, recurrent severe without psychotic features: Secondary | ICD-10-CM | POA: Diagnosis not present

## 2017-07-25 DIAGNOSIS — R8281 Pyuria: Secondary | ICD-10-CM

## 2017-07-25 DIAGNOSIS — Z79899 Other long term (current) drug therapy: Secondary | ICD-10-CM | POA: Insufficient documentation

## 2017-07-25 DIAGNOSIS — R350 Frequency of micturition: Secondary | ICD-10-CM | POA: Diagnosis present

## 2017-07-25 DIAGNOSIS — B373 Candidiasis of vulva and vagina: Secondary | ICD-10-CM | POA: Diagnosis not present

## 2017-07-25 DIAGNOSIS — N390 Urinary tract infection, site not specified: Secondary | ICD-10-CM | POA: Insufficient documentation

## 2017-07-25 DIAGNOSIS — F17210 Nicotine dependence, cigarettes, uncomplicated: Secondary | ICD-10-CM | POA: Insufficient documentation

## 2017-07-25 DIAGNOSIS — R300 Dysuria: Secondary | ICD-10-CM | POA: Diagnosis present

## 2017-07-25 LAB — POCT URINALYSIS DIP (DEVICE)
Bilirubin Urine: NEGATIVE
Glucose, UA: NEGATIVE mg/dL
HGB URINE DIPSTICK: NEGATIVE
Ketones, ur: NEGATIVE mg/dL
Leukocytes, UA: NEGATIVE
NITRITE: POSITIVE — AB
PH: 6 (ref 5.0–8.0)
PROTEIN: NEGATIVE mg/dL
Specific Gravity, Urine: >=1.03 (ref 1.005–1.030)
UROBILINOGEN UA: 0.2 mg/dL (ref 0.0–1.0)

## 2017-07-25 MED ORDER — SULFAMETHOXAZOLE-TRIMETHOPRIM 800-160 MG PO TABS: 1.0000 | ORAL_TABLET | Freq: Two times a day (BID) | ORAL | 0 refills | Status: DC

## 2017-07-25 MED ORDER — SULFAMETHOXAZOLE-TRIMETHOPRIM 800-160 MG PO TABS: 1.0000 | ORAL_TABLET | Freq: Two times a day (BID) | ORAL | 0 refills | Status: AC

## 2017-07-25 MED ORDER — METRONIDAZOLE 500 MG PO TABS: 500.0000 mg | ORAL_TABLET | Freq: Two times a day (BID) | ORAL | 0 refills | Status: DC
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
## 2017-07-25 NOTE — ED Notes (Signed)
See downtime documentation 

## 2017-07-25 NOTE — ED Notes (Signed)
See paper chart for triage.

## 2017-07-26 LAB — URINE CYTOLOGY ANCILLARY ONLY
Chlamydia: NEGATIVE
Neisseria Gonorrhea: NEGATIVE
Trichomonas: NEGATIVE

## 2017-07-30 LAB — URINE CYTOLOGY ANCILLARY ONLY
Bacterial vaginitis: POSITIVE — AB
Candida vaginitis: NEGATIVE

## 2017-08-21 ENCOUNTER — Ambulatory Visit: Payer: Self-pay | Admitting: Family Medicine

## 2017-10-19 ENCOUNTER — Encounter: Payer: Self-pay | Admitting: *Deleted

## 2017-11-15 ENCOUNTER — Ambulatory Visit: Payer: Self-pay

## 2017-11-22 ENCOUNTER — Ambulatory Visit: Payer: Self-pay

## 2017-11-29 ENCOUNTER — Other Ambulatory Visit (HOSPITAL_COMMUNITY)
Admission: RE | Admit: 2017-11-29 | Discharge: 2017-11-29 | Disposition: A | Discharge status: Home / Self Care | Payer: Medicare Other | Source: Ambulatory Visit | Attending: Family Medicine | Admitting: Family Medicine

## 2017-11-29 ENCOUNTER — Telehealth: Payer: Self-pay | Admitting: Family Medicine

## 2017-11-29 ENCOUNTER — Other Ambulatory Visit: Payer: Self-pay

## 2017-11-29 ENCOUNTER — Ambulatory Visit (INDEPENDENT_AMBULATORY_CARE_PROVIDER_SITE_OTHER): Payer: Medicare Other | Admitting: Family Medicine

## 2017-11-29 VITALS — BP 128/70 | HR 89 | Temp 98.2°F | Wt 157.0 lb

## 2017-11-29 DIAGNOSIS — N898 Other specified noninflammatory disorders of vagina: Secondary | ICD-10-CM | POA: Diagnosis not present

## 2017-11-29 DIAGNOSIS — R87612 Low grade squamous intraepithelial lesion on cytologic smear of cervix (LGSIL): Secondary | ICD-10-CM

## 2017-11-29 DIAGNOSIS — A630 Anogenital (venereal) warts: Secondary | ICD-10-CM | POA: Diagnosis not present

## 2017-11-29 DIAGNOSIS — N871 Moderate cervical dysplasia: Secondary | ICD-10-CM | POA: Diagnosis not present

## 2017-11-29 LAB — POCT WET PREP (WET MOUNT)
Clue Cells Wet Prep Whiff POC: NEGATIVE
Trichomonas Wet Prep HPF POC: ABSENT

## 2017-11-29 LAB — POCT URINE PREGNANCY: PREG TEST UR: NEGATIVE

## 2017-11-29 MED ORDER — IBUPROFEN 200 MG PO TABS
600.0000 mg | ORAL_TABLET | Freq: Once | ORAL | Status: AC
  Administered 2017-11-29: 600 mg via ORAL

## 2017-11-29 MED ORDER — METRONIDAZOLE 500 MG PO TABS: 500.0000 mg | ORAL_TABLET | Freq: Two times a day (BID) | ORAL | 0 refills | Status: DC

## 2017-11-29 MED ORDER — FLUCONAZOLE 150 MG PO TABS: 150.0000 mg | ORAL_TABLET | Freq: Once | ORAL | 0 refills | Status: AC

## 2017-11-29 NOTE — Patient Instructions (Signed)
Colposcopy, Care After  This sheet gives you information about how to care for yourself after your procedure. Your doctor may also give you more specific instructions. If you have problems or questions, contact your doctor.  What can I expect after the procedure?  If you did not have a tissue sample removed (did not have a biopsy), you may only have some spotting for a few days. You can go back to your normal activities.  If you had a tissue sample removed, it is common to have:  · Soreness and pain. This may last for a few days.  · Light-headedness.  · Mild bleeding from your vagina or dark-colored, grainy discharge from your vagina. This may last for a few days. You may need to wear a sanitary pad.  · Spotting for at least 48 hours after the procedure.    Follow these instructions at home:  · Take over-the-counter and prescription medicines only as told by your doctor. Ask your doctor what medicines you can start taking again. This is very important if you take blood-thinning medicine.  · Do not drive or use heavy machinery while taking prescription pain medicine.  · For 3 days, or as long as your doctor tells you, avoid:  ? Douching.  ? Using tampons.  ? Having sex.  · If you use birth control (contraception), keep using it.  · Limit activity for the first day after the procedure. Ask your doctor what activities are safe for you.  · It is up to you to get the results of your procedure. Ask your doctor when your results will be ready.  · Keep all follow-up visits as told by your doctor. This is important.  Contact a doctor if:  · You get a skin rash.  Get help right away if:  · You are bleeding a lot from your vagina. It is a lot of bleeding if you are using more than one pad an hour for 2 hours in a row.  · You have clumps of blood (blood clots) coming from your vagina.  · You have a fever.  · You have chills  · You have pain in your lower belly (pelvic area).  · You have signs of infection, such as vaginal  discharge that is:  ? Different than usual.  ? Yellow.  ? Bad-smelling.  · You have very pain or cramps in your lower belly that do not get better with medicine.  · You feel light-headed.  · You feel dizzy.  · You pass out (faint).  Summary  · If you did not have a tissue sample removed (did not have a biopsy), you may only have some spotting for a few days. You can go back to your normal activities.  · If you had a tissue sample removed, it is common to have mild pain and spotting for 48 hours.  · For 3 days, or as long as your doctor tells you, avoid douching, using tampons and having sex.  · Get help right away if you have bleeding, very bad pain, or signs of infection.  This information is not intended to replace advice given to you by your health care provider. Make sure you discuss any questions you have with your health care provider.  Document Released: 05/22/2008 Document Revised: 08/23/2016 Document Reviewed: 08/23/2016  Elsevier Interactive Patient Education © 2018 Elsevier Inc.

## 2017-11-29 NOTE — Progress Notes (Signed)
Patient ID: Rhonda Cabrera, female   DOB: 1985/12/23, 31 y.o.   MRN: [id number]  Chief Complaint  Patient presents with  . Colposcopy    HPI Rhonda Cabrera is a 31 y.o. female here for colposcopy. Patient reports malodorous vaginal discharge for the past 4 weeks. Patient denies any vaginal bleeding. HPI  Indications: Pap smear on May 2018 showed: low-grade squamous intraepithelial neoplasia (LGSIL - encompassing HPV,mild dysplasia,CIN I). Previous colposcopy: refer to previous notes. Abnormal lesions was noted around 11 o' clock. Biopsy was taken and pathology report was normal.  Past Medical History:  Diagnosis Date  . Abnormal Pap smear   . Anxiety   . Asthma   . Bipolar 1 disorder ([place])   . Chlamydia 07/12/2012  . Depression   . Headache   . Hypertension   . Opiate addiction ([place])   . Polysubstance abuse [facility])     Past Surgical History:  Procedure Laterality Date  . CHOLECYSTECTOMY  11/04/2012   Procedure: LAPAROSCOPIC CHOLECYSTECTOMY WITH INTRAOPERATIVE CHOLANGIOGRAM;  Surgeon: Imogene Burn. Georgette Dover, MD;  Location: [facility];  Service: General;  Laterality: N/A;  . TUBAL LIGATION  09/08/2011   Procedure: POST PARTUM TUBAL LIGATION;  Surgeon: Emeterio Reeve, MD;  Location: [facility];  Service: Gynecology;  Laterality: Bilateral;  . vaginal deliveries      Family History  Problem Relation Age of Onset  . Drug abuse Mother   . Drug abuse Father   . Diabetes Maternal Grandmother   . Hypertension Maternal Grandmother   . Diabetes Paternal Grandmother   . Diabetes Paternal Grandfather     Social History Social History   Tobacco Use  . Smoking status: Current Every Day Smoker    Packs/day: 1.00    Types: Cigarettes  . Smokeless tobacco: Never Used  Substance Use Topics  . Alcohol use: No    Alcohol/week: 0.0 oz  . Drug use: No    Comment:  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    No Known Allergies  Current Outpatient Medications  Medication Sig Dispense Refill   . beclomethasone (QVAR) 80 MCG/ACT inhaler Inhale 1 puff into the lungs 2 (two) times daily. 1 Inhaler 5  . Biotin 10000 MCG TABS Take 1 tablet by mouth every morning.    . gabapentin (NEURONTIN) 600 MG tablet Take 600 mg by mouth 3 (three) times daily.    . metroNIDAZOLE (FLAGYL) 500 MG tablet Take 1 tablet (500 mg total) by mouth 2 (two) times daily. 14 tablet 0  . Multiple Vitamin (MULTIVITAMIN WITH MINERALS) TABS tablet Take 1 tablet by mouth daily.    . naproxen (NAPROSYN) 500 MG tablet Take 1 tablet (500 mg total) by mouth 2 (two) times daily with a meal. 30 tablet 0  . penicillin v potassium (VEETID) 500 MG tablet Take 1 tablet (500 mg total) by mouth 4 (four) times daily. 40 tablet 0  . QUEtiapine (SEROQUEL) 300 MG tablet Take 200 mg by mouth at bedtime.      No current facility-administered medications for this visit.     Review of Systems Review of Systems  Genitourinary: Positive for vaginal discharge.  All other systems reviewed and are negative.   Blood pressure 128/70, pulse 89, temperature 98.2 F (36.8 C), temperature source Oral, weight 157 lb (71.2 kg), last menstrual period 11/22/2017, SpO2 99 %.  Physical Exam Physical Exam  Constitutional: She is oriented to person, place, and time. She appears well-developed.  HENT:  Head: Normocephalic and atraumatic.  Eyes: Pupils are equal, round, and reactive to light.  Neck: Normal range of motion. Neck supple.  Cardiovascular: Normal rate, regular rhythm and normal heart sounds.  Pulmonary/Chest: Effort normal and breath sounds normal.  Abdominal: Soft. Bowel sounds are normal.  Genitourinary: Vaginal discharge found.  Genitourinary Comments: Mild bleeding from cervical os noted after GC/CH swab collection  Musculoskeletal: Normal range of motion.  Neurological: She is alert and oriented to person, place, and time.  Skin: Skin is warm and dry.  Psychiatric: She has a normal mood and affect. Her behavior is normal.     Data Reviewed Pathology report from 04/2017   Assessment   Wet prep collected as well as GC/CH after patient reported malodorous discharge.  Procedure Details  The risks and benefits of the procedure and Written informed consent obtained.  Speculum placed in vagina and excellent visualization of cervix achieved. Abnormal vascularization noted with green filter light. Cervix swabbed x 3 with acetic acid solution.  Iodine applied. Cervical color change visualized at 11 o'clock and between 5 and 6 o'clock. Area biopsied, ECC collected. Monsel's solution applied with good hemostasis.    Specimens: cervical biopsy around 11 o'clock position, and between 5 and 6 o' clock, EC scrapings.  Complications: none.     Plan    Specimens labelled and sent to Pathology. Return to discuss Pathology results in 2 weeks.      Shandon Matson 11/29/2017, 11:31 AM

## 2017-11-29 NOTE — Telephone Encounter (Signed)
HIPPA compliant call back message left.    Note: Wet prep positive for few BV. Since she is symptomatic, I will go ahead and treat with Flagyl. GC/Chlamydia are still pending.  She mentioned during this visit that after completion of any A/B she usually have yeast infection. I will escribe Diflucan with the Flagyl as well.

## 2017-11-30 LAB — CERVICOVAGINAL ANCILLARY ONLY
CHLAMYDIA, DNA PROBE: NEGATIVE
Neisseria Gonorrhea: NEGATIVE

## 2017-11-30 MED ORDER — FLUCONAZOLE 150 MG PO TABS: 150.0000 mg | ORAL_TABLET | Freq: Once | ORAL | 0 refills | Status: AC

## 2017-11-30 MED ORDER — METRONIDAZOLE 500 MG PO TABS: 500.0000 mg | ORAL_TABLET | Freq: Two times a day (BID) | ORAL | 0 refills | Status: AC

## 2017-11-30 NOTE — Telephone Encounter (Signed)
I called and discussed GC/Chlamydia result with her. I also informed her that her prescription for BV has been escribed but she stated she wants a different pharmacy.   I called the previous pharmacy and canceled the prescription. ( I spoke with Mitzi Hansen).  New prescription send in to [facility] on [address].

## 2017-11-30 NOTE — Addendum Note (Signed)
Addended by: Andrena Mews T on: 11/30/2017 02:11 PM   Modules accepted: Orders

## 2017-12-03 ENCOUNTER — Telehealth: Payer: Self-pay | Admitting: Family Medicine

## 2017-12-03 DIAGNOSIS — D069 Carcinoma in situ of cervix, unspecified: Secondary | ICD-10-CM

## 2017-12-03 NOTE — Telephone Encounter (Signed)
Will discuss Colpo result when she calls back.  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 Will refer to Gyn for Ablation.

## 2017-12-04 ENCOUNTER — Encounter: Payer: Self-pay | Admitting: Family Medicine

## 2017-12-04 NOTE — Telephone Encounter (Signed)
I called again with test results. No response.  I will mail her a result letter.

## 2017-12-07 NOTE — Telephone Encounter (Signed)
Patient left message on nurse line requesting test results. Returned call and discussed contents of letter that was sent by Dr. Gwendlyn Deutscher and referral to GYN. Patient requesting return call from Dr. Gwendlyn Deutscher to discuss further. Best number to reach her is [phone]. Patient apologized for missing 2 previous calls from Dr. Gwendlyn Deutscher. Hubbard Hartshorn, RN, BSN

## 2017-12-07 NOTE — Telephone Encounter (Signed)
I called and went over result with her. All questions were answered. I did mention to her that I already did referral to Gyn for treatment. I gave her the number to call [facility] to follow-up with her referral. She appreciate the call back.

## 2018-01-03 ENCOUNTER — Ambulatory Visit (INDEPENDENT_AMBULATORY_CARE_PROVIDER_SITE_OTHER): Payer: Medicare Other | Admitting: Obstetrics & Gynecology

## 2018-01-03 ENCOUNTER — Encounter (HOSPITAL_COMMUNITY): Payer: Self-pay

## 2018-01-03 ENCOUNTER — Other Ambulatory Visit: Payer: Self-pay | Admitting: Obstetrics & Gynecology

## 2018-01-03 VITALS — BP 117/78 | HR 94 | Wt 153.0 lb

## 2018-01-03 DIAGNOSIS — D069 Carcinoma in situ of cervix, unspecified: Secondary | ICD-10-CM

## 2018-01-03 DIAGNOSIS — R102 Pelvic and perineal pain: Secondary | ICD-10-CM

## 2018-01-03 NOTE — Progress Notes (Signed)
Pt states she has had unprotected intercourse in the past x 14 days.

## 2018-01-03 NOTE — Progress Notes (Signed)
   Subjective:    Patient ID: Rhonda Cabrera, female    DOB: [birth year], 32 y.o.   MRN: [id number]  HPI 32 yo married P6 here today because her fam med doc thought that she needs a cryo. 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 She had a colpo 5/18 with only koilocytotic atypia. The colpo was then repeated 12/18 and showed 1. 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 2. 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 - NO INVASION   She also report severely painful periods. This has been going on for several years, worsening. She has dyspareunia for the last 6+ months. The pelvic pain is all month long but MUCH worse with her periods.  Review of Systems She had a BTL. She is a Agricultural engineer.    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

## 2018-01-14 ENCOUNTER — Ambulatory Visit (HOSPITAL_COMMUNITY): Payer: Medicare Other | Attending: Obstetrics & Gynecology

## 2018-02-06 NOTE — Patient Instructions (Addendum)
Your procedure is scheduled on: Tuesday  February 19, 2018 at 12:00 pm  Enter through the Main Entrance of [facility] at: 10:30 am  Pick up the phone at the desk and dial [phone].  Call this number if you have problems the morning of surgery: [phone].  Remember: Do NOT eat food: after Midnight on Monday March 4 Do NOT drink clear liquids after: 6:00 am day of surgery Take these medicines the morning of surgery with a SIP OF WATER: None  STOP ALL VITAMINS AND SUPPLEMENTS 1 WEEK PRIOR TO SURGERY  DO NOT SMOKE DAY OF SURGERY  Do NOT wear jewelry (body piercing), metal hair clips/bobby pins, make-up, or nail polish. Do NOT wear lotions, powders, or perfumes.  You may wear deoderant. Do NOT shave for 48 hours prior to surgery. Do NOT bring valuables to the hospital. Contacts, dentures, or bridgework may not be worn into surgery.  Have a responsible adult drive you home and stay with you for 24 hours after your procedure

## 2018-02-08 ENCOUNTER — Encounter (HOSPITAL_COMMUNITY)
Admission: RE | Admit: 2018-02-08 | Discharge: 2018-02-08 | Disposition: A | Discharge status: Home / Self Care | Payer: Medicare Other | Source: Ambulatory Visit | Attending: Obstetrics & Gynecology | Admitting: Obstetrics & Gynecology

## 2018-02-08 ENCOUNTER — Other Ambulatory Visit: Payer: Self-pay

## 2018-02-08 ENCOUNTER — Encounter (HOSPITAL_COMMUNITY): Payer: Self-pay

## 2018-02-08 DIAGNOSIS — N809 Endometriosis, unspecified: Secondary | ICD-10-CM | POA: Diagnosis not present

## 2018-02-08 DIAGNOSIS — K660 Peritoneal adhesions (postprocedural) (postinfection): Secondary | ICD-10-CM | POA: Insufficient documentation

## 2018-02-08 DIAGNOSIS — Z01818 Encounter for other preprocedural examination: Secondary | ICD-10-CM | POA: Diagnosis not present

## 2018-02-08 HISTORY — DX: Pneumonia, unspecified organism: J18.9

## 2018-02-08 HISTORY — DX: Anemia, unspecified: D64.9

## 2018-02-08 HISTORY — DX: Other specified postprocedural states: Z98.890

## 2018-02-08 HISTORY — DX: Family history of other specified conditions: Z84.89

## 2018-02-08 HISTORY — DX: Other specified postprocedural states: R11.2

## 2018-02-08 LAB — CBC
HCT: 39.1 % (ref 36.0–46.0)
Hemoglobin: 12.9 g/dL (ref 12.0–15.0)
MCH: 28.5 pg (ref 26.0–34.0)
MCHC: 33 g/dL (ref 30.0–36.0)
MCV: 86.5 fL (ref 78.0–100.0)
Platelets: 304 10*3/uL (ref 150–400)
RBC: 4.52 MIL/uL (ref 3.87–5.11)
RDW: 13.4 % (ref 11.5–15.5)
WBC: 5 10*3/uL (ref 4.0–10.5)

## 2018-02-11 ENCOUNTER — Telehealth: Payer: Self-pay

## 2018-02-11 NOTE — Telephone Encounter (Signed)
Patient notified, she verbalized understanding and said she will make appointment with her PCP.

## 2018-02-11 NOTE — Telephone Encounter (Signed)
-----   Message from Shelly Coss, RN sent at 02/11/2018 11:43 AM EST -----   ----- Message ----- From: [name], MD Sent: 02/11/2018   8:13 AM To: [facility] Clinical Pool  She needs to seen by her fam med doc due to her elevated glucose and creatinine. Thnaks

## 2018-02-12 ENCOUNTER — Encounter: Payer: Self-pay | Admitting: Student in an Organized Health Care Education/Training Program

## 2018-02-12 ENCOUNTER — Ambulatory Visit (HOSPITAL_COMMUNITY)
Admission: RE | Admit: 2018-02-12 | Discharge: 2018-02-12 | Disposition: A | Discharge status: Home / Self Care | Payer: Medicare Other | Source: Ambulatory Visit | Attending: Family Medicine | Admitting: Family Medicine

## 2018-02-12 ENCOUNTER — Ambulatory Visit (INDEPENDENT_AMBULATORY_CARE_PROVIDER_SITE_OTHER): Payer: Medicare Other | Admitting: Student in an Organized Health Care Education/Training Program

## 2018-02-12 ENCOUNTER — Other Ambulatory Visit: Payer: Self-pay

## 2018-02-12 VITALS — BP 110/74 | Temp 98.6°F | Ht 67.0 in | Wt 155.6 lb

## 2018-02-12 DIAGNOSIS — Z01818 Encounter for other preprocedural examination: Secondary | ICD-10-CM | POA: Diagnosis present

## 2018-02-12 DIAGNOSIS — R739 Hyperglycemia, unspecified: Secondary | ICD-10-CM | POA: Diagnosis not present

## 2018-02-12 DIAGNOSIS — N390 Urinary tract infection, site not specified: Secondary | ICD-10-CM

## 2018-02-12 DIAGNOSIS — R9431 Abnormal electrocardiogram [ECG] [EKG]: Secondary | ICD-10-CM | POA: Diagnosis not present

## 2018-02-12 DIAGNOSIS — R350 Frequency of micturition: Secondary | ICD-10-CM

## 2018-02-12 LAB — POCT UA - MICROSCOPIC ONLY

## 2018-02-12 LAB — POCT URINALYSIS DIP (MANUAL ENTRY)
BILIRUBIN UA: NEGATIVE
GLUCOSE UA: NEGATIVE mg/dL
Ketones, POC UA: NEGATIVE mg/dL
Nitrite, UA: NEGATIVE
SPEC GRAV UA: 1.02 (ref 1.010–1.025)
UROBILINOGEN UA: 1 U/dL
pH, UA: 8 (ref 5.0–8.0)

## 2018-02-12 LAB — GLUCOSE, POCT (MANUAL RESULT ENTRY): POC GLUCOSE: 85 mg/dL (ref 70–99)

## 2018-02-12 LAB — POCT GLYCOSYLATED HEMOGLOBIN (HGB A1C): Hemoglobin A1C: 5.3

## 2018-02-12 MED ORDER — CEPHALEXIN 500 MG PO CAPS: 500.0000 mg | ORAL_CAPSULE | Freq: Two times a day (BID) | ORAL | 0 refills | Status: AC

## 2018-02-12 NOTE — Patient Instructions (Addendum)
It was a pleasure seeing you today in our clinic. Today we discussed your upcoming surgery. Here is the treatment plan we have discussed and agreed upon together:  You are considered low risk for surgery. You do not have diabetes.  I sent a script for your UTI to your pharmacy.  Our clinic's number is [phone]. Please call with questions or concerns about what we discussed today.  Be well, Dr. Burr Medico

## 2018-02-12 NOTE — Progress Notes (Signed)
   CC: risk start for surgery  HPI: Rhonda Cabrera is a 32 y.o. female presenting for risk-stratification for surgery, additionally urinary frequency   Risk stratification for surgery She was previously seen by OB/GYN and recommended to have a cold knife conization.  Patient reports that she is here for optimization and risk stratification for surgery.  She has had elevated glucoses high as 226 on previous BMP, and has a strong family history of diabetes.  Therefore she is here for diabetes testing today.  Upon further review, she had an abnormal EKG at a prevoius hospitalization which was not further followed up. She denies chest pain or dyspnea. She does have a history of asthma, last flare was >1year ago and she is currently on no inhalers. She reports that she has not required her rescue inhaler in months.  Urinary frequency She reports urinary frequency, no dysuria or urinary incontinence.  No fevers, no back pain.  No vaginal discharge or pruritus.  She does have a history of UTIs in the past.  Review of Symptoms:  See HPI for ROS.   CC, SH/smoking status, and VS noted.  Objective: BP 110/74   Temp 98.6 F (37 C) (Oral)   Ht 5\' 7"  (1.702 m)   Wt 155 lb 9.6 oz (70.6 kg)   LMP 02/10/2018 (Exact Date)   BMI 24.37 kg/m  GEN: NAD, alert, cooperative, and pleasant. RESPIRATORY: clear to auscultation bilaterally with no wheezes, rhonchi or rales, good effort CV: RRR, no m/r/g, no peripheral edema GI: soft, non-tender, non-distended, no hepatosplenomegaly SKIN: warm and dry, no rashes or lesions NEURO: II-XII grossly intact, normal gait, peripheral sensation intact PSYCH: AAOx3, appropriate affect  EKG: NSR, unchanged from previous. Reviewed with the attending.  Assessment and plan:  Pre-operative exam - A1c is normal at 5.3 - EKG unchanged - patient reports she has not needed rescue inhalers for asthma in 1 year - considered low risk for surgery  Urinary tract  infection without hematuria Trace leukocytes on UA, will send for culture. Patient has been having urinary frequency so will treat UTI empirically. - Keflex ordered   Orders Placed This Encounter  Procedures  . Urine Culture  . POCT urinalysis dipstick  . POCT UA - Microscopic Only  . HgB A1c  . Glucose (CBG)  . EKG 12-Lead    Meds ordered this encounter  Medications  . cephALEXin (KEFLEX) 500 MG capsule    Sig: Take 1 capsule (500 mg total) by mouth 2 (two) times daily for 7 days.    Dispense:  14 capsule    Refill:  0     Everrett Coombe, MD,MS,  PGY2 02/18/2018 9:31 PM

## 2018-02-12 NOTE — Progress Notes (Signed)
Error

## 2018-02-14 LAB — URINE CULTURE: ORGANISM ID, BACTERIA: NO GROWTH

## 2018-02-18 ENCOUNTER — Encounter: Payer: Self-pay | Admitting: Student in an Organized Health Care Education/Training Program

## 2018-02-18 DIAGNOSIS — N390 Urinary tract infection, site not specified: Secondary | ICD-10-CM | POA: Insufficient documentation

## 2018-02-18 DIAGNOSIS — Z01818 Encounter for other preprocedural examination: Secondary | ICD-10-CM | POA: Insufficient documentation

## 2018-02-18 NOTE — Assessment & Plan Note (Signed)
-   A1c is normal at 5.3 - EKG unchanged - patient reports she has not needed rescue inhalers for asthma in 1 year - considered low risk for surgery

## 2018-02-18 NOTE — Assessment & Plan Note (Addendum)
Trace leukocytes on UA, will send for culture. Patient has been having urinary frequency so will treat UTI empirically. - Keflex ordered

## 2018-02-19 ENCOUNTER — Encounter (HOSPITAL_COMMUNITY): Payer: Self-pay

## 2018-02-19 ENCOUNTER — Ambulatory Visit (HOSPITAL_COMMUNITY): Payer: Medicare Other | Admitting: Anesthesiology

## 2018-02-19 ENCOUNTER — Ambulatory Visit (HOSPITAL_COMMUNITY)
Admission: AD | Admit: 2018-02-19 | Discharge: 2018-02-19 | Disposition: A | Discharge status: Home / Self Care | Payer: Medicare Other | Source: Ambulatory Visit | Attending: Obstetrics & Gynecology | Admitting: Obstetrics & Gynecology

## 2018-02-19 ENCOUNTER — Other Ambulatory Visit: Payer: Self-pay

## 2018-02-19 ENCOUNTER — Encounter (HOSPITAL_COMMUNITY)
Admission: AD | Disposition: A | Discharge status: Home / Self Care | Payer: Self-pay | Source: Ambulatory Visit | Attending: Obstetrics & Gynecology

## 2018-02-19 DIAGNOSIS — F319 Bipolar disorder, unspecified: Secondary | ICD-10-CM | POA: Insufficient documentation

## 2018-02-19 DIAGNOSIS — Z79899 Other long term (current) drug therapy: Secondary | ICD-10-CM | POA: Diagnosis not present

## 2018-02-19 DIAGNOSIS — N803 Endometriosis of pelvic peritoneum: Secondary | ICD-10-CM | POA: Diagnosis not present

## 2018-02-19 DIAGNOSIS — N946 Dysmenorrhea, unspecified: Secondary | ICD-10-CM | POA: Diagnosis present

## 2018-02-19 DIAGNOSIS — R102 Pelvic and perineal pain: Secondary | ICD-10-CM | POA: Diagnosis not present

## 2018-02-19 DIAGNOSIS — G8929 Other chronic pain: Secondary | ICD-10-CM | POA: Insufficient documentation

## 2018-02-19 DIAGNOSIS — D069 Carcinoma in situ of cervix, unspecified: Secondary | ICD-10-CM | POA: Insufficient documentation

## 2018-02-19 DIAGNOSIS — J45909 Unspecified asthma, uncomplicated: Secondary | ICD-10-CM | POA: Insufficient documentation

## 2018-02-19 DIAGNOSIS — I10 Essential (primary) hypertension: Secondary | ICD-10-CM | POA: Insufficient documentation

## 2018-02-19 DIAGNOSIS — F419 Anxiety disorder, unspecified: Secondary | ICD-10-CM | POA: Diagnosis not present

## 2018-02-19 DIAGNOSIS — Z7951 Long term (current) use of inhaled steroids: Secondary | ICD-10-CM | POA: Diagnosis not present

## 2018-02-19 DIAGNOSIS — F17210 Nicotine dependence, cigarettes, uncomplicated: Secondary | ICD-10-CM | POA: Diagnosis not present

## 2018-02-19 DIAGNOSIS — N800 Endometriosis of uterus: Secondary | ICD-10-CM | POA: Diagnosis not present

## 2018-02-19 HISTORY — PX: CERVICAL CONIZATION W/BX: SHX1330

## 2018-02-19 HISTORY — PX: LAPAROSCOPY: SHX197

## 2018-02-19 LAB — PREGNANCY, URINE: PREG TEST UR: NEGATIVE

## 2018-02-19 SURGERY — CONE BIOPSY, CERVIX
Anesthesia: General | Site: Vagina

## 2018-02-19 MED ORDER — ACETAMINOPHEN 10 MG/ML IV SOLN
INTRAVENOUS | Status: DC | PRN
  Administered 2018-02-19: 1000 mg via INTRAVENOUS

## 2018-02-19 MED ORDER — IBUPROFEN 800 MG PO TABS: 800.0000 mg | ORAL_TABLET | Freq: Three times a day (TID) | ORAL | 1 refills | Status: DC | PRN

## 2018-02-19 MED ORDER — ONDANSETRON HCL 4 MG/2ML IJ SOLN
INTRAMUSCULAR | Status: AC
  Filled 2018-02-19: qty 2

## 2018-02-19 MED ORDER — BUPRENORPHINE HCL 2 MG SL SUBL
4.0000 mg | SUBLINGUAL_TABLET | SUBLINGUAL | Status: DC | PRN
  Administered 2018-02-19: 4 mg via SUBLINGUAL
  Filled 2018-02-19 (×2): qty 2

## 2018-02-19 MED ORDER — BUPRENORPHINE HCL 2 MG SL SUBL: 4.0000 mg | SUBLINGUAL_TABLET | Freq: Every day | SUBLINGUAL | Status: DC

## 2018-02-19 MED ORDER — LIDOCAINE HCL (CARDIAC) 20 MG/ML IV SOLN
INTRAVENOUS | Status: DC | PRN
  Administered 2018-02-19: 60 mg via INTRAVENOUS

## 2018-02-19 MED ORDER — FERRIC SUBSULFATE SOLN
Status: DC | PRN
  Administered 2018-02-19: 1 via TOPICAL

## 2018-02-19 MED ORDER — SCOPOLAMINE 1 MG/3DAYS TD PT72
MEDICATED_PATCH | TRANSDERMAL | Status: AC
  Administered 2018-02-19: 1.5 mg via TRANSDERMAL
  Filled 2018-02-19: qty 1

## 2018-02-19 MED ORDER — MIDAZOLAM HCL 2 MG/2ML IJ SOLN
INTRAMUSCULAR | Status: AC
  Filled 2018-02-19: qty 2

## 2018-02-19 MED ORDER — ACETAMINOPHEN 500 MG PO TABS: 500.0000 mg | ORAL_TABLET | Freq: Four times a day (QID) | ORAL | 0 refills | Status: DC | PRN

## 2018-02-19 MED ORDER — ACETAMINOPHEN 10 MG/ML IV SOLN
INTRAVENOUS | Status: AC
  Filled 2018-02-19: qty 100

## 2018-02-19 MED ORDER — SUGAMMADEX SODIUM 500 MG/5ML IV SOLN
INTRAVENOUS | Status: AC
  Filled 2018-02-19: qty 5

## 2018-02-19 MED ORDER — PROPOFOL 10 MG/ML IV BOLUS
INTRAVENOUS | Status: AC
  Filled 2018-02-19: qty 20

## 2018-02-19 MED ORDER — SUGAMMADEX SODIUM 200 MG/2ML IV SOLN
INTRAVENOUS | Status: DC | PRN
Start: 2018-02-19 — End: 2018-02-19
  Administered 2018-02-19: 120 mg via INTRAVENOUS

## 2018-02-19 MED ORDER — KETOROLAC TROMETHAMINE 30 MG/ML IJ SOLN
INTRAMUSCULAR | Status: DC | PRN
  Administered 2018-02-19: 30 mg via INTRAVENOUS

## 2018-02-19 MED ORDER — FENTANYL CITRATE (PF) 100 MCG/2ML IJ SOLN
INTRAMUSCULAR | Status: DC | PRN
  Administered 2018-02-19 (×2): 100 ug via INTRAVENOUS
  Administered 2018-02-19: 50 ug via INTRAVENOUS

## 2018-02-19 MED ORDER — MIDAZOLAM HCL 2 MG/2ML IJ SOLN
INTRAMUSCULAR | Status: DC | PRN
  Administered 2018-02-19: 2 mg via INTRAVENOUS

## 2018-02-19 MED ORDER — ONDANSETRON HCL 4 MG/2ML IJ SOLN
INTRAMUSCULAR | Status: DC | PRN
  Administered 2018-02-19: 4 mg via INTRAVENOUS

## 2018-02-19 MED ORDER — GLYCOPYRROLATE 0.2 MG/ML IJ SOLN
INTRAMUSCULAR | Status: AC
  Filled 2018-02-19: qty 1

## 2018-02-19 MED ORDER — KETAMINE HCL 10 MG/ML IJ SOLN
INTRAMUSCULAR | Status: AC
  Filled 2018-02-19: qty 1

## 2018-02-19 MED ORDER — IODINE STRONG (LUGOLS) 5 % PO SOLN
ORAL | Status: AC
  Filled 2018-02-19: qty 1

## 2018-02-19 MED ORDER — PROPOFOL 10 MG/ML IV BOLUS
INTRAVENOUS | Status: DC | PRN
  Administered 2018-02-19: 170 mg via INTRAVENOUS

## 2018-02-19 MED ORDER — GLYCOPYRROLATE 0.2 MG/ML IJ SOLN
INTRAMUSCULAR | Status: DC | PRN
  Administered 2018-02-19: 0.2 mg via INTRAVENOUS

## 2018-02-19 MED ORDER — DEXAMETHASONE SODIUM PHOSPHATE 10 MG/ML IJ SOLN
INTRAMUSCULAR | Status: DC | PRN
  Administered 2018-02-19: 4 mg via INTRAVENOUS

## 2018-02-19 MED ORDER — SCOPOLAMINE 1 MG/3DAYS TD PT72
1.0000 | MEDICATED_PATCH | Freq: Once | TRANSDERMAL | Status: DC
  Administered 2018-02-19: 1.5 mg via TRANSDERMAL

## 2018-02-19 MED ORDER — DEXAMETHASONE SODIUM PHOSPHATE 4 MG/ML IJ SOLN
INTRAMUSCULAR | Status: AC
  Filled 2018-02-19: qty 1

## 2018-02-19 MED ORDER — LACTATED RINGERS IV SOLN
INTRAVENOUS | Status: DC
  Administered 2018-02-19: 13:00:00 via INTRAVENOUS
  Administered 2018-02-19: 100 mL/h via INTRAVENOUS

## 2018-02-19 MED ORDER — BUPIVACAINE HCL (PF) 0.5 % IJ SOLN
INTRAMUSCULAR | Status: AC
  Filled 2018-02-19: qty 30

## 2018-02-19 MED ORDER — FERRIC SUBSULFATE 259 MG/GM EX SOLN
CUTANEOUS | Status: AC
  Filled 2018-02-19: qty 8

## 2018-02-19 MED ORDER — ROCURONIUM BROMIDE 100 MG/10ML IV SOLN
INTRAVENOUS | Status: DC | PRN
  Administered 2018-02-19: 50 mg via INTRAVENOUS

## 2018-02-19 MED ORDER — LIDOCAINE HCL (CARDIAC) 20 MG/ML IV SOLN
INTRAVENOUS | Status: AC
  Filled 2018-02-19: qty 5

## 2018-02-19 MED ORDER — KETOROLAC TROMETHAMINE 30 MG/ML IJ SOLN
INTRAMUSCULAR | Status: AC
  Filled 2018-02-19: qty 1

## 2018-02-19 MED ORDER — BUPIVACAINE HCL (PF) 0.5 % IJ SOLN
INTRAMUSCULAR | Status: DC | PRN
  Administered 2018-02-19: 11 mL

## 2018-02-19 MED ORDER — ROCURONIUM BROMIDE 100 MG/10ML IV SOLN
INTRAVENOUS | Status: AC
  Filled 2018-02-19: qty 1

## 2018-02-19 MED ORDER — ACETIC ACID 5 % SOLN
Status: AC
  Filled 2018-02-19: qty 500

## 2018-02-19 MED ORDER — KETAMINE HCL 10 MG/ML IJ SOLN
INTRAMUSCULAR | Status: DC | PRN
  Administered 2018-02-19: 50 mg via INTRAVENOUS

## 2018-02-19 MED ORDER — FENTANYL CITRATE (PF) 250 MCG/5ML IJ SOLN
INTRAMUSCULAR | Status: AC
  Filled 2018-02-19: qty 5

## 2018-02-19 SURGICAL SUPPLY — 52 items
APPLICATOR COTTON TIP 6IN STRL (MISCELLANEOUS) ×4 IMPLANT
BAG SPEC RTRVL LRG 6X4 10 (ENDOMECHANICALS)
BLADE SURG 11 STRL SS (BLADE) ×4 IMPLANT
CABLE HIGH FREQUENCY MONO STRZ (ELECTRODE) IMPLANT
CLEANER TIP ELECTROSURG 2X2 (MISCELLANEOUS) ×3 IMPLANT
CLOSURE WOUND 1/2 X4 (GAUZE/BANDAGES/DRESSINGS) ×1
DILATOR CANAL MILEX (MISCELLANEOUS) IMPLANT
DRSG OPSITE POSTOP 3X4 (GAUZE/BANDAGES/DRESSINGS) IMPLANT
DURAPREP 26ML APPLICATOR (WOUND CARE) ×4 IMPLANT
ELECT REM PT RETURN 9FT ADLT (ELECTROSURGICAL) ×4
ELECTRODE REM PT RTRN 9FT ADLT (ELECTROSURGICAL) ×2 IMPLANT
GLOVE BIO SURGEON STRL SZ 6.5 (GLOVE) ×6 IMPLANT
GLOVE BIO SURGEONS STRL SZ 6.5 (GLOVE) ×2
GLOVE BIOGEL PI IND STRL 6 (GLOVE) ×2 IMPLANT
GLOVE BIOGEL PI IND STRL 7.0 (GLOVE) ×4 IMPLANT
GLOVE BIOGEL PI INDICATOR 6 (GLOVE) ×2
GLOVE BIOGEL PI INDICATOR 7.0 (GLOVE) ×4
GOWN STRL REUS W/TWL LRG LVL3 (GOWN DISPOSABLE) ×8 IMPLANT
NDL SAFETY ECLIPSE 18X1.5 (NEEDLE) ×2 IMPLANT
NDL SPNL 18GX3.5 QUINCKE PK (NEEDLE) ×1 IMPLANT
NEEDLE HYPO 18GX1.5 SHARP (NEEDLE) ×4
NEEDLE INSUFFLATION 120MM (ENDOMECHANICALS) ×4 IMPLANT
NEEDLE SPNL 18GX3.5 QUINCKE PK (NEEDLE) ×4 IMPLANT
NS IRRIG 1000ML POUR BTL (IV SOLUTION) ×4 IMPLANT
PACK LAPAROSCOPY BASIN (CUSTOM PROCEDURE TRAY) ×4 IMPLANT
PACK TRENDGUARD 450 HYBRID PRO (MISCELLANEOUS) ×2 IMPLANT
PACK TRENDGUARD 600 HYBRD PROC (MISCELLANEOUS) IMPLANT
PACK VAGINAL MINOR WOMEN LF (CUSTOM PROCEDURE TRAY) ×4 IMPLANT
PAD OB MATERNITY 4.3X12.25 (PERSONAL CARE ITEMS) ×4 IMPLANT
PENCIL BUTTON HOLSTER BLD 10FT (ELECTRODE) ×4 IMPLANT
POUCH SPECIMEN RETRIEVAL 10MM (ENDOMECHANICALS) IMPLANT
PROTECTOR NERVE ULNAR (MISCELLANEOUS) ×8 IMPLANT
SCOPETTES 8  STERILE (MISCELLANEOUS) ×2
SCOPETTES 8 STERILE (MISCELLANEOUS) ×2 IMPLANT
SET IRRIG TUBING LAPAROSCOPIC (IRRIGATION / IRRIGATOR) IMPLANT
SHEARS HARMONIC ACE PLUS 36CM (ENDOMECHANICALS) IMPLANT
SLEEVE XCEL OPT CAN 5 100 (ENDOMECHANICALS) ×6 IMPLANT
SPONGE SURGIFOAM ABS GEL 12-7 (HEMOSTASIS) ×3 IMPLANT
STRIP CLOSURE SKIN 1/2X4 (GAUZE/BANDAGES/DRESSINGS) ×3 IMPLANT
SUT VIC AB 0 CT1 27 (SUTURE) ×8
SUT VIC AB 0 CT1 27XBRD ANBCTR (SUTURE) ×4 IMPLANT
SUT VICRYL 0 UR6 27IN ABS (SUTURE) IMPLANT
SUT VICRYL 4-0 PS2 18IN ABS (SUTURE) ×4 IMPLANT
TOWEL OR 17X24 6PK STRL BLUE (TOWEL DISPOSABLE) ×8 IMPLANT
TRENDGUARD 450 HYBRID PRO PACK (MISCELLANEOUS) ×4
TRENDGUARD 600 HYBRID PROC PK (MISCELLANEOUS)
TROCAR OPTI TIP 5M 100M (ENDOMECHANICALS) ×4 IMPLANT
TROCAR XCEL NON-BLD 11X100MML (ENDOMECHANICALS) IMPLANT
TUBING NON-CON 1/4 X 20 CONN (TUBING) ×3 IMPLANT
TUBING NON-CON 1/4 X 20' CONN (TUBING) ×1
WARMER LAPAROSCOPE (MISCELLANEOUS) ×4 IMPLANT
YANKAUER SUCT BULB TIP NO VENT (SUCTIONS) ×4 IMPLANT

## 2018-02-19 NOTE — H&P (Signed)
Rhonda Cabrera is an 32 y.o. female.  married P6 here today for a diagnostic laparoscopy. 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 She had a colpo 5/18 with only koilocytotic atypia. The colpo was then repeated 12/18 and showed 1. 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 2. 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 - NO INVASION   She also report severely painful periods. This has been going on for several years, worsening. She has dyspareunia for the last 6+ months. The pelvic pain is all month long but MUCH worse with her periods.  Her periods are monthly, lasting abotu 4-5 days.  She has had a BTL     Patient's last menstrual period was 02/10/2018 (exact date).    Past Medical History:  Diagnosis Date  . Abnormal Pap smear   . Anemia   . Anxiety   . Asthma   . Bipolar 1 disorder (Rhonda Cabrera)   . Chlamydia 07/12/2012  . Depression   . Family history of adverse reaction to anesthesia   . Headache    [place]  . Hypertension    with pregnancy only  . Opiate addiction (Rhonda Cabrera)   . Pneumonia 2017   used inhaler   . Polysubstance abuse (Rhonda Cabrera)   . PONV (postoperative nausea and vomiting)    SEVERE ITCHING AFTER EPIDURAL    Past Surgical History:  Procedure Laterality Date  . CHOLECYSTECTOMY  11/04/2012   Procedure: LAPAROSCOPIC CHOLECYSTECTOMY WITH INTRAOPERATIVE CHOLANGIOGRAM;  Surgeon: Imogene Burn. Rhonda Dover, MD;  Location: [facility];  Service: General;  Laterality: N/A;  . TUBAL LIGATION  09/08/2011   Procedure: POST PARTUM TUBAL LIGATION;  Surgeon: Rhonda Reeve, MD;  Location: [facility];  Service: Gynecology;  Laterality: Bilateral;  . vaginal deliveries      Family History  Problem Relation Age of Onset  . Drug abuse Mother   . Drug abuse Father   . Diabetes Maternal  Grandmother   . Hypertension Maternal Grandmother   . Diabetes Paternal Grandmother   . Diabetes Paternal Grandfather     Social History:  reports that she has been smoking cigarettes.  She has a 6.00 pack-year smoking history. she has never used smokeless tobacco. She reports that she does not drink alcohol or use drugs.  Allergies: No Known Allergies  Medications Prior to Admission  Medication Sig Dispense Refill Last Dose  . Buprenorphine HCl-Naloxone HCl (SUBOXONE) 8-2 MG FILM Place 1 Film under the tongue 2 (two) times daily.   02/18/2018 at Unknown time  . ibuprofen (ADVIL,MOTRIN) 800 MG tablet Take 800 mg by mouth daily as needed (migraines).   Past Month at Unknown time  . Multiple Vitamin (MULTIVITAMIN WITH MINERALS) TABS tablet Take 1 tablet by mouth daily.   Past Month at Unknown time  . Multiple Vitamins-Minerals (HAIR SKIN AND NAILS FORMULA) TABS Take 1 tablet by mouth daily.   Past Month at Unknown time  . QUEtiapine (SEROQUEL XR) 300 MG 24 hr tablet Take 300 mg by mouth at bedtime.   Past Week at Unknown time  . beclomethasone (QVAR) 80 MCG/ACT inhaler Inhale 1 puff into the lungs 2 (two) times daily. (Patient not taking: Reported on 02/04/2018) 1 Inhaler 5 Not Taking at Unknown time  . cephALEXin (KEFLEX) 500 MG capsule Take 1 capsule (500 mg total) by mouth 2 (two) times daily for  7 days. 14 capsule 0   . naproxen (NAPROSYN) 500 MG tablet Take 1 tablet (500 mg total) by mouth 2 (two) times daily with a meal. (Patient not taking: Reported on 02/04/2018) 30 tablet 0 Completed Course at Unknown time  . naproxen sodium (ALEVE) 220 MG tablet Take 440 mg by mouth daily as needed (headaches).   More than a month at Unknown time    ROS  Blood pressure 108/78, pulse 81, temperature 98.2 F (36.8 C), temperature source Oral, resp. rate 16, last menstrual period 02/10/2018, SpO2 100 %. Physical Exam  Breathing, conversing, and ambulating normally Well nourished, well hydrated Black  female, no apparent distress Heart- rrr Lungs- CTAB Abd- benign   Results for orders placed or performed during the hospital encounter of 02/19/18 (from the past 24 hour(s))  Pregnancy, urine     Status: None   Collection Time: 02/19/18 10:30 AM  Result Value Ref Range   Preg Test, Ur NEGATIVE NEGATIVE    No results found.  Assessment/Plan: Chronic pelvic pain- plan for diagnostic laparoscopy HGSIL- plan for [facility]  She understands the risks of surgery, including, but not to infection, bleeding, DVTs, damage to bowel, bladder, ureters. She wishes to proceed.     Rhonda Cabrera 02/19/2018, 11:58 AM

## 2018-02-19 NOTE — Transfer of Care (Signed)
Immediate Anesthesia Transfer of Care Note  Patient: Rhonda Cabrera  Procedure(s) Performed: CONIZATION CERVIX WITH BIOPSY - COLD KNIFE (N/A Vagina ) LAPAROSCOPY DIAGNOSTIC WITH PERITONEAL BIOPSIES (N/A Abdomen)  Patient Location: PACU  Anesthesia Type:General  Level of Consciousness: awake, alert  and oriented  Airway & Oxygen Therapy: Patient Spontanous Breathing and Patient connected to nasal cannula oxygen  Post-op Assessment: Report given to RN and Post -op Vital signs reviewed and stable  Post vital signs: Reviewed and stable  Last Vitals:  Vitals:   02/19/18 1048  BP: 108/78  Pulse: 81  Resp: 16  Temp: 36.8 C  SpO2: 100%    Last Pain:  Vitals:   02/19/18 1048  TempSrc: Oral  PainSc: 5       Patients Stated Pain Goal: 4 (81/85/63 1497)  Complications: No apparent anesthesia complications

## 2018-02-19 NOTE — Anesthesia Preprocedure Evaluation (Addendum)
Anesthesia Evaluation  Patient identified by MRN, date of birth, ID band Patient awake    Reviewed: Allergy & Precautions, NPO status , Patient's Chart, lab work & pertinent test results  Airway Mallampati: I  TM Distance: >3 FB Neck ROM: Full    Dental no notable dental hx.    Pulmonary Current Smoker,    Pulmonary exam normal breath sounds clear to auscultation       Cardiovascular hypertension, negative cardio ROS Normal cardiovascular exam Rhythm:Regular Rate:Normal     Neuro/Psych Bipolar Disorder negative neurological ROS  negative psych ROS   GI/Hepatic negative GI ROS, (+)     substance abuse (opiates)  ,   Endo/Other  negative endocrine ROS  Renal/GU negative Renal ROS  negative genitourinary   Musculoskeletal negative musculoskeletal ROS (+)   Abdominal   Peds negative pediatric ROS (+)  Hematology negative hematology ROS (+)   Anesthesia Other Findings   Reproductive/Obstetrics negative OB ROS                            Anesthesia Physical Anesthesia Plan  ASA: II  Anesthesia Plan: General   Post-op Pain Management:    Induction: Intravenous  PONV Risk Score and Plan: 2 and Ondansetron and Treatment may vary due to age or medical condition  Airway Management Planned: Oral ETT  Additional Equipment:   Intra-op Plan:   Post-operative Plan: Extubation in OR  Informed Consent: I have reviewed the patients History and Physical, chart, labs and discussed the procedure including the risks, benefits and alternatives for the proposed anesthesia with the patient or authorized representative who has indicated his/her understanding and acceptance.   Dental advisory given  Plan Discussed with: CRNA  Anesthesia Plan Comments:        Anesthesia Quick Evaluation

## 2018-02-19 NOTE — Anesthesia Postprocedure Evaluation (Signed)
Anesthesia Post Note  Patient: OMARI KOSLOSKY  Procedure(s) Performed: CONIZATION CERVIX WITH BIOPSY - COLD KNIFE (N/A Vagina ) LAPAROSCOPY DIAGNOSTIC WITH PERITONEAL BIOPSIES (N/A Abdomen)     Patient location during evaluation: PACU Anesthesia Type: General Level of consciousness: awake and alert Pain management: pain level controlled Vital Signs Assessment: post-procedure vital signs reviewed and stable Respiratory status: spontaneous breathing, nonlabored ventilation, respiratory function stable and patient connected to nasal cannula oxygen Cardiovascular status: blood pressure returned to baseline and stable Postop Assessment: no apparent nausea or vomiting Anesthetic complications: no    Last Vitals:  Vitals:   02/19/18 1545 02/19/18 1610  BP:  110/72  Pulse: 100 82  Resp: 12 18  Temp:    SpO2: 99% 100%    Last Pain:  Vitals:   02/19/18 1048  TempSrc: Oral  PainSc: 5    Pain Goal: Patients Stated Pain Goal: 4 (02/19/18 1048)               Montez Hageman

## 2018-02-19 NOTE — Anesthesia Procedure Notes (Signed)
Procedure Name: Intubation Date/Time: 02/19/2018 12:16 PM Performed by: Raenette Rover, CRNA Pre-anesthesia Checklist: Patient identified, Emergency Drugs available, Suction available and Patient being monitored Patient Re-evaluated:Patient Re-evaluated prior to induction Oxygen Delivery Method: Circle system utilized Preoxygenation: Pre-oxygenation with 100% oxygen Induction Type: IV induction Ventilation: Mask ventilation without difficulty Laryngoscope Size: Mac and 3 Grade View: Grade II Tube type: Oral Tube size: 7.0 mm Number of attempts: 1 Airway Equipment and Method: Stylet Placement Confirmation: ETT inserted through vocal cords under direct vision,  positive ETCO2,  CO2 detector and breath sounds checked- equal and bilateral Secured at: 22 cm Tube secured with: Tape Dental Injury: Teeth and Oropharynx as per pre-operative assessment

## 2018-02-19 NOTE — Op Note (Addendum)
02/19/2018  1:23 PM  PATIENT:  Rhonda Cabrera  32 y.o. female  PRE-OPERATIVE DIAGNOSIS:  HGSIL Dysmenorrhea  POST-OPERATIVE DIAGNOSIS:  periumbilical adhesions, stage 1 endometriosis  PROCEDURE:  Procedure(s): CONIZATION CERVIX WITH BIOPSY - COLD KNIFE (N/A) LAPAROSCOPY DIAGNOSTIC WITH PERITONEAL BIOPSIES (N/A)  SURGEON:  Surgeon(s) and Role:    * Gretchen Weinfeld C, MD - Primary   ANESTHESIA:   local and general  EBL:  150 mL   BLOOD ADMINISTERED:none  DRAINS: none   LOCAL MEDICATIONS USED:  MARCAINE     SPECIMEN:  Source of Specimen:  peritoneal biopsies from the left posterior cul de sac, cone biopsy of cervix, endocervical curettings  DISPOSITION OF SPECIMEN:  PATHOLOGY  COUNTS:  YES  TOURNIQUET:  * No tourniquets in log *  DICTATION: .Dragon Dictation  PLAN OF CARE: Discharge to home after PACU  PATIENT DISPOSITION:  PACU - hemodynamically stable.   Delay start of Pharmacological VTE agent (>24hrs) due to surgical blood loss or risk of bleeding: not applicable  The risks, benefits, and alternatives of surgery were explained, understood, accepted.  In the operating room she was placed in the dorsal lithotomy position, and general anesthesia was given without complication. Her abdomen and vagina were prepped and draped in the usual sterile fashion. A Foley catheter was placed.  A timeout procedure was done. A bimanual exam revealed a small anteverted and mobile uterus. Her adnexa felt normal. A Hulka manipulator was placed.  Gloves were changed, and attention was turned to the abdomen. Approximately the 5 mL of 0.5% Marcaine was injected into the umbilicus. A vertical incision was made at the site, as opposed to the transverse, infraumbilical incision that was made with her previous laparoscopy (GB removal). A Veress needle was placed intraperitoneally. Low-flow CO2 was used to insufflate the abdomen to approximately 3-1/2 L. Once a good pneumoperitoneum was established,  a 5 mm Excel trocar was placed. Laparoscopy confirmed correct placement. A 5 mm port was placed in the left lower quadrant under direct laparoscopic visualization after injecting 0.5% Marcaine in the incision sites. I noted 2 small (about 2 mm each) lesions of endometriosis in the left side of the posterior cul de sac and the left uterosacral ligament. I placed a 5 mm port in the right lower quadrant as well so that I could get to the lesions with the biopsy forceps. I then removed both lesions and sent the specimens to pathology. Hemostasis was noted. I placed the camera in the lower left port and noted some periumbilical omental adhesions. The trocar did not appear to be anywhere near bowel. The CO2 was allowed to escape from the abdomen.  I removed the 5 mm ports and noted hemostasis.  A subcuticular closure was done with 4-0 Vicryl suture at all incision sites. I then proceeded with the CKC portion of the case.  A weighted speculum was placed in the posterior aspect the vagina and the cervix was visualized. Anterior lip of the cervix was grasped with a single-tooth tenaculum. Lugol's solution was placed on the cervix. No non-staining lesion was noted.  A cone shaped specimen of the cervix was removed and endocervical curettings were obtained. The cone bed was cauterized with the Bovie. Hemostasis was noted. A pursestring closure of the cervix was done with a 2-0 Vicryl suture. Excellent hemostasis was noted. The instrument, sponge, and needle counts were correct. She was taken to the recovery room in stable condition. She tolerated the procedure well.  She was extubated and taken to the recovery room in stable condition.  [name], MD, [place] Attending Physician, [place]

## 2018-02-19 NOTE — Discharge Instructions (Signed)
DISCHARGE INSTRUCTIONS: Laparoscopy  The following instructions have been prepared to help you care for yourself upon your return home today.  Wound care:  Do not get the incision wet for the first 24 hours. The incision should be kept clean and dry.  The Band-Aids or dressings may be removed the day after surgery.  Should the incision become sore, red, and swollen after the first week, check with your doctor.  Personal hygiene:  Shower the day after your procedure.  Activity and limitations:  Do NOT drive or operate any equipment today.  Do NOT lift anything more than 15 pounds for 2-3 weeks after surgery.  Do NOT rest in bed all day.  Walking is encouraged. Walk each day, starting slowly with 5-minute walks 3 or 4 times a day. Slowly increase the length of your walks.  Walk up and down stairs slowly.  Do NOT do strenuous activities, such as golfing, playing tennis, bowling, running, biking, weight lifting, gardening, mowing, or vacuuming for 2-4 weeks. Ask your doctor when it is okay to start.  Diet: Eat a light meal as desired this evening. You may resume your usual diet tomorrow.  Return to work: This is dependent on the type of work you do. For the most part you can return to a desk job within a week of surgery. If you are more active at work, please discuss this with your doctor.  What to expect after your surgery: You may have a slight burning sensation when you urinate on the first day. You may have a very small amount of blood in the urine. Expect to have a small amount of vaginal discharge/light bleeding for 1-2 weeks. It is not unusual to have abdominal soreness and bruising for up to 2 weeks. You may be tired and need more rest for about 1 week. You may experience shoulder pain for 24-72 hours. Lying flat in bed may relieve it.  Call your doctor for any of the following:  Develop a fever of 100.4 or greater  Inability to urinate 6 hours after discharge from  hospital  Severe pain not relieved by pain medications  Persistent of heavy bleeding at incision site  Redness or swelling around incision site after a week  Increasing nausea or vomiting  Patient Signature________________________________________ Nurse Signature_________________________________________Diagnostic Laparoscopy, Care After Refer to this sheet in the next few weeks. These instructions provide you with information about caring for yourself after your procedure. Your health care provider may also give you more specific instructions. Your treatment has been planned according to current medical practices, but problems sometimes occur. Call your health care provider if you have any problems or questions after your procedure. What can I expect after the procedure? After your procedure, it is common to have mild discomfort in the throat and abdomen. Follow these instructions at home:  Take over-the-counter and prescription medicines only as told by your health care provider.  Do not drive for 24 hours if you received a sedative.  Return to your normal activities as told by your health care provider.  Do not take baths, swim, or use a hot tub until your health care provider approves. You may shower.  Follow instructions from your health care provider about how to take care of your incision. Make sure you: ? Wash your hands with soap and water before you change your bandage (dressing). If soap and water are not available, use hand sanitizer. ? Change your dressing as told by your health care provider. ?  Leave stitches (sutures), skin glue, or adhesive strips in place. These skin closures may need to stay in place for 2 weeks or longer. If adhesive strip edges start to loosen and curl up, you may trim the loose edges. Do not remove adhesive strips completely unless your health care provider tells you to do that.  Check your incision area every day for signs of infection. Check  for: ? More redness, swelling, or pain. ? More fluid or blood. ? Warmth. ? Pus or a bad smell.  It is your responsibility to get the results of your procedure. Ask your health care provider or the department performing the procedure when your results will be ready. Contact a health care provider if:  There is new pain in your shoulders.  You feel light-headed or faint.  You are unable to pass gas or unable to have a bowel movement.  You feel nauseous or you vomit.  You develop a rash.  You have more redness, swelling, or pain around your incision.  You have more fluid or blood coming from your incision.  Your incision feels warm to the touch.  You have pus or a bad smell coming from your incision.  You have a fever or chills. Get help right away if:  Your pain is getting worse.  You have ongoing vomiting.  The edges of your incision open up.  You have trouble breathing.  You have chest pain. This information is not intended to replace advice given to you by your health care provider. Make sure you discuss any questions you have with your health care provider. Document Released: 11/15/2015 Document Revised: 05/11/2016 Document Reviewed: 08/17/2015 Elsevier Interactive Patient Education  2018 [employer].

## 2018-02-20 ENCOUNTER — Encounter (HOSPITAL_COMMUNITY): Payer: Self-pay | Admitting: Obstetrics & Gynecology

## 2018-02-20 ENCOUNTER — Other Ambulatory Visit: Payer: Self-pay | Admitting: Obstetrics & Gynecology

## 2018-02-20 MED ORDER — HYDROCODONE-ACETAMINOPHEN 5-325 MG PO TABS: 1.0000 | ORAL_TABLET | Freq: Four times a day (QID) | ORAL | 0 refills | Status: DC | PRN

## 2018-02-20 NOTE — Progress Notes (Signed)
Pt called after hours nurse  Had Dx scope + cervical conization  Was discharged on ibuprofen ony, has history of narcotic issues  Called because she is not able to tolerate the pain, can't get below a level of 6  I e prescribed:  Meds ordered this encounter  Medications  . HYDROcodone-acetaminophen (NORCO/VICODIN) 5-325 MG tablet    Sig: Take 1 tablet by mouth every 6 (six) hours as needed.    Dispense:  15 tablet    Refill:  0

## 2018-02-22 ENCOUNTER — Telehealth: Payer: Self-pay | Admitting: *Deleted

## 2018-02-22 NOTE — Telephone Encounter (Signed)
Rhonda Cabrera called and left a message 02/20/18 5:11 pm stating she  Is leaving amessage for Dr. Hulan Fray or nurse that she had surgery 02/19/18 and is having bad break thru pain.    I called [name] and left a message if you are having severe pain , go to [facility]. If not severe or other issues call our office today by 5 , or Monday.

## 2018-03-04 ENCOUNTER — Other Ambulatory Visit (HOSPITAL_COMMUNITY)
Admission: RE | Admit: 2018-03-04 | Discharge: 2018-03-04 | Disposition: A | Discharge status: Home / Self Care | Payer: Medicare Other | Source: Ambulatory Visit | Attending: Obstetrics & Gynecology | Admitting: Obstetrics & Gynecology

## 2018-03-04 ENCOUNTER — Ambulatory Visit (INDEPENDENT_AMBULATORY_CARE_PROVIDER_SITE_OTHER): Payer: Medicare Other | Admitting: Obstetrics & Gynecology

## 2018-03-04 ENCOUNTER — Encounter: Payer: Self-pay | Admitting: Obstetrics & Gynecology

## 2018-03-04 VITALS — BP 120/73 | HR 87 | Ht 67.0 in | Wt 158.8 lb

## 2018-03-04 DIAGNOSIS — Z9889 Other specified postprocedural states: Secondary | ICD-10-CM

## 2018-03-04 DIAGNOSIS — N898 Other specified noninflammatory disorders of vagina: Secondary | ICD-10-CM | POA: Insufficient documentation

## 2018-03-04 MED ORDER — LEVONORGEST-ETH ESTRAD 91-DAY 0.15-0.03 &0.01 MG PO TABS: 1.0000 | ORAL_TABLET | Freq: Every day | ORAL | 4 refills | Status: DC

## 2018-03-04 NOTE — Progress Notes (Signed)
Pt is in the office for post op follow up. Pt reports feeling "crampy".

## 2018-03-04 NOTE — Progress Notes (Signed)
   Subjective:    Patient ID: ERMA RAICHE, female    DOB: February 26, 1986, 32 y.o.   MRN: [id number]  HPI 32 yo married lady here 2 1/2 weeks postop for a postop visit. She has had a vaginal itch, has sex last night. She had some pelvic "aching" 2 nights ago. She is ready to go back to work. She had sex last night.   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 Start Camrese Come back in 4 months/prn sooner [employer] prep sent She declines a flu vaccine

## 2018-03-05 LAB — CERVICOVAGINAL ANCILLARY ONLY
BACTERIAL VAGINITIS: NEGATIVE
CANDIDA VAGINITIS: NEGATIVE

## 2018-03-07 ENCOUNTER — Telehealth: Payer: Self-pay

## 2018-03-07 NOTE — Telephone Encounter (Signed)
TC to pt regarding message Pt had post op on 03/04/18 from 02/19/18 conization Pt inquiring about taking a bath, Consulted w/provider pt ok to soak and gently pat dry.  pt was also advised at visit No intercourse x 1 more week.  Pt voiced understanding.

## 2018-04-10 ENCOUNTER — Emergency Department (HOSPITAL_COMMUNITY)
Admission: EM | Admit: 2018-04-10 | Discharge: 2018-04-11 | Disposition: A | Discharge status: Home / Self Care | Payer: Medicare Other | Attending: Emergency Medicine | Admitting: Emergency Medicine

## 2018-04-10 ENCOUNTER — Other Ambulatory Visit: Payer: Self-pay

## 2018-04-10 ENCOUNTER — Emergency Department (HOSPITAL_COMMUNITY): Payer: Medicare Other

## 2018-04-10 ENCOUNTER — Encounter (HOSPITAL_COMMUNITY): Payer: Self-pay | Admitting: Emergency Medicine

## 2018-04-10 DIAGNOSIS — R079 Chest pain, unspecified: Secondary | ICD-10-CM | POA: Insufficient documentation

## 2018-04-10 DIAGNOSIS — Z5321 Procedure and treatment not carried out due to patient leaving prior to being seen by health care provider: Secondary | ICD-10-CM | POA: Diagnosis not present

## 2018-04-10 LAB — CBC
HEMATOCRIT: 37.7 % (ref 36.0–46.0)
Hemoglobin: 12.1 g/dL (ref 12.0–15.0)
MCH: 28.1 pg (ref 26.0–34.0)
MCHC: 32.1 g/dL (ref 30.0–36.0)
MCV: 87.5 fL (ref 78.0–100.0)
PLATELETS: 315 10*3/uL (ref 150–400)
RBC: 4.31 MIL/uL (ref 3.87–5.11)
RDW: 13.4 % (ref 11.5–15.5)
WBC: 7.8 10*3/uL (ref 4.0–10.5)

## 2018-04-10 LAB — BASIC METABOLIC PANEL
Anion gap: 6 (ref 5–15)
BUN: 9 mg/dL (ref 6–20)
CHLORIDE: 106 mmol/L (ref 101–111)
CO2: 26 mmol/L (ref 22–32)
CREATININE: 0.68 mg/dL (ref 0.44–1.00)
Calcium: 9.4 mg/dL (ref 8.9–10.3)
Glucose, Bld: 114 mg/dL — ABNORMAL HIGH (ref 65–99)
POTASSIUM: 3.6 mmol/L (ref 3.5–5.1)
SODIUM: 138 mmol/L (ref 135–145)

## 2018-04-10 LAB — I-STAT TROPONIN, ED: Troponin i, poc: 0 ng/mL (ref 0.00–0.08)

## 2018-04-10 LAB — I-STAT BETA HCG BLOOD, ED (MC, WL, AP ONLY)

## 2018-04-10 NOTE — ED Triage Notes (Signed)
Pt complaining of central chest pain radiating to back, describes as pressure. Pt reports she was not doing anything when the pain started 1 hour ago. Pt denies N/V.

## 2018-04-11 NOTE — ED Notes (Addendum)
Unable to locate patient in lobby. Presumed to have left without being seen.

## 2018-04-18 DIAGNOSIS — M25512 Pain in left shoulder: Secondary | ICD-10-CM | POA: Insufficient documentation

## 2018-04-18 DIAGNOSIS — K5900 Constipation, unspecified: Secondary | ICD-10-CM | POA: Insufficient documentation

## 2018-04-18 DIAGNOSIS — R102 Pelvic and perineal pain: Secondary | ICD-10-CM | POA: Diagnosis not present

## 2018-04-18 DIAGNOSIS — Z79899 Other long term (current) drug therapy: Secondary | ICD-10-CM | POA: Insufficient documentation

## 2018-04-18 DIAGNOSIS — F17210 Nicotine dependence, cigarettes, uncomplicated: Secondary | ICD-10-CM | POA: Insufficient documentation

## 2018-04-18 DIAGNOSIS — R0789 Other chest pain: Secondary | ICD-10-CM | POA: Diagnosis not present

## 2018-04-18 DIAGNOSIS — R1013 Epigastric pain: Secondary | ICD-10-CM | POA: Insufficient documentation

## 2018-04-19 ENCOUNTER — Encounter (HOSPITAL_COMMUNITY): Payer: Self-pay | Admitting: *Deleted

## 2018-04-19 ENCOUNTER — Emergency Department (HOSPITAL_COMMUNITY)
Admission: EM | Admit: 2018-04-19 | Discharge: 2018-04-19 | Disposition: A | Discharge status: Home / Self Care | Payer: Medicare Other | Attending: Emergency Medicine | Admitting: Emergency Medicine

## 2018-04-19 DIAGNOSIS — R1013 Epigastric pain: Secondary | ICD-10-CM

## 2018-04-19 DIAGNOSIS — K5900 Constipation, unspecified: Secondary | ICD-10-CM

## 2018-04-19 LAB — CBC
HEMATOCRIT: 38.2 % (ref 36.0–46.0)
HEMOGLOBIN: 12.4 g/dL (ref 12.0–15.0)
MCH: 28.7 pg (ref 26.0–34.0)
MCHC: 32.5 g/dL (ref 30.0–36.0)
MCV: 88.4 fL (ref 78.0–100.0)
Platelets: 287 10*3/uL (ref 150–400)
RBC: 4.32 MIL/uL (ref 3.87–5.11)
RDW: 13.5 % (ref 11.5–15.5)
WBC: 6.5 10*3/uL (ref 4.0–10.5)

## 2018-04-19 LAB — I-STAT BETA HCG BLOOD, ED (MC, WL, AP ONLY): I-stat hCG, quantitative: <5 m[IU]/mL (ref <5)

## 2018-04-19 LAB — COMPREHENSIVE METABOLIC PANEL
ALBUMIN: 4 g/dL (ref 3.5–5.0)
ALT: 11 U/L — ABNORMAL LOW (ref 14–54)
ANION GAP: 4 — AB (ref 5–15)
AST: 14 U/L — AB (ref 15–41)
Alkaline Phosphatase: 70 U/L (ref 38–126)
BUN: 11 mg/dL (ref 6–20)
CHLORIDE: 105 mmol/L (ref 101–111)
CO2: 25 mmol/L (ref 22–32)
Calcium: 9 mg/dL (ref 8.9–10.3)
Creatinine, Ser: 0.73 mg/dL (ref 0.44–1.00)
GFR calc Af Amer: >60 mL/min (ref >60)
GFR calc non Af Amer: >60 mL/min (ref >60)
GLUCOSE: 95 mg/dL (ref 65–99)
POTASSIUM: 4.6 mmol/L (ref 3.5–5.1)
Sodium: 134 mmol/L — ABNORMAL LOW (ref 135–145)
Total Bilirubin: 0.2 mg/dL — ABNORMAL LOW (ref 0.3–1.2)
Total Protein: 6.9 g/dL (ref 6.5–8.1)

## 2018-04-19 LAB — URINALYSIS, ROUTINE W REFLEX MICROSCOPIC
BILIRUBIN URINE: NEGATIVE
Glucose, UA: NEGATIVE mg/dL
Hgb urine dipstick: NEGATIVE
KETONES UR: NEGATIVE mg/dL
LEUKOCYTES UA: NEGATIVE
NITRITE: NEGATIVE
Protein, ur: NEGATIVE mg/dL
Specific Gravity, Urine: 1.015 (ref 1.005–1.030)
pH: 6 (ref 5.0–8.0)

## 2018-04-19 LAB — LIPASE, BLOOD: LIPASE: 24 U/L (ref 11–51)

## 2018-04-19 MED ORDER — POLYETHYLENE GLYCOL 3350 17 G PO PACK: 17.0000 g | PACK | Freq: Every day | ORAL | 0 refills | Status: DC

## 2018-04-19 MED ORDER — OMEPRAZOLE 20 MG PO CPDR: 20.0000 mg | DELAYED_RELEASE_CAPSULE | Freq: Every day | ORAL | 0 refills | Status: DC

## 2018-04-19 MED ORDER — GI COCKTAIL ~~LOC~~
30.0000 mL | Freq: Once | ORAL | Status: AC
  Administered 2018-04-19: 30 mL via ORAL
  Filled 2018-04-19: qty 30

## 2018-04-19 NOTE — ED Triage Notes (Signed)
Pt c/o "abd pain that goes into chest and goes into my pelvis, it hurts when I walk".

## 2018-04-19 NOTE — ED Notes (Signed)
Patient sleeping soundly upon entering the room. Per patient, pain begins at the umbilicus and radiates through to her pelvis and chest. Patient denies nausea and vomiting and denies diarrhea.
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
## 2018-09-05 ENCOUNTER — Other Ambulatory Visit: Payer: Self-pay

## 2018-09-05 ENCOUNTER — Encounter (HOSPITAL_COMMUNITY): Payer: Self-pay

## 2018-09-05 ENCOUNTER — Ambulatory Visit (HOSPITAL_COMMUNITY)
Admission: EM | Admit: 2018-09-05 | Discharge: 2018-09-05 | Disposition: A | Discharge status: Home / Self Care | Payer: Medicare Other | Attending: Family Medicine | Admitting: Family Medicine

## 2018-09-05 DIAGNOSIS — N898 Other specified noninflammatory disorders of vagina: Secondary | ICD-10-CM

## 2018-09-05 DIAGNOSIS — F319 Bipolar disorder, unspecified: Secondary | ICD-10-CM | POA: Diagnosis not present

## 2018-09-05 DIAGNOSIS — F17210 Nicotine dependence, cigarettes, uncomplicated: Secondary | ICD-10-CM | POA: Insufficient documentation

## 2018-09-05 DIAGNOSIS — H9202 Otalgia, left ear: Secondary | ICD-10-CM | POA: Insufficient documentation

## 2018-09-05 DIAGNOSIS — Z113 Encounter for screening for infections with a predominantly sexual mode of transmission: Secondary | ICD-10-CM | POA: Insufficient documentation

## 2018-09-05 DIAGNOSIS — F419 Anxiety disorder, unspecified: Secondary | ICD-10-CM | POA: Diagnosis not present

## 2018-09-05 MED ORDER — PREDNISONE 20 MG PO TABS: 40.0000 mg | ORAL_TABLET | Freq: Every day | ORAL | 0 refills | Status: AC
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
## 2018-09-05 NOTE — ED Triage Notes (Signed)
Pt states she has left ear pain and would like to be tested for STDs.

## 2018-09-05 NOTE — Discharge Instructions (Signed)
Push fluids to ensure adequate hydration and keep secretions thin.  Tylenol and/or ibuprofen as needed for pain or fevers.  5 days of prednisone to help with ear pain and headache.  Will notify you of any positive findings from your screenings and if any changes to treatment are needed.   If symptoms worsen or do not improve in the next week to return to be seen or to follow up with your PCP.

## 2018-09-06 ENCOUNTER — Telehealth: Payer: Self-pay | Admitting: Student in an Organized Health Care Education/Training Program

## 2018-09-06 ENCOUNTER — Telehealth (HOSPITAL_COMMUNITY): Payer: Self-pay

## 2018-09-06 ENCOUNTER — Other Ambulatory Visit: Payer: Self-pay | Admitting: Student in an Organized Health Care Education/Training Program

## 2018-09-06 LAB — CERVICOVAGINAL ANCILLARY ONLY
BACTERIAL VAGINITIS: POSITIVE — AB
Candida vaginitis: NEGATIVE
Chlamydia: NEGATIVE
NEISSERIA GONORRHEA: NEGATIVE
TRICH (WINDOWPATH): NEGATIVE

## 2018-09-06 LAB — HIV ANTIBODY (ROUTINE TESTING W REFLEX): HIV SCREEN 4TH GENERATION: NONREACTIVE

## 2018-09-06 LAB — RPR: RPR Ser Ql: NONREACTIVE

## 2018-09-06 MED ORDER — METRONIDAZOLE 500 MG PO TABS: 500.0000 mg | ORAL_TABLET | Freq: Two times a day (BID) | ORAL | 0 refills | Status: DC

## 2018-09-06 MED ORDER — FLUCONAZOLE 150 MG PO TABS: 150.0000 mg | ORAL_TABLET | Freq: Once | ORAL | 0 refills | Status: AC

## 2018-09-06 NOTE — Telephone Encounter (Signed)
Bacterial vaginosis is positive. This was not treated at the urgent care visit.  Flagyl 500 mg BID x 7 days #14 no refills sent to patients pharmacy of choice.  Pt called and made aware of results and new prescription. Answered all questions and pt verbalized understanding.

## 2018-09-06 NOTE — Progress Notes (Signed)
**  After Hours/ Emergency Line Call*  Received a call to report that Rhonda Cabrera was diagnosed and treated for BV today by urgent care. She reports that she always gets a yeast infection any time she takes antibiotics and she is concerned she will get one over the weekend due to taking antibiotics. She asks if we can send diflucan to prevent this.  Will order diflucan x1 for patient to take if she gets yeast infection symptoms over the weekend, however if she has vaginal symptoms despite her current treatment regimen she voices understanding that she may require an office visit next week.  Everrett Coombe, MD PGY-3 [facility]

## 2018-09-07 NOTE — Telephone Encounter (Signed)
err

## 2018-11-29 ENCOUNTER — Encounter (HOSPITAL_COMMUNITY): Payer: Self-pay | Admitting: Emergency Medicine

## 2018-11-29 ENCOUNTER — Ambulatory Visit (HOSPITAL_COMMUNITY)
Admission: EM | Admit: 2018-11-29 | Discharge: 2018-11-29 | Disposition: A | Discharge status: Home / Self Care | Payer: Medicare Other | Attending: Emergency Medicine | Admitting: Emergency Medicine

## 2018-11-29 DIAGNOSIS — G43009 Migraine without aura, not intractable, without status migrainosus: Secondary | ICD-10-CM | POA: Diagnosis not present

## 2018-11-29 MED ORDER — NARATRIPTAN HCL 2.5 MG PO TABS: 2.5000 mg | ORAL_TABLET | ORAL | 0 refills | Status: DC | PRN

## 2018-11-29 NOTE — ED Triage Notes (Signed)
Pt `presents to [facility] for assessment of headache x 3 hours.  Pt states she took aleve 20 minutes ago without relief.
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
## 2018-12-31 ENCOUNTER — Ambulatory Visit: Payer: Self-pay | Admitting: Family Medicine

## 2018-12-31 ENCOUNTER — Other Ambulatory Visit: Payer: Self-pay

## 2018-12-31 ENCOUNTER — Other Ambulatory Visit (HOSPITAL_COMMUNITY)
Admission: RE | Admit: 2018-12-31 | Discharge: 2018-12-31 | Disposition: A | Discharge status: Home / Self Care | Payer: Medicare Other | Source: Ambulatory Visit | Attending: Family Medicine | Admitting: Family Medicine

## 2018-12-31 ENCOUNTER — Ambulatory Visit (INDEPENDENT_AMBULATORY_CARE_PROVIDER_SITE_OTHER): Payer: Medicare Other | Admitting: Family Medicine

## 2018-12-31 ENCOUNTER — Encounter: Payer: Self-pay | Admitting: Family Medicine

## 2018-12-31 VITALS — BP 118/72 | HR 71 | Temp 98.7°F | Wt 153.0 lb

## 2018-12-31 DIAGNOSIS — N390 Urinary tract infection, site not specified: Secondary | ICD-10-CM

## 2018-12-31 DIAGNOSIS — R300 Dysuria: Secondary | ICD-10-CM | POA: Diagnosis not present

## 2018-12-31 DIAGNOSIS — N898 Other specified noninflammatory disorders of vagina: Secondary | ICD-10-CM | POA: Diagnosis present

## 2018-12-31 LAB — POCT URINALYSIS DIP (MANUAL ENTRY)
BILIRUBIN UA: NEGATIVE
Blood, UA: NEGATIVE
GLUCOSE UA: NEGATIVE mg/dL
Ketones, POC UA: NEGATIVE mg/dL
Nitrite, UA: NEGATIVE
Protein Ur, POC: NEGATIVE mg/dL
Spec Grav, UA: 1.025 (ref 1.010–1.025)
Urobilinogen, UA: 0.2 E.U./dL
pH, UA: 6.5 (ref 5.0–8.0)

## 2018-12-31 LAB — POCT UA - MICROSCOPIC ONLY

## 2018-12-31 LAB — POCT WET PREP (WET MOUNT)
Clue Cells Wet Prep Whiff POC: POSITIVE
TRICHOMONAS WET PREP HPF POC: ABSENT

## 2018-12-31 LAB — POCT URINE PREGNANCY: Preg Test, Ur: NEGATIVE

## 2018-12-31 MED ORDER — METRONIDAZOLE 500 MG PO TABS: 500.0000 mg | ORAL_TABLET | Freq: Two times a day (BID) | ORAL | 0 refills | Status: DC

## 2018-12-31 MED ORDER — FLUCONAZOLE 150 MG PO TABS: ORAL_TABLET | ORAL | 0 refills | Status: DC

## 2018-12-31 NOTE — Progress Notes (Signed)
Date of Visit: 12/31/2018   HPI:  Patient presents for same day appointment for vaginal discharge & odor.  Discharge - Having fishy smelling vaginal discharge. Just finished period. Mild pelvic discomfort on occasion. Sexually active with husband. Has six children. History of tubal ligation. History of endometriosis.   Also wants to be checked for UTI. Has history of recurrent UTI's. Has never seen urology. No burning with urination but is a little uncomfortable. Having urinary frequency. No hematuria but urine looked cloudy. No hesitancy. No fever. Has had a pinching feeling in her back that comes & goes.   ROS: See HPI.  [place]: history of MDD, bipolar disorder, opiate addiction, recurrent UTI  PHYSICAL EXAM: BP 118/72   Pulse 71   Temp 98.7 F (37.1 C) (Oral)   Wt 153 lb (69.4 kg)   SpO2 99%   BMI 23.96 kg/m  Gen: no acute distress, pleasant, cooperative, well appearing GU: normal appearing external genitalia without lesions. Vagina is moist with bloody discharge (at end of period). Mild fishy odor. Cervix normal in appearance. No cervical motion tenderness or tenderness on bimanual exam. No adnexal masses.  Back: no CVA tenderness  ASSESSMENT/PLAN:  Vaginal discharge - suspicious for BV by history & exam, wet prep confirms this. - rx flagyl 500mg  twice daily for 7 days - given rx for diflucan as patient reports also gets yeast infections with antibiotics - sent gc/chlamydia/trich as well  Possible UTI - initial UA not super impressive for UTI today.  - refer to urology for history of recurrent UTIs - send urine for cx   FOLLOW UP: Follow up as needed if symptoms worsen or do not improve.  Referring to urology  Delorse Limber. Ardelia Mems, [place]

## 2018-12-31 NOTE — Patient Instructions (Addendum)
Referring to urologist due to your history of recurrent UTIs Will call you if urine comes back suggesting UTI  Treating for BV for now   Bacterial Vaginosis  Bacterial vaginosis is a vaginal infection that occurs when the normal balance of bacteria in the vagina is disrupted. It results from an overgrowth of certain bacteria. This is the most common vaginal infection among women ages 80-44. Because bacterial vaginosis increases your risk for STIs (sexually transmitted infections), getting treated can help reduce your risk for chlamydia, gonorrhea, herpes, and HIV (human immunodeficiency virus). Treatment is also important for preventing complications in pregnant women, because this condition can cause an early (premature) delivery. What are the causes? This condition is caused by an increase in harmful bacteria that are normally present in small amounts in the vagina. However, the reason that the condition develops is not fully understood. What increases the risk? The following factors may make you more likely to develop this condition:  Having a new sexual partner or multiple sexual partners.  Having unprotected sex.  Douching.  Having an intrauterine device (IUD).  Smoking.  Drug and alcohol abuse.  Taking certain antibiotic medicines.  Being pregnant. You cannot get bacterial vaginosis from toilet seats, bedding, swimming pools, or contact with objects around you. What are the signs or symptoms? Symptoms of this condition include:  Grey or white vaginal discharge. The discharge can also be watery or foamy.  A fish-like odor with discharge, especially after sexual intercourse or during menstruation.  Itching in and around the vagina.  Burning or pain with urination. Some women with bacterial vaginosis have no signs or symptoms. How is this diagnosed? This condition is diagnosed based on:  Your medical history.  A physical exam of the vagina.  Testing a sample of  vaginal fluid under a microscope to look for a large amount of bad bacteria or abnormal cells. Your health care provider may use a cotton swab or a small wooden spatula to collect the sample. How is this treated? This condition is treated with antibiotics. These may be given as a pill, a vaginal cream, or a medicine that is put into the vagina (suppository). If the condition comes back after treatment, a second round of antibiotics may be needed. Follow these instructions at home: Medicines  Take over-the-counter and prescription medicines only as told by your health care provider.  Take or use your antibiotic as told by your health care provider. Do not stop taking or using the antibiotic even if you start to feel better. General instructions  If you have a female sexual partner, tell her that you have a vaginal infection. She should see her health care provider and be treated if she has symptoms. If you have a female sexual partner, he does not need treatment.  During treatment: ? Avoid sexual activity until you finish treatment. ? Do not douche. ? Avoid alcohol as directed by your health care provider. ? Avoid breastfeeding as directed by your health care provider.  Drink enough water and fluids to keep your urine clear or pale yellow.  Keep the area around your vagina and rectum clean. ? Wash the area daily with warm water. ? Wipe yourself from front to back after using the toilet.  Keep all follow-up visits as told by your health care provider. This is important. How is this prevented?  Do not douche.  Wash the outside of your vagina with warm water only.  Use protection when having sex. This includes latex  condoms and dental dams.  Limit how many sexual partners you have. To help prevent bacterial vaginosis, it is best to have sex with just one partner (monogamous).  Make sure you and your sexual partner are tested for STIs.  Wear cotton or cotton-lined underwear.  Avoid  wearing tight pants and pantyhose, especially during summer.  Limit the amount of alcohol that you drink.  Do not use any products that contain nicotine or tobacco, such as cigarettes and e-cigarettes. If you need help quitting, ask your health care provider.  Do not use illegal drugs. Where to find more information  Centers for Disease Control and Prevention: AppraiserFraud.fi  American Sexual Health Association (ASHA): www.ashastd.org  U.S. Department of Health and Financial controller, Office on Women's Health: DustingSprays.pl or SecuritiesCard.it Contact a health care provider if:  Your symptoms do not improve, even after treatment.  You have more discharge or pain when urinating.  You have a fever.  You have pain in your abdomen.  You have pain during sex.  You have vaginal bleeding between periods. Summary  Bacterial vaginosis is a vaginal infection that occurs when the normal balance of bacteria in the vagina is disrupted.  Because bacterial vaginosis increases your risk for STIs (sexually transmitted infections), getting treated can help reduce your risk for chlamydia, gonorrhea, herpes, and HIV (human immunodeficiency virus). Treatment is also important for preventing complications in pregnant women, because the condition can cause an early (premature) delivery.  This condition is treated with antibiotic medicines. These may be given as a pill, a vaginal cream, or a medicine that is put into the vagina (suppository). This information is not intended to replace advice given to you by your health care provider. Make sure you discuss any questions you have with your health care provider. Document Released: 12/04/2005 Document Revised: 04/09/2017 Document Reviewed: 08/19/2016 Elsevier Interactive Patient Education  2019 [employer].

## 2019-01-01 LAB — CERVICOVAGINAL ANCILLARY ONLY
Chlamydia: NEGATIVE
Neisseria Gonorrhea: NEGATIVE
Trichomonas: NEGATIVE

## 2019-01-02 ENCOUNTER — Telehealth: Payer: Self-pay | Admitting: Family Medicine

## 2019-01-02 LAB — URINE CULTURE

## 2019-01-02 MED ORDER — CEPHALEXIN 500 MG PO CAPS: 500.0000 mg | ORAL_CAPSULE | Freq: Two times a day (BID) | ORAL | 0 refills | Status: DC

## 2019-01-02 NOTE — Telephone Encounter (Signed)
Called patient to discuss urine cx which is growing >100k of e coli. Told patient of my plan to rx keflex, with the small chance that the sensitivities will come back as resistant to keflex and we would at that time need to switch to a different antibiotic. Patient prefers to wait until sensitivities have returned to start antibiotics for the UTI, so she is sure she is on the right one. She is feeling well otherwise. Will follow up on sensitivities and then send in rx. She is aware it may be another day before the sensitivities have resulted.  Leeanne Rio, MD

## 2019-01-02 NOTE — Telephone Encounter (Signed)
Attempted to reach patient to let her know that urine cx sensitivities have returned. No answer. LVM asking her to call us back.  When she returns the call please tell her: - the culture is now final - keflex will work for this bacteria - I have sent in a prescription for her to her pharmacy.  If she has any questions I am happy to speak with her. Thanks! Leeanne Rio, MD

## 2019-01-03 ENCOUNTER — Telehealth: Payer: Self-pay | Admitting: Family Medicine

## 2019-01-03 NOTE — Telephone Encounter (Signed)
**  After Hours/ Emergency Line Call**  Received a call to report that Rhonda Cabrera calling for "this is not an emergency I just wanted to ask a question about labs and knew I could reach someone".  Endorsing that patient was seen in office recently and had urine collected. Was told by Dr. Ardelia Mems at appointment that she would follow cultures and send in antibiotics if needed. Patient calling in to see if antibiotics were sent in.  Denying any current medical problems that need addressing.  Recommended that in the future patient call during normal business hours or use MyChart for non-emergency calls. Informed her that per telephone note on 01/02/19 that Dr. Ardelia Mems called but was unable to reach patient to inform her that cultures were final and keflex would work for this bacteria. RX was sent to her pharmacy.  Red flags discussed.  Will forward to PCP.  Caroline More, DO PGY-2, [facility] 01/03/2019 6:04 PM

## 2019-01-27 ENCOUNTER — Telehealth: Payer: Self-pay | Admitting: *Deleted

## 2019-01-27 ENCOUNTER — Ambulatory Visit (HOSPITAL_COMMUNITY)
Admission: EM | Admit: 2019-01-27 | Discharge: 2019-01-27 | Disposition: A | Discharge status: Home / Self Care | Payer: Medicare Other | Attending: Internal Medicine | Admitting: Internal Medicine

## 2019-01-27 ENCOUNTER — Encounter (HOSPITAL_COMMUNITY): Payer: Self-pay | Admitting: Emergency Medicine

## 2019-01-27 DIAGNOSIS — N3090 Cystitis, unspecified without hematuria: Secondary | ICD-10-CM | POA: Diagnosis present

## 2019-01-27 DIAGNOSIS — R300 Dysuria: Secondary | ICD-10-CM | POA: Diagnosis present

## 2019-01-27 LAB — POCT URINALYSIS DIP (DEVICE)
BILIRUBIN URINE: NEGATIVE
Glucose, UA: NEGATIVE mg/dL
Ketones, ur: NEGATIVE mg/dL
Nitrite: POSITIVE — AB
Protein, ur: NEGATIVE mg/dL
Specific Gravity, Urine: 1.015 (ref 1.005–1.030)
Urobilinogen, UA: 0.2 mg/dL (ref 0.0–1.0)
pH: 7.5 (ref 5.0–8.0)

## 2019-01-27 LAB — POCT PREGNANCY, URINE: Preg Test, Ur: NEGATIVE

## 2019-01-27 MED ORDER — FLUCONAZOLE 150 MG PO TABS: 150.0000 mg | ORAL_TABLET | ORAL | 0 refills | Status: DC

## 2019-01-27 MED ORDER — SULFAMETHOXAZOLE-TRIMETHOPRIM 800-160 MG PO TABS: 1.0000 | ORAL_TABLET | Freq: Two times a day (BID) | ORAL | 0 refills | Status: DC

## 2019-01-27 NOTE — ED Triage Notes (Signed)
Pt states she was treated a few weeks ago for uti symptmos, and has a referral for a urologist. States she didn't finish her antibiotics for last time.

## 2019-01-27 NOTE — Telephone Encounter (Signed)
Pt calls because she only took 2 doses of cephalexin that she was Rx in January before she misplaced them.  Informed pt of importance of finishing all meds as rx'd.  She is now having UTI symptoms again.  Since we do not have nay appts left and pt dose not want to wait until tomorrow, she will go to urgent care today. Rhonda Cabrera, [place], CMA

## 2019-01-27 NOTE — ED Provider Notes (Signed)
MRN: [id number] DOB: January 05, 1986  Subjective:   Rhonda Cabrera is a 33 y.o. female presenting for 1 day history of recurrent dysuria, urinary frequency and cloudy malordorous urine, nausea and vomiting. Has not tried any medications for relief. She had an UTI in mid January, admits non-compliance with her Keflex.   No current facility-administered medications for this encounter.   Current Outpatient Medications:  .  Buprenorphine HCl-Naloxone HCl (SUBOXONE) 8-2 MG FILM, Place 1 Film under the tongue daily. , Disp: , Rfl:  .  fluconazole (DIFLUCAN) 150 MG tablet, If develop yeast infection from antibiotic, take one pill once. Repeat 3 days later if symptoms not improved. (Patient not taking: Reported on 01/27/2019), Disp: 2 tablet, Rfl: 0 .  metroNIDAZOLE (FLAGYL) 500 MG tablet, Take 1 tablet (500 mg total) by mouth 2 (two) times daily. For 7 days. Do not drink alcohol while taking this medication., Disp: 14 tablet, Rfl: 0 .  Multiple Vitamin (MULTIVITAMIN WITH MINERALS) TABS tablet, Take 1 tablet by mouth daily., Disp: , Rfl:  .  naratriptan (AMERGE) 2.5 MG tablet, Take 1 tablet (2.5 mg total) by mouth as needed for migraine. Take one (1) tablet at onset of headache; if returns or does not resolve, may repeat after 4 hours; do not exceed five (5) mg in 24 hours., Disp: 10 tablet, Rfl: 0   No Known Allergies  Past Medical History:  Diagnosis Date  . Abnormal Pap smear   . Anemia   . Anxiety   . Asthma   . Bipolar 1 disorder ([place])   . Chlamydia 07/12/2012  . Depression   . Family history of adverse reaction to anesthesia   . Headache    [place]  . Hypertension    with pregnancy only  . Opiate addiction ([place])   . Pneumonia 2017   used inhaler   . Polysubstance abuse ([place])   . PONV (postoperative nausea and vomiting)    SEVERE ITCHING AFTER EPIDURAL     Past Surgical History:  Procedure Laterality Date  . CERVICAL CONIZATION W/BX N/A 02/19/2018   Procedure: CONIZATION CERVIX  WITH BIOPSY - COLD KNIFE;  Surgeon: [name], MD;  Location: [facility];  Service: Gynecology;  Laterality: N/A;  . CHOLECYSTECTOMY  11/04/2012   Procedure: LAPAROSCOPIC CHOLECYSTECTOMY WITH INTRAOPERATIVE CHOLANGIOGRAM;  Surgeon: Imogene Burn. Georgette Dover, MD;  Location: [facility];  Service: General;  Laterality: N/A;  . LAPAROSCOPY N/A 02/19/2018   Procedure: LAPAROSCOPY DIAGNOSTIC WITH PERITONEAL BIOPSIES;  Surgeon: [name], MD;  Location: [facility];  Service: Gynecology;  Laterality: N/A;  . TUBAL LIGATION  09/08/2011   Procedure: POST PARTUM TUBAL LIGATION;  Surgeon: Emeterio Reeve, MD;  Location: [facility];  Service: Gynecology;  Laterality: Bilateral;  . vaginal deliveries      ROS  Objective:   Vitals: BP 127/78 (BP Location: Left Arm)   Pulse 80   Temp 98.1 F (36.7 C) (Oral)   Resp 18   SpO2 100%   Physical Exam Constitutional:      General: She is not in acute distress.    Appearance: Normal appearance. She is well-developed and normal weight. She is not ill-appearing, toxic-appearing or diaphoretic.  HENT:     Head: Normocephalic and atraumatic.     Right Ear: External ear normal.     Left Ear: External ear normal.     Nose: Nose normal.     Mouth/Throat:     Mouth: Mucous membranes are moist.     Pharynx: Oropharynx is  clear.  Eyes:     General: No scleral icterus.    Extraocular Movements: Extraocular movements intact.     Pupils: Pupils are equal, round, and reactive to light.  Cardiovascular:     Rate and Rhythm: Normal rate and regular rhythm.     Heart sounds: Normal heart sounds. No murmur. No friction rub. No gallop.   Pulmonary:     Effort: Pulmonary effort is normal. No respiratory distress.     Breath sounds: Normal breath sounds. No stridor. No wheezing, rhonchi or rales.  Abdominal:     General: Bowel sounds are normal. There is no distension.     Palpations: Abdomen is soft. There is no mass.     Tenderness: There is no abdominal tenderness. There is no right  CVA tenderness, left CVA tenderness, guarding or rebound.  Skin:    General: Skin is warm and dry.     Coloration: Skin is not pale.     Findings: No rash.  Neurological:     General: No focal deficit present.     Mental Status: She is alert and oriented to person, place, and time.  Psychiatric:        Mood and Affect: Mood normal.        Behavior: Behavior normal.        Thought Content: Thought content normal.        Judgment: Judgment normal.    Results for orders placed or performed during the hospital encounter of 01/27/19 (from the past 24 hour(s))  Pregnancy, urine POC     Status: None   Collection Time: 01/27/19  8:09 PM  Result Value Ref Range   Preg Test, Ur NEGATIVE NEGATIVE  POCT urinalysis dip (device)     Status: Abnormal   Collection Time: 01/27/19  8:10 PM  Result Value Ref Range   Glucose, UA NEGATIVE NEGATIVE mg/dL   Bilirubin Urine NEGATIVE NEGATIVE   Ketones, ur NEGATIVE NEGATIVE mg/dL   Specific Gravity, Urine 1.015 1.005 - 1.030   Hgb urine dipstick TRACE (A) NEGATIVE   pH 7.5 5.0 - 8.0   Protein, ur NEGATIVE NEGATIVE mg/dL   Urobilinogen, UA 0.2 0.0 - 1.0 mg/dL   Nitrite POSITIVE (A) NEGATIVE   Leukocytes, UA TRACE (A) NEGATIVE   Assessment and Plan :   Cystitis  Dysuria  Start Bactrim, urine culture pending. Counseled patient on potential for adverse effects with medications prescribed today, patient verbalized understanding. ER and return-to-clinic precautions discussed, patient verbalized understanding.    Jaynee Eagles, [place] 01/27/19 2043

## 2019-01-30 ENCOUNTER — Telehealth (HOSPITAL_COMMUNITY): Payer: Self-pay | Admitting: Emergency Medicine

## 2019-01-30 LAB — URINE CULTURE: Culture: >=100000 — AB

## 2019-01-30 NOTE — Telephone Encounter (Signed)
Urine culture was positive for e coli and was given bactrim  at urgent care visit. Pt contacted and made aware, educated on completing antibiotic and to follow up if symptoms are persistent. Verbalized understanding.    

## 2019-02-01 ENCOUNTER — Encounter (HOSPITAL_COMMUNITY): Payer: Self-pay

## 2019-02-01 ENCOUNTER — Emergency Department (HOSPITAL_COMMUNITY): Payer: Medicare Other

## 2019-02-01 ENCOUNTER — Emergency Department (HOSPITAL_COMMUNITY)
Admission: EM | Admit: 2019-02-01 | Discharge: 2019-02-01 | Disposition: A | Discharge status: Home / Self Care | Payer: Medicare Other | Attending: Emergency Medicine | Admitting: Emergency Medicine

## 2019-02-01 DIAGNOSIS — N390 Urinary tract infection, site not specified: Secondary | ICD-10-CM

## 2019-02-01 DIAGNOSIS — I10 Essential (primary) hypertension: Secondary | ICD-10-CM | POA: Diagnosis not present

## 2019-02-01 DIAGNOSIS — J45909 Unspecified asthma, uncomplicated: Secondary | ICD-10-CM | POA: Insufficient documentation

## 2019-02-01 DIAGNOSIS — R109 Unspecified abdominal pain: Secondary | ICD-10-CM

## 2019-02-01 DIAGNOSIS — Z79899 Other long term (current) drug therapy: Secondary | ICD-10-CM | POA: Insufficient documentation

## 2019-02-01 DIAGNOSIS — R531 Weakness: Secondary | ICD-10-CM

## 2019-02-01 DIAGNOSIS — R110 Nausea: Secondary | ICD-10-CM

## 2019-02-01 DIAGNOSIS — F17210 Nicotine dependence, cigarettes, uncomplicated: Secondary | ICD-10-CM | POA: Insufficient documentation

## 2019-02-01 DIAGNOSIS — N200 Calculus of kidney: Secondary | ICD-10-CM | POA: Insufficient documentation

## 2019-02-01 LAB — URINALYSIS, ROUTINE W REFLEX MICROSCOPIC
Bacteria, UA: NONE SEEN
Bilirubin Urine: NEGATIVE
Glucose, UA: NEGATIVE mg/dL
Hgb urine dipstick: NEGATIVE
Ketones, ur: 20 mg/dL — AB
Leukocytes,Ua: NEGATIVE
Nitrite: NEGATIVE
Protein, ur: 30 mg/dL — AB
Specific Gravity, Urine: 1.028 (ref 1.005–1.030)
pH: 5 (ref 5.0–8.0)

## 2019-02-01 LAB — CBC
HCT: 42.3 % (ref 36.0–46.0)
Hemoglobin: 13.1 g/dL (ref 12.0–15.0)
MCH: 27.5 pg (ref 26.0–34.0)
MCHC: 31 g/dL (ref 30.0–36.0)
MCV: 88.9 fL (ref 80.0–100.0)
Platelets: 310 10*3/uL (ref 150–400)
RBC: 4.76 MIL/uL (ref 3.87–5.11)
RDW: 12.8 % (ref 11.5–15.5)
WBC: 5.1 10*3/uL (ref 4.0–10.5)
nRBC: 0 % (ref 0.0–0.2)

## 2019-02-01 LAB — COMPREHENSIVE METABOLIC PANEL
ALBUMIN: 4.2 g/dL (ref 3.5–5.0)
ALT: 13 U/L (ref 0–44)
AST: 17 U/L (ref 15–41)
Alkaline Phosphatase: 53 U/L (ref 38–126)
Anion gap: 10 (ref 5–15)
BUN: 7 mg/dL (ref 6–20)
CO2: 22 mmol/L (ref 22–32)
Calcium: 9.3 mg/dL (ref 8.9–10.3)
Chloride: 100 mmol/L (ref 98–111)
Creatinine, Ser: 0.94 mg/dL (ref 0.44–1.00)
GFR calc Af Amer: >60 mL/min (ref >60)
GFR calc non Af Amer: >60 mL/min (ref >60)
Glucose, Bld: 92 mg/dL (ref 70–99)
Potassium: 3.8 mmol/L (ref 3.5–5.1)
Sodium: 132 mmol/L — ABNORMAL LOW (ref 135–145)
Total Bilirubin: 0.5 mg/dL (ref 0.3–1.2)
Total Protein: 6.9 g/dL (ref 6.5–8.1)

## 2019-02-01 LAB — LIPASE, BLOOD: Lipase: 23 U/L (ref 11–51)

## 2019-02-01 LAB — I-STAT BETA HCG BLOOD, ED (MC, WL, AP ONLY): I-stat hCG, quantitative: <5 m[IU]/mL (ref <5)

## 2019-02-01 MED ORDER — ONDANSETRON HCL 4 MG/2ML IJ SOLN
4.0000 mg | Freq: Once | INTRAMUSCULAR | Status: AC
  Administered 2019-02-01: 4 mg via INTRAVENOUS
  Filled 2019-02-01: qty 2

## 2019-02-01 MED ORDER — KETOROLAC TROMETHAMINE 30 MG/ML IJ SOLN
30.0000 mg | Freq: Once | INTRAMUSCULAR | Status: AC
  Administered 2019-02-01: 30 mg via INTRAVENOUS
  Filled 2019-02-01: qty 1

## 2019-02-01 MED ORDER — ONDANSETRON 4 MG PO TBDP: 4.0000 mg | ORAL_TABLET | Freq: Three times a day (TID) | ORAL | 0 refills | Status: DC | PRN

## 2019-02-01 MED ORDER — SULFAMETHOXAZOLE-TRIMETHOPRIM 800-160 MG PO TABS: 1.0000 | ORAL_TABLET | Freq: Two times a day (BID) | ORAL | 0 refills | Status: DC

## 2019-02-01 MED ORDER — SODIUM CHLORIDE 0.9% FLUSH: 3.0000 mL | Freq: Once | INTRAVENOUS | Status: DC

## 2019-02-01 MED ORDER — SODIUM CHLORIDE 0.9 % IV BOLUS
2000.0000 mL | Freq: Once | INTRAVENOUS | Status: AC
  Administered 2019-02-01: 2000 mL via INTRAVENOUS

## 2019-02-01 NOTE — Discharge Instructions (Addendum)
Stay very well hydrated with plenty of water throughout the day. Finish your antibiotic and then continue taking the antibiotic prescribed today until you're finished. Alternate between tylenol and motrin as needed for pain. Use zofran as directed as needed for nausea. Follow up with your primary care physician in 1 week for recheck of ongoing symptoms but return to ER for emergent changing or worsening of symptoms. Please seek immediate care if you develop the following: You develop back pain.  Your symptoms are no better, or worse in 3 days. There is severe back pain or lower abdominal pain.  You develop chills.  You have a fever.  There is nausea or vomiting.  There is continued burning or discomfort with urination.
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
## 2019-02-01 NOTE — ED Triage Notes (Signed)
Pt reports generalized fatigue and nausea for the past week, pt was seen at [facility] and given abx for a UTI but pt is feeling worse now, having left sided flank pain that radiates to her back.

## 2019-03-30 ENCOUNTER — Emergency Department (HOSPITAL_COMMUNITY): Payer: Medicare Other

## 2019-03-30 ENCOUNTER — Encounter (HOSPITAL_COMMUNITY): Payer: Self-pay | Admitting: Emergency Medicine

## 2019-03-30 ENCOUNTER — Telehealth: Payer: Self-pay | Admitting: Family Medicine

## 2019-03-30 ENCOUNTER — Emergency Department (HOSPITAL_COMMUNITY)
Admission: EM | Admit: 2019-03-30 | Discharge: 2019-03-30 | Disposition: A | Discharge status: Home / Self Care | Payer: Medicare Other | Attending: Emergency Medicine | Admitting: Emergency Medicine

## 2019-03-30 DIAGNOSIS — Z79899 Other long term (current) drug therapy: Secondary | ICD-10-CM | POA: Insufficient documentation

## 2019-03-30 DIAGNOSIS — R0789 Other chest pain: Secondary | ICD-10-CM | POA: Insufficient documentation

## 2019-03-30 DIAGNOSIS — F17210 Nicotine dependence, cigarettes, uncomplicated: Secondary | ICD-10-CM | POA: Diagnosis not present

## 2019-03-30 DIAGNOSIS — J45909 Unspecified asthma, uncomplicated: Secondary | ICD-10-CM | POA: Insufficient documentation

## 2019-03-30 LAB — CBC WITH DIFFERENTIAL/PLATELET
Abs Immature Granulocytes: 0.01 10*3/uL (ref 0.00–0.07)
Basophils Absolute: 0 10*3/uL (ref 0.0–0.1)
Basophils Relative: 1 %
Eosinophils Absolute: 0.3 10*3/uL (ref 0.0–0.5)
Eosinophils Relative: 5 %
HCT: 38.1 % (ref 36.0–46.0)
Hemoglobin: 11.9 g/dL — ABNORMAL LOW (ref 12.0–15.0)
Immature Granulocytes: 0 %
Lymphocytes Relative: 43 %
Lymphs Abs: 2.9 10*3/uL (ref 0.7–4.0)
MCH: 27.9 pg (ref 26.0–34.0)
MCHC: 31.2 g/dL (ref 30.0–36.0)
MCV: 89.4 fL (ref 80.0–100.0)
Monocytes Absolute: 0.4 10*3/uL (ref 0.1–1.0)
Monocytes Relative: 6 %
Neutro Abs: 3 10*3/uL (ref 1.7–7.7)
Neutrophils Relative %: 45 %
Platelets: 280 10*3/uL (ref 150–400)
RBC: 4.26 MIL/uL (ref 3.87–5.11)
RDW: 13 % (ref 11.5–15.5)
WBC: 6.7 10*3/uL (ref 4.0–10.5)
nRBC: 0 % (ref 0.0–0.2)

## 2019-03-30 LAB — BASIC METABOLIC PANEL
Anion gap: 14 (ref 5–15)
BUN: 10 mg/dL (ref 6–20)
CO2: 24 mmol/L (ref 22–32)
Calcium: 9.8 mg/dL (ref 8.9–10.3)
Chloride: 103 mmol/L (ref 98–111)
Creatinine, Ser: 0.66 mg/dL (ref 0.44–1.00)
GFR calc Af Amer: >60 mL/min (ref >60)
GFR calc non Af Amer: >60 mL/min (ref >60)
Glucose, Bld: 100 mg/dL — ABNORMAL HIGH (ref 70–99)
Potassium: 3.3 mmol/L — ABNORMAL LOW (ref 3.5–5.1)
Sodium: 141 mmol/L (ref 135–145)

## 2019-03-30 LAB — TROPONIN I: Troponin I: <0.03 ng/mL (ref <0.03)

## 2019-03-30 MED ORDER — IBUPROFEN 800 MG PO TABS: 800.0000 mg | ORAL_TABLET | Freq: Three times a day (TID) | ORAL | 0 refills | Status: DC

## 2019-03-30 MED ORDER — IBUPROFEN 800 MG PO TABS
800.0000 mg | ORAL_TABLET | Freq: Once | ORAL | Status: AC
  Administered 2019-03-30: 800 mg via ORAL
  Filled 2019-03-30: qty 1

## 2019-03-30 MED ORDER — METHOCARBAMOL 500 MG PO TABS: 500.0000 mg | ORAL_TABLET | Freq: Two times a day (BID) | ORAL | 0 refills | Status: DC

## 2019-03-30 NOTE — Telephone Encounter (Signed)
Patient calls me after our emergency pager.  Return her phone call and she sounds short of breath over the phone.  She says that for the last 4 days she has had a consistent chest tightness.  She says it is a sharp pain that sometimes radiates to her left side.  She says that she has not had pain like this before she is not known any specific trigger for this.  Other than a mild runny nose last week she is not had any sick symptoms.  She said she is felt more tired since this started has not been coughing or vomiting, she says it hurts worse when she leans over.  She says this does not feel like prior asthma issues that she has had.  We discussed that there are a number of potentially serious issues that can cause symptoms like this as well as minor issues that might not need emergency department care.  Given the inability to rule out significantly dangerous causes for this type of symptom over the phone she is advised to present to the emergency department for basic evaluation.  Patient agrees and will have husband drive her  Dr. Criss Rosales

## 2019-03-30 NOTE — Discharge Instructions (Signed)
Take the prescribed medication as directed. Follow-up with your primary care doctor.  You have them review labs/imaging studies-- attached on back for you. Return to the ED for new or worsening symptoms.

## 2019-03-30 NOTE — ED Notes (Signed)
ED Provider at bedside. 
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
## 2019-03-30 NOTE — ED Notes (Signed)
Patient verbalized understanding of dc instructions, vss, ambulatory with nad.   

## 2019-03-30 NOTE — ED Triage Notes (Signed)
Patient reports central chest pressure for "days" she states that pain is worse when she tries to lifts, pushes or pulls something. She endorses "slight" cough but denies fever. resp e/u, nad.

## 2019-05-15 ENCOUNTER — Telehealth (INDEPENDENT_AMBULATORY_CARE_PROVIDER_SITE_OTHER): Payer: Medicare Other | Admitting: Family Medicine

## 2019-05-15 ENCOUNTER — Other Ambulatory Visit: Payer: Self-pay

## 2019-05-15 DIAGNOSIS — N390 Urinary tract infection, site not specified: Secondary | ICD-10-CM

## 2019-05-15 MED ORDER — FLUCONAZOLE 150 MG PO TABS: 150.0000 mg | ORAL_TABLET | ORAL | 0 refills | Status: DC

## 2019-05-15 MED ORDER — SULFAMETHOXAZOLE-TRIMETHOPRIM 800-160 MG PO TABS: 1.0000 | ORAL_TABLET | Freq: Two times a day (BID) | ORAL | 0 refills | Status: AC

## 2019-05-15 NOTE — Progress Notes (Signed)
[facility] Telemedicine Visit  Patient consented to have virtual visit. Method of visit: Video was attempted, but technology challenges prevented patient from using video, so visit was conducted via telephone.  Encounter participants: Patient: Rhonda Cabrera - located at home Provider: Lovenia Kim - located at office Others (if applicable): N/A   Chief Complaint: concern for UTI, work note  HPI:  C/o dysuria x 2 weeks, associated with increased frequency without urgency.  Urine is cloudy and has an odor to it. She has h/o recurrent UTI and symptoms feel similar to these episodes. Previously has been treated with Bactrim.   She has been referred to a urologist for this however has not been seen yet due to the coronavirus pandemic. She states she is certain her symptoms are not related to an STD.  No abdominal pain, nausea or vomiting.  She has some back pain however denies flank pain, fever, chills.  She also requests diflucan for yeast infections she usually gets following a course of antibiotics.   Of note, she has been exposed to someone who works with an individual who may have COVID-19 and has a pending test so she did not come in to our clinic today.  She has been quarantining herself and requests a note for her work to resume once she hears about that individual's test result.  She reports she is afraid [place] into work until she knows that they are negative. She is currently asymptomatic and denies fever, chills, nausea, vomiting, sore throat and cough.  She denies loss of sense of smell or taste. No CP or SOB reported.   ROS: per HPI  Pertinent PMHx: asthma, h/o recurrent UTI   Exam:  General: 33 yo female, NAD  Respiratory: comfortable work of breathing, speaking in full sentences  Psych: anxious  Assessment/Plan:  Dysuria Clinically c/w UTI based on history provided.  H/o recurrent UTI with good response to Bactrim.  Mild back pain without systemic  symptoms to suggest pyelonephritis.  Will refill antibiotics, advised to increase water intake and may try Azo.   -Rx: Bactrim x5 days  -Rx: diflucan as requested in case develops a yeast infection following abx -Work note provided, patient is asymptomatic and does not have direct contact with possibly infected individual.  I have indicated that she will follow up with her job once results are available.  -Follow up if symptoms worsen or do not improve   Time spent during visit with patient: 15 minutes

## 2019-05-15 NOTE — Patient Instructions (Signed)
Urinary Tract Infection, Adult A urinary tract infection (UTI) is an infection of any part of the urinary tract. The urinary tract includes:  The kidneys.  The ureters.  The bladder.  The urethra. These organs make, store, and get rid of pee (urine) in the body. What are the causes? This is caused by germs (bacteria) in your genital area. These germs grow and cause swelling (inflammation) of your urinary tract. What increases the risk? You are more likely to develop this condition if:  You have a small, thin tube (catheter) to drain pee.  You cannot control when you pee or poop (incontinence).  You are female, and: ? You use these methods to prevent pregnancy: ? A medicine that kills sperm (spermicide). ? A device that blocks sperm (diaphragm). ? You have low levels of a female hormone (estrogen). ? You are pregnant.  You have genes that add to your risk.  You are sexually active.  You take antibiotic medicines.  You have trouble peeing because of: ? A prostate that is bigger than normal, if you are female. ? A blockage in the part of your body that drains pee from the bladder (urethra). ? A kidney stone. ? A nerve condition that affects your bladder (neurogenic bladder). ? Not getting enough to drink. ? Not peeing often enough.  You have other conditions, such as: ? Diabetes. ? A weak disease-fighting system (immune system). ? Sickle cell disease. ? Gout. ? Injury of the spine. What are the signs or symptoms? Symptoms of this condition include:  Needing to pee right away (urgently).  Peeing often.  Peeing small amounts often.  Pain or burning when peeing.  Blood in the pee.  Pee that smells bad or not like normal.  Trouble peeing.  Pee that is cloudy.  Fluid coming from the vagina, if you are female.  Pain in the belly or lower back. Other symptoms include:  Throwing up (vomiting).  No urge to eat.  Feeling mixed up (confused).  Being tired  and grouchy (irritable).  A fever.  Watery poop (diarrhea). How is this treated? This condition may be treated with:  Antibiotic medicine.  Other medicines.  Drinking enough water. Follow these instructions at home:  Medicines  Take over-the-counter and prescription medicines only as told by your doctor.  If you were prescribed an antibiotic medicine, take it as told by your doctor. Do not stop taking it even if you start to feel better. General instructions  Make sure you: ? Pee until your bladder is empty. ? Do not hold pee for a long time. ? Empty your bladder after sex. ? Wipe from front to back after pooping if you are a female. Use each tissue one time when you wipe.  Drink enough fluid to keep your pee pale yellow.  Keep all follow-up visits as told by your doctor. This is important. Contact a doctor if:  You do not get better after 1-2 days.  Your symptoms go away and then come back. Get help right away if:  You have very bad back pain.  You have very bad pain in your lower belly.  You have a fever.  You are sick to your stomach (nauseous).  You are throwing up. Summary  A urinary tract infection (UTI) is an infection of any part of the urinary tract.  This condition is caused by germs in your genital area.  There are many risk factors for a UTI. These include having a small, thin   tube to drain pee and not being able to control when you pee or poop.  Treatment includes antibiotic medicines for germs.  Drink enough fluid to keep your pee pale yellow. This information is not intended to replace advice given to you by your health care provider. Make sure you discuss any questions you have with your health care provider. Document Released: 05/22/2008 Document Revised: 06/13/2018 Document Reviewed: 06/13/2018 Elsevier Interactive Patient Education  2019 [employer]  

## 2019-05-23 ENCOUNTER — Emergency Department (HOSPITAL_COMMUNITY): Payer: Medicare Other

## 2019-05-23 ENCOUNTER — Encounter (HOSPITAL_COMMUNITY): Payer: Self-pay | Admitting: Emergency Medicine

## 2019-05-23 ENCOUNTER — Emergency Department (HOSPITAL_COMMUNITY)
Admission: EM | Admit: 2019-05-23 | Discharge: 2019-05-23 | Disposition: A | Discharge status: Home / Self Care | Payer: Medicare Other | Attending: Emergency Medicine | Admitting: Emergency Medicine

## 2019-05-23 ENCOUNTER — Other Ambulatory Visit: Payer: Self-pay

## 2019-05-23 DIAGNOSIS — Z79899 Other long term (current) drug therapy: Secondary | ICD-10-CM | POA: Insufficient documentation

## 2019-05-23 DIAGNOSIS — I10 Essential (primary) hypertension: Secondary | ICD-10-CM | POA: Insufficient documentation

## 2019-05-23 DIAGNOSIS — R0789 Other chest pain: Secondary | ICD-10-CM | POA: Insufficient documentation

## 2019-05-23 DIAGNOSIS — F17210 Nicotine dependence, cigarettes, uncomplicated: Secondary | ICD-10-CM | POA: Insufficient documentation

## 2019-05-23 DIAGNOSIS — R002 Palpitations: Secondary | ICD-10-CM | POA: Insufficient documentation

## 2019-05-23 DIAGNOSIS — F1210 Cannabis abuse, uncomplicated: Secondary | ICD-10-CM | POA: Insufficient documentation

## 2019-05-23 DIAGNOSIS — F1110 Opioid abuse, uncomplicated: Secondary | ICD-10-CM | POA: Diagnosis not present

## 2019-05-23 DIAGNOSIS — R079 Chest pain, unspecified: Secondary | ICD-10-CM | POA: Diagnosis present

## 2019-05-23 LAB — COMPREHENSIVE METABOLIC PANEL
ALT: 11 U/L (ref 0–44)
AST: 14 U/L — ABNORMAL LOW (ref 15–41)
Albumin: 4.1 g/dL (ref 3.5–5.0)
Alkaline Phosphatase: 49 U/L (ref 38–126)
Anion gap: 10 (ref 5–15)
BUN: 10 mg/dL (ref 6–20)
CO2: 23 mmol/L (ref 22–32)
Calcium: 9.4 mg/dL (ref 8.9–10.3)
Chloride: 105 mmol/L (ref 98–111)
Creatinine, Ser: 0.73 mg/dL (ref 0.44–1.00)
GFR calc Af Amer: >60 mL/min (ref >60)
GFR calc non Af Amer: >60 mL/min (ref >60)
Glucose, Bld: 96 mg/dL (ref 70–99)
Potassium: 3.7 mmol/L (ref 3.5–5.1)
Sodium: 138 mmol/L (ref 135–145)
Total Bilirubin: 0.5 mg/dL (ref 0.3–1.2)
Total Protein: 6.7 g/dL (ref 6.5–8.1)

## 2019-05-23 LAB — CBC
HCT: 39.1 % (ref 36.0–46.0)
Hemoglobin: 12.5 g/dL (ref 12.0–15.0)
MCH: 28.7 pg (ref 26.0–34.0)
MCHC: 32 g/dL (ref 30.0–36.0)
MCV: 89.7 fL (ref 80.0–100.0)
Platelets: 261 10*3/uL (ref 150–400)
RBC: 4.36 MIL/uL (ref 3.87–5.11)
RDW: 12.7 % (ref 11.5–15.5)
WBC: 3.7 10*3/uL — ABNORMAL LOW (ref 4.0–10.5)
nRBC: 0 % (ref 0.0–0.2)

## 2019-05-23 LAB — TROPONIN I: Troponin I: <0.03 ng/mL (ref <0.03)

## 2019-05-23 NOTE — ED Triage Notes (Signed)
Pt in with c/o chest tightness x 1 wk, states the pain became sharper and intermittent this am. Denies any n/v, cough or sob
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
## 2019-05-23 NOTE — ED Notes (Signed)
Chest pain "for quite sometime" comes and goes-- started this am, directly in middle of chest -- makes it difficult to breathe.  Told to stop taking seraquel by psychologist-- cold [place]- 2 weeks ago, thought that the medicine was making heart race.

## 2019-05-23 NOTE — Discharge Instructions (Signed)

## 2019-05-28 ENCOUNTER — Emergency Department (HOSPITAL_COMMUNITY): Payer: Medicare Other

## 2019-05-28 ENCOUNTER — Emergency Department (HOSPITAL_COMMUNITY)
Admission: EM | Admit: 2019-05-28 | Discharge: 2019-05-28 | Disposition: A | Discharge status: Home / Self Care | Payer: Medicare Other | Attending: Emergency Medicine | Admitting: Emergency Medicine

## 2019-05-28 ENCOUNTER — Other Ambulatory Visit: Payer: Self-pay

## 2019-05-28 ENCOUNTER — Encounter (HOSPITAL_COMMUNITY): Payer: Self-pay

## 2019-05-28 DIAGNOSIS — J45909 Unspecified asthma, uncomplicated: Secondary | ICD-10-CM | POA: Insufficient documentation

## 2019-05-28 DIAGNOSIS — I10 Essential (primary) hypertension: Secondary | ICD-10-CM | POA: Insufficient documentation

## 2019-05-28 DIAGNOSIS — R072 Precordial pain: Secondary | ICD-10-CM | POA: Diagnosis not present

## 2019-05-28 DIAGNOSIS — R079 Chest pain, unspecified: Secondary | ICD-10-CM | POA: Diagnosis present

## 2019-05-28 DIAGNOSIS — F17210 Nicotine dependence, cigarettes, uncomplicated: Secondary | ICD-10-CM | POA: Insufficient documentation

## 2019-05-28 LAB — CBC
HCT: 39 % (ref 36.0–46.0)
Hemoglobin: 12.5 g/dL (ref 12.0–15.0)
MCH: 28.6 pg (ref 26.0–34.0)
MCHC: 32.1 g/dL (ref 30.0–36.0)
MCV: 89.2 fL (ref 80.0–100.0)
Platelets: 262 10*3/uL (ref 150–400)
RBC: 4.37 MIL/uL (ref 3.87–5.11)
RDW: 12.4 % (ref 11.5–15.5)
WBC: 3.9 10*3/uL — ABNORMAL LOW (ref 4.0–10.5)
nRBC: 0 % (ref 0.0–0.2)

## 2019-05-28 LAB — I-STAT BETA HCG BLOOD, ED (MC, WL, AP ONLY): I-stat hCG, quantitative: <5 m[IU]/mL (ref <5)

## 2019-05-28 LAB — BASIC METABOLIC PANEL
Anion gap: 10 (ref 5–15)
BUN: 8 mg/dL (ref 6–20)
CO2: 22 mmol/L (ref 22–32)
Calcium: 9 mg/dL (ref 8.9–10.3)
Chloride: 106 mmol/L (ref 98–111)
Creatinine, Ser: 0.77 mg/dL (ref 0.44–1.00)
GFR calc Af Amer: >60 mL/min (ref >60)
GFR calc non Af Amer: >60 mL/min (ref >60)
Glucose, Bld: 106 mg/dL — ABNORMAL HIGH (ref 70–99)
Potassium: 3.8 mmol/L (ref 3.5–5.1)
Sodium: 138 mmol/L (ref 135–145)

## 2019-05-28 LAB — TROPONIN I
Troponin I: <0.03 ng/mL (ref <0.03)
Troponin I: <0.03 ng/mL (ref <0.03)

## 2019-05-28 LAB — D-DIMER, QUANTITATIVE: D-Dimer, Quant: 0.29 ug/mL-FEU (ref 0.00–0.50)

## 2019-05-28 MED ORDER — SODIUM CHLORIDE 0.9% FLUSH: 3.0000 mL | Freq: Once | INTRAVENOUS | Status: DC

## 2019-05-28 NOTE — ED Triage Notes (Signed)
To triage via EMS.  Pt has been having left sided chest pain radiating to left flank x 1 month.  Pain started after psychiatrist took pt off Seroquel d/t side effect of "fast heart beat".   EMS BP 125/85 Hr 89 RR 18
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
## 2019-05-28 NOTE — Discharge Instructions (Signed)

## 2019-05-29 ENCOUNTER — Ambulatory Visit: Payer: Medicare Other

## 2019-05-30 ENCOUNTER — Telehealth: Payer: Self-pay | Admitting: Cardiology

## 2019-06-02 ENCOUNTER — Telehealth: Payer: Self-pay | Admitting: Family Medicine

## 2019-06-02 ENCOUNTER — Ambulatory Visit (INDEPENDENT_AMBULATORY_CARE_PROVIDER_SITE_OTHER): Payer: Medicare Other | Admitting: Student in an Organized Health Care Education/Training Program

## 2019-06-02 ENCOUNTER — Encounter: Payer: Self-pay | Admitting: Student in an Organized Health Care Education/Training Program

## 2019-06-02 ENCOUNTER — Other Ambulatory Visit: Payer: Self-pay

## 2019-06-02 DIAGNOSIS — Z20822 Contact with and (suspected) exposure to covid-19: Secondary | ICD-10-CM

## 2019-06-02 DIAGNOSIS — Z20828 Contact with and (suspected) exposure to other viral communicable diseases: Secondary | ICD-10-CM | POA: Diagnosis not present

## 2019-06-02 NOTE — Progress Notes (Signed)
[facility] Telemedicine Visit  Patient consented to have virtual visit. Method of visit: Video was attempted, but technology challenges prevented patient from using video, so visit was conducted via telephone.  Encounter participants: Patient: Rhonda Cabrera - located at Home Provider: Everrett Coombe - located at [facility] Others (if applicable): None  Chief Complaint: chills and muscle aches  HPI: Patient went to the emergency department by ambulance last week.  She was having precordial chest pain.  She underwent work-up for chest pain.  However she was waiting for work-up in close proximity with a gentleman who is coughing.  She reports that he was not wearing a mask and neither was she.  She is extremely concerned that she was exposed to [place].  Today, patient reports that she has developed chills and muscle pain.  She does not have a thermometer at home to check her temperature.  She is not having any sore throat or shortness of breath.  Patient does have a history of asthma.  Additionally she notes that she has a son with severe asthma requiring advanced therapy including injections.  She is very concerned about potentially exposing her son to COVID-19.  COVID Drive-Up Test Referral Criteria  Patient age: 33 y.o.  Symptoms: Chills and Muscle pain starting yesterday evening.  Last night  6/10  Underlying Conditions: No underlying conditions from the drop down list - patient does have a history of asthma.  Is the patient a first responder? No  Does the patient live or work in a high risk or high density environment: No  Is the patient a COVID convalescent patient who is 14-28 days symptom-free and interested in donating plasma for use as a therapeutic product? No  ROS: per HPI  Pertinent PMHx: Asthma  Exam:  Respiratory: Speaks in full sentences, no increased work of breathing over the phone.  Assessment/Plan:  Exposure to Covid-19 Virus Patient  is very nervous about the possibility of exposing her son to [place]. She reports that is vulnerable due to severe asthma. She has mild symptoms. We will refer for COVID-19 testing.  - patient to call back with work fax # so we can send work note   Time spent during visit with patient: 12 minutes

## 2019-06-02 NOTE — Telephone Encounter (Signed)
**  After Hours/ Emergency Line Call*  Received a call to report that Rhonda Cabrera is having body aches, chills. She was in the ED recently for about 9 hours in the waiting room and is afraid she was exposed to Covid, there was an ill appearing gentleman coughing in the ED near her.  Endorsing headache.  Denying shortness of breath/difficulty breathing.  Recommended that she have virtual visit with PCP tomorrow to discuss further, patient wants to get testing done. She is self isolating from family.  Red flags discussed.  Will forward to PCP.  Steve Rattler, DO PGY-3, [facility]

## 2019-06-02 NOTE — Telephone Encounter (Signed)
iphone/ consent/ my chart/ pre reg completed °

## 2019-06-03 ENCOUNTER — Telehealth: Payer: Self-pay | Admitting: Student in an Organized Health Care Education/Training Program

## 2019-06-03 ENCOUNTER — Encounter: Payer: Self-pay | Admitting: Student in an Organized Health Care Education/Training Program

## 2019-06-03 ENCOUNTER — Other Ambulatory Visit: Payer: Self-pay

## 2019-06-03 ENCOUNTER — Telehealth: Payer: Self-pay

## 2019-06-03 DIAGNOSIS — Z20822 Contact with and (suspected) exposure to covid-19: Secondary | ICD-10-CM

## 2019-06-03 DIAGNOSIS — Z20828 Contact with and (suspected) exposure to other viral communicable diseases: Secondary | ICD-10-CM | POA: Insufficient documentation

## 2019-06-03 NOTE — Assessment & Plan Note (Signed)
Patient is very nervous about the possibility of exposing her son to [place]. She reports that is vulnerable due to severe asthma. She has mild symptoms. We will refer for COVID-19 testing.  - patient to call back with work fax # so we can send work note

## 2019-06-03 NOTE — Telephone Encounter (Signed)
Scheduled for Covid testing today at [facility] at 3:15. Instructions given to patient. Orders placed

## 2019-06-03 NOTE — Telephone Encounter (Signed)
LVM for pt to call and give [place] the fax number. If pt calls, please send me fax number or fax letter pt has requested. Ottis Stain, CMA

## 2019-06-03 NOTE — Telephone Encounter (Signed)
-----   Message from Everrett Coombe, MD sent at 06/03/2019  9:03 AM EDT ----- Regarding: Need work fax # I had a video visit with this patient yesterday and she wanted me to fax a work note to her work. She called back with the fax# which was on a post-it and yesterday afternoon I could not find it anywhere. Can you call and ask her for the fax # to send the work note?

## 2019-06-04 ENCOUNTER — Telehealth (INDEPENDENT_AMBULATORY_CARE_PROVIDER_SITE_OTHER): Payer: Self-pay | Admitting: Family Medicine

## 2019-06-04 ENCOUNTER — Other Ambulatory Visit: Payer: Self-pay

## 2019-06-04 DIAGNOSIS — Z5329 Procedure and treatment not carried out because of patient's decision for other reasons: Secondary | ICD-10-CM

## 2019-06-04 DIAGNOSIS — Z91199 Patient's noncompliance with other medical treatment and regimen due to unspecified reason: Secondary | ICD-10-CM

## 2019-06-04 NOTE — Progress Notes (Signed)
I Attempted 3 reminder calls before appt time. pts appt is at 10:50am. Salvatore Marvel, CMA

## 2019-06-05 ENCOUNTER — Telehealth (INDEPENDENT_AMBULATORY_CARE_PROVIDER_SITE_OTHER): Payer: Medicare Other | Admitting: Cardiology

## 2019-06-05 ENCOUNTER — Encounter: Payer: Self-pay | Admitting: Cardiology

## 2019-06-05 ENCOUNTER — Telehealth: Payer: Self-pay | Admitting: Radiology

## 2019-06-05 ENCOUNTER — Encounter: Payer: Self-pay | Admitting: Student in an Organized Health Care Education/Training Program

## 2019-06-05 VITALS — HR 50 | Ht 67.0 in | Wt 160.0 lb

## 2019-06-05 DIAGNOSIS — R002 Palpitations: Secondary | ICD-10-CM

## 2019-06-05 DIAGNOSIS — Z7189 Other specified counseling: Secondary | ICD-10-CM

## 2019-06-05 DIAGNOSIS — Z716 Tobacco abuse counseling: Secondary | ICD-10-CM

## 2019-06-05 DIAGNOSIS — Z01812 Encounter for preprocedural laboratory examination: Secondary | ICD-10-CM

## 2019-06-05 DIAGNOSIS — F17210 Nicotine dependence, cigarettes, uncomplicated: Secondary | ICD-10-CM | POA: Diagnosis not present

## 2019-06-05 DIAGNOSIS — F17200 Nicotine dependence, unspecified, uncomplicated: Secondary | ICD-10-CM

## 2019-06-05 DIAGNOSIS — Z8249 Family history of ischemic heart disease and other diseases of the circulatory system: Secondary | ICD-10-CM

## 2019-06-05 DIAGNOSIS — R072 Precordial pain: Secondary | ICD-10-CM | POA: Diagnosis not present

## 2019-06-05 LAB — NOVEL CORONAVIRUS, NAA: SARS-CoV-2, NAA: NOT DETECTED

## 2019-06-05 NOTE — Progress Notes (Signed)
Virtual Visit via Video Note   This visit type was conducted due to national recommendations for restrictions regarding the COVID-19 Pandemic (e.g. social distancing) in an effort to limit this patient's exposure and mitigate transmission in our community.  Due to her co-morbid illnesses, this patient is at least at moderate risk for complications without adequate follow up.  This format is felt to be most appropriate for this patient at this time.  All issues noted in this document were discussed and addressed.  A limited physical exam was performed with this format.  Please refer to the patient's chart for her consent to telehealth for [facility].   Date:  06/05/2019   ID:  Rhonda Cabrera, DOB [birth year], MRN [id number]  Patient Location: Home Provider Location: Home  PCP:  Everrett Coombe, MD  Cardiologist:  Buford Dresser, MD  Electrophysiologist:  None   Evaluation Performed:  Consultation - Wilkie Aye was referred by Dr. Burr Medico for the evaluation of chest pain.  Chief Complaint:  Chest pain/palpitations  History of Present Illness:    Rhonda Cabrera is a 33 y.o. female with PMH anxiety/depression, recovering from opiate dependence on suboxone, tobacco dependence who is seen as a new consult at the requenst of Dr. Everrett Coombe for chest pain and palpitations  The patient does not have symptoms concerning for COVID-19 infection (fever, chills, cough, or new shortness of breath). She is concerned about recent ER visit/potential exposure and was set up for ambulatory testing by her PCP  Chest pain/palpitations: -Initial onset: several months ago. Initially diagnosed with chest wall pain in ER at that time, but symptoms have persisted since then. -Quality: sharp/throbbing pain in central chest and left side, sometimes moves to arm. Heart racing has been concerning to her, sometimes wakes up with heart racing. Sometimes pain is related to palpitations on occasion  but sometimes they aren't -Frequency: for palpitations, random, but thinks it averages 5-6 times/week. For chest pain, has occurred 3 severe times in the last 3 months but more mild pain intermittently. -Duration: 1-2 minutes -Associated symptoms: none -Aggravating/alleviating factors: worse when she smokes -Prior cardiac history: none, though was in the hospital with pneumonia (in the heart and vascular unit) in the past -Prior ECG: NSR -Prior workup: ER visits with normal ECG and normal troponins -Prior treatment: none -Alcohol: occasional but rarely -Tobacco: current smoker, trying to cut back as the pain is worse when she smokes. Has talked to PCP about smoking aids -History: History of opiate dependence after tooth pain, recovered, was on methadone for 4 years, was told she had an irregular beat. Now on suboxone -Comorbidities: has a history of anxiety and panic attacks. No formal diagnosis of hypertension or diabetes, though reports a prior history of elevated sugar around the time of surgery. Also reports prior episode of high blood pressure, was on diuretic briefly and then stopped.  -Cardiac ROS: no shortness of breath, no PND, no orthopnea, no LE edema, no syncope -Family history: aunt had sleep apnea. Dad has heart issues. Grandmother had a heart attack and a stroke.  Non-cardiac: found a lump in her breast, isn't sure if that's related, planning to follow up with PCP. Feels like a knot.   Past Medical History:  Diagnosis Date  . Abnormal Pap smear   . Anemia   . Anxiety   . Asthma   . Bipolar 1 disorder ([place])   . Chlamydia 07/12/2012  . Depression   . Family history of adverse reaction  to anesthesia   . Headache    [place]  . Hypertension    with pregnancy only  . Opiate addiction ([place])   . Pneumonia 2017   used inhaler   . Polysubstance abuse ([place])   . PONV (postoperative nausea and vomiting)    SEVERE ITCHING AFTER EPIDURAL   Past Surgical History:  Procedure  Laterality Date  . CERVICAL CONIZATION W/BX N/A 02/19/2018   Procedure: CONIZATION CERVIX WITH BIOPSY - COLD KNIFE;  Surgeon: [name], MD;  Location: [facility];  Service: Gynecology;  Laterality: N/A;  . CHOLECYSTECTOMY  11/04/2012   Procedure: LAPAROSCOPIC CHOLECYSTECTOMY WITH INTRAOPERATIVE CHOLANGIOGRAM;  Surgeon: Imogene Burn. Georgette Dover, MD;  Location: [facility];  Service: General;  Laterality: N/A;  . LAPAROSCOPY N/A 02/19/2018   Procedure: LAPAROSCOPY DIAGNOSTIC WITH PERITONEAL BIOPSIES;  Surgeon: [name], MD;  Location: [facility];  Service: Gynecology;  Laterality: N/A;  . TUBAL LIGATION  09/08/2011   Procedure: POST PARTUM TUBAL LIGATION;  Surgeon: Emeterio Reeve, MD;  Location: [facility];  Service: Gynecology;  Laterality: Bilateral;  . vaginal deliveries       Current Meds  Medication Sig  . ibuprofen (ADVIL,MOTRIN) 800 MG tablet Take 1 tablet (800 mg total) by mouth 3 (three) times daily. (Patient taking differently: Take 800 mg by mouth 3 (three) times daily as needed for headache or mild pain. )  . Multiple Vitamins-Calcium (ONE-A-DAY WOMENS FORMULA) TABS Take 1 tablet by mouth daily with breakfast.  . naproxen sodium (ALEVE) 220 MG tablet Take 220-440 mg by mouth 2 (two) times daily as needed (for pain or headaches).  . ondansetron (ZOFRAN ODT) 4 MG disintegrating tablet Take 1 tablet (4 mg total) by mouth every 8 (eight) hours as needed for nausea or vomiting.  . [DISCONTINUED] Ascorbic Acid (VITAMIN C) 500 MG CHEW Chew 500-1,000 mg by mouth 3 (three) times a week.  . [DISCONTINUED] Buprenorphine HCl-Naloxone HCl (SUBOXONE) 8-2 MG FILM Place 1 Film under the tongue 2 (two) times daily.      Allergies:   Seroquel xr [quetiapine fumarate er]   Social History   Tobacco Use  . Smoking status: Current Every Day Smoker    Packs/day: 0.50    Years: 12.00    Pack years: 6.00    Types: Cigarettes  . Smokeless tobacco: Never Used  . Tobacco comment: vaped for 1 month  Substance Use Topics  .  Alcohol use: No    Alcohol/week: 0.0 standard drinks  . Drug use: Yes    Frequency: 7.0 times per week    Types: Oxycodone, Marijuana    Comment:  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     Family Hx: The patient's family history includes Diabetes in her maternal grandmother, paternal grandfather, and paternal grandmother; Drug abuse in her father and mother; Hypertension in her maternal grandmother.  ROS:   Please see the history of present illness.    Constitutional: Negative for chills, fever, night sweats, unintentional weight loss  HENT: Negative for ear pain and hearing loss.   Eyes: Negative for loss of vision and eye pain.  Respiratory: Negative for cough, sputum, wheezing.   Cardiovascular: See HPI. Gastrointestinal: Negative for abdominal pain, melena, and hematochezia.  Genitourinary: Negative for dysuria and hematuria.  Musculoskeletal: Negative for falls and myalgias.  Skin: Negative for itching and rash.  Neurological: Negative for focal weakness, focal sensory changes and loss of consciousness.  Endo/Heme/Allergies: Does not bruise/bleed easily.  All other systems reviewed and are negative.  Prior CV studies:   The following studies were reviewed today: No prior cardiac eval  Labs/Other Tests and Data Reviewed:    EKG:  An ECG dated 05/28/19 was personally reviewed today and demonstrated:  SR/SA  Recent Labs: 05/23/2019: ALT 11 05/28/2019: BUN 8; Creatinine, Ser 0.77; Hemoglobin 12.5; Platelets 262; Potassium 3.8; Sodium 138   Recent Lipid Panel No results found for: CHOL, TRIG, HDL, CHOLHDL, LDLCALC, LDLDIRECT  Wt Readings from Last 3 Encounters:  06/05/19 160 lb (72.6 kg)  05/23/19 153 lb (69.4 kg)  12/31/18 153 lb (69.4 kg)     Objective:    Vital Signs:  Pulse (!) 50 Comment: pt calculated radially  Ht 5\' 7"  (1.702 m)   Wt 160 lb (72.6 kg)   BMI 25.06 kg/m    VITAL SIGNS:  reviewed GEN:  no acute distress EYES:  sclerae anicteric, EOMI -  Extraocular Movements Intact RESPIRATORY:  normal respiratory effort, symmetric expansion CARDIOVASCULAR:  no peripheral edema SKIN:  no rash, lesions or ulcers. MUSCULOSKELETAL:  no obvious deformities. NEURO:  alert and oriented x 3, no obvious focal deficit PSYCH:  normal affect  ASSESSMENT & PLAN:    Chest pain: previously documented as tender to palpation, which is atypical for cardiac pain, but does have risk factors including family history, tobacco use. Possibly distant hyperglycemia/elevated BP but denies formal diabetes/hypertension diagnosis. -ASCVD risk inaccurate due to young age -we discussed options, including monitoring symptoms, treadmill stress, and coronary CTA. She wishes to proceed with coronary CTA. Counseled on need for IV, contrast dye, radiation exposure. She is ok with this. BMET one week prior, no metoprolol given resting bradycardia. -we discussed her CV risk, including smoking, today.  Palpitations: notes nearly daily episodes of her heart racing. Believes she was told in the past she has irregular beats -7 day Zio monitor ordered, instructed on use  Tobacco cessation: The patient was counseled on tobacco cessation today for 4 minutes.  Counseling included reviewing the risks of smoking tobacco products, how it impacts the patient's current medical diagnoses and different strategies for quitting.  Pharmacotherapy to aid in tobacco cessation was not prescribed today.  COVID-19 Education: The signs and symptoms of COVID-19 were discussed with the patient and how to seek care for testing (follow up with PCP or arrange E-visit).  The importance of social distancing was discussed today.  Time:   Today, I have spent 47 minutes with the patient with telehealth technology discussing the above problems.    Patient Instructions  Medication Instructions:  Your Physician recommend you continue on your current medication as directed.    If you need a refill on your  cardiac medications before your next appointment, please call your pharmacy.   Lab work: Your physician recommends that you return for lab work 1 week prior to procedure (BMP).  If you have labs (blood work) drawn today and your tests are completely normal, you will receive your results only by: Marland Kitchen MyChart Message (if you have MyChart) OR . A paper copy in the mail If you have any lab test that is abnormal or we need to change your treatment, we will call you to review the results.  Testing/Procedures: Our physician has recommended that you wear an 7  DAY ZIO-PATCH monitor. The Zio patch cardiac monitor continuously records heart rhythm data for up to 14 days, this is for patients being evaluated for multiple types heart rhythms. For the first 24 hours post application, please avoid getting the [facility] monitor  wet in the shower or by excessive sweating during exercise. After that, feel free to carry on with regular activities. Keep soaps and lotions away from the ZIO XT Patch.   This will be placed at our [facility] location - [address], [address].     Your physician has requested that you have cardiac CT. Cardiac computed tomography (CT) is a painless test that uses an x-ray machine to take clear, detailed pictures of your heart. For further information please visit HugeFiesta.tn. Please follow instruction sheet as given. [facility]  Follow-Up: At [facility], you and your health needs are our priority.  As part of our continuing mission to provide you with exceptional heart care, we have created designated Provider Care Teams.  These Care Teams include your primary Cardiologist (physician) and Advanced Practice Providers (APPs -  Physician Assistants and Nurse Practitioners) who all work together to provide you with the care you need, when you need it. You will need a follow up appointment in 2 months.  Please call our office 2 months in advance to schedule this appointment.   You may see Buford Dresser, MD or one of the following Advanced Practice Providers on your designated Care Team:   Rosaria Ferries, PA-C . Jory Sims, DNP, ANP  Please arrive at the [facility] main entrance of [facility] at xx:xx AM (30-45 minutes prior to test start time)  [facility] [place], [place] [address] [phone]  Proceed to the [facility] Radiology Department (First Floor).  Please follow these instructions carefully (unless otherwise directed):  On the Night Before the Test: . Be sure to Drink plenty of water. . Do not consume any caffeinated/decaffeinated beverages or chocolate 12 hours prior to your test. . Do not take any antihistamines 12 hours prior to your test.    On the Day of the Test: . Drink plenty of water. Do not drink any water within one hour of the test. . Do not eat any food 4 hours prior to the test. . You may take your regular medications prior to the test.        After the Test: . Drink plenty of water. . After receiving IV contrast, you may experience a mild flushed feeling. This is normal. . On occasion, you may experience a mild rash up to 24 hours after the test. This is not dangerous. If this occurs, you can take Benadryl 25 mg and increase your fluid intake. . If you experience trouble breathing, this can be serious. If it is severe call 911 IMMEDIATELY. If it is mild, please call our office. . If you take any of these medications: Glipizide/Metformin, Avandament, Glucavance, please do not take 48 hours after completing test.     Medication Adjustments/Labs and Tests Ordered: Current medicines are reviewed at length with the patient today.  Concerns regarding medicines are outlined above.   Tests Ordered: Orders Placed This Encounter  Procedures  . CT CORONARY MORPH W/CTA COR W/SCORE W/CA W/CM &/OR WO/CM  . CT CORONARY FRACTIONAL FLOW RESERVE DATA PREP  . CT CORONARY FRACTIONAL  FLOW RESERVE FLUID ANALYSIS  . Basic metabolic panel  . LONG TERM MONITOR (3-14 DAYS)    Medication Changes: No orders of the defined types were placed in this encounter.   Follow Up:  2 mos  Signed, Buford Dresser, MD  06/05/2019 9:58 AM    [facility]

## 2019-06-05 NOTE — Patient Instructions (Addendum)
Medication Instructions:  Your Physician recommend you continue on your current medication as directed.    If you need a refill on your cardiac medications before your next appointment, please call your pharmacy.   Lab work: Your physician recommends that you return for lab work 1 week prior to procedure (BMP).  If you have labs (blood work) drawn today and your tests are completely normal, you will receive your results only by: Marland Kitchen MyChart Message (if you have MyChart) OR . A paper copy in the mail If you have any lab test that is abnormal or we need to change your treatment, we will call you to review the results.  Testing/Procedures: Our physician has recommended that you wear an 7  DAY ZIO-PATCH monitor. The Zio patch cardiac monitor continuously records heart rhythm data for up to 14 days, this is for patients being evaluated for multiple types heart rhythms. For the first 24 hours post application, please avoid getting the Zio monitor wet in the shower or by excessive sweating during exercise. After that, feel free to carry on with regular activities. Keep soaps and lotions away from the ZIO XT Patch.   This will be placed at our [facility] location - [address].     Your physician has requested that you have cardiac CT. Cardiac computed tomography (CT) is a painless test that uses an x-ray machine to take clear, detailed pictures of your heart. For further information please visit HugeFiesta.tn. Please follow instruction sheet as given. [facility]  Follow-Up: At [facility], you and your health needs are our priority.  As part of our continuing mission to provide you with exceptional heart care, we have created designated Provider Care Teams.  These Care Teams include your primary Cardiologist (physician) and Advanced Practice Providers (APPs -  Physician Assistants and Nurse Practitioners) who all work together to provide you with the care you need, when  you need it. You will need a follow up appointment in 2 months.  Please call our office 2 months in advance to schedule this appointment.  You may see Buford Dresser, MD or one of the following Advanced Practice Providers on your designated Care Team:   Rosaria Ferries, PA-C . Jory Sims, DNP, ANP  Please arrive at the [facility] main entrance of [facility] at xx:xx AM (30-45 minutes prior to test start time)  [facility] [place], [place] [address] [phone]  Proceed to the [facility] Radiology Department (First Floor).  Please follow these instructions carefully (unless otherwise directed):  On the Night Before the Test: . Be sure to Drink plenty of water. . Do not consume any caffeinated/decaffeinated beverages or chocolate 12 hours prior to your test. . Do not take any antihistamines 12 hours prior to your test.    On the Day of the Test: . Drink plenty of water. Do not drink any water within one hour of the test. . Do not eat any food 4 hours prior to the test. . You may take your regular medications prior to the test.        After the Test: . Drink plenty of water. . After receiving IV contrast, you may experience a mild flushed feeling. This is normal. . On occasion, you may experience a mild rash up to 24 hours after the test. This is not dangerous. If this occurs, you can take Benadryl 25 mg and increase your fluid intake. Marland Kitchen  If you experience trouble breathing, this can be serious. If it is severe call 911 IMMEDIATELY. If it is mild, please call our office. . If you take any of these medications: Glipizide/Metformin, Avandament, Glucavance, please do not take 48 hours after completing test.

## 2019-06-05 NOTE — Telephone Encounter (Signed)
Enrolled patient for a 7 Day Zio monitor to be mailed. Brief instructions were gone over with the patient and she knows to expect the monitor to arrive in 3-4 days.

## 2019-06-09 ENCOUNTER — Telehealth (HOSPITAL_COMMUNITY): Payer: Self-pay | Admitting: Emergency Medicine

## 2019-06-09 NOTE — Telephone Encounter (Signed)
Left message on voicemail with name and callback number Lundyn Coste RN Navigator Cardiac Imaging [facility] [phone] Office [phone] Cell  

## 2019-06-10 ENCOUNTER — Other Ambulatory Visit: Payer: Self-pay

## 2019-06-10 ENCOUNTER — Ambulatory Visit (HOSPITAL_COMMUNITY): Payer: Medicare Other

## 2019-06-10 ENCOUNTER — Ambulatory Visit (HOSPITAL_COMMUNITY)
Admission: RE | Admit: 2019-06-10 | Discharge: 2019-06-10 | Disposition: A | Discharge status: Home / Self Care | Payer: Medicare Other | Source: Ambulatory Visit | Attending: Cardiology | Admitting: Cardiology

## 2019-06-10 ENCOUNTER — Encounter (HOSPITAL_COMMUNITY): Payer: Self-pay

## 2019-06-10 DIAGNOSIS — R072 Precordial pain: Secondary | ICD-10-CM | POA: Diagnosis not present

## 2019-06-10 MED ORDER — IBUPROFEN 200 MG PO TABS
600.0000 mg | ORAL_TABLET | Freq: Once | ORAL | Status: AC
  Administered 2019-06-10: 600 mg via ORAL

## 2019-06-10 MED ORDER — NITROGLYCERIN 0.4 MG SL SUBL
SUBLINGUAL_TABLET | SUBLINGUAL | Status: AC
  Filled 2019-06-10: qty 2

## 2019-06-10 MED ORDER — IBUPROFEN 200 MG PO TABS
ORAL_TABLET | ORAL | Status: AC
  Filled 2019-06-10: qty 3

## 2019-06-10 MED ORDER — NITROGLYCERIN 0.4 MG SL SUBL
0.8000 mg | SUBLINGUAL_TABLET | Freq: Once | SUBLINGUAL | Status: AC
  Administered 2019-06-10: 0.8 mg via SUBLINGUAL

## 2019-06-10 NOTE — Progress Notes (Signed)
Patient stated started to have a panic attack in CT from having a CT, looking at the monitor, and developed a headache 8/10 throbbing after nitro administration. Patient refused to continued CT offered anti anxiety medication and did not want medication.  Dr Meda Coffee Cardiologist notified and ordered ibuprofen 600mg  per patient request. Patient also given cracker and coke. States feels better and pain 4/10 throbbing headache and ready to go home. Ambulated to exit steady gait.

## 2019-06-27 ENCOUNTER — Emergency Department (HOSPITAL_COMMUNITY)
Admission: EM | Admit: 2019-06-27 | Discharge: 2019-06-27 | Disposition: A | Discharge status: Home / Self Care | Payer: Medicare Other | Attending: Emergency Medicine | Admitting: Emergency Medicine

## 2019-06-27 ENCOUNTER — Encounter (HOSPITAL_COMMUNITY): Payer: Self-pay | Admitting: Emergency Medicine

## 2019-06-27 ENCOUNTER — Other Ambulatory Visit: Payer: Self-pay

## 2019-06-27 DIAGNOSIS — J45909 Unspecified asthma, uncomplicated: Secondary | ICD-10-CM | POA: Diagnosis not present

## 2019-06-27 DIAGNOSIS — Z79899 Other long term (current) drug therapy: Secondary | ICD-10-CM | POA: Diagnosis not present

## 2019-06-27 DIAGNOSIS — F17210 Nicotine dependence, cigarettes, uncomplicated: Secondary | ICD-10-CM | POA: Diagnosis not present

## 2019-06-27 DIAGNOSIS — R109 Unspecified abdominal pain: Secondary | ICD-10-CM | POA: Insufficient documentation

## 2019-06-27 DIAGNOSIS — R197 Diarrhea, unspecified: Secondary | ICD-10-CM | POA: Insufficient documentation

## 2019-06-27 LAB — COMPREHENSIVE METABOLIC PANEL
ALT: 17 U/L (ref 0–44)
AST: 22 U/L (ref 15–41)
Albumin: 4.2 g/dL (ref 3.5–5.0)
Alkaline Phosphatase: 55 U/L (ref 38–126)
Anion gap: 8 (ref 5–15)
BUN: 12 mg/dL (ref 6–20)
CO2: 23 mmol/L (ref 22–32)
Calcium: 9 mg/dL (ref 8.9–10.3)
Chloride: 108 mmol/L (ref 98–111)
Creatinine, Ser: 0.69 mg/dL (ref 0.44–1.00)
GFR calc Af Amer: >60 mL/min (ref >60)
GFR calc non Af Amer: >60 mL/min (ref >60)
Glucose, Bld: 107 mg/dL — ABNORMAL HIGH (ref 70–99)
Potassium: 4.3 mmol/L (ref 3.5–5.1)
Sodium: 139 mmol/L (ref 135–145)
Total Bilirubin: 0.1 mg/dL — ABNORMAL LOW (ref 0.3–1.2)
Total Protein: 7.1 g/dL (ref 6.5–8.1)

## 2019-06-27 LAB — CBC WITH DIFFERENTIAL/PLATELET
Abs Immature Granulocytes: 0.02 10*3/uL (ref 0.00–0.07)
Basophils Absolute: 0 10*3/uL (ref 0.0–0.1)
Basophils Relative: 0 %
Eosinophils Absolute: 0 10*3/uL (ref 0.0–0.5)
Eosinophils Relative: 0 %
HCT: 41.8 % (ref 36.0–46.0)
Hemoglobin: 13.2 g/dL (ref 12.0–15.0)
Immature Granulocytes: 0 %
Lymphocytes Relative: 6 %
Lymphs Abs: 0.5 10*3/uL — ABNORMAL LOW (ref 0.7–4.0)
MCH: 28.9 pg (ref 26.0–34.0)
MCHC: 31.6 g/dL (ref 30.0–36.0)
MCV: 91.5 fL (ref 80.0–100.0)
Monocytes Absolute: 0.3 10*3/uL (ref 0.1–1.0)
Monocytes Relative: 3 %
Neutro Abs: 8.7 10*3/uL — ABNORMAL HIGH (ref 1.7–7.7)
Neutrophils Relative %: 91 %
Platelets: 286 10*3/uL (ref 150–400)
RBC: 4.57 MIL/uL (ref 3.87–5.11)
RDW: 12.8 % (ref 11.5–15.5)
WBC: 9.5 10*3/uL (ref 4.0–10.5)
nRBC: 0 % (ref 0.0–0.2)

## 2019-06-27 LAB — URINALYSIS, ROUTINE W REFLEX MICROSCOPIC
Bacteria, UA: NONE SEEN
Bilirubin Urine: NEGATIVE
Glucose, UA: NEGATIVE mg/dL
Hgb urine dipstick: NEGATIVE
Ketones, ur: 5 mg/dL — AB
Leukocytes,Ua: NEGATIVE
Nitrite: NEGATIVE
Protein, ur: NEGATIVE mg/dL
Specific Gravity, Urine: 1.027 (ref 1.005–1.030)
pH: 5 (ref 5.0–8.0)

## 2019-06-27 LAB — POC URINE PREG, ED: Preg Test, Ur: NEGATIVE

## 2019-06-27 LAB — LIPASE, BLOOD: Lipase: 23 U/L (ref 11–51)

## 2019-06-27 MED ORDER — DICYCLOMINE HCL 10 MG PO CAPS
10.0000 mg | ORAL_CAPSULE | Freq: Once | ORAL | Status: AC
  Administered 2019-06-27: 10 mg via ORAL
  Filled 2019-06-27: qty 1

## 2019-06-27 MED ORDER — ONDANSETRON HCL 4 MG/2ML IJ SOLN
4.0000 mg | Freq: Once | INTRAMUSCULAR | Status: AC
  Administered 2019-06-27: 4 mg via INTRAVENOUS
  Filled 2019-06-27: qty 2

## 2019-06-27 MED ORDER — ALUM & MAG HYDROXIDE-SIMETH 200-200-20 MG/5ML PO SUSP
30.0000 mL | Freq: Once | ORAL | Status: AC
  Administered 2019-06-27: 30 mL via ORAL
  Filled 2019-06-27: qty 30

## 2019-06-27 NOTE — ED Notes (Signed)
Patient was able to tolerate water w/o any difficulties or complaints.
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
## 2019-06-27 NOTE — ED Notes (Signed)
Patient given discharge teaching and verbalized understanding. Patient ambulated out of ED with a steady gait. 

## 2019-06-27 NOTE — ED Notes (Signed)
Patient requesting to use the restroom prior to the completion of triage.

## 2019-06-27 NOTE — ED Triage Notes (Signed)
Per EMS, patient from home, c/o lower abdominal pain with headache and diarrhea since this morning. Denies N/V and urinary sx. Reports drinking a fifth of liquor last night.

## 2019-07-01 ENCOUNTER — Other Ambulatory Visit: Payer: Self-pay

## 2019-07-01 ENCOUNTER — Encounter: Payer: Self-pay | Admitting: Family Medicine

## 2019-07-01 ENCOUNTER — Telehealth: Payer: Self-pay | Admitting: Family Medicine

## 2019-07-01 ENCOUNTER — Ambulatory Visit (INDEPENDENT_AMBULATORY_CARE_PROVIDER_SITE_OTHER): Payer: Medicare Other | Admitting: Family Medicine

## 2019-07-01 VITALS — BP 132/80 | HR 73 | Wt 154.6 lb

## 2019-07-01 DIAGNOSIS — K5909 Other constipation: Secondary | ICD-10-CM | POA: Diagnosis not present

## 2019-07-01 DIAGNOSIS — R634 Abnormal weight loss: Secondary | ICD-10-CM

## 2019-07-01 MED ORDER — POLYETHYLENE GLYCOL 3350 17 GM/SCOOP PO POWD: 17.0000 g | Freq: Two times a day (BID) | ORAL | 1 refills | Status: DC | PRN

## 2019-07-01 NOTE — Progress Notes (Signed)
Date of Visit: 07/01/2019   CC:  Abdominal pain, gas   HPI:  Rhonda Cabrera is a 33 year old women with past history of chronic constipation secondary to long term use of buprenorphine-naloxone presenting today with abdominal pain and gas. She was seen in the ED for abdominal pain and diarrhea 4 days ago where they worked her up for appendicitis, obstruction, diverticulitis and kidney stones. Their workup showed that those were unlikely. She was given Zofran, Bentyl, and GI cocktail which made her abdominal pain less severe. The following morning the abdominal pain was present but was not as severe as when she presented to the ED.   In the past two days, her abdominal pain and gas has been more significant and more significant. She feels bloated and is having difficulty passing stool or gas. She is having to 'stimulate' herself in order to move her bowels which has made it even more uncomfortable. She states that when moving her bowels she strains and becomes diaphoretic. Her stool is hard and small. Her last bowel movement was yesterday. She also recently ran out of her MiraLAX.   No vomiting No blood in her stool but she sometimes notices blood on the toilet paper. No dysuria, hematuria, hematochezia. She has noticed some weigh loss, fatigue and cold sensitivity especially at night.   Patient is worried that there might be a bowel obstruction that is making it more difficult for her to pass stool. She is also worried about her thyroid since she has a family history of thyroid problems.   PMHX:  Medical Hx:   Chronic constipation-  controlled with MiraLAX  History of Opiate addiction- controlled with Suboxone Surgical Hx:  Cervical conization- 02/2018  Laparoscopy- 02/2018  Cholecystectomy- 10/2012  Tubal ligation- 08/2011 Medications:  Buprenorphine HCl-Naloxone HCl 8-2 MG FILM  Buspirone 15 MG tablet  Fluvaxamine 100 MG Allergies: none she could recall  FHX:   Colon cancer:  Grandfather Breast cancer: Aunt  Throat Cancer: Grandfather Diabetes Heart disease Drug abuse Thyroid disease   SHX:  Rhonda Cabrera has 6 children and leads a busy life. She mainly eats junk foods but tries to prepare a home cooked meal at least once per day for her kids. They eat cabbage, chicken, beef and macaroni. She thinks she could drink more water. She drinks coffee. She drinks alcohol occassionally. Smokes 0.5 packs/day of cigarettes for 12 years.   ROS: See HPI.  Constitutional: Slight weight loss. Trouble sleeping. No fever, night sweats or fatigue.   HEENT: No vision or hearing changes, nasal congestion, sore throat, or headaches.   Cardio: Occasional palpitations and some chest discomfort. No chest pain when exertion.  Respiratory: No cough, wheezing or dyspnea. GI: Diffuse abdominal discomfort and bloating. No vomiting, nausea, melena or diarrhea. GU: No dysuria, urinary frequency or incontinence.  MSK/Integumentary: No changes in skin, hair or nails. No myalgias or artheraligia.   Endocrine: Cold sensitivity.   PHYSICAL EXAM: BP 132/80   Pulse 73   Wt 154 lb 9.6 oz (70.1 kg)   SpO2 99%   BMI 24.21 kg/m  GEN: The patient was alert and oriented x3, pleasant anxious-appearing female in no acute distress.   HEENT:  Unremarkable, no cervical lymphadenopathy, no thyromegaly. CV:  S1S2 RRR, no m/r/g RESP:  Clear to auscultation bilaterally. No wheezing or crakels. ABD:  Soft, nontender, nondistended, normoactive bowel sounds without rebound or guarding. No CVA tenderness EXT:   No lower extremity edema   ASSESSMENT/PLAN:  Chronic constipation Rhonda Cabrera is presenting  with chronic history of Suboxone use and constipation that has become especially bothersome in the past two days. She is not able to completely empty her bowels and feels gassy. She has noticed some weight loss but denies melena, and fatigue. On physical exam the abdomen was soft and non-tender.At the top of my  differential is constipation secondary to long term use of Suboxone. So my goal is to get her having regular and soft bowel movements 1 times a day. I recommend she use MiraLAX 2 times daily with over the counter Docusate to achive that goal.  Second on my differential is constipation secondary to hypothyroidism because of her family history and abnormal TSH lab results in 2018. [place] this is lower on my differential because of an unremarkable thyroid physical exam and no history of fatigue, changes in hair, skin or nails. We are checking her TSH today to rule out thyroid disease.    FOLLOW UP: Follow up in 2-3 weeks with PCP.   Vernice Jefferson MS3, Medical Student   I was present during history and PE and agree with plan Lind Covert

## 2019-07-01 NOTE — Assessment & Plan Note (Addendum)
Rhonda Cabrera is presenting with chronic history of Suboxone use and constipation that has become especially bothersome in the past two days. She is not able to completely empty her bowels and feels gassy. She has noticed some weight loss but denies melena, and fatigue. On physical exam the abdomen was soft and non-tender.At the top of my differential is constipation secondary to long term use of Suboxone. So my goal is to get her having regular and soft bowel movements 1 times a day. I recommend she use MiraLAX 2 times daily with over the counter Docusate to achive that goal.  Second on my differential is constipation secondary to hypothyroidism because of her family history and abnormal TSH lab results in 2018. [place] this is lower on my differential because of an unremarkable thyroid physical exam and no history of fatigue, changes in hair, skin or nails. We are checking her TSH today to rule out thyroid disease.   No signs of bowel obstruction (vomiting)

## 2019-07-01 NOTE — Progress Notes (Signed)
Subjective  Rhonda Cabrera is a 33 y.o. female is presenting with the following  Chief Complaint noted Review of Symptoms - see HPI PMH - Smoking status noted.    Objective Vital Signs reviewed BP 132/80   Pulse 73   SpO2 99%   Assessments/Plans  See after visit summary for details of patient instuctions  No problem-specific Assessment & Plan notes found for this encounter.

## 2019-07-01 NOTE — Patient Instructions (Addendum)
Great seeing you today!  We have sent your prescription of MiraLAX to your pharmacy. I recommend using it 2 times a day. If you are not seeing any improvements, you may go up to 3 times a day. You may also get Docusate over the counter and take that 1 time daily.   Our goal is to get you to a point where you are having regular and soft bowel movements. I think treating your constipation will help with your abdominal pain and gas.   For the hemorrhoids, please get Preparation H over the counter. That can help you go more regular and make it more comfortable.   We will reach out to you if your thyroid results are abnormal.   Please schedule a visit with your regular doctor in 2 to 3 weeks to follow up on your symptoms.

## 2019-07-01 NOTE — Telephone Encounter (Signed)
**  After Hours/ Emergency Line Call**  Received a call to report that ALBIE BAZIN has been having abdominal pain and gas that has been chronic in nature but worsened recently. She normally will have a BM every 2-3 days, last BM 7/10 when she went to the ED.  Workup at that time unremarkable but did have resolution with bentyl and GI cocktail per ED note. Does state she often has to stimulate herself to have a BM. Able to tolerate PO ok without nausea and vomiting but is eating smaller amounts. Has started to try metamucil. Of note, is on chronic opioids. Prior abdominal surgeries include BTL and patient reported laparoscopic removal of endometriosis. Is concerned her hemorrhoids are causing gas blockage. No current evidence of infection. Likely 2/2 to chronic opioids but warrants in person exam to r/o obstruction. Discussed options with patient, elects to have clinic appt later this morning.  Red flags discussed.  Will forward to PCP.  Rory Percy, DO PGY-3, [facility] 07/01/2019 6:22 AM

## 2019-07-02 ENCOUNTER — Encounter: Payer: Self-pay | Admitting: Family Medicine

## 2019-07-02 LAB — TSH: TSH: 0.806 u[IU]/mL (ref 0.450–4.500)

## 2019-07-02 NOTE — Telephone Encounter (Signed)
I spoke with Rhonda Cabrera this morning. She is still having abdominal pain and was woken up by the pain this morning. Reports that she took some prescription metamucil and ibuprofen which provided some relief. Denies any BM for the last 2-3 day but says that it is normal for her. Red flags discussed. Reports she will schedule an appointment if she start vomiting or does not have a BM in the next day or 2.   Gifford Shave, MD  PGY-1, [facility]  07/02/2019 8:55 AM

## 2019-07-14 ENCOUNTER — Encounter (HOSPITAL_COMMUNITY): Payer: Self-pay | Admitting: Emergency Medicine

## 2019-07-14 ENCOUNTER — Ambulatory Visit (HOSPITAL_COMMUNITY)
Admission: EM | Admit: 2019-07-14 | Discharge: 2019-07-14 | Disposition: A | Discharge status: Home / Self Care | Payer: Medicare Other | Attending: Internal Medicine | Admitting: Internal Medicine

## 2019-07-14 ENCOUNTER — Other Ambulatory Visit: Payer: Self-pay

## 2019-07-14 ENCOUNTER — Ambulatory Visit (INDEPENDENT_AMBULATORY_CARE_PROVIDER_SITE_OTHER): Payer: Medicare Other

## 2019-07-14 DIAGNOSIS — K5901 Slow transit constipation: Secondary | ICD-10-CM | POA: Diagnosis not present

## 2019-07-14 DIAGNOSIS — R1030 Lower abdominal pain, unspecified: Secondary | ICD-10-CM | POA: Diagnosis not present

## 2019-07-14 LAB — POCT URINALYSIS DIP (DEVICE)
Bilirubin Urine: NEGATIVE
Glucose, UA: NEGATIVE mg/dL
Hgb urine dipstick: NEGATIVE
Ketones, ur: NEGATIVE mg/dL
Leukocytes,Ua: NEGATIVE
Nitrite: NEGATIVE
Protein, ur: NEGATIVE mg/dL
Specific Gravity, Urine: >=1.03 (ref 1.005–1.030)
Urobilinogen, UA: 0.2 mg/dL (ref 0.0–1.0)
pH: 7 (ref 5.0–8.0)

## 2019-07-14 LAB — POCT PREGNANCY, URINE: Preg Test, Ur: NEGATIVE

## 2019-07-14 MED ORDER — SENNA 8.6 MG PO TABS: 2.0000 | ORAL_TABLET | Freq: Two times a day (BID) | ORAL | 0 refills | Status: DC
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
## 2019-07-14 NOTE — ED Triage Notes (Signed)
Pt here for rectal pain and lower abd pain; pt sts painful to sit down

## 2019-08-01 ENCOUNTER — Telehealth: Payer: Self-pay | Admitting: Family Medicine

## 2019-08-01 NOTE — Telephone Encounter (Signed)
**  After Hours/ Emergency Line Call**  Received a call to report that Rhonda Cabrera is concerned she is having a stroke. She reports the bottom lid of her R eye has been twitching for the past few days and has had a headache. She does have a h/o migraines and reports this headache feels similar to prior migraines but is not as intense. She normally has photophobia and nausea which she is not currently experiencing. Her headache is throbbing in nature and located at her temples. She took some ibuprofen earlier tonight and relieved the headache a bit. She denies any head trauma. She is not on blood thinners. The headache is not keeping her from doing her daily activities. She denies facial droop, weakness of extremities. Her speech is normal.   The twitching of her eyelid comes and goes. She denies any vision changes.  She also reports she sees Psychiatry, Dr. Alphonzo Grieve, for anxiety and was recently taken off of her seroquel. She is also prescribed luvox and buspar but is not taking these for some time because she did not want to be on a lot of medications. She states she did not let her psychiatrist know she is not taking them. She does wonder if her symptoms are due to stress as she is going through a lot with her kids starting school next week and she is currently undergoing workup for chest pain which she denies any current symptoms. She has a follow up appointment with Cardiology 8/19 and Psychiatry 8/26.   Advised patient symptoms are not consistent with stroke and given she is not currently having any chest pain or other worrisome symptoms, she can likely be evaluated in the clinic. Made appointment for Tuesday due to patient scheduling conflicts on Monday. Gave strict precautions on when to present to the ED. Patient verbalized understanding. Will forward to PCP.  Rory Percy, DO PGY-3, [facility] 08/01/2019 11:50 PM

## 2019-08-05 ENCOUNTER — Ambulatory Visit: Payer: Medicare Other

## 2019-08-06 ENCOUNTER — Telehealth: Payer: Self-pay | Admitting: *Deleted

## 2019-08-06 ENCOUNTER — Telehealth (INDEPENDENT_AMBULATORY_CARE_PROVIDER_SITE_OTHER): Payer: Medicare Other | Admitting: Cardiology

## 2019-08-06 ENCOUNTER — Other Ambulatory Visit: Payer: Self-pay

## 2019-08-06 DIAGNOSIS — R079 Chest pain, unspecified: Secondary | ICD-10-CM

## 2019-08-06 NOTE — Progress Notes (Signed)
Virtual Visit via Video Note   This visit type was conducted due to national recommendations for restrictions regarding the COVID-19 Pandemic (e.g. social distancing) in an effort to limit this patient's exposure and mitigate transmission in our community.  Due to her co-morbid illnesses, this patient is at least at moderate risk for complications without adequate follow up.  This format is felt to be most appropriate for this patient at this time.  All issues noted in this document were discussed and addressed.  A limited physical exam was performed with this format.  Please refer to the patient's chart for her consent to telehealth for [facility].   Date:  08/06/2019   ID:  Rhonda Cabrera, DOB 1986/06/10, MRN [id number]  Patient Location: Home Provider Location: Home  PCP:  Gifford Shave, MD  Cardiologist:  Buford Dresser, MD  Electrophysiologist:  None   Evaluation Performed:  Follow up  Chief Complaint:  Chest pain/palpitations  History of Present Illness:    Rhonda Cabrera is a 33 y.o. female with PMH anxiety/depression, recovering from opiate dependence on suboxone, tobacco dependence who is seen for follow up. She was initially seen as a new consult at the requ[facility]st of Dr. Everrett Coombe for chest pain and palpitations on 06/05/19.  The patient does not have symptoms concerning for COVID-19 infection (fever, chills, cough, or new shortness of breath).   Today: She attempted to have cardiac CT performed but had a panic attack and developed a headache after nitroglycerin administration. She is scheduled to have the CT performed on 08/11/19. We reviewed the test again, what is involved, medications we use, risks/benefits. We also discussed alternatives including treadmill stress test, which does not involve medications, IV, radiation, or dye use.   She continues to have the same chest pain/palpitations, no change. Sharp/throbbing pain, palpitations are  intermittent. She did wear monitor but can't find the packaging to return it.   Cardiac risk factors: -Alcohol: occasional but rarely -Tobacco: current smoker, trying to cut back as the pain is worse when she smokes. Has talked to PCP about smoking aids -History: History of opiate dependence after tooth pain, recovered, was on methadone for 4 years, was told she had an irregular beat. Now on suboxone -Comorbidities: has a history of anxiety and panic attacks. No formal diagnosis of hypertension or diabetes, though reports a prior history of elevated sugar around the time of surgery. Also reports prior episode of high blood pressure, was on diuretic briefly and then stopped.  -Cardiac ROS: no shortness of breath, no PND, no orthopnea, no LE edema, no syncope -Family history: aunt had sleep apnea. Dad has heart issues. Grandmother had a heart attack and a stroke.   Past Medical History:  Diagnosis Date   Abnormal Pap smear    Anemia    Anxiety    Asthma    Bipolar 1 disorder ([place])    Chlamydia 07/12/2012   Depression    Family history of adverse reaction to anesthesia    Headache    miraines   Hypertension    with pregnancy only   Opiate addiction ([place])    Pneumonia 2017   used inhaler    Polysubstance abuse ([place])    PONV (postoperative nausea and vomiting)    SEVERE ITCHING AFTER EPIDURAL   Past Surgical History:  Procedure Laterality Date   CERVICAL CONIZATION W/BX N/A 02/19/2018   Procedure: CONIZATION CERVIX WITH BIOPSY - COLD KNIFE;  Surgeon: [name], MD;  Location: [facility];  Service: Gynecology;  Laterality: N/A;   CHOLECYSTECTOMY  11/04/2012   Procedure: LAPAROSCOPIC CHOLECYSTECTOMY WITH INTRAOPERATIVE CHOLANGIOGRAM;  Surgeon: Imogene Burn. Georgette Dover, MD;  Location: [facility];  Service: General;  Laterality: N/A;   LAPAROSCOPY N/A 02/19/2018   Procedure: LAPAROSCOPY DIAGNOSTIC WITH PERITONEAL BIOPSIES;  Surgeon: [name], MD;  Location: [facility];  Service:  Gynecology;  Laterality: N/A;   TUBAL LIGATION  09/08/2011   Procedure: POST PARTUM TUBAL LIGATION;  Surgeon: Emeterio Reeve, MD;  Location: [facility];  Service: Gynecology;  Laterality: Bilateral;   vaginal deliveries       Current Meds  Medication Sig   Buprenorphine HCl-Naloxone HCl 8-2 MG FILM Place 1 Film under the tongue 2 (two) times a day.   busPIRone (BUSPAR) 15 MG tablet Take 1 tablet by mouth 2 (two) times a day.   ibuprofen (ADVIL,MOTRIN) 800 MG tablet Take 1 tablet (800 mg total) by mouth 3 (three) times daily. (Patient taking differently: Take 800 mg by mouth 3 (three) times daily as needed for headache or mild pain. )   naproxen sodium (ALEVE) 220 MG tablet Take 220-440 mg by mouth 2 (two) times daily as needed (for pain or headaches).   polyethylene glycol powder (GLYCOLAX/MIRALAX) 17 GM/SCOOP powder Take 17 g by mouth 2 (two) times daily as needed.     Allergies:   Seroquel xr [quetiapine fumarate er]   Social History   Tobacco Use   Smoking status: Current Every Day Smoker    Packs/day: 0.50    Years: 12.00    Pack years: 6.00    Types: Cigarettes   Smokeless tobacco: Never Used   Tobacco comment: vaped for 1 month  Substance Use Topics   Alcohol use: No    Alcohol/week: 0.0 standard drinks   Drug use: Yes    Frequency: 7.0 times per week    Types: Oxycodone, Marijuana    Comment:  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     Family Hx: The patient's family history includes Diabetes in her maternal grandmother, paternal grandfather, and paternal grandmother; Drug abuse in her father and mother; Hypertension in her maternal grandmother.  ROS:   Please see the history of present illness.    Constitutional: Negative for chills, fever, night sweats, unintentional weight loss  HENT: Negative for ear pain and hearing loss.   Eyes: Negative for loss of vision and eye pain.  Respiratory: Negative for cough, sputum, wheezing.   Cardiovascular: See  HPI. Gastrointestinal: Negative for abdominal pain, melena, and hematochezia.  Genitourinary: Negative for dysuria and hematuria.  Musculoskeletal: Negative for falls and myalgias.  Skin: Negative for itching and rash.  Neurological: Negative for focal weakness, focal sensory changes and loss of consciousness.  Endo/Heme/Allergies: Does not bruise/bleed easily.    Prior CV studies:   The following studies were reviewed today: No prior cardiac eval  Labs/Other Tests and Data Reviewed:    EKG:  An ECG dated 05/28/19 was personally reviewed today and demonstrated:  SR/SA  Recent Labs: 06/27/2019: ALT 17; BUN 12; Creatinine, Ser 0.69; Hemoglobin 13.2; Platelets 286; Potassium 4.3; Sodium 139 07/01/2019: TSH 0.806   Recent Lipid Panel No results found for: CHOL, TRIG, HDL, CHOLHDL, LDLCALC, LDLDIRECT  Wt Readings from Last 3 Encounters:  07/01/19 154 lb 9.6 oz (70.1 kg)  06/05/19 160 lb (72.6 kg)  05/23/19 153 lb (69.4 kg)     Objective:    Vital Signs:  There were no vitals taken for this visit.  VITAL SIGNS:  reviewed GEN:  no acute distress EYES:  sclerae anicteric, EOMI - Extraocular Movements Intact RESPIRATORY:  normal respiratory effort, symmetric expansion CARDIOVASCULAR:  no peripheral edema SKIN:  no rash, lesions or ulcers. MUSCULOSKELETAL:  no obvious deformities. NEURO:  alert and oriented x 3, no obvious focal deficit PSYCH:  normal affect  ASSESSMENT & PLAN:    Chest pain: persists. Did not tolerate CT scan as she had panic attack. Feels that her anxiety is not well controlled recently with changes to her meds, which she is concerned to take. We discussed options of trying CT again vs. Treadmill.  I counseled that the NG may cause headaches but is crucial to the CT quality. Her HR has also been fast, which may limit CT image quality if we cannot control with metoprolol -She prefers to try treadmill at this time, and if it is abnormal would then proceed to  CT. -will place CT on hold and cancel if treadmill normal.  Palpitations: notes nearly daily episodes of her heart racing. Believes she was told in the past she has irregular beats -7 day Zio monitor completed, lost packaging, will send note to see if she can get a replacement.  COVID-19 Education: The signs and symptoms of COVID-19 were discussed with the patient and how to seek care for testing (follow up with PCP or arrange E-visit).  The importance of social distancing was discussed today.  Time:   Today, I have spent 16 minutes with the patient with telehealth technology discussing the above problems.    Patient Instructions  Medication Instructions:  Your Physician recommend you continue on your current medication as directed.    If you need a refill on your cardiac medications before your next appointment, please call your pharmacy.   Lab work: None  Testing/Procedures: Your physician has requested that you have an exercise tolerance test. For further information please visit [email or web address]. Please also follow instruction sheet, as given. [address]   Follow-Up: At [facility], you and your health needs are our priority.  As part of our continuing mission to provide you with exceptional heart care, we have created designated Provider Care Teams.  These Care Teams include your primary Cardiologist (physician) and Advanced Practice Providers (APPs -  Physician Assistants and Nurse Practitioners) who all work together to provide you with the care you need, when you need it. You will need a follow up appointment in 1 months.  Please call our office 2 months in advance to schedule this appointment.  You may see Buford Dresser, MD or one of the following Advanced Practice Providers on your designated Care Team:   Rosaria Ferries, PA-C  Jory Sims, DNP, ANP    [facility] at [facility] [address] [place], [place] [address] Phone:  [phone]        You are scheduled for an Exercise Stress Test on   Please arrive 15 minutes prior to your appointment time for registration and insurance purposes.  The test will take approximately 45 minutes to complete.  How to prepare for your Exercise Stress Test:  Do bring a list of your current medications with you.  If not listed below, you may take your medications as normal.  Do wear comfortable clothes (no dresses or overalls) and walking shoes, tennis shoes preferred (no heels or open toed shoes are allowed)  Do Not wear cologne, perfume, aftershave or lotions (deodorant is allowed).  Please report  to [place], [address] for your test.  If these instructions are not followed, your test will have to be rescheduled.  If you have questions or concerns about your appointment, you can call the Stress Lab at [phone].  If you cannot keep your appointment, please provide 24 hours notification to the Stress Lab, to avoid a possible $50 charge to your account      Medication Adjustments/Labs and Tests Ordered: Current medicines are reviewed at length with the patient today.  Concerns regarding medicines are outlined above.   Tests Ordered: Orders Placed This Encounter  Procedures   EXERCISE TOLERANCE TEST (ETT)    Medication Changes: No orders of the defined types were placed in this encounter.   Follow Up:  4-6 weeks  Signed, Buford Dresser, MD  08/06/2019 4:22 PM    [facility]

## 2019-08-06 NOTE — Patient Instructions (Addendum)
Medication Instructions:  Your Physician recommend you continue on your current medication as directed.    If you need a refill on your cardiac medications before your next appointment, please call your pharmacy.   Lab work: None  Testing/Procedures: Your physician has requested that you have an exercise tolerance test. For further information please visit [email or web address]. Please also follow instruction sheet, as given. [place]   Follow-Up: At [facility], you and your health needs are our priority.  As part of our continuing mission to provide you with exceptional heart care, we have created designated Provider Care Teams.  These Care Teams include your primary Cardiologist (physician) and Advanced Practice Providers (APPs -  Physician Assistants and Nurse Practitioners) who all work together to provide you with the care you need, when you need it. You will need a follow up appointment in 1 months.  Please call our office 2 months in advance to schedule this appointment.  You may see Buford Dresser, MD or one of the following Advanced Practice Providers on your designated Care Team:   Rosaria Ferries, PA-C . Jory Sims, DNP, ANP    [facility] [address] [place],  [address] Phone:  [phone]        You are scheduled for an Exercise Stress Test on   Please arrive 15 minutes prior to your appointment time for registration and insurance purposes.  The test will take approximately 45 minutes to complete.  How to prepare for your Exercise Stress Test: . Do bring a list of your current medications with you.  If not listed below, you may take your medications as normal. . Do wear comfortable clothes (no dresses or overalls) and walking shoes, tennis shoes preferred (no heels or open toed shoes are allowed) . Do Not wear cologne, perfume, aftershave or lotions (deodorant is  allowed). . Please report to [place], [address] for your test.  If these instructions are not followed, your test will have to be rescheduled.  If you have questions or concerns about your appointment, you can call the Stress Lab at [phone].  If you cannot keep your appointment, please provide 24 hours notification to the Stress Lab, to avoid a possible $50 charge to your account

## 2019-08-06 NOTE — Telephone Encounter (Signed)
Left message for patient to call and schedule 1 month follow up appointment with Dr. Harrell Gave

## 2019-08-07 ENCOUNTER — Encounter: Payer: Self-pay | Admitting: Cardiology

## 2019-08-08 ENCOUNTER — Telehealth: Payer: Self-pay

## 2019-08-08 NOTE — Telephone Encounter (Signed)
Pt is scheduled for GXT on 08/28/19. Called pt to schedule COVID test for Monday 9/7. Left message for pt to call back.

## 2019-08-11 ENCOUNTER — Ambulatory Visit (HOSPITAL_COMMUNITY): Payer: Medicare Other

## 2019-08-11 ENCOUNTER — Telehealth: Payer: Self-pay | Admitting: *Deleted

## 2019-08-11 NOTE — Telephone Encounter (Signed)
Attempted to contact pt x 2 to schedule COVID test. Left message to call back.

## 2019-08-11 NOTE — Telephone Encounter (Signed)
[employer] will be notified to send patient another prepaid postage mailing box to send her ZIO  Patch monitor back to [facility] for scanning.

## 2019-08-14 NOTE — Telephone Encounter (Signed)
Attempted to contact pt x 3 to schedule pre-procedure COVID test. Left message to call back.

## 2019-08-15 ENCOUNTER — Emergency Department (HOSPITAL_COMMUNITY)
Admission: EM | Admit: 2019-08-15 | Discharge: 2019-08-15 | Disposition: A | Discharge status: Home / Self Care | Payer: Medicare Other | Attending: Emergency Medicine | Admitting: Emergency Medicine

## 2019-08-15 ENCOUNTER — Encounter (HOSPITAL_COMMUNITY): Payer: Self-pay

## 2019-08-15 ENCOUNTER — Other Ambulatory Visit: Payer: Self-pay

## 2019-08-15 ENCOUNTER — Emergency Department (HOSPITAL_COMMUNITY): Payer: Medicare Other

## 2019-08-15 DIAGNOSIS — R002 Palpitations: Secondary | ICD-10-CM | POA: Diagnosis present

## 2019-08-15 DIAGNOSIS — F17210 Nicotine dependence, cigarettes, uncomplicated: Secondary | ICD-10-CM | POA: Diagnosis not present

## 2019-08-15 DIAGNOSIS — R590 Localized enlarged lymph nodes: Secondary | ICD-10-CM | POA: Diagnosis not present

## 2019-08-15 DIAGNOSIS — J45909 Unspecified asthma, uncomplicated: Secondary | ICD-10-CM | POA: Diagnosis not present

## 2019-08-15 DIAGNOSIS — I10 Essential (primary) hypertension: Secondary | ICD-10-CM | POA: Diagnosis not present

## 2019-08-15 DIAGNOSIS — R0789 Other chest pain: Secondary | ICD-10-CM | POA: Diagnosis not present

## 2019-08-15 DIAGNOSIS — R079 Chest pain, unspecified: Secondary | ICD-10-CM

## 2019-08-15 LAB — CBC
HCT: 39.3 % (ref 36.0–46.0)
Hemoglobin: 12.4 g/dL (ref 12.0–15.0)
MCH: 28.1 pg (ref 26.0–34.0)
MCHC: 31.6 g/dL (ref 30.0–36.0)
MCV: 89.1 fL (ref 80.0–100.0)
Platelets: 314 10*3/uL (ref 150–400)
RBC: 4.41 MIL/uL (ref 3.87–5.11)
RDW: 12.7 % (ref 11.5–15.5)
WBC: 5.9 10*3/uL (ref 4.0–10.5)
nRBC: 0 % (ref 0.0–0.2)

## 2019-08-15 LAB — BASIC METABOLIC PANEL
Anion gap: 6 (ref 5–15)
BUN: 8 mg/dL (ref 6–20)
CO2: 25 mmol/L (ref 22–32)
Calcium: 9.4 mg/dL (ref 8.9–10.3)
Chloride: 105 mmol/L (ref 98–111)
Creatinine, Ser: 0.58 mg/dL (ref 0.44–1.00)
GFR calc Af Amer: >60 mL/min (ref >60)
GFR calc non Af Amer: >60 mL/min (ref >60)
Glucose, Bld: 99 mg/dL (ref 70–99)
Potassium: 3.6 mmol/L (ref 3.5–5.1)
Sodium: 136 mmol/L (ref 135–145)

## 2019-08-15 LAB — I-STAT BETA HCG BLOOD, ED (NOT ORDERABLE): I-stat hCG, quantitative: <5 m[IU]/mL (ref <5)

## 2019-08-15 LAB — TROPONIN I (HIGH SENSITIVITY): Troponin I (High Sensitivity): <2 ng/L (ref <18)

## 2019-08-15 MED ORDER — SODIUM CHLORIDE 0.9% FLUSH: 3.0000 mL | Freq: Once | INTRAVENOUS | Status: DC

## 2019-08-15 NOTE — ED Triage Notes (Signed)
Pt reports palpitations every few minutes. She states that she has been experiencing episodes like this intermittently over the last few months. Denies pain, but states that she can feel her heart beating through her chest when it happens. Also states that she feels tired after she feels a palpitation.  A&Ox4.

## 2019-08-15 NOTE — Discharge Instructions (Addendum)
Your testing here today did not show any significant abnormality.  Follow-up with your cardiologist.  I would call them today to let them know that she would come to the emergency department.  I would also call the breast center for follow-up on the lymph node in your axillary region.

## 2019-08-15 NOTE — ED Notes (Signed)
EKG given to EDp,Mesner,MD., for review.

## 2019-08-15 NOTE — ED Notes (Addendum)
Pt left without being able to go over D/C paperwork, update vital signs, or sign for D/C.

## 2019-08-15 NOTE — ED Notes (Signed)
Pt states she wants to leave.  She's upset that she's been here since 3 am, and she is worried about her kids at home.  Her husband left for work.

## 2019-08-18 ENCOUNTER — Encounter: Payer: Self-pay | Admitting: Family Medicine
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
## 2019-08-18 NOTE — Telephone Encounter (Signed)
Attempted to contact pt x 4. Left message to call back.

## 2019-08-26 ENCOUNTER — Telehealth (HOSPITAL_COMMUNITY): Payer: Self-pay

## 2019-08-26 ENCOUNTER — Other Ambulatory Visit: Payer: Self-pay | Admitting: Family Medicine

## 2019-08-26 ENCOUNTER — Ambulatory Visit (INDEPENDENT_AMBULATORY_CARE_PROVIDER_SITE_OTHER): Payer: Medicare Other | Admitting: Family Medicine

## 2019-08-26 ENCOUNTER — Encounter: Payer: Self-pay | Admitting: Family Medicine

## 2019-08-26 ENCOUNTER — Other Ambulatory Visit: Payer: Self-pay

## 2019-08-26 VITALS — BP 124/78 | Wt 153.0 lb

## 2019-08-26 DIAGNOSIS — N6320 Unspecified lump in the left breast, unspecified quadrant: Secondary | ICD-10-CM

## 2019-08-26 DIAGNOSIS — N63 Unspecified lump in unspecified breast: Secondary | ICD-10-CM | POA: Diagnosis not present

## 2019-08-26 DIAGNOSIS — Z803 Family history of malignant neoplasm of breast: Secondary | ICD-10-CM | POA: Diagnosis not present

## 2019-08-26 NOTE — Assessment & Plan Note (Signed)
Fibrous tissue appreciated on palpation of the left breast in the upper outer quadrant, nonmobile and nontender to palpation.  Symptoms and exam not consistent with infection.  Lower suspicion for malignancy, although patient with positive family history of breast cancer, would be reasonable to obtain diagnostic mammogram and/or ultrasound to fully evaluate.

## 2019-08-26 NOTE — Progress Notes (Signed)
  Subjective:   Patient ID: Rhonda Cabrera    DOB: June 15, 1986, 33 y.o. female   MRN: [id number]  Rhonda Cabrera is a 33 y.o. female with a history of migraine, asthma, constipation, depression, bipolar disorder, opiate addiction, tobacco dependence here for   Breast lump -Patient states she noted a lump in the outer upper portion of her left breast about 2 months ago. -Denies redness, swelling, warmth, nipple discharge. -Paternal aunt recently found out 6 months ago she has breast cancer, in her 64s. -Has a younger sister who does not have any breast issues. -The area is not particularly painful.  She does have ongoing chest pains which she is being worked up for with cardiology, has a stress test scheduled. -Denies any recent antibiotics. -Never had a mammogram. -See note from enough for endometriosis diagnosed on diagnostic laparoscopy.  Review of Systems:  Per HPI.  [place], medications and smoking status reviewed.  Objective:   BP 124/78   Wt 153 lb (69.4 kg)   LMP 08/21/2019 (Exact Date)   BMI 23.96 kg/m  Vitals and nursing note reviewed.  General: well nourished, well developed, in no acute distress with non-toxic appearance Breasts: right breast normal without mass, skin or nipple changes or axillary nodes. Fibrous tissue palpated in upper outer portion of L breast, not painful to palpation. Skin: warm, dry, no rashes or lesions Extremities: warm and well perfused, normal tone MSK: ROM grossly intact, strength intact, gait normal Neuro: Alert and oriented, speech normal  Assessment & Plan:   Breast lump Fibrous tissue appreciated on palpation of the left breast in the upper outer quadrant, nonmobile and nontender to palpation.  Symptoms and exam not consistent with infection.  Lower suspicion for malignancy, although patient with positive family history of breast cancer, would be reasonable to obtain diagnostic mammogram and/or ultrasound to fully evaluate.  No  orders of the defined types were placed in this encounter.  No orders of the defined types were placed in this encounter.   Rory Percy, DO PGY-3, [facility] 08/26/2019 11:58 AM

## 2019-08-26 NOTE — Telephone Encounter (Signed)
Encounter complete. 

## 2019-08-26 NOTE — Telephone Encounter (Signed)
Pt cardiologist nursing staff notified to order COVID testing and contact patient to reschedule.

## 2019-08-26 NOTE — Patient Instructions (Signed)
It was great to see you!  Our plans for today:  - We are getting an ultrasound of your breast. You will get a call about scheduling this appointment. - If you notice redness, warmth, swelling, or nipple discharge, come back to be seen.  Take care and seek immediate care sooner if you develop any concerns.   Dr. [facility]

## 2019-08-27 NOTE — Telephone Encounter (Signed)
Pt rescheduled GXT for 9/24. Nurse scheduled COVID test for 9/21 at 2:05 pm. Pt made aware.

## 2019-08-27 NOTE — Telephone Encounter (Signed)
Spoke with pt and informed that GXT would have to be rescheduled as Nurse was unable to reach pt to schedule COVID test within timeframe. Pt voiced understanding. Message will be routed to scheduler to contact pt to reschedule test date.

## 2019-08-28 ENCOUNTER — Inpatient Hospital Stay (HOSPITAL_COMMUNITY): Admission: RE | Admit: 2019-08-28 | Payer: Medicare Other | Source: Ambulatory Visit

## 2019-09-01 ENCOUNTER — Telehealth: Payer: Self-pay

## 2019-09-01 NOTE — Telephone Encounter (Signed)
Pt has an appointment with Dr. Harrell Gave on 9/17 and GXT on 9/24. Called pt to see if she would like to reschedule f/u appointment after test date.

## 2019-09-02 ENCOUNTER — Ambulatory Visit
Admission: RE | Admit: 2019-09-02 | Discharge: 2019-09-02 | Disposition: A | Discharge status: Home / Self Care | Payer: Medicare Other | Source: Ambulatory Visit | Attending: *Deleted | Admitting: *Deleted

## 2019-09-02 ENCOUNTER — Other Ambulatory Visit: Payer: Self-pay

## 2019-09-02 DIAGNOSIS — N6320 Unspecified lump in the left breast, unspecified quadrant: Secondary | ICD-10-CM

## 2019-09-04 ENCOUNTER — Ambulatory Visit: Payer: Medicare Other | Admitting: Cardiology

## 2019-09-04 NOTE — Telephone Encounter (Signed)
Spoke with pt and reschedule appointment until after test GXT on 9/24. Appointment moved to 9/29.

## 2019-09-08 ENCOUNTER — Other Ambulatory Visit (HOSPITAL_COMMUNITY): Payer: Medicare Other

## 2019-09-08 ENCOUNTER — Other Ambulatory Visit: Payer: Self-pay | Admitting: *Deleted

## 2019-09-08 DIAGNOSIS — Z20822 Contact with and (suspected) exposure to covid-19: Secondary | ICD-10-CM

## 2019-09-09 ENCOUNTER — Telehealth (HOSPITAL_COMMUNITY): Payer: Self-pay

## 2019-09-09 LAB — NOVEL CORONAVIRUS, NAA: SARS-CoV-2, NAA: NOT DETECTED

## 2019-09-09 NOTE — Telephone Encounter (Signed)
Encounter complete. 

## 2019-09-10 ENCOUNTER — Telehealth (HOSPITAL_COMMUNITY): Payer: Self-pay

## 2019-09-10 NOTE — Telephone Encounter (Signed)
Encounter complete. 

## 2019-09-11 ENCOUNTER — Other Ambulatory Visit: Payer: Self-pay

## 2019-09-11 ENCOUNTER — Ambulatory Visit (HOSPITAL_COMMUNITY)
Admission: RE | Admit: 2019-09-11 | Discharge: 2019-09-11 | Disposition: A | Discharge status: Home / Self Care | Payer: Medicare Other | Source: Ambulatory Visit | Attending: Cardiology | Admitting: Cardiology

## 2019-09-11 DIAGNOSIS — R079 Chest pain, unspecified: Secondary | ICD-10-CM | POA: Insufficient documentation

## 2019-09-12 LAB — EXERCISE TOLERANCE TEST
Estimated workload: 10.1 METS
Exercise duration (min): 9 min
Exercise duration (sec): 0 s
MPHR: 188 {beats}/min
Peak HR: 169 {beats}/min
Percent HR: 90 %
Rest HR: 71 {beats}/min

## 2019-09-16 ENCOUNTER — Ambulatory Visit: Payer: Medicare Other | Admitting: Cardiology

## 2019-10-15 ENCOUNTER — Other Ambulatory Visit: Payer: Self-pay

## 2019-10-15 DIAGNOSIS — Z20822 Contact with and (suspected) exposure to covid-19: Secondary | ICD-10-CM

## 2019-10-17 LAB — NOVEL CORONAVIRUS, NAA: SARS-CoV-2, NAA: NOT DETECTED

## 2019-10-25 ENCOUNTER — Telehealth: Payer: Self-pay | Admitting: Family Medicine

## 2019-10-25 DIAGNOSIS — K5909 Other constipation: Secondary | ICD-10-CM

## 2019-10-25 DIAGNOSIS — K5641 Fecal impaction: Secondary | ICD-10-CM

## 2019-10-25 MED ORDER — HYDROCORTISONE (PERIANAL) 2.5 % EX CREA: 1.0000 "application " | TOPICAL_CREAM | Freq: Two times a day (BID) | CUTANEOUS | 0 refills | Status: DC

## 2019-10-25 NOTE — Assessment & Plan Note (Addendum)
-  She will attempt enema at home and self digital disimpaction. She was instructed to limit herself to 2 enemas. -If patient is unsuccessful or develops worsening abdominal pain, nausea, vomiting, unable to tolerate PO, will follow up in the Emergency Department or with Urgent Care. -After fecal impaction resolves, patient would benefit from Miralax Daily. She can start off taking 1 cap full daily and then increase or decrease the dose as needed to obtain one soft stool daily. -I also recommend this patient follow up with [place] in the clinic to make sure she is ok and healing well!

## 2019-10-25 NOTE — Telephone Encounter (Signed)
[facility] Telemedicine Visit  Patient consented to have virtual visit. Method of visit: Telephone  Encounter participants: Patient: Rhonda Cabrera - located at home Provider: Daisy Floro - located at [facility] Others (if applicable): None  Chief Complaint: rectal pain d/t hemorhoids  HPI: This is a pleasant patient who happens to be taking Suboxone chronically and suffers from chronic constipation associated with the medication.  The patient states she has hemorrhoids now that are bulging out and she feels as if there is a hard ball of stool in her rectum that she is unable to pass. She says she feels as if she is having a fecal impaction.  Her stomach hurts a little bed and she feels sick/nauseated. The patient is going to the pharmacy and is wondering if she is allowed to take an enema.  She states her belly does not feel more distended than usual.  She denies fevers, vomiting, and food intolerance. She has laxatives at home but is otherwise unsure what she should do.  ROS: per HPI  Pertinent PMHx: Constipation, on Suboxone  Exam:  Respiratory: Speaking in complete sentences, normal work of breathing  Assessment/Plan: Chronic constipation -She will attempt enema at home and self digital disimpaction. She was instructed to limit herself to 2 enemas. -If patient is unsuccessful or develops worsening abdominal pain, nausea, vomiting, unable to tolerate PO, will follow up in the Emergency Department or with Urgent Care. -After fecal impaction resolves, patient would benefit from Miralax Daily. She can start off taking 1 cap full daily and then increase or decrease the dose as needed to obtain one soft stool daily. -I also recommend this patient follow up with [place] in the clinic to make sure she is ok and healing well!   Time spent during visit with patient: 15 minutes  Milus Banister, [place], PGY-2 10/28/2019 11:01  AM

## 2019-11-21 ENCOUNTER — Ambulatory Visit (INDEPENDENT_AMBULATORY_CARE_PROVIDER_SITE_OTHER): Payer: Medicare Other | Admitting: Family Medicine

## 2019-11-21 ENCOUNTER — Other Ambulatory Visit (HOSPITAL_COMMUNITY)
Admission: RE | Admit: 2019-11-21 | Discharge: 2019-11-21 | Disposition: A | Discharge status: Home / Self Care | Payer: Medicare Other | Source: Ambulatory Visit | Attending: Family Medicine | Admitting: Family Medicine

## 2019-11-21 ENCOUNTER — Other Ambulatory Visit: Payer: Self-pay

## 2019-11-21 VITALS — BP 102/60 | HR 79

## 2019-11-21 DIAGNOSIS — R87612 Low grade squamous intraepithelial lesion on cytologic smear of cervix (LGSIL): Secondary | ICD-10-CM | POA: Diagnosis not present

## 2019-11-21 DIAGNOSIS — Z202 Contact with and (suspected) exposure to infections with a predominantly sexual mode of transmission: Secondary | ICD-10-CM | POA: Diagnosis present

## 2019-11-21 DIAGNOSIS — Z1151 Encounter for screening for human papillomavirus (HPV): Secondary | ICD-10-CM | POA: Diagnosis not present

## 2019-11-21 DIAGNOSIS — Z114 Encounter for screening for human immunodeficiency virus [HIV]: Secondary | ICD-10-CM | POA: Diagnosis not present

## 2019-11-21 DIAGNOSIS — F17200 Nicotine dependence, unspecified, uncomplicated: Secondary | ICD-10-CM | POA: Diagnosis not present

## 2019-11-21 DIAGNOSIS — R87619 Unspecified abnormal cytological findings in specimens from cervix uteri: Secondary | ICD-10-CM | POA: Insufficient documentation

## 2019-11-21 LAB — POCT WET PREP (WET MOUNT)
Clue Cells Wet Prep Whiff POC: NEGATIVE
Trichomonas Wet Prep HPF POC: ABSENT

## 2019-11-21 NOTE — Progress Notes (Signed)
  Patient Name: Rhonda Cabrera Date of Birth: December 03, 1986 Date of Visit: 11/21/19 PCP: Gifford Shave, MD  Chief Complaint: STD check and discuss Pap follow up   Subjective: Rhonda Cabrera is a pleasant 33 y.o. with medical history significant for tobacco abuse and abnormal Pap smear of the cervix  presenting today for STD check.  STD Check   Patient is sexually active with 2 partners. She had unprotected sex on Monday. Denies symptoms of STI. Does not think partner has infection. No discharge, pain, bleeding, or dysuria. Has bilateral tubal ligation.  Due for Pap Patient had a LSIL + HPV Pap in 2018. 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 Repeat Pap due in 2020 and 2021 (March) with cotesting. She has not had a repeat Pap since her cone.   Tobacco use Smokes 1/2 pack to 1 pack per day. Desires to quit. Reports motivator is her son with asthma. Only smokes outside. Not in car or house. Motivated on 7/10 to quit. 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 Has cigarette ~30 minutes after waking up.    ROS: Per HPI.   I have reviewed the patient's medical, surgical, family, and social history as appropriate.  Vitals:   11/21/19 1038  BP: 102/60  Pulse: 79  SpO2: 100%   Cardiac: Warm well perfused.  Capillary refill less than 3 seconds Respiratory breathing comfortably on room air Psych: Pleasant normal affect, appropriate, normal rate of speech GU Exam:  Chaperoned exam.  External exam: Normal-appearing female external genitalia.  Vaginal exam notable for normal rugae, Cervix with evidence of surgery, flat, no obvious lesion, Pap obtained. . n.  Bimanual exam reveals normal sized uterus no masses appreciated, no pain with examination.   Abnormal Pap smear of cervix History of cold knife cone in 2019, needs repeat pap at 12 and 24 months. Cotesting today.   TOBACCO  DEPENDENCE Motivational interviewing/listening provided. Rx for nicotine patches and gums. History of bipolar disorder, hesitant to use Wellbutrin at this time. Patient wishes to try patches as they worked for friend. Follow up in 1 month with PCP for further discussion.   At risk for STI, no indication for PREP therapy. Discussed safe sex practices. Recommended repeat HIV serology in 1 month given unprotected sex.   Return to care in 1 month for repeat HIV and discuss smoking cessation further. Rx sent to [facility] for patches and gums.   Dorris Singh, MD  Family Medicine Teaching Service

## 2019-11-21 NOTE — Assessment & Plan Note (Signed)
History of cold knife cone in 2019, needs repeat pap at 12 and 24 months. Cotesting today.

## 2019-11-21 NOTE — Patient Instructions (Signed)
Your pap smear will take a few days to run   I will call you with results   Please go to the lab today  You can go to the [facility] to pick up your nicotine patches and gums  Make a follow up in 1 months  Tobacco use is damaging to your body. It increases your risk of stroke, heart attack, lung cancer, and serious lung disease in the future. It also reduces your fertility.   Quitting tobacco is the best thing for your health but is a challenge---nicotine, a chemical in cigarettes, is highly addictive.   You can call 1 800 QUIT NOW (1-775-691-2638)---you will be connected with a Artist. They can also mail you nicotine gums, lozenges, and patches to quit.   Ask me about patches (which you wear all day) and gums (which you use when you have a craving) to help you quit.    Patch  Remove and replace the patch with a new one each morning to any non-hairy skin site; rotate the site daily to avoid skin irritation, the most common side effect. Over-the-counter topical hydrocortisone (1% cream or ointment) may be used to relieve skin irritation if it occurs.  If leaving the patch on overnight causes the frequently reported side effects of insomnia and vivid dreams, remove the patch at bedtime and replace with a new one the next morning.    Gum  Gum: Chew slowly until it tingles, then place gum between cheek and gum until tingle is gone; repeat process until most of tingle is gone (~30 minutes). Do not eat or drink 15 minutes before using or while the gum is in mouth.   It was wonderful to see you today.  Thank you for choosing [facility].   Please call [phone] with any questions about today's appointment.  Please be sure to schedule follow up at the front  desk before you leave today.   Dorris Singh, MD  Family Medicine

## 2019-11-21 NOTE — Assessment & Plan Note (Signed)
Motivational interviewing/listening provided. Rx for nicotine patches and gums. History of bipolar disorder, hesitant to use Wellbutrin at this time. Patient wishes to try patches as they worked for friend. Follow up in 1 month with PCP for further discussion.

## 2019-11-22 ENCOUNTER — Encounter: Payer: Self-pay | Admitting: Family Medicine

## 2019-11-22 LAB — HIV ANTIBODY (ROUTINE TESTING W REFLEX): HIV Screen 4th Generation wRfx: NONREACTIVE

## 2019-11-22 LAB — RPR: RPR Ser Ql: NONREACTIVE

## 2019-11-22 LAB — HEPATITIS B SURFACE ANTIGEN: Hepatitis B Surface Ag: NEGATIVE

## 2019-11-22 LAB — HCV COMMENT:

## 2019-11-22 LAB — HEPATITIS C ANTIBODY (REFLEX): HCV Ab: <0.1 s/co ratio (ref 0.0–0.9)

## 2019-11-24 ENCOUNTER — Telehealth: Payer: Self-pay | Admitting: Family Medicine

## 2019-11-24 LAB — CERVICOVAGINAL ANCILLARY ONLY
Chlamydia: NEGATIVE
Comment: NEGATIVE
Comment: NEGATIVE
Comment: NORMAL
Neisseria Gonorrhea: NEGATIVE
Trichomonas: NEGATIVE

## 2019-11-24 MED FILL — SM NICOTINE 2 MG CHEWING GU: 2 | 30 days supply | Qty: 110 | Fill #0

## 2019-11-24 MED FILL — NICOTINE 14 MG/24HR PATCH: 14 | 14 days supply | Qty: 14 | Fill #0

## 2019-11-24 NOTE — Telephone Encounter (Signed)
Called patient with results.  Rhonda Cabrera-- can you please fax another copy of Rx to [facility]? See MyChart message---patient reports pharmacy does not have Rx.   Thanks, CB

## 2019-11-24 NOTE — Telephone Encounter (Signed)
Verified with pharmacy rx was never received.   Rx refaxed and verified that pharmacy received.  Christen Bame, CMA

## 2019-11-27 LAB — CYTOLOGY - PAP
Adequacy: ABSENT
Comment: NEGATIVE
Diagnosis: NEGATIVE
High risk HPV: NEGATIVE

## 2019-12-20 ENCOUNTER — Telehealth: Payer: Self-pay | Admitting: Family Medicine

## 2019-12-20 MED ORDER — IBUPROFEN 800 MG PO TABS: 800.0000 mg | ORAL_TABLET | Freq: Three times a day (TID) | ORAL | 0 refills | Status: DC

## 2019-12-20 MED ORDER — FLUCONAZOLE 150 MG PO TABS: 150.0000 mg | ORAL_TABLET | Freq: Once | ORAL | 0 refills | Status: DC

## 2019-12-20 MED ORDER — AMOXICILLIN-POT CLAVULANATE 875-125 MG PO TABS: 1.0000 | ORAL_TABLET | Freq: Two times a day (BID) | ORAL | 0 refills | Status: DC

## 2019-12-20 MED ORDER — AMOXICILLIN-POT CLAVULANATE 875-125 MG PO TABS: 1.0000 | ORAL_TABLET | Freq: Two times a day (BID) | ORAL | 0 refills | Status: AC

## 2019-12-20 MED ORDER — FLUCONAZOLE 150 MG PO TABS: 150.0000 mg | ORAL_TABLET | Freq: Once | ORAL | 0 refills | Status: AC

## 2019-12-20 NOTE — Telephone Encounter (Signed)
Entered in error

## 2019-12-20 NOTE — Telephone Encounter (Signed)
FPTS After Hours Note  Ms. Rhonda Cabrera reports that she is having significant pain in her right bottom molar tooth for last 3 days which has become excruciating.  She thinks that there is a hole in her tooth that is causing the problem.  She has a dental appointment on January 10 but does not think that she can wait that long for some kind of intervention for her pain.  She has tried Orajel and Aleve for her pain which have been minimally effective.  Discussed with patient that she should call her dental office on Monday and see if they can move her appointment earlier, and she was in agreement with this.  I will send in Augmentin 875-125 mg twice daily for 7 days for treatment of her tooth infection.  Since patient reports that she usually gets vaginal yeast infections when she takes antibiotics, will send in fluconazole empirically as well.  She says that she has had a bilateral tubal ligation so denies chance that she could be pregnant.  Counseled patient to take Tylenol and ibuprofen and sent in ibuprofen 800 mg.  I told her to take ibuprofen with food.  I also said she can take Aleve, but she should not take both ibuprofen and Aleve.  She was in agreement with this plan.    Will forward to patient's PCP as an FYI.  Ishaan Villamar C. Shan Levans, MD PGY-3, [facility] 12/20/2019 8:47 PM

## 2019-12-29 ENCOUNTER — Telehealth: Payer: Self-pay

## 2019-12-29 NOTE — Telephone Encounter (Signed)
Pt calls nurse line requesting rx for 21mg  nicotine patch. Per pt due to amount she currently smokes she needs the increased dosage to start off.   To PCP  Talbot Grumbling, RN

## 2019-12-30 NOTE — Telephone Encounter (Signed)
New Rx  for 21 mg patch given to Glori Bickers to fax to pharmacy.  Dorris Singh, MD  Family Medicine Teaching Service

## 2019-12-30 NOTE — Telephone Encounter (Signed)
Patient calls nurse line to check status of nicotine patches. Spoke with Owens Shark, she will send in desired patches for patient. Patient has an apt scheduled for smoking cessation for Thursday with Koval.

## 2019-12-31 MED FILL — NICOTINE 21 MG/24HR PATCH: 21 | 14 days supply | Qty: 14 | Fill #0

## 2020-01-01 ENCOUNTER — Other Ambulatory Visit: Payer: Self-pay

## 2020-01-01 ENCOUNTER — Encounter: Payer: Self-pay | Admitting: Pharmacist

## 2020-01-01 ENCOUNTER — Telehealth (INDEPENDENT_AMBULATORY_CARE_PROVIDER_SITE_OTHER): Payer: Medicare Other | Admitting: Pharmacist

## 2020-01-01 DIAGNOSIS — F17210 Nicotine dependence, cigarettes, uncomplicated: Secondary | ICD-10-CM

## 2020-01-01 DIAGNOSIS — F17200 Nicotine dependence, unspecified, uncomplicated: Secondary | ICD-10-CM

## 2020-01-01 NOTE — Progress Notes (Signed)
   S:  Patient contacted via telephone for evaluation/assistance with tobacco dependence.   Patient was referred and last seen by Dr. Owens Shark, on 11/21/2019.   Patient reported that she is using both nicotine patch 14mg  and nicotine gum 2mg .  Reports some vivid dreams likely due to the patch being worn at night  Started smoking at age of 63. Smoked 1-2 packs per day of "long cigarettes" - [place].  Most recent quit attempt - reports totally smoke free for 4 days.  Longest time ever been tobacco free 2 days.  Medications used in past cessation efforts include: patch a few years ago from the [facility] Quit line  Most common triggers to use tobacco include; after meals, with stress and first thing in the morning.  Motivation to quit: live longer and be healthier for her children.  Patient reports that her husband and aunt are also currently attempting smoking cessation.    A/P: Tobacco use disorder with moderate nicotine dependence of 20 years duration in a patient who is good candidate for success because of current quit of 4 days (complete abstinence) with use of 14mg  patch and 2mg  gum. We discussed a variety of support strategies and agreed to continue therapy at current dose without change.  We adjusted the dosing of nicotine patch to QAM and OFF at QHS to avoid/minimize nightmares, vivid dreams and insomnia.  We also encouraged increased use (more pieces) of nicotine throughout the day.  Recommended increase from 4-6 pieces per day to 6-8 pieces per day.  Reviewed proper use, and potential adverse effects of both patch and gum.  Patient reports use of 1 800-QUIT NOW support program and will contact them again after we finish today.  Consider possible initiation of nortriptyline low dose (25mg  QHS if insomnia persists) at next contact. Total time in telephone interactive counseling was 47 minutes.  Visit conducted with Drexel Iha, PhamD, PGY-2 resident F/U phone call in 1 week.

## 2020-01-01 NOTE — Progress Notes (Signed)
Noted and agree. 

## 2020-01-01 NOTE — Patient Instructions (Signed)
Congratulations on success with quitting completely for four days!  Continue Nicotine patch 14mg  patch apply each morning and take off at bedtime.  Continue Nicotine gum 2mg  - increase frequency of use - total of 6-8 pieces per day.   Follow with phone in 1 week.

## 2020-01-01 NOTE — Assessment & Plan Note (Signed)
Tobacco use disorder with moderate nicotine dependence of 20 years duration in a patient who is good candidate for success because of current quit of 4 days (complete abstinence) with use of 14mg  patch and 2mg  gum. We discussed a variety of support strategies and agreed to continue therapy at current dose without change.  We adjusted the dosing of nicotine patch to QAM and OFF at QHS to avoid/minimize nightmares, vivid dreams and insomnia.  We also encouraged increased use (more pieces) of nicotine throughout the day.  Recommended increase from 4-6 pieces per day to 6-8 pieces per day.  Reviewed proper use, and potential adverse effects of both patch and gum.  Patient reports use of 1 800-QUIT NOW support program and will contact them again after we finish today.  Consider possible initiation of nortriptyline low dose (25mg  QHS if insomnia persists) at next contact. Total time in telephone interactive counseling was 47 minutes.  Visit conducted with Drexel Iha, PhamD, PGY-2 resident F/U phone call in 1 week.

## 2020-01-03 ENCOUNTER — Telehealth: Payer: Self-pay | Admitting: Family Medicine

## 2020-01-03 MED ORDER — NICOTINE 14 MG/24HR TD PT24: 14.0000 mg | MEDICATED_PATCH | Freq: Every day | TRANSDERMAL | 0 refills | Status: DC

## 2020-01-03 NOTE — Telephone Encounter (Signed)
Patient called to say that she was going to pick up her nicotine patch prescription and that her pharmacy was closed.  She is asking it be sent to an alternative pharmacy that she can get it this weekend.  Patient is trying very very hard to quit smoking.  She has been without cigarettes for close to a week now.  Encourage patient to continue to try.  Will refill nicotine patches and sent to her requested pharmacy.

## 2020-01-03 NOTE — Telephone Encounter (Signed)
Received a call on after hours pager.  Patient was called back, no answer.  Voicemail left to call back or go to emergency department if in experiencing any any emergencies.

## 2020-01-07 MED FILL — NICOTINE 14 MG/24HR PATCH: 14 | 14 days supply | Qty: 14 | Fill #1

## 2020-01-08 ENCOUNTER — Telehealth: Payer: Self-pay | Admitting: Pharmacist

## 2020-01-08 DIAGNOSIS — F17200 Nicotine dependence, unspecified, uncomplicated: Secondary | ICD-10-CM

## 2020-01-08 NOTE — Telephone Encounter (Signed)
Noted and agree. 

## 2020-01-08 NOTE — Telephone Encounter (Signed)
Contacted patient RE tobacco cessation.  Patient reported relapse with smoking ~ 1 half pack of cigarettes over the last 24 hours.   She shared her relapse triggers and has a plan for quitting tomorrow 01/09/2020.   She has contacted the [place] quitline and requested 21mg  patches.   She will also try to use more GUM - up to 8 pieces PRN craving.   Her recent flare of endometriosis has had intense pain. She had a supply of muscle relaxants but that supply is exhausted.  She did not desire any refill of methocarbamol which she has used with success in the past.   She is considering use of OCPs if she can quit smoking.   Plan to restart 21mg  patch and gum tomorrow 01/09/2020  Phone F/U in 1 week.

## 2020-01-08 NOTE — Assessment & Plan Note (Signed)
Contacted patient RE tobacco cessation.  Patient reported relapse with smoking ~ 1 half pack of cigarettes over the last 24 hours.   She shared her relapse triggers and has a plan for quitting tomorrow 01/09/2020.   She has contacted the [place] quitline and requested 21mg  patches.   She will also try to use more GUM - up to 8 pieces PRN craving.   Her recent flare of endometriosis has had intense pain. She had a supply of muscle relaxants but that supply is exhausted.  She did not desire any refill of methocarbamol which she has used with success in the past.   She is considering use of OCPs if she can quit smoking.   Plan to restart 21mg  patch and gum tomorrow 01/09/2020  Phone F/U in 1 week.

## 2020-01-16 IMAGING — DX ABDOMEN - 1 VIEW
1 series · 1 of 1 positions shown · non-contrast
Comparison: CT abdomen and pelvis 02/01/2019

CLINICAL DATA: Sharp abdominal pain this morning

EXAM:
ABDOMEN - 1 VIEW

[abdomen kub]
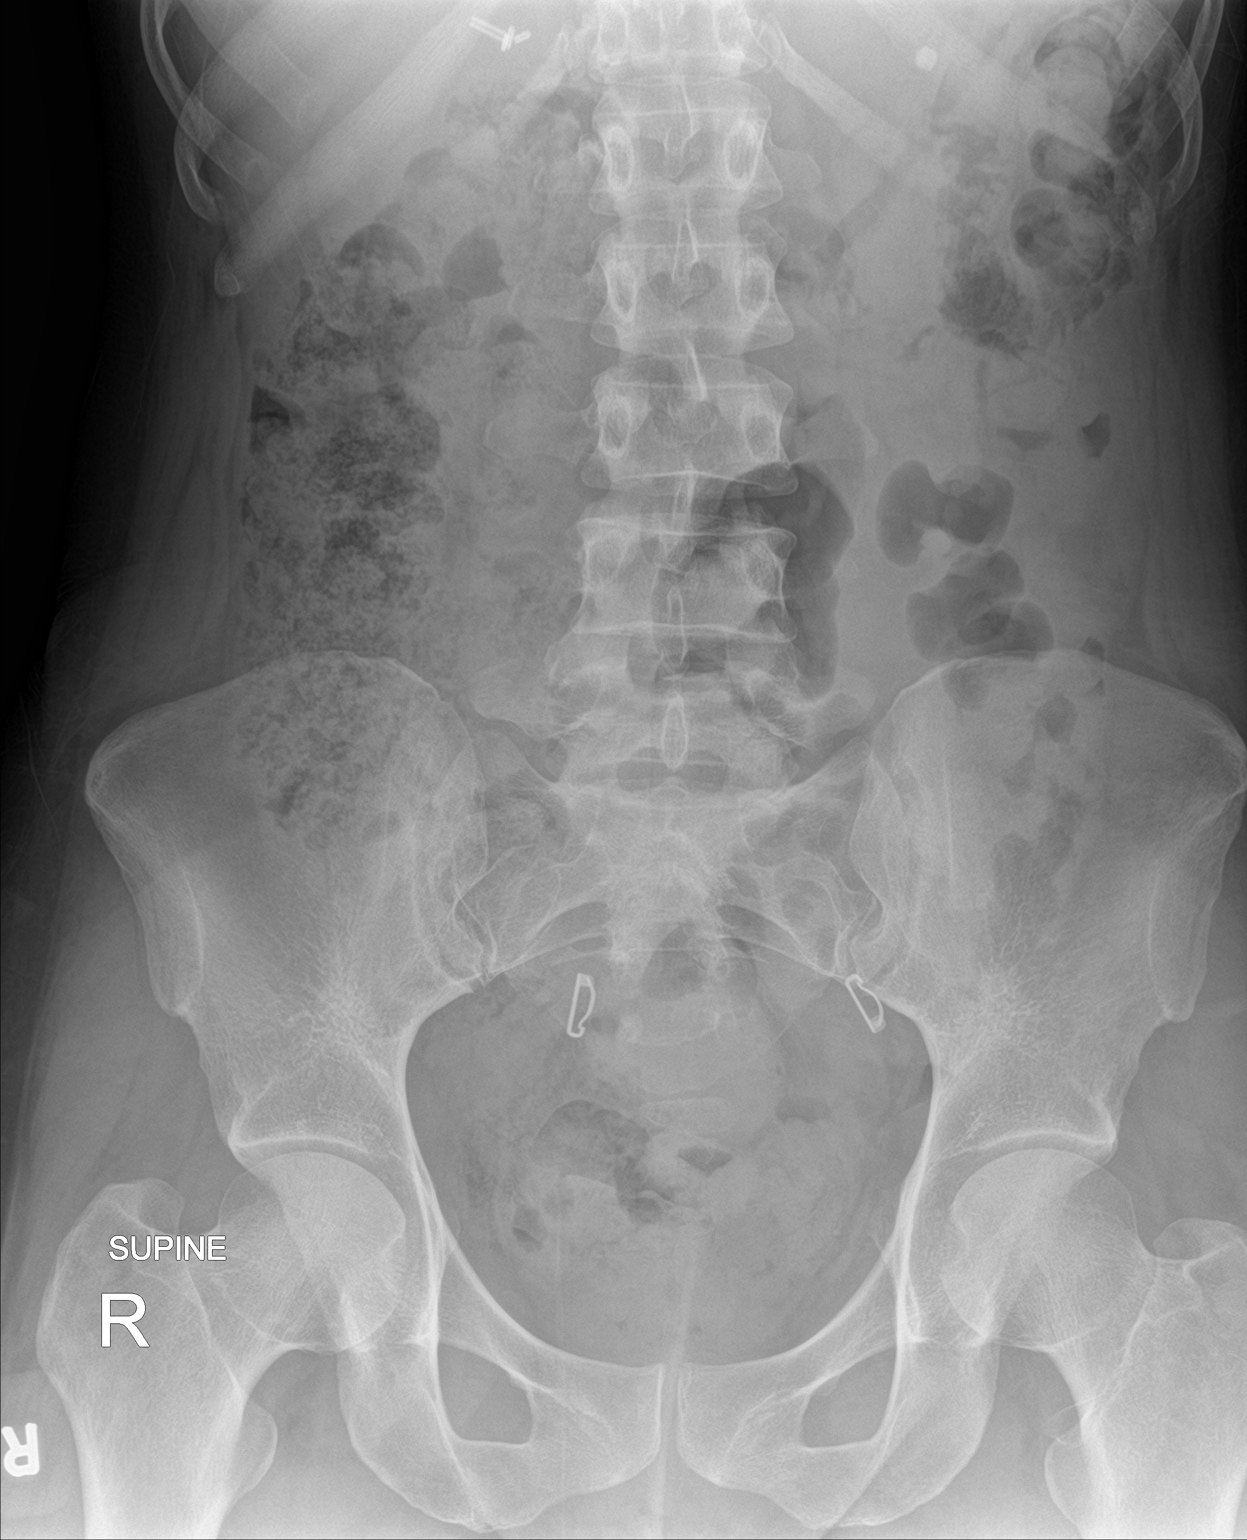

[1 of 1 positions shown; findings below may reference images not displayed]

FINDINGS: Increased stool throughout colon particularly RIGHT colon and
rectum.

No bowel obstruction or bowel wall thickening.

Surgical clips RIGHT upper quadrant consistent with cholecystectomy.

7 mm calculus upper pole LEFT kidney.

Tubal ligation clips in pelvis.

Bones unremarkable.
IMPRESSION: Increased stool in colon as above.

7 mm LEFT renal calculus.

## 2020-01-23 ENCOUNTER — Telehealth: Payer: Self-pay | Admitting: Pharmacist

## 2020-01-23 DIAGNOSIS — F17200 Nicotine dependence, unspecified, uncomplicated: Secondary | ICD-10-CM

## 2020-01-23 NOTE — Assessment & Plan Note (Signed)
Contacted patient RE tobacco cessation attempt.  She reports relapsing to 0.5 PPD (less than previous 1-1+ PPD).  She remains interested in quitting completely at this time.  She notes that she and her husband are currently "taking a break" from living together.  He is also a smoker and she feels he is a source of relapse.    Willing to set quit date - Monday 01/26/2020 - she will stretch current supply of cigarettes until Monday AM.  She plans to use nicotine patches and gum.  She also plans to use the [place] quitline.  I plan to call her in 6-7 days to assess progress and support quit attempt.

## 2020-01-23 NOTE — Telephone Encounter (Signed)
Contacted patient RE tobacco cessation attempt.  She reports relapsing to 0.5 PPD (less than previous 1-1+ PPD).  She remains interested in quitting completely at this time.  She notes that she and her husband are currently "taking a break" from living together.  He is also a smoker and she feels he is a source of relapse.    Willing to set quit date - Monday 01/26/2020 - she will stretch current supply of cigarettes until Monday AM.  She plans to use nicotine patches and gum.  She also plans to use the [place] quitline.  I plan to call her in 6-7 days to assess progress and support quit attempt.

## 2020-01-23 NOTE — Telephone Encounter (Signed)
-----   Message from Leavy Cella, [facility] sent at 01/08/2020  3:03 PM EST ----- Regarding: tobacco cessation Re QUIT date 01/09/2020

## 2020-01-23 NOTE — Telephone Encounter (Signed)
Noted and agree. 

## 2020-01-26 ENCOUNTER — Encounter: Payer: Self-pay | Admitting: Obstetrics and Gynecology

## 2020-01-26 ENCOUNTER — Other Ambulatory Visit: Payer: Self-pay

## 2020-01-26 ENCOUNTER — Ambulatory Visit (INDEPENDENT_AMBULATORY_CARE_PROVIDER_SITE_OTHER): Payer: Medicare Other | Admitting: Obstetrics and Gynecology

## 2020-01-26 VITALS — BP 111/68 | HR 68 | Ht 67.5 in | Wt 154.6 lb

## 2020-01-26 DIAGNOSIS — R102 Pelvic and perineal pain: Secondary | ICD-10-CM

## 2020-01-26 MED ORDER — SLYND 4 MG PO TABS: 1.0000 | ORAL_TABLET | Freq: Every day | ORAL | 4 refills | Status: DC

## 2020-01-26 NOTE — Progress Notes (Signed)
Pt complains of regular cycles with spotting in-between, pt states that she has been having extremely painful cramps rating 10/10. Pt also reports clots with cycles. LMP 01-26-20. Last pap 11-21-19.

## 2020-01-26 NOTE — Progress Notes (Signed)
34 yo P6 with laparoscopic diagnosis of endometriosis presenting today for the evaluation of persistent dysmenorrhea. Patient reports a regular cycle heavy in flow with passage of clots associated with cramping pain not relieved by ibuprofen. Patient was previously offered contraceptive pills for further management of her endometriosis but she did not take it. Patient is sexually active using BTL for contraception. She denies any abnormal discharge  Past Medical History:  Diagnosis Date  . Abnormal Pap smear   . Anemia   . Anxiety   . Asthma   . Bipolar 1 disorder ([place])   . Chlamydia 07/12/2012  . Depression   . Family history of adverse reaction to anesthesia   . Headache    [place]  . Hypertension    with pregnancy only  . Opiate addiction ([place])   . Pneumonia 2017   used inhaler   . Polysubstance abuse ([place])   . PONV (postoperative nausea and vomiting)    SEVERE ITCHING AFTER EPIDURAL   Past Surgical History:  Procedure Laterality Date  . CERVICAL CONIZATION W/BX N/A 02/19/2018   Procedure: CONIZATION CERVIX WITH BIOPSY - COLD KNIFE;  Surgeon: [name], MD;  Location: [facility];  Service: Gynecology;  Laterality: N/A;  . CHOLECYSTECTOMY  11/04/2012   Procedure: LAPAROSCOPIC CHOLECYSTECTOMY WITH INTRAOPERATIVE CHOLANGIOGRAM;  Surgeon: Imogene Burn. Georgette Dover, MD;  Location: [facility];  Service: General;  Laterality: N/A;  . LAPAROSCOPY N/A 02/19/2018   Procedure: LAPAROSCOPY DIAGNOSTIC WITH PERITONEAL BIOPSIES;  Surgeon: [name], MD;  Location: [facility];  Service: Gynecology;  Laterality: N/A;  . TUBAL LIGATION  09/08/2011   Procedure: POST PARTUM TUBAL LIGATION;  Surgeon: Emeterio Reeve, MD;  Location: [facility];  Service: Gynecology;  Laterality: Bilateral;  . vaginal deliveries     Family History  Problem Relation Age of Onset  . Drug abuse Mother   . Drug abuse Father   . Diabetes Maternal Grandmother   . Hypertension Maternal Grandmother   . Diabetes Paternal Grandmother   . Diabetes  Paternal Grandfather   . Breast cancer Paternal Aunt    Social History   Tobacco Use  . Smoking status: Current Every Day Smoker    Packs/day: 0.50    Years: 12.00    Pack years: 6.00    Types: Cigarettes  . Smokeless tobacco: Never Used  . Tobacco comment: vaped for 1 month  Substance Use Topics  . Alcohol use: No    Alcohol/week: 0.0 standard drinks  . Drug use: Yes    Frequency: 7.0 times per week    Types: Oxycodone, Marijuana    Comment:  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   ROS See pertinent in HPI Blood pressure 111/68, pulse 68, height 5' 7.5" (1.715 m), weight 154 lb 9.6 oz (70.1 kg), last menstrual period 01/26/2020. GENERAL: Well-developed, well-nourished female in no acute distress.  ABDOMEN: Soft, nontender, nondistended. No organomegaly. PELVIC: Normal external female genitalia. Vagina is pink and rugated.  Normal discharge. Normal appearing cervix. Uterus is normal in size.  No adnexal mass or tenderness. EXTREMITIES: No cyanosis, clubbing, or edema, 2+ distal pulses.  A/P 34 yo with endometriosis and pelvic pain - Discussed medical management with contraception. Rx POP provided - Pelvic ultrasound ordered to rule out any other etiologies - Patient will be contacted with any abnormal results - Patient declined vaginal cultures

## 2020-02-03 ENCOUNTER — Ambulatory Visit (HOSPITAL_COMMUNITY)
Admission: RE | Admit: 2020-02-03 | Discharge: 2020-02-03 | Disposition: A | Discharge status: Home / Self Care | Payer: Medicare Other | Source: Ambulatory Visit | Attending: Obstetrics and Gynecology | Admitting: Obstetrics and Gynecology

## 2020-02-03 ENCOUNTER — Other Ambulatory Visit: Payer: Self-pay

## 2020-02-03 DIAGNOSIS — R102 Pelvic and perineal pain: Secondary | ICD-10-CM | POA: Diagnosis present

## 2020-02-17 IMAGING — CR CHEST - 2 VIEW
2 series · 2 of 2 positions shown · non-contrast
Comparison: 05/28/2019

CLINICAL DATA: Palpitation

EXAM:
CHEST - 2 VIEW

[w chest pa]
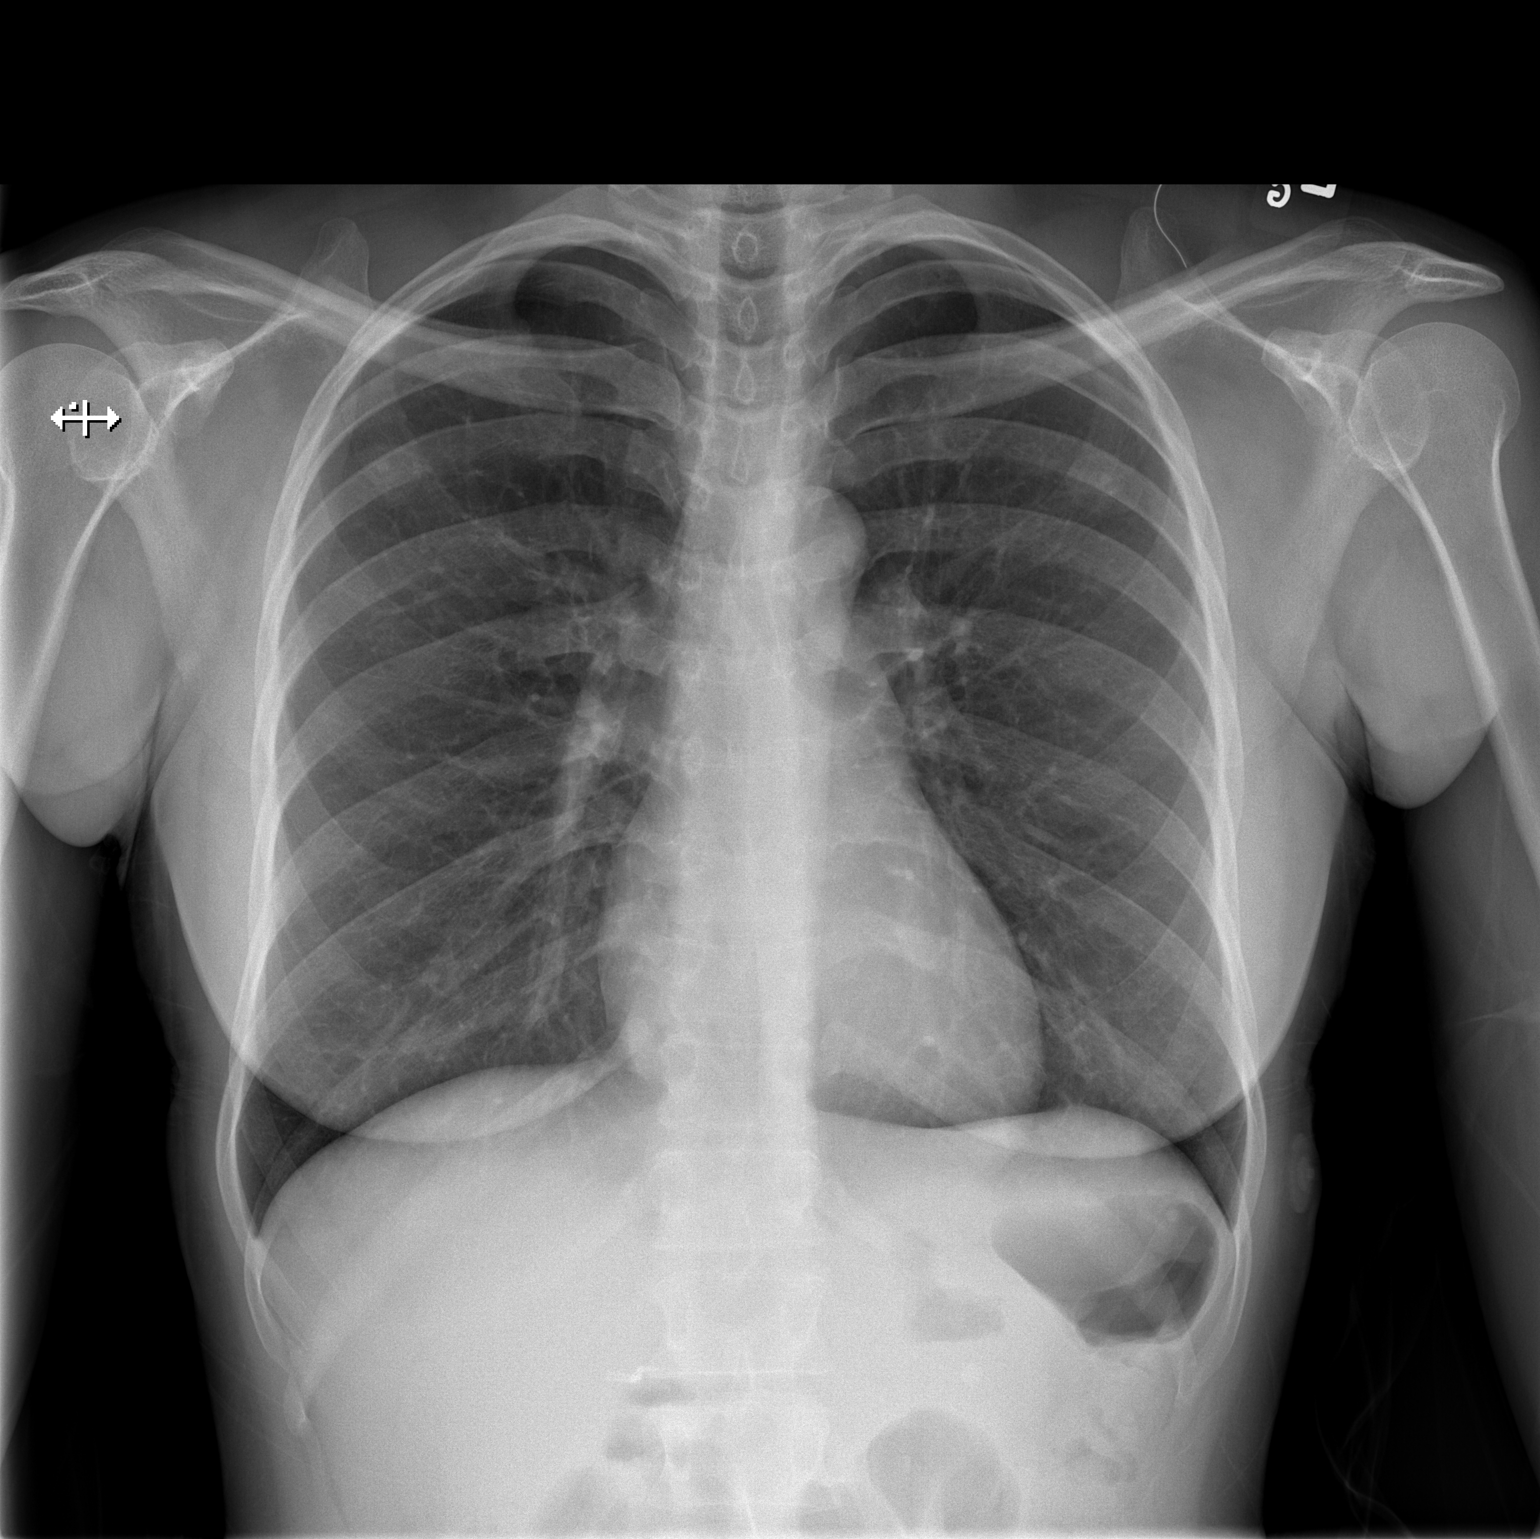

[w chest lat]
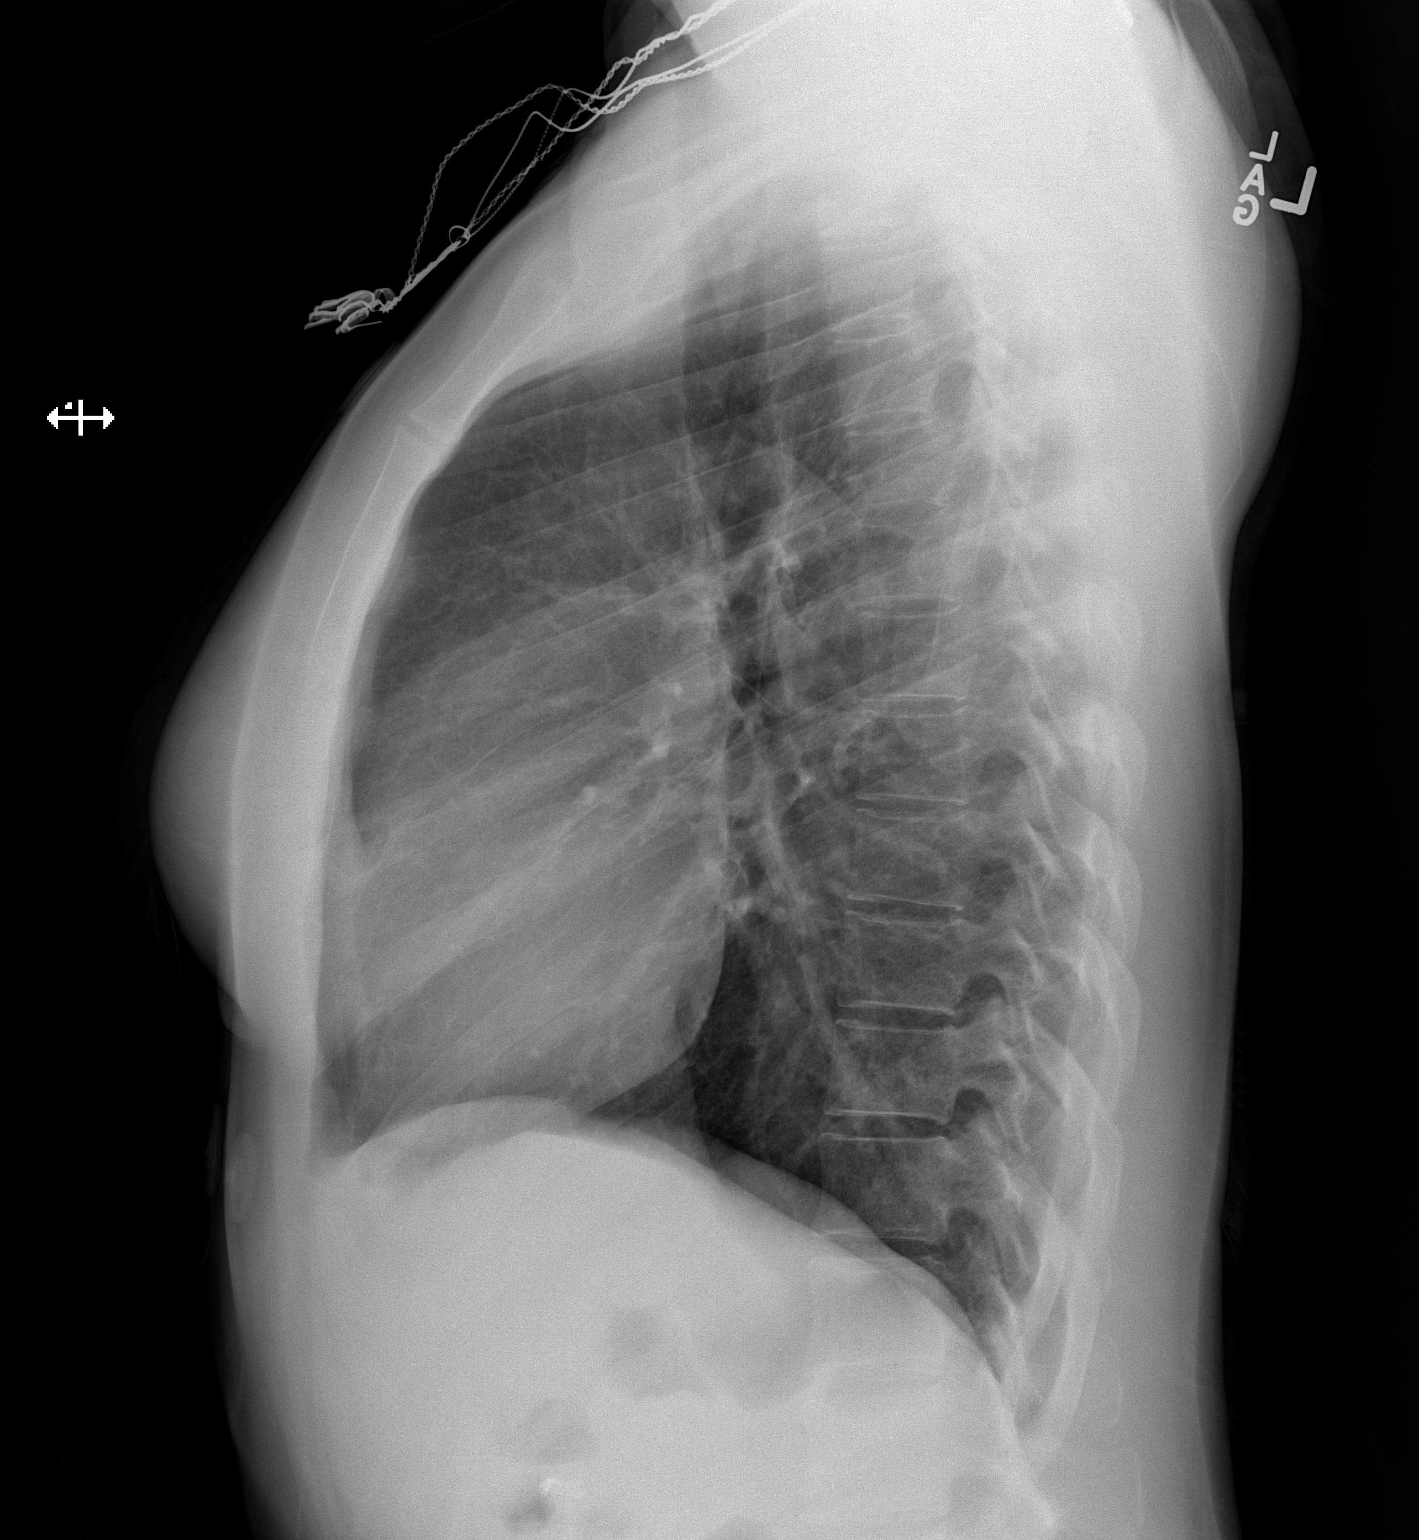

[2 of 2 positions shown; findings below may reference images not displayed]

FINDINGS: Normal heart size and mediastinal contours. Artifact from EKG leads.
No acute infiltrate or edema. No effusion or pneumothorax. No acute
osseous findings. Cholecystectomy clips.
IMPRESSION: Negative chest.

## 2020-02-18 ENCOUNTER — Telehealth: Payer: Self-pay | Admitting: Pharmacist

## 2020-02-18 DIAGNOSIS — F17200 Nicotine dependence, unspecified, uncomplicated: Secondary | ICD-10-CM

## 2020-02-18 NOTE — Assessment & Plan Note (Signed)
Contacted patient RE tobacco cessation.   Patient continues to smoke and regrets smoking on a daily basis.  She is willing to reinitiate patches and gum and attempt a quit date in 2 days.    She requested phone follow-up on Monday 3/8  Encouraged and supported her plan.

## 2020-02-18 NOTE — Telephone Encounter (Signed)
Contacted patient RE tobacco cessation.   Patient continues to smoke and regrets smoking on a daily basis.  She is willing to reinitiate patches and gum and attempt a quit date in 2 days.    She requested phone follow-up on Monday 3/8  Encouraged and supported her plan.

## 2020-02-18 NOTE — Telephone Encounter (Signed)
Reviewed and agree.

## 2020-02-23 ENCOUNTER — Telehealth: Payer: Self-pay | Admitting: Pharmacist

## 2020-02-23 NOTE — Telephone Encounter (Signed)
Attempted contact ~ quit date plan of 3/5  Left message requesting call back - direct office line provided.   If I do not hear from patient plan was that I would attempt another call later in the week 3-5 days.

## 2020-02-26 ENCOUNTER — Telehealth: Payer: Self-pay

## 2020-02-26 ENCOUNTER — Encounter: Payer: Self-pay | Admitting: Family Medicine

## 2020-02-26 NOTE — Telephone Encounter (Signed)
Pt calling to let Dr. Caron Presume know that she has another UTI. Had dip test done at her saboxone injection?  Pt would like a Rx for antibiotic and for a yeast infection she will get from antibiotic. Please call [phone]. Ottis Stain, CMA

## 2020-02-27 ENCOUNTER — Other Ambulatory Visit: Payer: Self-pay

## 2020-02-27 ENCOUNTER — Telehealth (INDEPENDENT_AMBULATORY_CARE_PROVIDER_SITE_OTHER): Payer: Medicare Other | Admitting: Family Medicine

## 2020-02-27 ENCOUNTER — Telehealth: Payer: Medicare Other

## 2020-02-27 DIAGNOSIS — N3000 Acute cystitis without hematuria: Secondary | ICD-10-CM | POA: Diagnosis not present

## 2020-02-27 MED ORDER — CEPHALEXIN 500 MG PO CAPS: 500.0000 mg | ORAL_CAPSULE | Freq: Four times a day (QID) | ORAL | 0 refills | Status: AC

## 2020-02-27 MED ORDER — FLUCONAZOLE 150 MG PO TABS: 150.0000 mg | ORAL_TABLET | Freq: Once | ORAL | 0 refills | Status: AC

## 2020-02-27 NOTE — Telephone Encounter (Signed)
Called patient to discuss her urinary symptoms.  She informed me that she had a telemedicine visit with Dr. Ouida Sills today and was prescribed some antibiotics.  No other concerns at this time.  If she has continuation of symptoms with no improvement with antibiotics she will call the clinic to schedule a follow-up visit.

## 2020-02-27 NOTE — Progress Notes (Signed)
[place] Telemedicine Visit  Patient consented to have virtual visit. Method of visit: Video  Encounter participants: Patient: Rhonda Cabrera - located at [facility] [phone] Provider: Daisy Floro - located at [facility] Others (if applicable): None  Chief Complaint: UTI symptoms  HPI: Yesterday (3/11) patient was evaluated at her pain management clinic when she reported that she was having symptoms concerning for a UTI. They checked a UA and said her "test indicated a UTI" and that she should get it touch with her PCP. She reports a few days of having minimal pain with urination, minor back pain, strong smell to her urine, some urinary frequency and urgency. She denies vaginal discharge, blood in urine, fevers and body aches.   ROS: per HPI  Pertinent PMHx:  Recurrent UTI (2016)  Exam:  Respiratory: speaking in complete sentences, does not appear distressed, nontoxic appearing  Assessment/Plan: Urinary tract infection without hematuria Last 2 urine cultures (Jan and Feb 2020) growing pan-sensitive E. Coli,  - Keflex 500mg  qid x 5 - Patient to follow up next week if symptoms not improved  - Strict return precautions given   Time spent during visit with patient: 12 minutes  Milus Banister, [place], PGY-2 02/27/2020 3:02 PM

## 2020-02-27 NOTE — Assessment & Plan Note (Signed)
Last 2 urine cultures (Jan and Feb 2020) growing pan-sensitive E. Coli,  - Keflex 500mg  qid x 5 - Patient to follow up next week if symptoms not improved  - Strict return precautions given

## 2020-03-01 ENCOUNTER — Telehealth: Payer: Self-pay | Admitting: Pharmacist

## 2020-03-01 NOTE — Telephone Encounter (Signed)
Attempted phone contact for tobacco cessation - left message that patient could call back directly OR I would try again next week.

## 2020-03-18 ENCOUNTER — Ambulatory Visit: Payer: Medicare Other

## 2020-03-18 ENCOUNTER — Telehealth: Payer: Self-pay | Admitting: Pharmacist

## 2020-03-18 NOTE — Telephone Encounter (Signed)
Unable to reach - mailbox is full.   Plan 1 additional contact in 2 weeks.

## 2020-03-28 ENCOUNTER — Telehealth: Payer: Self-pay | Admitting: Family Medicine

## 2020-03-28 DIAGNOSIS — N809 Endometriosis, unspecified: Secondary | ICD-10-CM

## 2020-03-28 MED ORDER — METHOCARBAMOL 500 MG PO TABS: 500.0000 mg | ORAL_TABLET | Freq: Three times a day (TID) | ORAL | 0 refills | Status: DC | PRN

## 2020-03-28 NOTE — Telephone Encounter (Signed)
**  After hours emergency line call**  Rhonda Cabrera is a 34 year old female with a history of laparoscopically diagnosed endometriosis calling into the after-hours line due to intolerable pelvic pain.  States her pelvic pain is typical of her endometriosis flares, currently on her menstrual cycle.  Unfortunately she has worsening pain exacerbated by coffee and has drank more than usual in the past week.  Low concern for pregnancy, s/p tubal ligation.  She previously has had Robaxin at home which provides significant relief from her uterine cramping, but does not have any more.  She has been using a heating pad, ibuprofen 800 mg, and leftover tramadol/tylenol at home with little relief in the pain.  Discussed in general that this is a triage line, however with current COVID pandemic in efforts to avoid an unnecessary urgent care/ED visit will send in a small course of Robaxin to get her through until tomorrow.  Scheduled her for virtual appointment at 10:30 AM with Dr. Tammi Klippel to discuss this further.  Recommended continued use of ibuprofen/Tylenol, heating pads.  Also encouraged her to continue follow-up with OB/GYN and consider using POPs (she has been nervous to try birth control due to blood clots as she is a current smoker, discussed lessened risk with progesterone only)  Will route to PCP and Dr. Tammi Klippel who she is scheduled with tomorrow, 4/12.  Patriciaann Clan, DO

## 2020-03-29 ENCOUNTER — Telehealth (INDEPENDENT_AMBULATORY_CARE_PROVIDER_SITE_OTHER): Payer: Medicare Other | Admitting: Family Medicine

## 2020-03-29 ENCOUNTER — Other Ambulatory Visit: Payer: Medicare Other

## 2020-03-29 ENCOUNTER — Other Ambulatory Visit: Payer: Self-pay

## 2020-03-29 DIAGNOSIS — N809 Endometriosis, unspecified: Secondary | ICD-10-CM | POA: Diagnosis not present

## 2020-03-29 DIAGNOSIS — F17200 Nicotine dependence, unspecified, uncomplicated: Secondary | ICD-10-CM

## 2020-03-29 MED ORDER — METHOCARBAMOL 500 MG PO TABS: 500.0000 mg | ORAL_TABLET | Freq: Three times a day (TID) | ORAL | 0 refills | Status: DC | PRN

## 2020-03-29 NOTE — Assessment & Plan Note (Addendum)
Patient with history of endometriosis which is causing her severe pain.  Recent visit with Dr. Elly Modena in February 2021.  At that time pelvic exam appeared to be normal.  Was recommended contraception with POPs and they were provided but patient does not seem to be taking them.  Pelvic ultrasound was also obtained showing no acute findings.  Robaxin seems to be the only pain management that helps patient.  She is trying to avoid narcotics for previous substance use.  Is on Suboxone for this.  I confirmed with Dr.Koval that Robaxin will not interact with Suboxone.  We will give 30 pills and patient has follow-up with PCP in 2 weeks.  Patient advised to continue follow-up with OB/GYN as well.  Consider hormonal contraception in the future.  Patient is still a smoker but POPs may be reasonable.  Follow-up with PCP in 2 weeks so we can perform physical exam.

## 2020-03-29 NOTE — Progress Notes (Signed)
[place] Telemedicine Visit  Patient consented to have virtual visit and was identified by name and date of birth. Method of visit: Video  Encounter participants: Patient: Rhonda Cabrera - located at Home Provider: Caroline More - located at [facility] Others (if applicable): None  Chief Complaint: endometriosis   HPI:  Endometriosis Patient with after hours call to Dr. Higinio Plan for endometriosis. States that she is having pelvic pain and flares.  Scheduled for access to care visit with me today to discuss this.  Patient reports worsening pain. Had surgery in 2018 when she was diagnosed. H/o abnormal paps and precancerous cells. Has been doing well x1 year or more. Pain started to come back and has been worse.   At the beginning of pandemic and had chest pain. Went to ED and was given robaxin, but did not take it. Cardiac w/u has been negative. After that her pelvic pain was worse and she had robaxin available. This provided some pain. Uses it during the first 3 days of menses which help. Coffee makes the pain worse. Last month she ran out of robaxin. Didn't drink coffee and was not as back. Is drinking 3-4C of coffee a day and noticed pain is worse now. Pain is "excrutiating". Took tramadol with tylenol as well as 800mg  ibuprofen without help.   Started smoking again due to cramps. Was told by Dr. Caron Presume she could have more robaxin if needed. Yesterday was in tears 2/2 pain.   Does see [facility]. Had recent US on 01/2020 which was negative.   Has had 6 children and not interested in any more. Wants a hysterectomy. Was told this would not correct the problem. H/o tubal. Is against birth control due to smoking history. Is very fearful of blood clots. Is trying not to depend on medications. Tried green and mint tea and organic honey. Patient is compliant on suboxone and only takes robaxin as needed.   ROS: per HPI  Pertinent PMHx: tobacco  dependence, ASCUS, opiate addiction   Exam:  There were no vitals taken for this visit.  Respiratory: speaking full sentences, no increased WOB  Assessment/Plan:  Endometriosis Patient with history of endometriosis which is causing her severe pain.  Recent visit with Dr. Elly Modena in February 2021.  At that time pelvic exam appeared to be normal.  Was recommended contraception with POPs and they were provided but patient does not seem to be taking them.  Pelvic ultrasound was also obtained showing no acute findings.  Robaxin seems to be the only pain management that helps patient.  She is trying to avoid narcotics for previous substance use.  Is on Suboxone for this.  I confirmed with Dr.Koval that Robaxin will not interact with Suboxone.  We will give 30 pills and patient has follow-up with PCP in 2 weeks.  Patient advised to continue follow-up with OB/GYN as well.  Consider hormonal contraception in the future.  Patient is still a smoker but POPs may be reasonable.  Follow-up with PCP in 2 weeks so we can perform physical exam.  TOBACCO DEPENDENCE Patient reports going back to smoking given pain from endometriosis.  Encourage cessation.  Follow-up with PCP.    Time spent during visit with patient: 15 minutes

## 2020-03-29 NOTE — Assessment & Plan Note (Signed)
Patient reports going back to smoking given pain from endometriosis.  Encourage cessation.  Follow-up with PCP.

## 2020-04-01 ENCOUNTER — Telehealth: Payer: Self-pay | Admitting: Pharmacist

## 2020-04-01 DIAGNOSIS — F17200 Nicotine dependence, unspecified, uncomplicated: Secondary | ICD-10-CM

## 2020-04-01 NOTE — Assessment & Plan Note (Signed)
Contacted patient RE tobacco cessation.   Patient reports continued smoking of ~ 12 cigarettes.  She remains interested in attempting to quit.    She plans a beach trip this weekend and believes quitting smoking at a time when she is anticipating alcohol ingestion is unlikely to be successful.   She is willing to start nicotine patches on the way home from the beach.  She will also use nicotine gum - prn.     We discussed use of cigarettes with patch and she was happy to gain insight on need to reduce cigarettes (to use of 1-2 cigarettes per day) was acceptable  but that complete abstinence was our goal eventually.  Progress of reducing cigarette intake to minimal amount with patches and gum was goal for next week.    I will plan to contact her again in 1 week.

## 2020-04-01 NOTE — Telephone Encounter (Signed)
Contacted patient RE tobacco cessation.   Patient reports continued smoking of ~ 12 cigarettes.  She remains interested in attempting to quit.    She plans a beach trip this weekend and believes quitting smoking at a time when she is anticipating alcohol ingestion is unlikely to be successful.   She is willing to start nicotine patches on the way home from the beach.  She will also use nicotine gum - prn.     We discussed use of cigarettes with patch and she was happy to gain insight on need to reduce cigarettes (to use of 1-2 cigarettes per day) was acceptable  but that complete abstinence was our goal eventually.  Progress of reducing cigarette intake to minimal amount with patches and gum was goal for next week.    I will plan to contact her again in 1 week.

## 2020-04-01 NOTE — Telephone Encounter (Signed)
Noted and agree. 

## 2020-04-02 ENCOUNTER — Other Ambulatory Visit: Payer: Self-pay | Admitting: Obstetrics and Gynecology

## 2020-04-02 MED ORDER — NORETHINDRONE 0.35 MG PO TABS: 1.0000 | ORAL_TABLET | Freq: Every day | ORAL | 4 refills | Status: DC

## 2020-04-07 ENCOUNTER — Telehealth: Payer: Self-pay | Admitting: Pharmacist

## 2020-04-07 NOTE — Telephone Encounter (Signed)
-----   Message from Leavy Cella, [place] sent at 04/01/2020  1:26 PM EDT ----- Regarding: tobacco cessation Atttempting to quit/cut back on 4/18 using patch + gum

## 2020-04-07 NOTE — Telephone Encounter (Signed)
Attempted contact to assess progress with planned tobacco cessation - quit date 4/18  Plan to follow-up in 2 days.

## 2020-04-09 ENCOUNTER — Telehealth: Payer: Self-pay | Admitting: Pharmacist

## 2020-04-09 NOTE — Telephone Encounter (Signed)
Attempted for second time this week to follow-up RE tobacco cessation.   Patient's mailbox FULL  Attempt 1 additional call next week.

## 2020-04-16 ENCOUNTER — Ambulatory Visit: Payer: Medicare Other | Admitting: Family Medicine

## 2020-04-21 ENCOUNTER — Telehealth: Payer: Self-pay | Admitting: Pharmacist

## 2020-04-21 NOTE — Telephone Encounter (Signed)
Attempted contact - no answer - left message requesting call back if interested in additional support with quitting.   I am willing to help if requested again OR if patient requests assistance directly.    I will not plan additional phone follow-up at this time.

## 2020-04-28 ENCOUNTER — Telehealth: Payer: Self-pay | Admitting: Family Medicine

## 2020-04-28 NOTE — Telephone Encounter (Signed)
Family medicine after-hours line  Received after-hours line page. Patient afraid because she took her Suboxone with Tylenol. She had been reading on the Internet that this combination could be potentially fatal. Confirm with patient that she was taking Tylenol 500 mg inhaler taken to these tablets. Informed patient that it is okay to continue take both these medications as if there is not a significant interaction. Patient also takes gabapentin and methocarbamol for endometriosis and it is okay to take both these as prescribed as well. If continues to have any significant side effects or symptoms or is concerned or alarmed in any way she can always schedule a follow-up appointment in clinic to further discuss.  Guadalupe Dawn MD PGY-3 Family Medicine Resident

## 2020-05-06 ENCOUNTER — Other Ambulatory Visit: Payer: Self-pay

## 2020-05-06 DIAGNOSIS — N809 Endometriosis, unspecified: Secondary | ICD-10-CM

## 2020-05-06 MED ORDER — METHOCARBAMOL 500 MG PO TABS: 500.0000 mg | ORAL_TABLET | Freq: Three times a day (TID) | ORAL | 0 refills | Status: DC | PRN

## 2020-05-06 MED FILL — NICOTINE 21 MG/24HR PATCH: 21 | 14 days supply | Qty: 14 | Fill #1

## 2020-05-07 ENCOUNTER — Telehealth: Payer: Self-pay

## 2020-05-07 NOTE — Telephone Encounter (Signed)
Patient calls nurse line requesting smoking cessation sent to outpatient pharmacy. Patient reports she smokes 30 minutes after waking up and continues smoking 1 PPD everyday for the last month. Patient reports a recent incident that has caused this. Patient scheduled with PCP for 6/11, as she missed her follow-up and is requesting 90 days worth of Robaxin. Patient states she has ~2 weeks worth of patches left, 21mg . Patient requesting another refill sent to outpatient pharmacy.

## 2020-05-22 NOTE — Telephone Encounter (Signed)
Attempted to contact patient RE tobacco cessation attempt.following recent request for nicotine patch refill.   Left message that I will try to call again within the next week.

## 2020-05-28 ENCOUNTER — Ambulatory Visit: Payer: Medicare Other | Admitting: Family Medicine

## 2020-05-28 ENCOUNTER — Telehealth: Payer: Self-pay | Admitting: Pharmacist

## 2020-05-28 NOTE — Telephone Encounter (Signed)
Attempted call to assess tobacco cessation.   Left message.  Plan to call again in 2-3 weeks.

## 2020-06-18 ENCOUNTER — Other Ambulatory Visit: Payer: Self-pay

## 2020-06-18 DIAGNOSIS — N809 Endometriosis, unspecified: Secondary | ICD-10-CM

## 2020-06-18 MED ORDER — METHOCARBAMOL 500 MG PO TABS: 500.0000 mg | ORAL_TABLET | Freq: Three times a day (TID) | ORAL | 0 refills | Status: DC | PRN

## 2020-06-18 NOTE — Telephone Encounter (Signed)
Patient calls nurse line regarding receiving refill of Robaxin. Patient reports starting menstrual cycle today and that this medication considerably helps with her cramps. Patient has follow up appointment on 06/28/20.   Forwarding to PCP  Talbot Grumbling, RN

## 2020-06-25 ENCOUNTER — Telehealth: Payer: Self-pay | Admitting: Family Medicine

## 2020-06-25 MED ORDER — NICOTINE 21 MG/24HR TD PT24: 21.0000 mg | MEDICATED_PATCH | Freq: Every day | TRANSDERMAL | 3 refills | Status: DC

## 2020-06-25 NOTE — Telephone Encounter (Signed)
Spoke with patient regarding nicotine patches.  21 mg patches sent to [facility] outpatient pharmacy.  Also discussed making sure she answers the phone when Dr. Everitt Amber comes regarding her smoking cessation and she is agreeable to this.

## 2020-06-25 NOTE — Telephone Encounter (Signed)
Patient would like to have her nicotine patch 21 mg refilled at [facility] pharmacy. Patient asked if this could be done today due to her not living in [place] anymore. I explained that it can take 24-48 hours. The patient was understanding.   She would like for someone to call her and let her know when the prescription has been sent to pharmacy.   The best call back number is [phone].

## 2020-06-27 ENCOUNTER — Telehealth: Payer: Self-pay | Admitting: Student in an Organized Health Care Education/Training Program

## 2020-06-27 NOTE — Telephone Encounter (Signed)
**  After Hours/ Emergency Line Call*  CC "Tooth pain is killing me."  S: On suboxone and took an extra half a pill today. Supposed to be taking 2 per day. Also took alieve with BC powder, gargled with peroxide, put a tea bag on her molar which did not help. Have not tried tylenol.  Patient also took an amoxicillin from her daughters leftover medications.  Root canal scheduled in January- delayed due to insurance issues. Needed penicillin and naproxen last time this helped.  Denies fevers, swelling or growth or redness to touch or vision in oral cavity. Just a big hole in the tooth. F/u dentist appointment on the 17th.has PCP appointment tomorrow. - patient is concerned that she is taking too many medications together and she is worried about interactions and side effects.  O: - no respiratory distress.  - alert and oriented  A/P: - decrease NSAID use and discussed potential negative side effects of high dose - trial tylenol max 3g per day - f/u with PCP visit tomorrow for inspection of possible infection. - recommended she present to the ED overnight if she experiences temperature >100.4 - counseled patient on antibiotic stewardship. Do not take others antibiotics and to complete all prescribed antibiotics to avoid contributing to bacterial resistance - patient understood and agreed with plan

## 2020-06-28 ENCOUNTER — Ambulatory Visit (INDEPENDENT_AMBULATORY_CARE_PROVIDER_SITE_OTHER): Payer: Medicare Other | Admitting: Family Medicine

## 2020-06-28 ENCOUNTER — Encounter: Payer: Self-pay | Admitting: Family Medicine

## 2020-06-28 ENCOUNTER — Other Ambulatory Visit: Payer: Self-pay

## 2020-06-28 ENCOUNTER — Telehealth: Payer: Self-pay | Admitting: Family Medicine

## 2020-06-28 VITALS — BP 118/64 | HR 69 | Ht 67.5 in | Wt 141.8 lb

## 2020-06-28 DIAGNOSIS — K0889 Other specified disorders of teeth and supporting structures: Secondary | ICD-10-CM

## 2020-06-28 DIAGNOSIS — N809 Endometriosis, unspecified: Secondary | ICD-10-CM

## 2020-06-28 MED ORDER — NORETHINDRONE 0.35 MG PO TABS: 1.0000 | ORAL_TABLET | Freq: Every day | ORAL | 3 refills | Status: DC

## 2020-06-28 MED ORDER — PENICILLIN V POTASSIUM 250 MG PO TABS: 250.0000 mg | ORAL_TABLET | Freq: Three times a day (TID) | ORAL | 0 refills | Status: AC

## 2020-06-28 MED ORDER — PENICILLIN V POTASSIUM 250 MG PO TABS: 250.0000 mg | ORAL_TABLET | Freq: Three times a day (TID) | ORAL | Status: DC

## 2020-06-28 NOTE — Progress Notes (Signed)
    SUBJECTIVE:   CHIEF COMPLAINT / HPI:   Tooth pain Patient complained of chronic tooth pain which she says is worsened over the last week.  She has a dentist appointment scheduled for 7/17.  Denies any fever, erythema around the tooth, drainage but says that the pain is severe.  Patient reports that she knew she would eventually have to have this tooth out but was hoping to wait until the beginning of next year so that she could have her insurance reset.  Uterine pain Patient's chief complaint is for continued severe pain during menses.  Patient has prior diagnosis of endometriosis with ablations.  Patient is still having issues with heavy flow and passage of clots, severe cramping pain which is not relieved by ibuprofen.  She is prescribed Robaxin which she says helps intermittently.  Patient has been prescribed OCPs in the past but has not taken them because she is worried about blood clots because she is a smoker.  Most recently patient saw gynecologist in February 2021 2 had a pelvic ultrasound ordered and prescribed progesterone only oral contraceptives.   OBJECTIVE:   BP 118/64   Pulse 69   Ht 5' 7.5" (1.715 m)   Wt 141 lb 12.8 oz (64.3 kg)   SpO2 99%   BMI 21.88 kg/m   General: Well-appearing, sitting in chair next to exam table, no acute distress HEENT: Patient has right-sided lower molar which is causing her pain.  There is no erythema or edema in the gum or around the tooth.  The tooth is stable but has severe decay.  Patient has poor dentition Respiratory: Normal work of breathing, lungs are clear to auscultation bilaterally Cardiac: Regular rate and rhythm, no murmurs appreciated Abdomen: Nontender to palpation, positive bowel sounds  ASSESSMENT/PLAN:   Endometriosis Patient with longstanding history of endometriosis which causes her severe pain.  She was seen Dr. Elly Modena in February 2021 who recommended progesterone only oral contraceptive pills.  Patient was concerned  because she was smoking and did not want to take any hormonal medications because she is concerned about DVT.  She has not taken that medication and reports she lost the prescription.  Patient continues to take Robaxin as well as NSAIDs. -Discussed progesterone only pills and patient is willing to take them -Prescription sent for [facility] -Follow-up in 2 months to assess for improvement -May need further evaluation by gynecology  Tooth pain Patient with long history of dental issues.  She reports that she knew that this tooth would need to come out but was hoping to wait until January so that her insurance had to reset.  Patient has an appointment with her dentist on the 17th of this month. -Prescription for penicillin V sent to patient's pharmacy -Patient will follow up with dentist regarding further management     Gifford Shave, MD [place]

## 2020-06-28 NOTE — Telephone Encounter (Addendum)
**  After Hours/ Emergency Line Call**  Received a call to report that [name] saw Dr. Caron Presume today for tooth pain. Called because PCN was not sent in at all. Needs antibiotics until appt 7/16. Feels as if she has taken too much anti-inflammatory Aleve and BC powder and takes suboxone. Took 2 amoxicillins of her daughters. Has aching upper abdominal discomfort. Recommended that she stop taking a lot of medication and especially in combination. Would just take tylenol for now regular strength every 4 hours or extra strength every 8 hours. PCN sent into pharmacy was ordered as clinic administer med. PCP phoned during encounter. Red flags discussed.  Will forward to PCP.   Gerlene Fee, DO PGY-2, [facility] 06/28/2020 6:33 PM

## 2020-06-28 NOTE — Patient Instructions (Addendum)
It was good seeing you today.  For your issues with cramping I want you to take the progesterone only pill Camila.  I see in your med list that you may have been on Micronor in the past.  If you are still taking that please stay with that and do not take the new medication I prescribed.  Regarding your tooth pain I want you to follow-up with your dentist but I have prescribed penicillin that you will need to take 3 times a day until that visit.  If you have any questions or concerns please feel free to call the clinic.  Have a wonderful afternoon!

## 2020-06-29 DIAGNOSIS — K0889 Other specified disorders of teeth and supporting structures: Secondary | ICD-10-CM | POA: Insufficient documentation

## 2020-06-29 NOTE — Assessment & Plan Note (Signed)
Patient with long history of dental issues.  She reports that she knew that this tooth would need to come out but was hoping to wait until January so that her insurance had to reset.  Patient has an appointment with her dentist on the 17th of this month. -Prescription for penicillin V sent to patient's pharmacy -Patient will follow up with dentist regarding further management

## 2020-06-29 NOTE — Assessment & Plan Note (Signed)
Patient with longstanding history of endometriosis which causes her severe pain.  She was seen Dr. Elly Modena in February 2021 who recommended progesterone only oral contraceptive pills.  Patient was concerned because she was smoking and did not want to take any hormonal medications because she is concerned about DVT.  She has not taken that medication and reports she lost the prescription.  Patient continues to take Robaxin as well as NSAIDs. -Discussed progesterone only pills and patient is willing to take them -Prescription sent for [facility] -Follow-up in 2 months to assess for improvement -May need further evaluation by gynecology

## 2020-06-29 NOTE — Telephone Encounter (Signed)
OPENED IN ERROR

## 2020-07-08 NOTE — Telephone Encounter (Signed)
Contacted patient RE tobacco cessation attempt.   Patient denies any full day of abstinence recently.  She has NOT been using patches daily.   She remains highly interested in attempt to quit.  She willing to consider quit date in 3 days (Sunday) and restart patches.   I will plan to call her in 1 week to assess progress.

## 2020-07-16 ENCOUNTER — Telehealth: Payer: Self-pay | Admitting: Pharmacist

## 2020-07-16 NOTE — Telephone Encounter (Signed)
Attempted contact 7/29 and 7/30 (left message).   I will try again in ~ 1 week.  If no response I will request that she contact us if help is needed.

## 2020-10-11 ENCOUNTER — Ambulatory Visit (INDEPENDENT_AMBULATORY_CARE_PROVIDER_SITE_OTHER): Payer: Medicare Other | Admitting: Student in an Organized Health Care Education/Training Program

## 2020-10-11 ENCOUNTER — Telehealth: Payer: Self-pay

## 2020-10-11 ENCOUNTER — Other Ambulatory Visit: Payer: Self-pay

## 2020-10-11 VITALS — BP 110/72 | HR 73 | Wt 139.4 lb

## 2020-10-11 DIAGNOSIS — R634 Abnormal weight loss: Secondary | ICD-10-CM | POA: Diagnosis not present

## 2020-10-11 DIAGNOSIS — R233 Spontaneous ecchymoses: Secondary | ICD-10-CM | POA: Insufficient documentation

## 2020-10-11 DIAGNOSIS — Z716 Tobacco abuse counseling: Secondary | ICD-10-CM | POA: Diagnosis not present

## 2020-10-11 DIAGNOSIS — R238 Other skin changes: Secondary | ICD-10-CM

## 2020-10-11 DIAGNOSIS — R0789 Other chest pain: Secondary | ICD-10-CM | POA: Insufficient documentation

## 2020-10-11 NOTE — Assessment & Plan Note (Signed)
Describes long-term easy bruising. No severe hemorrhages, bleeding membranes. Not taking blood thinners. Possible family history of bleeding disorder.  - CBC with diff today - discontinue NSAIDs

## 2020-10-11 NOTE — Progress Notes (Signed)
    SUBJECTIVE:   CHIEF COMPLAINT / HPI:  Check up, chest pains, purple marks on legs, smoking cessation.  Chest pain- intermittent. Started to occurr at the beginning of [place]. Seen by cardiology and received a workup including stress test and long-term ambulatory monitoring. Everything cleared from workup up but still feeling symptoms. Does not feel that is is related to anxiety. Throbbing/sharp pain in substernal area that lasts 1-2 minutes. Occurs about 3-4 times per week. Has h/o palpitations on seroquel but has not taken for a year. Denies diaphoresis, lightheadedness, radiating pain, SOB. Occurred when she was on the toilet straining today. But not always when it occurs. Happens whether she is straining or resting. No association with activity or exertion.   Discontinued smoking 2 days ago. On 21mg  patches and 4mg  gum. About a ppd previously. Did not start Wellbutrin.   Bruising- easy bruising on lower extremities only. For a few years. Not on any blood thinners. Does not have any known trauma and will notice bruises from just minor bumps. Positive family history of unkown bleeding or clotting disorder in her father who is on blood thinners and receives regular blood treatments. Denies LE swelling.   Thyroid- suggested to be tested by dr who treats her for substance abuse. Mom and all sisters have unkown thyroid issues. Has decreased appetitive, unintentional weight loss, difficulty sleeping, blurred vision. Hands have numbness and tingling in ulnar distribution. Started school in August. No family history of MS. Regular TSH check 1 year ago  Other supplements not in med list: L-lysine supplement for protein supplementation, melatonin and omega-3.  1-2/week takes ibuprofen or naproxen for headaches. Does not take ASA.   OBJECTIVE:   BP 110/72   Pulse 73   Wt 139 lb 6.4 oz (63.2 kg)   SpO2 99%   BMI 21.51 kg/m   General: NAD, pleasant, able to participate in exam Cardiac: RRR, normal  heart sounds, no murmurs. 2+ radial and PT pulses bilaterally Respiratory: CTAB, normal effort, No wheezes, rales or rhonchi Abdomen: soft, nontender, nondistended, no hepatic or splenomegaly, +BS Extremities: no edema. WWP. Skin: warm and dry, no rashes noted. Approximately 5cm hematoma on posterior left thigh- tender to palpation. Another circular ecchymosis/abrasion on left lateral calf.  Neuro: alert and oriented, no focal deficits Psych: Normal affect and mood  ASSESSMENT/PLAN:   Chest pain, non-cardiac interchest score 0.  History and physical make cardiac cause of chest sensations described above very unlikely.  Possible etiologies to consider are thyroid, anxiety  - TSH today (positive FmHx) and unintentional weight loss - follow up with PCP to further evaluate anxiety, GAD-7 recommended  Encounter for smoking cessation counseling Day two of no cigarettes.  Using 21mg  patches and gum Declined wellbutrin from other provider congratulated patient on her progress  Easy bruising Describes long-term easy bruising. No severe hemorrhages, bleeding membranes. Not taking blood thinners. Possible family history of bleeding disorder.  - CBC with diff today - discontinue NSAIDs  Weight loss, unintentional Down about 12 pounds since a year ago. She tries to gain weight.  Strong family history of thyroid disorders.  Could be contributing to chest pain episodes as well Normal TSH a year ago - TSH repeat today     Richarda Osmond, [place]

## 2020-10-11 NOTE — Assessment & Plan Note (Signed)
Down about 12 pounds since a year ago. She tries to gain weight.  Strong family history of thyroid disorders.  Could be contributing to chest pain episodes as well Normal TSH a year ago - TSH repeat today

## 2020-10-11 NOTE — Patient Instructions (Signed)
It was a pleasure to see you today!  To summarize our discussion for this visit:  Your chest pains do not sound like they are cardiac in nature. We can look at some other causes for those sensations today including checking your thyroid function  We are checking your blood for causes of bruising.   Please follow up with your PCP for continued follow up of other contributions to these episodes.   Congratulations on stopping smoking!!  Some additional health maintenance measures we should update are: Health Maintenance Due  Topic Date Due  . COVID-19 Vaccine (1) Never done  . INFLUENZA VACCINE  07/18/2020  . PAP SMEAR-Modifier  11/20/2020  .   Call the clinic at [phone] if your symptoms worsen or you have any concerns.   Thank you for allowing me to take part in your care,  Dr. Doristine Mango

## 2020-10-11 NOTE — Assessment & Plan Note (Signed)
interchest score 0.  History and physical make cardiac cause of chest sensations described above very unlikely.  Possible etiologies to consider are thyroid, anxiety  - TSH today (positive FmHx) and unintentional weight loss - follow up with PCP to further evaluate anxiety, GAD-7 recommended

## 2020-10-11 NOTE — Assessment & Plan Note (Signed)
Day two of no cigarettes.  Using 21mg  patches and gum Declined wellbutrin from other provider congratulated patient on her progress

## 2020-10-11 NOTE — Telephone Encounter (Signed)
Pt walked in and provided covid -19 vaccination id card 10/11/20.

## 2020-10-12 LAB — CBC WITH DIFFERENTIAL
Basophils Absolute: 0 10*3/uL (ref 0.0–0.2)
Basos: 1 %
EOS (ABSOLUTE): 0.2 10*3/uL (ref 0.0–0.4)
Eos: 4 %
Hematocrit: 36.7 % (ref 34.0–46.6)
Hemoglobin: 11.8 g/dL (ref 11.1–15.9)
Immature Grans (Abs): 0 10*3/uL (ref 0.0–0.1)
Immature Granulocytes: 0 %
Lymphocytes Absolute: 2.2 10*3/uL (ref 0.7–3.1)
Lymphs: 44 %
MCH: 27.5 pg (ref 26.6–33.0)
MCHC: 32.2 g/dL (ref 31.5–35.7)
MCV: 86 fL (ref 79–97)
Monocytes Absolute: 0.4 10*3/uL (ref 0.1–0.9)
Monocytes: 8 %
Neutrophils Absolute: 2.2 10*3/uL (ref 1.4–7.0)
Neutrophils: 43 %
RBC: 4.29 x10E6/uL (ref 3.77–5.28)
RDW: 12.7 % (ref 11.7–15.4)
WBC: 5 10*3/uL (ref 3.4–10.8)

## 2020-10-12 LAB — BASIC METABOLIC PANEL
BUN/Creatinine Ratio: 12 (ref 9–23)
BUN: 8 mg/dL (ref 6–20)
CO2: 23 mmol/L (ref 20–29)
Calcium: 9.7 mg/dL (ref 8.7–10.2)
Chloride: 101 mmol/L (ref 96–106)
Creatinine, Ser: 0.65 mg/dL (ref 0.57–1.00)
GFR calc Af Amer: 134 mL/min/{1.73_m2} (ref >59)
GFR calc non Af Amer: 116 mL/min/{1.73_m2} (ref >59)
Glucose: 78 mg/dL (ref 65–99)
Potassium: 4.1 mmol/L (ref 3.5–5.2)
Sodium: 138 mmol/L (ref 134–144)

## 2020-10-12 LAB — TSH: TSH: 0.995 u[IU]/mL (ref 0.450–4.500)

## 2020-10-12 NOTE — Telephone Encounter (Signed)
Covid vaccine type and dates documented in pt chart. Yecenia Dalgleish Zimmerman Rumple, CMA

## 2020-10-22 ENCOUNTER — Other Ambulatory Visit: Payer: Self-pay | Admitting: Family Medicine

## 2020-10-22 DIAGNOSIS — N809 Endometriosis, unspecified: Secondary | ICD-10-CM

## 2021-03-15 ENCOUNTER — Other Ambulatory Visit: Payer: Self-pay

## 2021-03-15 DIAGNOSIS — N809 Endometriosis, unspecified: Secondary | ICD-10-CM

## 2021-03-15 MED ORDER — METHOCARBAMOL 500 MG PO TABS: ORAL_TABLET | ORAL | 0 refills | Status: DC

## 2021-03-18 ENCOUNTER — Encounter (HOSPITAL_COMMUNITY): Payer: Self-pay

## 2021-03-18 ENCOUNTER — Emergency Department (HOSPITAL_COMMUNITY)
Admission: EM | Admit: 2021-03-18 | Discharge: 2021-03-18 | Disposition: A | Discharge status: Home / Self Care | Payer: Medicare Other | Attending: Emergency Medicine | Admitting: Emergency Medicine

## 2021-03-18 DIAGNOSIS — J45909 Unspecified asthma, uncomplicated: Secondary | ICD-10-CM | POA: Diagnosis not present

## 2021-03-18 DIAGNOSIS — M546 Pain in thoracic spine: Secondary | ICD-10-CM | POA: Insufficient documentation

## 2021-03-18 DIAGNOSIS — M25552 Pain in left hip: Secondary | ICD-10-CM | POA: Insufficient documentation

## 2021-03-18 DIAGNOSIS — Y92410 Unspecified street and highway as the place of occurrence of the external cause: Secondary | ICD-10-CM | POA: Diagnosis not present

## 2021-03-18 DIAGNOSIS — F17210 Nicotine dependence, cigarettes, uncomplicated: Secondary | ICD-10-CM | POA: Insufficient documentation

## 2021-03-18 DIAGNOSIS — M545 Low back pain, unspecified: Secondary | ICD-10-CM | POA: Insufficient documentation

## 2021-03-18 DIAGNOSIS — M542 Cervicalgia: Secondary | ICD-10-CM | POA: Insufficient documentation

## 2021-03-18 MED ORDER — METHOCARBAMOL 500 MG PO TABS: 500.0000 mg | ORAL_TABLET | Freq: Two times a day (BID) | ORAL | 0 refills | Status: DC

## 2021-03-18 MED ORDER — LIDOCAINE 5 % EX PTCH
1.0000 | MEDICATED_PATCH | CUTANEOUS | Status: DC
  Administered 2021-03-18: 1 via TRANSDERMAL
  Filled 2021-03-18: qty 1

## 2021-03-18 NOTE — ED Notes (Signed)
ED Provider at bedside. 
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
## 2021-03-18 NOTE — ED Notes (Signed)
Pt escorted to discharge window, prescriptions, and follow-up. Verbalized understanding discharge instructions. In no acute distress.
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
## 2021-03-24 ENCOUNTER — Other Ambulatory Visit: Payer: Self-pay

## 2021-03-24 ENCOUNTER — Ambulatory Visit (INDEPENDENT_AMBULATORY_CARE_PROVIDER_SITE_OTHER): Payer: Medicare Other | Admitting: Family Medicine

## 2021-03-24 VITALS — BP 125/60 | HR 70 | Ht 67.5 in | Wt 143.0 lb

## 2021-03-24 DIAGNOSIS — N809 Endometriosis, unspecified: Secondary | ICD-10-CM

## 2021-03-24 MED ORDER — GABAPENTIN 100 MG PO CAPS: 100.0000 mg | ORAL_CAPSULE | Freq: Three times a day (TID) | ORAL | 1 refills | Status: DC

## 2021-03-24 NOTE — Patient Instructions (Signed)
It was great to see you today.  Here is a quick review of the things we talked about:   Tubal ligation clip:-Referral to gynecology so he could have a longer discussion with the surgeon about the clip used for your tubal ligation.  I think for now, you should consider other forms of birth control because your tubal ligation may not be functional.  Endometriosis: I am sorry that he continues have significant pain.  I recommend you continue to use Motrin for significant pain.  We can also start using gabapentin today.  Start taking 1 pill, at night.  If you have no trouble with 1 pill at night, you can take up to 3 pills at night.  Be aware, this can contribute to grogginess.   I have placed a referral to gynecology you should get a call in the next 1-2 weeks to set up your appointment.  Please let me know if you have not received a call in the next 2 weeks.

## 2021-03-24 NOTE — Assessment & Plan Note (Signed)
She is aware that she cannot be on estrogen-based birth control therapy while she is a smoker.  We briefly discussed progestin based therapy with POPs.  She has tried this for several days in the past but stopped because she got nervous.  She would like to consider this option more before starting her progesterone only pills.  She is interested in speaking with a gynecologist about her endometriosis.  She is willing to try gabapentin to see if that will aid her pain control. -Gabapentin 100 mg nightly, okay to increase to 300 mg as tolerated -Placed referral to gynecology

## 2021-03-24 NOTE — Progress Notes (Signed)
    SUBJECTIVE:   CHIEF COMPLAINT / HPI:   History of tubal ligation Ms. Rhonda Cabrera was recently seen at urgent care at which time abdominal CT was performed and showed evidence that patient creatinine during her tubal ligation have moved.  She was made aware of these findings and told to follow-up with her primary care doctor.  She reports that she is not currently using any other form of birth control.  Endometriosis She has had symptoms from endometriosis for years.  In the past month, she had significantly worsened symptoms of endometriosis needed to be seen at urgent care.  She was previously taking Motrin and Tylenol for her abdominal pain during her monthly painful episodes.  These episodes typically last 5-7 days/month.  These interventions are no longer particularly helpful.  She is also currently on Suboxone for opioid use disorder.  She is aware of the preferred therapy for endometriosis is hormonal birth control but she is wary of hormonal birth control because she is a smoker.  She recognizes that there is a risk of blood clot with hormonal birth control and she is not willing to risk a possibility.  She is interested in smoking cessation.  PERTINENT  PMH / PSH: History of tubal ligation, current smoker, opioid use disorder  OBJECTIVE:   BP 125/60   Pulse 70   Ht 5' 7.5" (1.715 m)   Wt 143 lb (64.9 kg)   LMP 03/19/2021   SpO2 99%   BMI 22.07 kg/m    General: Alert and cooperative and appears to be in no acute distress Cardio: Normal S1 and S2, no S3 or S4. Rhythm is regular. No murmurs or rubs.   Pulm: Clear to auscultation bilaterally, no crackles, wheezing, or diminished breath sounds. Normal respiratory effort Abdomen: Bowel sounds normal. Abdomen soft.  Moderate tenderness with palpation of her lower abdominal quadrants bilaterally in the suprapubic area. Extremities: No peripheral edema. Warm/ well perfused.  Strong radial pulses. Neuro: Cranial nerves grossly  intact  ASSESSMENT/PLAN:   Endometriosis She is aware that she cannot be on estrogen-based birth control therapy while she is a smoker.  We briefly discussed progestin based therapy with POPs.  She has tried this for several days in the past but stopped because she got nervous.  She would like to consider this option more before starting her progesterone only pills.  She is interested in speaking with a gynecologist about her endometriosis.  She is willing to try gabapentin to see if that will aid her pain control. -Gabapentin 100 mg nightly, okay to increase to 300 mg as tolerated -Placed referral to gynecology   History of tubal ligation She is aware that one of her clips is out of place and this may indicate that her tubal ligation is now insufficient for birth control. -Placed referral to gynecology  Matilde Haymaker, MD [place]

## 2021-04-14 ENCOUNTER — Other Ambulatory Visit: Payer: Self-pay

## 2021-04-14 ENCOUNTER — Ambulatory Visit: Payer: Medicare Other | Admitting: Family Medicine

## 2021-04-14 ENCOUNTER — Ambulatory Visit (INDEPENDENT_AMBULATORY_CARE_PROVIDER_SITE_OTHER): Payer: Medicare Other | Admitting: Family Medicine

## 2021-04-14 ENCOUNTER — Encounter: Payer: Self-pay | Admitting: Family Medicine

## 2021-04-14 VITALS — BP 102/62 | HR 68 | Wt 143.2 lb

## 2021-04-14 DIAGNOSIS — M533 Sacrococcygeal disorders, not elsewhere classified: Secondary | ICD-10-CM | POA: Insufficient documentation

## 2021-04-14 MED ORDER — NAPROXEN 500 MG PO TABS: 500.0000 mg | ORAL_TABLET | Freq: Two times a day (BID) | ORAL | 0 refills | Status: AC

## 2021-04-14 NOTE — Assessment & Plan Note (Addendum)
Acute. History and physical exam appear most consistent with left SI joint dysfunction. No red flag symptoms.  - Naproxen 500mg  BID x 14 days. LMP within last 7 days thus no pregnancy test performed today. Advised to avoid other NSAIDs - Tylenol PRN for break through pain - referral to physical therapy - referral to sports medicine for discussion of joint injection - topical analgesics such as voltaren gel and lidocaine patch - heating pad
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
## 2021-04-14 NOTE — Patient Instructions (Addendum)
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 Start Naproxen 500mg  twice a day for 14 days. Use tylenol for breakthrough pain Use heating pad, lidocaine patches, Voltaren gel  I have referred you to sports medicine and physical therapy. Please expect a call from them.    Sacroiliac Joint Dysfunction  Sacroiliac joint dysfunction is a condition that causes inflammation on one or both sides of the sacroiliac (SI) joint. The SI joint is the joint between two bones of the pelvis called the sacrum and the ilium. The sacrum is the bone at the base of the spine. The ilium is the large bone that forms the hip. This condition causes deep aching or burning pain in the low back. In some cases, the pain may also spread into one or both buttocks, hips, or thighs. What are the causes? This condition may be caused by:  Pregnancy. During pregnancy, extra stress is put on the SI joints because the pelvis widens.  Injury, such as: ? Injuries from car crashes. ? Sports-related injuries. ? Work-related injuries.  Having one leg that is shorter than the other.  Conditions that affect the joints, such as: ? Rheumatoid arthritis. ? Gout. ? Psoriatic arthritis. ? Joint infection (septic arthritis). Sometimes, the cause of SI joint dysfunction is not known. What are the signs or symptoms? Symptoms of this condition include:  Aching or burning pain in the lower back. The pain may also spread to other areas, such as: ? Buttocks. ? Groin. ? Thighs.  Muscle spasms in or around the painful areas.  Increased pain when standing, walking, running, stair climbing, bending, or lifting. How is this diagnosed? This condition is diagnosed with a physical exam and your medical history. During the exam, the health care provider may move one or both of your legs to different positions to check for pain. Various tests may be done to confirm the diagnosis, including:  Imaging tests to look for  other causes of pain. These may include: ? MRI. ? CT scan. ? Bone scan.  Diagnostic injection. A numbing medicine is injected into the SI joint using a needle. If your pain is temporarily improved or stopped after the injection, this can indicate that SI joint dysfunction is the problem. How is this treated? Treatment depends on the cause and severity of your condition. Treatment options can be noninvasive and may include:  Ice or heat applied to the lower back area after an injury. This may help reduce pain and muscle spasms.  Medicines to relieve pain or inflammation or to relax the muscles.  Wearing a back brace (sacroiliac brace) to help support the joint while your back is healing.  Physical therapy to increase muscle strength around the joint and flexibility at the joint. This may also involve learning proper body positions and ways of moving to relieve stress on the joint.  Direct manipulation of the SI joint.  Use of a device that provides electrical stimulation to help reduce pain at the joint. Other treatments may include:  Injections of steroid medicine into the joint to reduce pain and swelling.  Radiofrequency ablation. This treatment uses heat to burn away nerves that are carrying pain messages from the joint.  Surgery to put in screws and plates that limit or prevent joint motion. This is rare. Follow these instructions at home: Medicines  Take over-the-counter and prescription medicines only as told by your health care provider.  Ask your health care provider if the medicine prescribed to you: ?  Requires you to avoid driving or using machinery. ? Can cause constipation. You may need to take these actions to prevent or treat constipation:  Drink enough fluid to keep your urine pale yellow.  Take over-the-counter or prescription medicines.  Eat foods that are high in fiber, such as beans, whole grains, and fresh fruits and vegetables.  Limit foods that are high  in fat and processed sugars, such as fried or sweet foods. If you have a brace:  Wear the brace as told by your health care provider. Remove it only as told by your health care provider.  Keep the brace clean.  If the brace is not waterproof: ? Do not let it get wet. ? Cover it with a watertight covering when you take a bath or a shower. Managing pain, stiffness, and swelling  Icing can help with pain and swelling. Heat may help with muscle tension or spasms. Ask your health care provider if you should use ice or heat.  If directed, put ice on the affected area: ? If you have a removable brace, remove it as told by your health care provider. ? Put ice in a plastic bag. ? Place a towel between your skin and the bag. ? Leave the ice on for 20 minutes, 2-3 times a day. ? Remove the ice if your skin turns bright red. This is very important. If you cannot feel pain, heat, or cold, you have a greater risk of damage to the area.  If directed, apply heat to the affected area as often as told by your health care provider. Use the heat source that your health care provider recommends, such as a moist heat pack or a heating pad. ? Place a towel between your skin and the heat source. ? Leave the heat on for 20-30 minutes. ? Remove the heat if your skin turns bright red. This is especially important if you are unable to feel pain, heat, or cold. You may have a greater risk of getting burned.      General instructions  Rest as needed. Return to your normal activities as told by your health care provider. Ask your health care provider what activities are safe for you.  Do exercises as told by your health care provider or physical therapist.  Keep all follow-up visits. This is important. Contact a health care provider if:  Your pain is not controlled with medicine.  You have a fever.  Your pain is getting worse. Get help right away if:  You have weakness, numbness, or tingling in your legs  or feet.  You lose control of your bladder or bowels. Summary  Sacroiliac (SI) joint dysfunction is a condition that causes inflammation on one or both sides of the SI joint.  This condition causes deep aching or burning pain in the low back. In some cases, the pain may also spread into one or both buttocks, hips, or thighs.  Treatment depends on the cause and severity of your condition. It may include medicines to reduce pain and swelling or to relax muscles. This information is not intended to replace advice given to you by your health care provider. Make sure you discuss any questions you have with your health care provider. Document Revised: 04/15/2020 Document Reviewed: 04/15/2020 Elsevier Patient Education  [place].

## 2021-04-21 ENCOUNTER — Ambulatory Visit: Payer: Medicare Other | Admitting: Family Medicine

## 2021-04-22 ENCOUNTER — Ambulatory Visit: Payer: Medicare Other | Admitting: Obstetrics and Gynecology

## 2021-05-06 ENCOUNTER — Other Ambulatory Visit: Payer: Self-pay

## 2021-05-06 ENCOUNTER — Other Ambulatory Visit (HOSPITAL_COMMUNITY)
Admission: RE | Admit: 2021-05-06 | Discharge: 2021-05-06 | Disposition: A | Discharge status: Home / Self Care | Payer: Medicare Other | Source: Ambulatory Visit | Attending: Obstetrics and Gynecology | Admitting: Obstetrics and Gynecology

## 2021-05-06 ENCOUNTER — Ambulatory Visit (INDEPENDENT_AMBULATORY_CARE_PROVIDER_SITE_OTHER): Payer: Medicare Other | Admitting: Obstetrics

## 2021-05-06 ENCOUNTER — Encounter: Payer: Self-pay | Admitting: Obstetrics

## 2021-05-06 VITALS — BP 110/71 | HR 61 | Ht 67.0 in | Wt 144.0 lb

## 2021-05-06 DIAGNOSIS — N63 Unspecified lump in unspecified breast: Secondary | ICD-10-CM

## 2021-05-06 DIAGNOSIS — N809 Endometriosis, unspecified: Secondary | ICD-10-CM

## 2021-05-06 DIAGNOSIS — Z124 Encounter for screening for malignant neoplasm of cervix: Secondary | ICD-10-CM

## 2021-05-06 DIAGNOSIS — G8929 Other chronic pain: Secondary | ICD-10-CM

## 2021-05-06 DIAGNOSIS — Z113 Encounter for screening for infections with a predominantly sexual mode of transmission: Secondary | ICD-10-CM

## 2021-05-06 DIAGNOSIS — R102 Pelvic and perineal pain: Secondary | ICD-10-CM

## 2021-05-06 DIAGNOSIS — Z01419 Encounter for gynecological examination (general) (routine) without abnormal findings: Secondary | ICD-10-CM

## 2021-05-06 DIAGNOSIS — N898 Other specified noninflammatory disorders of vagina: Secondary | ICD-10-CM

## 2021-05-06 NOTE — Progress Notes (Signed)
Subjective:        Rhonda Cabrera is a 35 y.o. female here for a routine exam.  Current complaints: Endometriosis and chronic pelvic pain.  Also c/o left breast lump.  She also states that one of the clips from her tubal sterilization has popped off and was seen on a CT of the pelvis.  Personal health questionnaire:  Is patient Ashkenazi Jewish, have a family history of breast and/or ovarian cancer: no Is there a family history of uterine cancer diagnosed at age < 9, gastrointestinal cancer, urinary tract cancer, family member who is a Field seismologist syndrome-associated carrier: no Is the patient overweight and hypertensive, family history of diabetes, personal history of gestational diabetes, preeclampsia or PCOS: no Is patient over 38, have PCOS,  family history of premature CHD under age 55, diabetes, smoke, have hypertension or peripheral artery disease:  no At any time, has a partner hit, kicked or otherwise hurt or frightened you?: no Over the past 2 weeks, have you felt down, depressed or hopeless?: no Over the past 2 weeks, have you felt little interest or pleasure in doing things?:no   Gynecologic History Patient's last menstrual period was 04/09/2021 (exact date). Contraception: tubal ligation Last Pap: 11-21-2019. Results were: normal Last mammogram: 09-02-2019. Results were: normal  Obstetric History OB History  Gravida Para Term Preterm AB Living  7 6 3 3 1 6   SAB IAB Ectopic Multiple Live Births    1   1 1     # Outcome Date GA Lbr Len/2nd Weight Sex Delivery Anes PTL Lv  7 Preterm 09/07/11 [redacted]w[redacted]d 07:25 / 00:29 6 lb 8.2 oz (2.955 kg) M Vag-Spont EPI  LIV  6 Preterm 06/10/09 [redacted]w[redacted]d  5 lb 12 oz (2.608 kg) M Vag-Spont     5 Preterm 06/10/09 [redacted]w[redacted]d  5 lb 13 oz (2.637 kg) M Vag-Spont     4 Term 08/10/06   6 lb 2 oz (2.778 kg) M Vag-Spont     3 IAB 05/16/05 [redacted]w[redacted]d         2 Term 09/10/04   7 lb 6 oz (3.345 kg) F Vag-Spont     1 Term 11/18/02   7 lb 2 oz (3.232 kg) M  Vag-Spont       Past Medical History:  Diagnosis Date  . Abnormal Pap smear   . Anemia   . Anxiety   . Asthma   . Bipolar 1 disorder ([place])   . Chlamydia 07/12/2012  . Depression   . Family history of adverse reaction to anesthesia   . Headache    miraines  . Hypertension    with pregnancy only  . Opiate addiction ([place])   . Pneumonia 2017   used inhaler   . Polysubstance abuse ([place])   . PONV (postoperative nausea and vomiting)    SEVERE ITCHING AFTER EPIDURAL    Past Surgical History:  Procedure Laterality Date  . CERVICAL CONIZATION W/BX N/A 02/19/2018   Procedure: CONIZATION CERVIX WITH BIOPSY - COLD KNIFE;  Surgeon: [name], MD;  Location: [facility];  Service: Gynecology;  Laterality: N/A;  . CHOLECYSTECTOMY  11/04/2012   Procedure: LAPAROSCOPIC CHOLECYSTECTOMY WITH INTRAOPERATIVE CHOLANGIOGRAM;  Surgeon: Imogene Burn. Georgette Dover, MD;  Location: [facility];  Service: General;  Laterality: N/A;  . LAPAROSCOPY N/A 02/19/2018   Procedure: LAPAROSCOPY DIAGNOSTIC WITH PERITONEAL BIOPSIES;  Surgeon: [name], MD;  Location: [facility];  Service: Gynecology;  Laterality: N/A;  . TUBAL LIGATION  09/08/2011  Procedure: POST PARTUM TUBAL LIGATION;  Surgeon: Emeterio Reeve, MD;  Location: [facility];  Service: Gynecology;  Laterality: Bilateral;  . vaginal deliveries       Current Outpatient Medications:  .  Biotin 1 MG CAPS, Take by mouth., Disp: , Rfl:  .  Buprenorphine HCl-Naloxone HCl 8-2 MG FILM, Place 1 Film under the tongue 2 (two) times a day., Disp: , Rfl:  .  gabapentin (NEURONTIN) 100 MG capsule, Take 1 capsule (100 mg total) by mouth 3 (three) times daily., Disp: 30 capsule, Rfl: 1 .  hydrocortisone (ANUSOL-HC) 2.5 % rectal cream, Place 1 application rectally 2 (two) times daily., Disp: 28 g, Rfl: 0 .  methocarbamol (ROBAXIN) 500 MG tablet, Take 1 tablet (500 mg total) by mouth 2 (two) times daily., Disp: 20 tablet, Rfl: 0 .  Multiple Vitamin (MULTIVITAMIN) capsule, Take 1 capsule by mouth  daily., Disp: , Rfl:  .  polyethylene glycol powder (GLYCOLAX/MIRALAX) 17 GM/SCOOP powder, Take 17 g by mouth 2 (two) times daily as needed., Disp: 3350 g, Rfl: 1 .  nicotine (NICODERM CQ - DOSED IN MG/24 HOURS) 21 mg/24hr patch, Place 1 patch (21 mg total) onto the skin daily. (Patient not taking: Reported on 05/06/2021), Disp: 14 patch, Rfl: 3 .  nicotine polacrilex (NICORETTE) 2 MG gum, Take 2 mg by mouth as needed for smoking cessation. Using 5-6 pieces per day  (Patient not taking: Reported on 05/06/2021), Disp: , Rfl:  Allergies  Allergen Reactions  . Seroquel Xr [Quetiapine Fumarate Er] Palpitations    Social History   Tobacco Use  . Smoking status: Current Every Day Smoker    Packs/day: 0.50    Years: 12.00    Pack years: 6.00    Types: Cigarettes  . Smokeless tobacco: Never Used  . Tobacco comment: vaped for 1 month  Substance Use Topics  . Alcohol use: Yes    Alcohol/week: 0.0 standard drinks    Comment: socially     Family History  Problem Relation Age of Onset  . Drug abuse Mother   . Drug abuse Father   . Diabetes Maternal Grandmother   . Hypertension Maternal Grandmother   . Diabetes Paternal Grandmother   . Diabetes Paternal Grandfather   . Breast cancer Paternal Aunt       Review of Systems  Constitutional: negative for fatigue and weight loss Respiratory: negative for cough and wheezing Cardiovascular: negative for chest pain, fatigue and palpitations Gastrointestinal: negative for abdominal pain and change in bowel habits Musculoskeletal:negative for myalgias Neurological: negative for gait problems and tremors Behavioral/Psych: negative for abusive relationship, depression Endocrine: negative for temperature intolerance    Genitourinary: positive for vaginal discharge and chronic pelvic pain.  negative for abnormal menstrual periods, genital lesions, hot flashes, sexual problems  Integument/breast: negative for breast lump, breast tenderness, nipple  discharge and skin lesion(s)    Objective:       BP 110/71   Pulse 61   Ht 5\' 7"  (1.702 m)   Wt 144 lb (65.3 kg)   LMP 04/09/2021 (Exact Date)   BMI 22.55 kg/m  General:   alert and no distress  Skin:   no rash or abnormalities  Lungs:   clear to auscultation bilaterally  Heart:   regular rate and rhythm, S1, S2 normal, no murmur, click, rub or gallop  Breasts:   normal without suspicious masses, skin or nipple changes or axillary nodes  Abdomen:  normal findings: no organomegaly, soft, non-tender and no hernia  Pelvis:  External genitalia: normal general appearance Urinary system: urethral meatus normal and bladder without fullness, nontender Vaginal: normal without tenderness, induration or masses Cervix: normal appearance Adnexa: normal bimanual exam Uterus: anteverted and non-tender, normal size   Lab Review Urine pregnancy test Labs reviewed yes Radiologic studies reviewed yes  I have spent a total of 20 minutes of face-to-face time, excluding clinical staff time, reviewing notes and preparing to see patient, ordering tests and/or medications, and counseling the patient.  Assessment:      1. Encounter for routine gynecological examination with Papanicolaou smear of cervix  2. Vaginal discharge Rx: - Cervicovaginal ancillary only( [place])  3. Screening for cervical cancer Rx: - Cytology - PAP( [place])  4. Screening for STD (sexually transmitted disease) Rx: - Hepatitis B surface antigen - Hepatitis C antibody - HIV Antibody (routine testing w rflx) - RPR  5. Endometriosis determined by laparoscopy with peritoneal biopsies  6. Chronic pelvic pain in female - needs referral to chronic pelvic pain clinic  7. Breast lump Rx: - MM DIAG BREAST TOMO BILATERAL; Future - US BREAST LTD UNI LEFT INC AXILLA; Future   Plan:    Education reviewed: calcium supplements, depression evaluation, low fat, low cholesterol diet, safe sex/STD prevention, self  breast exams, smoking cessation and weight bearing exercise. Contraception: tubal ligation. Mammogram ordered. Follow up in: 3 weeks.    Orders Placed This Encounter  Procedures  . Hepatitis B surface antigen  . Hepatitis C antibody  . HIV Antibody (routine testing w rflx)  . RPR    Shelly Bombard, MD 05/06/2021 12:01 PM
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
## 2021-05-07 LAB — RPR: RPR Ser Ql: NONREACTIVE

## 2021-05-07 LAB — HEPATITIS B SURFACE ANTIGEN: Hepatitis B Surface Ag: NEGATIVE

## 2021-05-07 LAB — HEPATITIS C ANTIBODY: Hep C Virus Ab: <0.1 s/co ratio (ref 0.0–0.9)

## 2021-05-07 LAB — HIV ANTIBODY (ROUTINE TESTING W REFLEX): HIV Screen 4th Generation wRfx: NONREACTIVE

## 2021-05-09 ENCOUNTER — Ambulatory Visit
Admission: RE | Admit: 2021-05-09 | Discharge: 2021-05-09 | Disposition: A | Discharge status: Home / Self Care | Payer: Medicare Other | Source: Ambulatory Visit | Attending: Obstetrics | Admitting: Obstetrics

## 2021-05-09 ENCOUNTER — Other Ambulatory Visit: Payer: Self-pay

## 2021-05-09 DIAGNOSIS — N63 Unspecified lump in unspecified breast: Secondary | ICD-10-CM

## 2021-05-09 LAB — CERVICOVAGINAL ANCILLARY ONLY
Bacterial Vaginitis (gardnerella): NEGATIVE
Candida Glabrata: NEGATIVE
Candida Vaginitis: NEGATIVE
Chlamydia: NEGATIVE
Comment: NEGATIVE
Comment: NEGATIVE
Comment: NEGATIVE
Comment: NEGATIVE
Comment: NEGATIVE
Comment: NORMAL
Neisseria Gonorrhea: NEGATIVE
Trichomonas: NEGATIVE

## 2021-05-09 LAB — CYTOLOGY - PAP
Adequacy: ABSENT
Comment: NEGATIVE
Diagnosis: NEGATIVE
High risk HPV: NEGATIVE

## 2021-05-30 ENCOUNTER — Telehealth (INDEPENDENT_AMBULATORY_CARE_PROVIDER_SITE_OTHER): Payer: Medicare Other | Admitting: Obstetrics

## 2021-05-30 ENCOUNTER — Encounter: Payer: Self-pay | Admitting: Obstetrics

## 2021-05-30 DIAGNOSIS — R102 Pelvic and perineal pain: Secondary | ICD-10-CM

## 2021-05-30 DIAGNOSIS — G8929 Other chronic pain: Secondary | ICD-10-CM

## 2021-05-30 DIAGNOSIS — N809 Endometriosis, unspecified: Secondary | ICD-10-CM | POA: Diagnosis not present

## 2021-05-30 DIAGNOSIS — N6320 Unspecified lump in the left breast, unspecified quadrant: Secondary | ICD-10-CM

## 2021-05-30 DIAGNOSIS — N63 Unspecified lump in unspecified breast: Secondary | ICD-10-CM

## 2021-05-30 NOTE — Progress Notes (Signed)
GYNECOLOGY VIRTUAL VISIT ENCOUNTER NOTE  Provider location: [facility]   Patient location: Home  I connected with [name] on 05/30/21 at 10:30 AM EDT by MyChart Video Encounter and verified that I am speaking with the correct person using two identifiers.   I discussed the limitations, risks, security and privacy concerns of performing an evaluation and management service virtually and the availability of in person appointments. I also discussed with the patient that there may be a patient responsible charge related to this service. The patient expressed understanding and agreed to proceed.   History:  Rhonda Cabrera is a 35 y.o. [phone] female being evaluated today for left breast mass.  Diagnostic mammography done, and the patient presents virtually to discuss results and management recommendations.  She denies any abnormal vaginal discharge, bleeding, pelvic pain or other concerns.       Past Medical History:  Diagnosis Date   Abnormal Pap smear    Anemia    Anxiety    Asthma    Bipolar 1 disorder ([place])    Chlamydia 07/12/2012   Depression    Family history of adverse reaction to anesthesia    Headache    miraines   Hypertension    with pregnancy only   Opiate addiction ([place])    Pneumonia 2017   used inhaler    Polysubstance abuse ([place])    PONV (postoperative nausea and vomiting)    SEVERE ITCHING AFTER EPIDURAL   Past Surgical History:  Procedure Laterality Date   CERVICAL CONIZATION W/BX N/A 02/19/2018   Procedure: CONIZATION CERVIX WITH BIOPSY - COLD KNIFE;  Surgeon: [name], MD;  Location: [facility];  Service: Gynecology;  Laterality: N/A;   CHOLECYSTECTOMY  11/04/2012   Procedure: LAPAROSCOPIC CHOLECYSTECTOMY WITH INTRAOPERATIVE CHOLANGIOGRAM;  Surgeon: Imogene Burn. Georgette Dover, MD;  Location: [facility];  Service: General;  Laterality: N/A;   LAPAROSCOPY N/A 02/19/2018   Procedure: LAPAROSCOPY DIAGNOSTIC WITH  PERITONEAL BIOPSIES;  Surgeon: [name], MD;  Location: [facility];  Service: Gynecology;  Laterality: N/A;   TUBAL LIGATION  09/08/2011   Procedure: POST PARTUM TUBAL LIGATION;  Surgeon: Emeterio Reeve, MD;  Location: [facility];  Service: Gynecology;  Laterality: Bilateral;   vaginal deliveries     The following portions of the patient's history were reviewed and updated as appropriate: allergies, current medications, past family history, past medical history, past social history, past surgical history and problem list.   Health Maintenance:  Normal pap and negative HRHPV on 05-06-2021.    Review of Systems:  Pertinent items noted in HPI and remainder of comprehensive ROS otherwise negative.  Physical Exam:   General:  Alert, oriented and cooperative. Patient appears to be in no acute distress.  Mental Status: Normal mood and affect. Normal behavior. Normal judgment and thought content.   Respiratory: Normal respiratory effort, no problems with respiration noted  Rest of physical exam deferred due to type of encounter  Labs and Imaging No results found for this or any previous visit (from the past 336 hour(s)). US BREAST LTD UNI LEFT INC AXILLA  Addendum Date: 05/11/2021   ADDENDUM REPORT: 05/10/2021 08:11 ADDENDUM: Surgical consultation has been arranged with Dr. Stark Klein at [facility] on June 22, 2021. Stacie Acres RN on 05/10/2021 Electronically Signed   By: Lillia Mountain M.D.   On: 05/10/2021 08:11   Result Date: 05/11/2021 CLINICAL DATA:  35 year old female complaining of enlarging palpable abnormality  in the left breast. EXAM: DIGITAL DIAGNOSTIC BILATERAL MAMMOGRAM WITH TOMOSYNTHESIS AND CAD; ULTRASOUND LEFT BREAST LIMITED TECHNIQUE: Bilateral digital diagnostic mammography and breast tomosynthesis was performed. The images were evaluated with computer-aided detection.; Targeted ultrasound examination of the left breast was performed COMPARISON:  Previous exam(s). ACR Breast  Density Category d: The breast tissue is extremely dense, which lowers the sensitivity of mammography. FINDINGS: No suspicious mass, malignant type microcalcifications or distortion detected in either breast. Spot tangential view of the area of clinical concern in the far lateral 2 o'clock region of the left breast shows normal dense fibroglandular tissue. On physical exam, I palpate a focal mobile area of thickening at 2 o'clock 12 cm from the nipple. Sonographic evaluation of the palpable abnormality in the left breast at 2 o'clock 12 cm from the nipple shows dense fibroglandular tissue. No suspicious mass, abnormal shadowing or distortion visualized. IMPRESSION: No evidence of malignancy in either breast. Sonographic evaluation of the area of clinical concern in the 2 o'clock region of the left breast 12 cm from the nipple shows normal dense fibroglandular tissue. RECOMMENDATION: The patient complains of a in enlarging palpable abnormality in the 2 o'clock region of the left breast. Surgical consultation is recommended. An appointment will be made at [facility] surgery. Breast MRI may be beneficial if clinically indicated. Screening mammography starting at the age of 35 is recommended. I have discussed the findings and recommendations with the patient. If applicable, a reminder letter will be sent to the patient regarding the next appointment. BI-RADS CATEGORY  1: Negative. Electronically Signed: By: Lillia Mountain M.D. On: 05/09/2021 14:20   MM DIAG BREAST TOMO BILATERAL  Addendum Date: 05/11/2021   ADDENDUM REPORT: 05/10/2021 08:11 ADDENDUM: Surgical consultation has been arranged with Dr. Stark Klein at [facility] on June 22, 2021. Stacie Acres RN on 05/10/2021 Electronically Signed   By: Lillia Mountain M.D.   On: 05/10/2021 08:11   Result Date: 05/11/2021 CLINICAL DATA:  35 year old female complaining of enlarging palpable abnormality in the left breast. EXAM: DIGITAL DIAGNOSTIC BILATERAL  MAMMOGRAM WITH TOMOSYNTHESIS AND CAD; ULTRASOUND LEFT BREAST LIMITED TECHNIQUE: Bilateral digital diagnostic mammography and breast tomosynthesis was performed. The images were evaluated with computer-aided detection.; Targeted ultrasound examination of the left breast was performed COMPARISON:  Previous exam(s). ACR Breast Density Category d: The breast tissue is extremely dense, which lowers the sensitivity of mammography. FINDINGS: No suspicious mass, malignant type microcalcifications or distortion detected in either breast. Spot tangential view of the area of clinical concern in the far lateral 2 o'clock region of the left breast shows normal dense fibroglandular tissue. On physical exam, I palpate a focal mobile area of thickening at 2 o'clock 12 cm from the nipple. Sonographic evaluation of the palpable abnormality in the left breast at 2 o'clock 12 cm from the nipple shows dense fibroglandular tissue. No suspicious mass, abnormal shadowing or distortion visualized. IMPRESSION: No evidence of malignancy in either breast. Sonographic evaluation of the area of clinical concern in the 2 o'clock region of the left breast 12 cm from the nipple shows normal dense fibroglandular tissue. RECOMMENDATION: The patient complains of a in enlarging palpable abnormality in the 2 o'clock region of the left breast. Surgical consultation is recommended. An appointment will be made at [facility] surgery. Breast MRI may be beneficial if clinically indicated. Screening mammography starting at the age of 48 is recommended. I have discussed the findings and recommendations with the patient. If applicable, a reminder letter will be  sent to the patient regarding the next appointment. BI-RADS CATEGORY  1: Negative. Electronically Signed: By: Lillia Mountain M.D. On: 05/09/2021 14:20       Assessment and Plan:     1. Breast lump - referred to General Surgery for further evaluation  2. Endometriosis determined by laparoscopy -  referred to [facility]  3. Chronic pelvic pain in female - currently fairly stable clinically        I discussed the assessment and treatment plan with the patient. The patient was provided an opportunity to ask questions and all were answered. The patient agreed with the plan and demonstrated an understanding of the instructions.   The patient was advised to call back or seek an in-person evaluation/go to the ED if the symptoms worsen or if the condition fails to improve as anticipated.  I have spent a total of 15 minutes of non-face-to-face time, excluding clinical staff time, reviewing notes and preparing to see patient, ordering tests and/or medications, and counseling the patient.    Baltazar Najjar, MD [facility]  05/30/2021 1:34 PM

## 2021-09-24 ENCOUNTER — Encounter: Payer: Self-pay | Admitting: Family Medicine

## 2021-10-25 ENCOUNTER — Ambulatory Visit: Payer: Medicare Other | Admitting: Family Medicine

## 2021-11-01 ENCOUNTER — Other Ambulatory Visit: Payer: Self-pay

## 2021-11-01 ENCOUNTER — Ambulatory Visit: Payer: Medicare Other

## 2021-11-13 DIAGNOSIS — Y999 Unspecified external cause status: Secondary | ICD-10-CM | POA: Diagnosis not present

## 2021-11-13 DIAGNOSIS — W25XXXA Contact with sharp glass, initial encounter: Secondary | ICD-10-CM | POA: Diagnosis not present

## 2021-11-13 DIAGNOSIS — S71112A Laceration without foreign body, left thigh, initial encounter: Secondary | ICD-10-CM | POA: Diagnosis not present

## 2021-11-13 DIAGNOSIS — Z23 Encounter for immunization: Secondary | ICD-10-CM | POA: Diagnosis not present

## 2021-12-05 ENCOUNTER — Other Ambulatory Visit: Payer: Self-pay

## 2021-12-05 ENCOUNTER — Ambulatory Visit (INDEPENDENT_AMBULATORY_CARE_PROVIDER_SITE_OTHER): Payer: Medicare Other | Admitting: Family Medicine

## 2021-12-05 ENCOUNTER — Encounter: Payer: Self-pay | Admitting: Family Medicine

## 2021-12-05 VITALS — BP 106/82 | HR 84 | Wt 134.0 lb

## 2021-12-05 DIAGNOSIS — S81812S Laceration without foreign body, left lower leg, sequela: Secondary | ICD-10-CM

## 2021-12-05 DIAGNOSIS — B351 Tinea unguium: Secondary | ICD-10-CM

## 2021-12-05 MED ORDER — TERBINAFINE HCL 250 MG PO TABS: 250.0000 mg | ORAL_TABLET | Freq: Every day | ORAL | 0 refills | Status: AC

## 2021-12-05 NOTE — Progress Notes (Signed)
° ° °  SUBJECTIVE:   CHIEF COMPLAINT / HPI:   Toenail concerns  Patient presents because she feels like there is something wrong with her toenails.  She reports that she has had toenail infections in the past and had to take medications for months at a time to help them resolve.  She would like treatment again.  Is in both feet mainly on the big toes.  Toenails have steadily increased in size and thickness as well as discoloration.  Left thigh laceration Patient reports that approximately 1 week to 10 days ago she fell on a piece of glass.  She was seen in the emergency department and her leg was stapled.  She would like the staples removed.  Per chart review this injury occurred on 11/27.  OBJECTIVE:   BP 106/82    Pulse 84    Wt 134 lb (60.8 kg)    LMP 11/28/2021    BMI 20.99 kg/m   General: Well-appearing 35 year old female in no acute distress Cardiac: Regular rate and rhythm, no murmurs appreciated MSK: Patient has healed laceration on left thigh with 3 staples present.  She reports that one of the staples fell out. Foot: Patient has discoloration of the the nails of both first digits.  Thickening also apparent         ASSESSMENT/PLAN:   Onychomycosis Recurrence and toenails of first digits bilaterally.  Will need p.o. medications for 3 to 6 months.  CMP collected today to evaluate LFTs.  Repeat CMP ordered as future order for 6 weeks from now.  Follow-up in 8 to 12 weeks to determine need for further treatment.  Laceration of left lower extremity Patient sustained a laceration to her left leg on 11/13/2021.  Staples were placed in the emergency department.  She needs these removed today.  Staples removed without difficulty.     Gifford Shave, MD [place]

## 2021-12-05 NOTE — Patient Instructions (Signed)
It was great seeing you today.  I am sorry you got another fungal infection in your toes.  I sent a prescription for terbinafine to your pharmacy.  We are going to check your liver function today and will need to repeat that in 6 weeks after you have been on the medication.  You will need to take this medication for 3 months.  If you have any questions or concerns call the clinic.  I hope you have a great afternoon!

## 2021-12-06 LAB — COMPREHENSIVE METABOLIC PANEL
ALT: 6 IU/L (ref 0–32)
AST: 12 IU/L (ref 0–40)
Albumin/Globulin Ratio: 1.7 (ref 1.2–2.2)
Albumin: 4.5 g/dL (ref 3.8–4.8)
Alkaline Phosphatase: 65 IU/L (ref 44–121)
BUN/Creatinine Ratio: 15 (ref 9–23)
BUN: 13 mg/dL (ref 6–20)
Bilirubin Total: <0.2 mg/dL (ref 0.0–1.2)
CO2: 26 mmol/L (ref 20–29)
Calcium: 9.7 mg/dL (ref 8.7–10.2)
Chloride: 105 mmol/L (ref 96–106)
Creatinine, Ser: 0.87 mg/dL (ref 0.57–1.00)
Globulin, Total: 2.6 g/dL (ref 1.5–4.5)
Glucose: 87 mg/dL (ref 70–99)
Potassium: 4.5 mmol/L (ref 3.5–5.2)
Sodium: 143 mmol/L (ref 134–144)
Total Protein: 7.1 g/dL (ref 6.0–8.5)
eGFR: 89 mL/min/{1.73_m2} (ref >59)

## 2021-12-07 DIAGNOSIS — S81812A Laceration without foreign body, left lower leg, initial encounter: Secondary | ICD-10-CM | POA: Insufficient documentation

## 2021-12-07 NOTE — Assessment & Plan Note (Signed)
Patient sustained a laceration to her left leg on 11/13/2021.  Staples were placed in the emergency department.  She needs these removed today.  Staples removed without difficulty.

## 2021-12-07 NOTE — Assessment & Plan Note (Signed)
Recurrence and toenails of first digits bilaterally.  Will need p.o. medications for 3 to 6 months.  CMP collected today to evaluate LFTs.  Repeat CMP ordered as future order for 6 weeks from now.  Follow-up in 8 to 12 weeks to determine need for further treatment.

## 2021-12-30 ENCOUNTER — Telehealth: Payer: Self-pay

## 2021-12-30 MED ORDER — NICOTINE 21 MG/24HR TD PT24: 21.0000 mg | MEDICATED_PATCH | Freq: Every day | TRANSDERMAL | 3 refills | Status: DC

## 2021-12-30 NOTE — Telephone Encounter (Signed)
Patient contacted for follow/up of tobacco cessation attempt.   Since last contact patient reports she has stopped smoking for several days.    She requests refill of previously effective Nicoderm CQ 21mg . Patient verbalizes a high level of commitment to quitting.  New prescription with refills provided.   Provided information on 1 800-QUIT NOW support program.   Total time with patient call and documentation of interaction: 14 minutes.  F/U Phone call planned: 1 week

## 2021-12-30 NOTE — Telephone Encounter (Signed)
Patient calls nurse line requesting to restart nicotine patches.   Patient states that she restarted on patches yesterday from last time she tried to quit.  Patient is requesting prescription as insurance covers with prescription.   Patient reports smoking approx. 1-1.5 PPD. Patient was also asking about smoking cessation program.   Patient also wants to make sure that current medications do not interact with nicotine patches.   Patient states that she worked with Dr. Valentina Lucks in the past for smoking cessation and is interested in doing this again.   Will forward to PCP and Dr. Valentina Lucks.   Talbot Grumbling, RN

## 2021-12-31 ENCOUNTER — Telehealth: Payer: Self-pay | Admitting: Family Medicine

## 2021-12-31 NOTE — Telephone Encounter (Signed)
After Hours Page   Patient called after-hours line today because she said she was not able to send a MyChart message to Dr. Valentina Lucks.  She just wanted me to document that the nicotine patch prescribed was not the right one, that she needs the clear patch and not the one that looks like a Band-Aid. States Dr. Valentina Lucks told her to let him know if this was the case. She does report she has enough to get her through Monday.  I told her I would document this but advised her to call back on Monday during normal business hours for that medication.  Patient agreeable with plan.

## 2022-01-02 ENCOUNTER — Other Ambulatory Visit (HOSPITAL_COMMUNITY): Payer: Self-pay

## 2022-01-02 MED ORDER — NICOTINE 21 MG/24HR TD PT24
21.0000 mg | MEDICATED_PATCH | Freq: Every morning | TRANSDERMAL | 3 refills | Status: DC
  Filled 2022-01-02: qty 14, 14d supply, fill #0

## 2022-01-02 NOTE — Telephone Encounter (Signed)
Noted and agree. 

## 2022-01-05 ENCOUNTER — Telehealth: Payer: Self-pay | Admitting: Pharmacist

## 2022-01-05 DIAGNOSIS — F17200 Nicotine dependence, unspecified, uncomplicated: Secondary | ICD-10-CM

## 2022-01-05 MED ORDER — NICOTINE POLACRILEX 2 MG MT GUM: 2.0000 mg | CHEWING_GUM | OROMUCOSAL | 1 refills | Status: DC | PRN

## 2022-01-05 NOTE — Telephone Encounter (Signed)
Noted and agree. 

## 2022-01-05 NOTE — Telephone Encounter (Signed)
Patient contacted for follow/up of tobacco cessation attempt.   Since last contact patient reports she has NOT smoking for 8 days.  She also reports there her husband has not smoked for 7 days.  She was very excited about sharing the news of her weeklong quit.  Medications currently being used;  Nicotine patch 21mg . Patient reports itching with use of nicotine patch and also vivid dreams from the 21mg  dose. Discussed management of both  Nicotine gum requested.  New Rx provided.   Rates CONFIDENCE of staying quit from tobacco as very high.  Most common triggers to use tobacco include; stress, anger   Patient has used 1 800-QUIT NOW support program for additional medication supplies for her husband.   Total time with patient call and documentation of interaction: 17 minutes. F/U Phone call planned: 1 week

## 2022-01-05 NOTE — Telephone Encounter (Signed)
-----   Message from Leavy Cella, [place] sent at 12/30/2021  4:16 PM EST ----- Regarding: Quit smoking - patches being used?

## 2022-01-05 NOTE — Assessment & Plan Note (Signed)
Patient contacted for follow/up of tobacco cessation attempt.   Since last contact patient reports she has NOT smoking for 8 days.  She also reports there her husband has not smoked for 7 days.  She was very excited about sharing the news of her weeklong quit.  Medications currently being used;  Nicotine patch 21mg . Patient reports itching with use of nicotine patch and also vivid dreams from the 21mg  dose. Discussed management of both  Nicotine gum requested.  New Rx provided.   Rates CONFIDENCE of staying quit from tobacco as very high.  Most common triggers to use tobacco include; stress, anger

## 2022-01-12 ENCOUNTER — Telehealth: Payer: Self-pay | Admitting: Pharmacist

## 2022-01-12 DIAGNOSIS — F17200 Nicotine dependence, unspecified, uncomplicated: Secondary | ICD-10-CM

## 2022-01-12 NOTE — Assessment & Plan Note (Signed)
Patient contacted for follow/up of tobacco cessation attempt.  Since last contact patient reports she and her husband have quit for two weeks.  Medications currently being used;  Nicotine patch - 21mg  Nicotine Lozenge - Not currently using.  Patient denies any significant side effects from tobacco cessation therapy.  Only mild itching from patch noted.  We agreed to continue with 21mg  nicotine patch at this time.

## 2022-01-12 NOTE — Telephone Encounter (Signed)
Patient contacted for follow/up of tobacco cessation attempt.  Since last contact patient reports she and her husband have quit for two weeks.  Medications currently being used;  Nicotine patch - 21mg  Nicotine Lozenge - Not currently using.  Patient denies any significant side effects from tobacco cessation therapy.  Only mild itching from patch noted.  We agreed to continue with 21mg  nicotine patch at this time.   Total time with patient call and documentation of interaction: 14 minutes.  F/U Phone call planned: 1 week

## 2022-01-12 NOTE — Telephone Encounter (Signed)
Noted and agree. 

## 2022-01-12 NOTE — Telephone Encounter (Signed)
-----   Message from Leavy Cella, [place] sent at 01/05/2022 10:21 AM EST ----- Regarding: Tobacco Cessation - continued cessation - on 21mg  patch and 2mg  gum

## 2022-01-16 ENCOUNTER — Other Ambulatory Visit: Payer: Medicare Other

## 2022-01-20 ENCOUNTER — Telehealth: Payer: Self-pay | Admitting: Pharmacist

## 2022-01-20 DIAGNOSIS — F17200 Nicotine dependence, unspecified, uncomplicated: Secondary | ICD-10-CM

## 2022-01-20 MED ORDER — NICOTINE 14 MG/24HR TD PT24: 14.0000 mg | MEDICATED_PATCH | Freq: Every day | TRANSDERMAL | 1 refills | Status: DC

## 2022-01-20 MED ORDER — NICOTINE POLACRILEX 2 MG MT GUM: 2.0000 mg | CHEWING_GUM | OROMUCOSAL | 1 refills | Status: DC | PRN

## 2022-01-20 NOTE — Telephone Encounter (Signed)
-----   Message from Leavy Cella, [place] sent at 01/12/2022  2:33 PM EST ----- Regarding: 21mg  patch - 3 week quit call - ? taper patch?

## 2022-01-20 NOTE — Assessment & Plan Note (Signed)
Patient contacted for follow/up of tobacco cessation.   Since last contact patient reports continued abstinence X 23 days.  She also reports her husband and been completely abstinent as well.  She reports use of the tobacco QUIT LINE counseling and has obtained gum for her husband through them.  She reports success with avoiding smoking while drinking alcohol - she did admit this lead to some cravings.  We discussed how trigger induced craving will decrease with time.   Medications currently being used;  Nicotine patch - 21mg  Nicotine Gum - 2mg   Patient denies any significant side effects from tobacco cessation therapy.   Rates IMPORTANCE of quitting tobacco remains high.  Rates CONFIDENCE of quitting tobacco remains high.  Most common triggers to use tobacco include; stress but is handling well.   Motivation to quit: health.  Agreed to reduce dose of nicotine patch from 21mg  to 14mg   Refilled both patch and gum.  Encouraged continued use of the 1 800-QUIT NOW support program.

## 2022-01-20 NOTE — Telephone Encounter (Signed)
Patient contacted for follow/up of tobacco cessation.   Since last contact patient reports continued abstinence X 23 days.  She also reports her husband and been completely abstinent as well.  She reports use of the tobacco QUIT LINE counseling and has obtained gum for her husband through them.  She reports success with avoiding smoking while drinking alcohol - she did admit this lead to some cravings.  We discussed how trigger induced craving will decrease with time.   Medications currently being used;  Nicotine patch - 21mg  Nicotine Gum - 2mg   Patient denies any significant side effects from tobacco cessation therapy.   Rates IMPORTANCE of quitting tobacco remains high.  Rates CONFIDENCE of quitting tobacco remains high.  Most common triggers to use tobacco include; stress but is handling well.   Motivation to quit: health.  Agreed to reduce dose of nicotine patch from 21mg  to 14mg   Refilled both patch and gum.  Encouraged continued use of the 1 800-QUIT NOW support program.   Total time with patient call and documentation of interaction: 21 minutes.  F/U Phone call planned: 1 week.

## 2022-01-20 NOTE — Telephone Encounter (Signed)
Noted and agree. 

## 2022-01-26 ENCOUNTER — Telehealth: Payer: Self-pay | Admitting: Pharmacist

## 2022-01-26 DIAGNOSIS — F17200 Nicotine dependence, unspecified, uncomplicated: Secondary | ICD-10-CM

## 2022-01-26 NOTE — Telephone Encounter (Signed)
Noted and agree. 

## 2022-01-26 NOTE — Telephone Encounter (Signed)
-----   Message from Leavy Cella, [place] sent at 01/20/2022 10:23 AM EST ----- Regarding: tobacco cessation - QUIT and patch now at 14mg ?

## 2022-01-26 NOTE — Telephone Encounter (Signed)
Patient contacted for follow/up of tobacco cessation attempt.   Since last contact patient reports continued abstinence and shared excitement about quitting for 30 days tomorrow.  Patient shared that her husband has also quit for the same period of time.   Medications currently being used;  Nicotine patch - 21mg  daily - plan to reduce to 14mg    Patient denies any significant side effects from tobacco cessation therapy.   Most common triggers to use tobacco include; Minimal - doing well with stressors.   Motivation to stay quit: breathing (history of asthma), skin, teeth, and weight gain  Her husband utilized quit line for NRT supplies. 1 800-QUIT NOW support program.   Total time with patient call and documentation of interaction: 14 minutes.  F/U Phone call planned: 2 weeks

## 2022-01-26 NOTE — Assessment & Plan Note (Signed)
Patient contacted for follow/up of tobacco cessation attempt.   Since last contact patient reports continued abstinence and shared excitement about quitting for 30 days tomorrow.  Patient shared that her husband has also quit for the same period of time.   Medications currently being used;  Nicotine patch - 21mg  daily - plan to reduce to 14mg    Patient denies any significant side effects from tobacco cessation therapy.   Most common triggers to use tobacco include; Minimal - doing well with stressors.   Motivation to stay quit: breathing (history of asthma), skin, teeth, and weight gain  Her husband utilized quit line for NRT supplies. 1 800-QUIT NOW support program.   Total time with patient call and documentation of interaction: 14 minutes.  F/U Phone call planned: 2 weeks

## 2022-01-27 ENCOUNTER — Other Ambulatory Visit: Payer: Medicare Other

## 2022-01-27 ENCOUNTER — Telehealth: Payer: Self-pay | Admitting: Family Medicine

## 2022-01-27 ENCOUNTER — Other Ambulatory Visit: Payer: Self-pay

## 2022-01-27 DIAGNOSIS — R002 Palpitations: Secondary | ICD-10-CM

## 2022-01-27 DIAGNOSIS — B351 Tinea unguium: Secondary | ICD-10-CM | POA: Diagnosis not present

## 2022-01-27 NOTE — Telephone Encounter (Signed)
Patient came in today to get blood work to check liver function because of meds she is on. She wants to know if she needs to come back again in a month to get more blood work done, because she is on the medicine for 3 months. If someone can give her a call, she would appreciate it.

## 2022-01-28 LAB — COMPREHENSIVE METABOLIC PANEL
ALT: 8 IU/L (ref 0–32)
AST: 13 IU/L (ref 0–40)
Albumin/Globulin Ratio: 1.9 (ref 1.2–2.2)
Albumin: 4.8 g/dL (ref 3.8–4.8)
Alkaline Phosphatase: 67 IU/L (ref 44–121)
BUN/Creatinine Ratio: 15 (ref 9–23)
BUN: 13 mg/dL (ref 6–20)
Bilirubin Total: 0.3 mg/dL (ref 0.0–1.2)
CO2: 25 mmol/L (ref 20–29)
Calcium: 10.1 mg/dL (ref 8.7–10.2)
Chloride: 100 mmol/L (ref 96–106)
Creatinine, Ser: 0.86 mg/dL (ref 0.57–1.00)
Globulin, Total: 2.5 g/dL (ref 1.5–4.5)
Glucose: 77 mg/dL (ref 70–99)
Potassium: 4.6 mmol/L (ref 3.5–5.2)
Sodium: 137 mmol/L (ref 134–144)
Total Protein: 7.3 g/dL (ref 6.0–8.5)
eGFR: 90 mL/min/{1.73_m2} (ref >59)

## 2022-01-30 MED ORDER — FLUCONAZOLE 150 MG PO TABS: 150.0000 mg | ORAL_TABLET | Freq: Once | ORAL | 0 refills | Status: AC

## 2022-01-30 NOTE — Telephone Encounter (Signed)
Called patient regarding lab results.  CMP came back within normal limits.  She is going to continue taking the terbinafine.  She also reports that she just started clindamycin for an oral infection prescribed by her dentist.  She reports that when she normally starts antibiotics she gets yeast infections and is requesting medication for the yeast infection.  Prescription sent for Diflucan.

## 2022-02-10 ENCOUNTER — Telehealth: Payer: Self-pay | Admitting: Pharmacist

## 2022-02-10 MED ORDER — NICOTINE 7 MG/24HR TD PT24: 7.0000 mg | MEDICATED_PATCH | Freq: Every day | TRANSDERMAL | 1 refills | Status: DC

## 2022-02-10 NOTE — Telephone Encounter (Signed)
-----   Message from Leavy Cella, [place] sent at 01/26/2022  4:33 PM EST ----- Regarding: Quit for 6 weeks - on 14mg  patches?

## 2022-02-10 NOTE — Telephone Encounter (Signed)
Noted and agree. 

## 2022-02-10 NOTE — Telephone Encounter (Signed)
Patient contacted for follow/up of tobacco cessation attempt.   Since last contact patient reports continued abstinence from smoking.  She believes it has been 44 days since she quit smoking.  Her husband also appears to be successful with no smoking reported in > 1 month.   Medications currently being used;  Nicotine patch - 14mg   Patient denies any significant side effects from tobacco cessation therapy.   Rates IMPORTANCE of quitting tobacco as high. Rates CONFIDENCE of quitting tobacco remains high.   Most common triggers to use tobacco include; include argument, stress.    Motivation to quit: health, improved breathing,   Patient reports husband is using patch supply from 1 800-QUIT NOW support program.   She had just picked-up nicotine 14mg  patch supply #28 from her pharmacy when I called.     I plan to call her in 2 weeks to determine timing/ plan for taper to  7mg  patches.  I sent 7mg  patch supply with refills to her pharmacy to minimize risk of treatment gap.   Total time with patient call and documentation of interaction: 16 minutes.  F/U Phone call planned: 2 weeks.

## 2022-02-15 ENCOUNTER — Telehealth: Payer: Self-pay | Admitting: Family Medicine

## 2022-02-15 ENCOUNTER — Emergency Department (HOSPITAL_BASED_OUTPATIENT_CLINIC_OR_DEPARTMENT_OTHER)
Admission: EM | Admit: 2022-02-15 | Discharge: 2022-02-15 | Disposition: A | Discharge status: Home / Self Care | Payer: Medicare Other | Attending: Emergency Medicine | Admitting: Emergency Medicine

## 2022-02-15 ENCOUNTER — Other Ambulatory Visit: Payer: Self-pay

## 2022-02-15 ENCOUNTER — Encounter (HOSPITAL_BASED_OUTPATIENT_CLINIC_OR_DEPARTMENT_OTHER): Payer: Self-pay | Admitting: Urology

## 2022-02-15 DIAGNOSIS — K0889 Other specified disorders of teeth and supporting structures: Secondary | ICD-10-CM

## 2022-02-15 MED ORDER — OXYCODONE-ACETAMINOPHEN 5-325 MG PO TABS
1.0000 | ORAL_TABLET | Freq: Once | ORAL | Status: AC
  Administered 2022-02-15: 1 via ORAL
  Filled 2022-02-15: qty 1

## 2022-02-15 MED ORDER — AMOXICILLIN-POT CLAVULANATE 875-125 MG PO TABS: 1.0000 | ORAL_TABLET | Freq: Two times a day (BID) | ORAL | 0 refills | Status: DC

## 2022-02-15 MED ORDER — AMOXICILLIN-POT CLAVULANATE 875-125 MG PO TABS
1.0000 | ORAL_TABLET | Freq: Once | ORAL | Status: AC
  Administered 2022-02-15: 1 via ORAL
  Filled 2022-02-15: qty 1

## 2022-02-15 NOTE — ED Triage Notes (Signed)
Right lower dental abscess since January  ?Endodontist saw abscess in jaw on 2/25 ?Started on antibiotic and it is not working  ?Endodontist sent to ER  ?

## 2022-02-15 NOTE — Telephone Encounter (Signed)
**  After Hours/ Emergency Line Call** ? ?Received a call to report that Rhonda Cabrera is having significant dental pain. She has been dealing with dental pain issues since July and has been seeing her dentist. On February 11th, she was started on antibiotics to treat a tooth infection and had a root canal done in February as well. On the 25th, she went back for another visit due to continued pain and they "cleaned out the area" and a medication was put in the root (she is unsure what medication). She is still having significant pain and will wake up due to the pain, she has "hot flashes" and diaphoretic episodes with the pain as well. She is also concerned because the procedure was done by a doctor that only comes to this office once a month on a Saturday and won't be back until the 25th.  ?Discussed with the patient that the best option at this point would be to call the dentist when they open and try to be seen by them as they recently did her oral surgery. If they are unable to see her, or dismiss her symptoms and do nothing and she is concerned, she can consider either going to the ER to be evaluated with imaging if appropriate, or try to contact an emergency dental group for evaluation.  ? ?Patient also instructed that she can alternate Tylenol and Ibuprofen, or take them both together at the same time for severe pain to see if that improves her symptoms.  ? ?Discussed red flags discussed and patient agreeable to the plan. Will forward to PCP. ? ?Rise Patience, DO  ?PGY-2, [facility] ?02/15/2022 4:11 AM  ? ?

## 2022-02-15 NOTE — ED Provider Notes (Signed)
?[facility] EM[facility] DEPARTMENT ?Provider Note ? ? ?CSN: [id number] ?Arrival date & time: 02/15/22  1804 ? ?  ? ?History ? ?Chief Complaint  ?Patient presents with  ? Dental Pain  ? ? ?Rhonda Cabrera is a 36 y.o. female. ? ?Patient is under the care of a dentist, recent root canal on 2/11 with temporary crowns. She was seen again on 2/25, xrays obtained with suspicion for infection, and started on clindamycin. Patient has continued to have uncontrolled pain. Patient contacted her PCP today, who suggested ED evaluation as patient was not able to be seen by her dentist. Patient complaining of pain to right lower jaw. No trismus or difficulty swallowing. No fevers. ? ? ?Dental Pain ?Location:  Lower ?Lower teeth location:  28/RL 1st bicuspid and 29/RL 2nd bicuspid ?Quality:  Aching and throbbing ?Severity:  Severe ?Timing:  Constant ?Progression:  Unchanged ?Chronicity:  Recurrent ?Previous work-up:  Dental exam and root canal ?Ineffective treatments:  Acetaminophen and NSAIDs ? ?  ? ?Home Medications ?Prior to Admission medications   ?Medication Sig Start Date End Date Taking? Authorizing Provider  ?Biotin 1 MG CAPS Take by mouth.    [provider]  ?Buprenorphine HCl-Naloxone HCl 8-2 MG FILM Place 1 Film under the tongue daily.    [provider]  ?gabapentin (NEURONTIN) 100 MG capsule Take 1 capsule (100 mg total) by mouth 3 (three) times daily. 03/24/21   Matilde Haymaker, MD  ?hydrocortisone (ANUSOL-HC) 2.5 % rectal cream Place 1 application rectally 2 (two) times daily. 10/25/19   Daisy Floro, DO  ?methocarbamol (ROBAXIN) 500 MG tablet Take 1 tablet (500 mg total) by mouth 2 (two) times daily. 03/18/21   Sponseller, Gypsy Balsam, PA-C  ?Multiple Vitamin (MULTIVITAMIN) capsule Take 1 capsule by mouth daily.    [provider]  ?nicotine (NICODERM CQ - DOSED IN MG/24 HOURS) 14 mg/24hr patch Place 1 patch (14 mg total) onto the skin daily. 01/20/22   Leavy Cella, RPH-CPP   ?nicotine (NICODERM CQ - DOSED IN MG/24 HR) 7 mg/24hr patch Place 1 patch (7 mg total) onto the skin daily. 02/10/22   Leavy Cella, RPH-CPP  ?nicotine polacrilex (NICORETTE) 2 MG gum Take 1 each (2 mg total) by mouth as needed for smoking cessation. Use up to 5 pieces per day 01/20/22   Leavy Cella, RPH-CPP  ?polyethylene glycol powder (GLYCOLAX/MIRALAX) 17 GM/SCOOP powder Take 17 g by mouth 2 (two) times daily as needed. 07/01/19   Lind Covert, MD  ?terbinafine (LAMISIL) 250 MG tablet Take 1 tablet (250 mg total) by mouth daily. 12/05/21 03/05/22  Gifford Shave, MD  ?   ? ?Allergies    ?Seroquel xr [quetiapine fumarate er]   ? ?Review of Systems   ?Review of Systems  ?HENT:  Positive for dental problem.   ?All other systems reviewed and are negative. ? ?Physical Exam ?Updated Vital Signs ?BP 136/85 (BP Location: Left Arm)   Pulse (!) 54   Temp 98.6 ?F (37 ?C) (Oral)   Resp 18   Ht 5\' 7"  (1.702 m)   Wt 60.8 kg   SpO2 98%   BMI 20.99 kg/m?  ?Physical Exam ?Constitutional:   ?   Appearance: Normal appearance.  ?HENT:  ?   Head: Normocephalic.  ?   Nose: Nose normal.  ?   Mouth/Throat:  ?   Mouth: Mucous membranes are moist.  ?   Dentition: Dental tenderness present.  ?Eyes:  ?   Pupils:  Pupils are equal, round, and reactive to light.  ?Cardiovascular:  ?   Rate and Rhythm: Normal rate.  ?Pulmonary:  ?   Effort: Pulmonary effort is normal.  ?Musculoskeletal:     ?   General: Normal range of motion.  ?Lymphadenopathy:  ?   Cervical: No cervical adenopathy.  ?Skin: ?   General: Skin is warm and dry.  ?Neurological:  ?   Mental Status: She is alert and oriented to person, place, and time.  ?Psychiatric:     ?   Mood and Affect: Mood normal.     ?   Behavior: Behavior normal.  ? ? ?ED Results / Procedures / Treatments   ?Labs ?(all labs ordered are listed, but only abnormal results are displayed) ?Labs Reviewed - No data to display ? ?EKG ?None ? ?Radiology ?No results found. ? ?Procedures ?Procedures   ? ? ?Medications Ordered in ED ?Medications  ?oxyCODONE-acetaminophen (PERCOCET/ROXICET) 5-325 MG per tablet 1 tablet (1 tablet Oral Given 02/15/22 2051)  ?amoxicillin-clavulanate (AUGMENTIN) 875-125 MG per tablet 1 tablet (1 tablet Oral Given 02/15/22 2051)  ? ? ?ED Course/ Medical Decision Making/ A&P ?  ?                        ?Medical Decision Making ?Risk ?Prescription drug management. ? ? ?Patient with dentalgia.  No abscess requiring immediate incision and drainage.  Exam not concerning for Ludwig's angina or pharyngeal abscess.  Will treat with augmentin. Pt instructed to follow-up with her dentist.  Discussed return precautions. Pt safe for discharge. ? ? ? ? ? ? ? ?Final Clinical Impression(s) / ED Diagnoses ?Final diagnoses:  ?Tooth pain  ? ? ?Rx / DC Orders ?ED Discharge Orders   ? ?      Ordered  ?  amoxicillin-clavulanate (AUGMENTIN) 875-125 MG tablet  Every 12 hours       ? 02/15/22 2038  ? ?  ?  ? ?  ? ? ?  ?Etta Quill, NP ?02/15/22 2344 ? ?  ?Jeanell Sparrow, DO ?02/16/22 0202 ? ?

## 2022-02-15 NOTE — Telephone Encounter (Signed)
Patient causes the clinic concerns.  She reports that she has been with the pain management clinic receiving Suboxone for substance abuse for many years and that her clinic is now closed down.  Her last prescription was picked up on 2/21 and she has a 2-week prescription.  She brings up the fact that she needs to find a new provider to prescribe this.  She also tells me that she is concerned because when she has gone other places to establish care for substance abuse she has been told that she has to be using substances at the time to be approved.  I discussed that this is not the case and that she has had a long history of substance abuse and has been on treatment for some time and to do her best to stay clean.  She has been scheduled an appointment with me on 3/6.   ?

## 2022-02-15 NOTE — Discharge Instructions (Addendum)
Please take antibiotic as directed. Stop the clindamycin. Follow-up with your dentist as discussed. Continue with alternating ibuprofen and tylenol. ?

## 2022-02-20 ENCOUNTER — Encounter: Payer: Self-pay | Admitting: Family Medicine

## 2022-02-20 ENCOUNTER — Ambulatory Visit: Payer: Medicare Other | Admitting: Family Medicine

## 2022-02-20 NOTE — Telephone Encounter (Signed)
Called patient this morning and discussed her pain management.  She reports that she is having emergent oral surgery to remove a tooth has been causing her a lot of pain.  This is happening today and she is concerned about making it to the appointment this afternoon.  She reports that she has enough Suboxone to last through this week into next week.  An appointment has been made for her to see me on 3/13 at 2:30 PM.  Appointment for today has been canceled. ?

## 2022-02-23 ENCOUNTER — Telehealth: Payer: Self-pay | Admitting: Pharmacist

## 2022-02-23 NOTE — Telephone Encounter (Signed)
-----   Message from Leavy Cella, [place] sent at 02/10/2022 11:38 AM EST ----- ?Regarding: Quit 44 days on 2/24 - taper from 14 to '7mg'$ ? New '7mg'$  rx sent  2/24 ? ? ?

## 2022-02-23 NOTE — Telephone Encounter (Signed)
Patient contacted for follow/up of tobacco cessation attempt.  ?Since last contact patient reports continued abstinence of almost 2 months.  ? ?Medications currently being used;  ?Nicotine patch - '14mg'$   ? ?Patient denies any significant side effects from tobacco cessation therapy.  ? ?Rates CONFIDENCE of staying quit from tobacco is very high.  ? ?Provided information on 1 800-QUIT NOW support program.  ? ?Agreed with plan to reduce patch strength from 14 to '7mg'$ .   ? ?Total time with patient call and documentation of interaction: 14 minutes. ? ?F/U Phone call planned: 2 weeks ? ? ?

## 2022-02-27 ENCOUNTER — Encounter: Payer: Self-pay | Admitting: Family Medicine

## 2022-02-27 ENCOUNTER — Telehealth: Payer: Self-pay

## 2022-02-27 ENCOUNTER — Other Ambulatory Visit: Payer: Self-pay

## 2022-02-27 ENCOUNTER — Ambulatory Visit (INDEPENDENT_AMBULATORY_CARE_PROVIDER_SITE_OTHER): Payer: Medicare Other | Admitting: Family Medicine

## 2022-02-27 VITALS — BP 127/79 | HR 75 | Wt 142.6 lb

## 2022-02-27 DIAGNOSIS — F17200 Nicotine dependence, unspecified, uncomplicated: Secondary | ICD-10-CM

## 2022-02-27 DIAGNOSIS — F1121 Opioid dependence, in remission: Secondary | ICD-10-CM | POA: Diagnosis not present

## 2022-02-27 MED ORDER — BUPRENORPHINE HCL-NALOXONE HCL 8-2 MG SL FILM: 1.0000 | ORAL_FILM | Freq: Every day | SUBLINGUAL | 0 refills | Status: DC

## 2022-02-27 MED ORDER — BUPRENORPHINE HCL-NALOXONE HCL 8-2 MG SL FILM
1.0000 | ORAL_FILM | Freq: Every day | SUBLINGUAL | 0 refills | Status: DC
Start: 2022-02-27 — End: 2022-03-01

## 2022-02-27 NOTE — Patient Instructions (Signed)
It was great seeing you today.  I am glad your tooth pain has been dealt with.  Regarding your pain management I have sent a new prescription for your Suboxone to the pharmacy requested.  We are collecting a drug screen today.  You will need to be seen monthly.  When you need a refill for the medication called the pharmacy and they can call a refill request in.  Congratulations on quitting smoking!!  If you have any questions or concerns please call the clinic.  I hope you have a great day! ?

## 2022-02-27 NOTE — Progress Notes (Signed)
? ? ?SUBJECTIVE:  ? ?CHIEF COMPLAINT / HPI:  ? ?Opiate dependence ?Patient presents today for a discussion regarding her opiate dependence management.  She was seeing a pain management specialist and was prescribed Suboxone for opiate dependence.  She reports that that clinic has been shut down and would like to receive her Suboxone from our clinic if possible.  She reports that she has not been able to get a prescription for the Suboxone since 2/21 but has been clean from medications other than Suboxone.  She does report that she was concerned about running out because she did not have a new prescription and did not have a prescriber so she bought some off of the street from another patient he went to the same Suboxone clinic that she went to.  She reports that she was on methadone for 5 years prior to switching to Suboxone and has been on Suboxone for approximately 5 years.  Her current doses 8-2 film twice daily.  Patient reports that she did use opiates after having a dental extraction approximately 1 week ago.  PDMP shows prescription filled for 5-day supply of tramadol.  She reports that at her previous pain management clinic she was seen monthly but would receive 2-week prescriptions.  She was drug tested monthly and felt like that helped her stay clean and would like to continue monthly drug test if possible. ? ? ?OBJECTIVE:  ? ?BP 127/79   Pulse 75   Wt 142 lb 9.6 oz (64.7 kg)   SpO2 100%   BMI 22.33 kg/m?   ?General: Pleasant 36 year old female ?Cardiac: Regular rate and rhythm ?Respiratory: Normal work of breathing, speaking in full sentences ?Abdomen: Soft, nontender ?MSK: No gross abnormalities ?Psych: Normal thought content, occasionally tangential in thought, alert ? ?ASSESSMENT/PLAN:  ? ?Opiate addiction ([place]) ?Patient presents today to establish with chronic pain management.  Previously seeing pain management doctor who she reports is no longer practicing and so she is in need of a new  provider.  Patient has been following with the previous provider per PDMP since March 2021.  Current dose of Suboxone 8-2 film strips twice daily.  Last prescription filled 2/21.  Patient currently does have a few strips left but reports that she bought these off of someone who was going to the clinic because she was concerned she would not be able to get a new prescription and strongly desires to stay clean.  Recommended that the patient dispose of these strips out of concern that we do not know if they are legitimate or not because they are not purchased from a pharmacy.  Discussed that there is no way to know if they are truly Suboxone or some other substance.  Patient reports she will dispose of them and pick up a new prescription.  Patient would like monthly drug testing.  Discussed this with our lab and he is sure that insurance will cover testing for the initial screening but they may have issues covering other drug test.  She is going to call her insurance company to determine how many she can get per year.  Plan on testing monthly at this time.  I have sent that 2-week prescription to her pharmacy and she will request refills every other week.  She will follow-up with [place] once a month.  No further questions or concerns at this time. ? ?TOBACCO DEPENDENCE ?Patient reports that she has not smoked a cigarette in 61 days.  She is extremely happy about this.  Currently  using 14 mg patches per pharmacy note.  Congratulated her I am recommending continued cessation.  We will continue to follow this and greatly appreciate pharmacy team's assistance in caring for this patient. ?  ? ? ?Gifford Shave, MD ?[place]  ? ?

## 2022-02-27 NOTE — Assessment & Plan Note (Signed)
Patient reports that she has not smoked a cigarette in 61 days.  She is extremely happy about this.  Currently using 14 mg patches per pharmacy note.  Congratulated her I am recommending continued cessation.  We will continue to follow this and greatly appreciate pharmacy team's assistance in caring for this patient. ?

## 2022-02-27 NOTE — Telephone Encounter (Signed)
Patient calls nurse line reporting [facility] is out of Suboxone.  ? ?Prescription has been cancelled.  ? ?Please resend to [facility].  ?

## 2022-02-27 NOTE — Assessment & Plan Note (Signed)
Patient presents today to establish with chronic pain management.  Previously seeing pain management doctor who she reports is no longer practicing and so she is in need of a new provider.  Patient has been following with the previous provider per PDMP since March 2021.  Current dose of Suboxone 8-2 film strips twice daily.  Last prescription filled 2/21.  Patient currently does have a few strips left but reports that she bought these off of someone who was going to the clinic because she was concerned she would not be able to get a new prescription and strongly desires to stay clean.  Recommended that the patient dispose of these strips out of concern that we do not know if they are legitimate or not because they are not purchased from a pharmacy.  Discussed that there is no way to know if they are truly Suboxone or some other substance.  Patient reports she will dispose of them and pick up a new prescription.  Patient would like monthly drug testing.  Discussed this with our lab and he is sure that insurance will cover testing for the initial screening but they may have issues covering other drug test.  She is going to call her insurance company to determine how many she can get per year.  Plan on testing monthly at this time.  I have sent that 2-week prescription to her pharmacy and she will request refills every other week.  She will follow-up with [place] once a month.  No further questions or concerns at this time. ?

## 2022-02-27 NOTE — Telephone Encounter (Signed)
New prescription sent to requested [facility] for patient Suboxone. ?

## 2022-02-27 NOTE — Telephone Encounter (Signed)
Noted and agree. 

## 2022-03-01 ENCOUNTER — Encounter: Payer: Self-pay | Admitting: Family Medicine

## 2022-03-01 MED ORDER — BUPRENORPHINE HCL-NALOXONE HCL 8-2 MG SL FILM: 1.0000 | ORAL_FILM | Freq: Every day | SUBLINGUAL | 0 refills | Status: DC

## 2022-03-01 NOTE — Telephone Encounter (Signed)
Patient returns call to clinic regarding issues with picking up Suboxone. Patient reports that [facility] in [facility] never received rx.  ? ?[facility]. Pharmacist verified that they never received prescription.  ? ?Will forward to PCP to resend.  ? ?Talbot Grumbling, RN ? ?

## 2022-03-01 NOTE — Addendum Note (Signed)
Addended by: Concepcion Living on: 03/01/2022 02:57 PM ? ? Modules accepted: Orders ? ?

## 2022-03-01 NOTE — Addendum Note (Signed)
Addended by: Talbot Grumbling on: 03/01/2022 02:53 PM ? ? Modules accepted: Orders ? ?

## 2022-03-02 LAB — TOXASSURE SELECT 13 (MW), URINE

## 2022-03-02 MED ORDER — BUPRENORPHINE HCL-NALOXONE HCL 8-2 MG SL FILM: 1.0000 | ORAL_FILM | Freq: Two times a day (BID) | SUBLINGUAL | 0 refills | Status: DC

## 2022-03-02 NOTE — Telephone Encounter (Signed)
Prescription to pharmacy again.  ?Dorris Singh, MD  ?Family Medicine Teaching Service  ? ?

## 2022-03-02 NOTE — Addendum Note (Signed)
Addended by: Talbot Grumbling on: 03/02/2022 12:27 PM ? ? Modules accepted: Orders ? ?

## 2022-03-02 NOTE — Addendum Note (Signed)
Addended by: Talbot Grumbling on: 03/02/2022 04:25 PM ? ? Modules accepted: Orders ? ?

## 2022-03-02 NOTE — Telephone Encounter (Signed)
Patient returns call to nurse line regarding prescription. Patient reports that she has contacted the pharmacy and they have still not received the prescription.  ? ?I called and spoke with Erlene Quan. He reports that he does not have the prescription on file that was sent over by Dr. Owens Shark. Please resend prescription.  ? ?Talbot Grumbling, RN ? ?

## 2022-03-02 NOTE — Telephone Encounter (Signed)
Patient sent mychart message due to issues with picking up Suboxone rx. Called and spoke with pharmacist. Resident DEA is not authorized to send in this prescription.  ? ?Canceled current rx at [facility].  ? ?Will forward to preceptor Owens Shark) to resend.  ? ?Talbot Grumbling, RN ? ?

## 2022-03-02 NOTE — Addendum Note (Signed)
Addended byOwens Shark, Calah Gershman on: 03/02/2022 04:40 PM ? ? Modules accepted: Orders ? ?

## 2022-03-02 NOTE — Telephone Encounter (Signed)
PDMP reviewed. Sent to [facility].  ?Dorris Singh, MD  ?Family Medicine Teaching Service  ? ?

## 2022-03-02 NOTE — Addendum Note (Signed)
Addended byOwens Shark, Kaylan Yates on: 03/02/2022 01:39 PM ? ? Modules accepted: Orders ? ?

## 2022-03-09 ENCOUNTER — Other Ambulatory Visit: Payer: Self-pay | Admitting: *Deleted

## 2022-03-09 DIAGNOSIS — F1990 Other psychoactive substance use, unspecified, uncomplicated: Secondary | ICD-10-CM

## 2022-03-09 NOTE — Telephone Encounter (Signed)
Rhonda Cabrera, ? ?There was a lot of issues with me sending this prescription before.  Can I send the prescription or does not have to be an attending? ? ?Thanks, ? ?Christy Sartorius

## 2022-03-09 NOTE — Telephone Encounter (Signed)
Pt is requesting a refill on her suboxone.  She knows it is early but she wants to make sure she does not go without like last time. Christen Bame, CMA ? ?

## 2022-03-10 MED ORDER — BUPRENORPHINE HCL-NALOXONE HCL 8-2 MG SL FILM: 1.0000 | ORAL_FILM | Freq: Two times a day (BID) | SUBLINGUAL | 0 refills | Status: DC

## 2022-03-10 NOTE — Telephone Encounter (Signed)
Discussed with Dr. Erin Hearing, routing to him to handle this ?Leeanne Rio, MD  ?

## 2022-03-10 NOTE — Telephone Encounter (Signed)
Dr. Ardelia Mems, ? ?I spoke with Dr. Caron Presume and we would like to make sure this pt is not without the suboxone unnecessarily. ? ?Last time Dr. Caron Presume tried to send it in, it was not accepted.  Do you know the regulations around this? ? ?Does it have to be filled by an attending? Christen Bame, CMA ? ?

## 2022-03-10 NOTE — Telephone Encounter (Signed)
Rx prescribed for two week  ? ? ?

## 2022-03-14 ENCOUNTER — Telehealth: Payer: Self-pay | Admitting: *Deleted

## 2022-03-14 ENCOUNTER — Telehealth: Payer: Self-pay | Admitting: Pharmacist

## 2022-03-14 DIAGNOSIS — F17200 Nicotine dependence, unspecified, uncomplicated: Secondary | ICD-10-CM

## 2022-03-14 NOTE — Telephone Encounter (Signed)
Noted and agree. 

## 2022-03-14 NOTE — Telephone Encounter (Signed)
-----   Message from Leavy Cella, [place] sent at 02/23/2022  4:37 PM EST ----- ?Regarding: Tobacco Cessation F/U - Rhonda Cabrera  '7mg'$  patches ? ? ?

## 2022-03-14 NOTE — Assessment & Plan Note (Signed)
Patient contacted for follow/up of tobacco cessation attempt.  ? ?Since last contact patient reports no smoking / no slips ? ?Medications currently being used;  She admits that she has forgotten to apply her patch a few times in the last week.  Did not apply patch this AM and is not currently wearing - Nicotine Patch - '7mg'$  ?Patient denies any significant side effects from tobacco cessation therapy.  ? ?Rates IMPORTANCE of quitting tobacco as high.  ?States she is willing to stop the patches as she believes she is quit forever.  ?STOPPED Nicotine patch.  ? ?Congratulated on success.  She shared that her husband has also quit smoking.  ?Although he had a recent slip, he immediately restarted his quit and has remained smoke free as well.  ? ?Total time with patient call and documentation of interaction: 15 minutes. ? ?F/U Phone call planned: in 3 months for 6 month quit date.   ?She was encouraged to call or schedule back at any time if needed for support/assistance in the future.  ?

## 2022-03-14 NOTE — Telephone Encounter (Signed)
Patient contacted for follow/up of tobacco cessation attempt.  ? ?Since last contact patient reports no smoking / no slips ? ?Medications currently being used;  She admits that she has forgotten to apply her patch a few times in the last week.  Did not apply patch this AM and is not currently wearing - Nicotine Patch - '7mg'$  ?Patient denies any significant side effects from tobacco cessation therapy.  ? ?Rates IMPORTANCE of quitting tobacco as high.  ?States she is willing to stop the patches as she believes she is quit forever.  ?STOPPED Nicotine patch.  ? ?Congratulated on success.  She shared that her husband has also quit smoking.  ?Although he had a recent slip, he immediately restarted his quit and has remained smoke free as well.  ? ?Total time with patient call and documentation of interaction: 15 minutes. ? ?F/U Phone call planned: in 3 months for 6 month quit date.   ?She was encouraged to call or schedule back at any time if needed for support/assistance in the future.  ? ? ?

## 2022-03-14 NOTE — Telephone Encounter (Signed)
Contacted pts pharmacy to inquire about her suboxone Rx.  Pharmacy said that they had 2 on hold one from Dr. Owens Shark and one from Dr. Erin Hearing.  He discontinued the one on hold from Dr. Owens Shark and kept the one from Dr. Erin Hearing.  Stated that pt picked up last fill on 3/17 and it was to early for pt to pick up said she should be able to pick it up tomorrow.   I also contacted pt and informed her via phone of this and told her if she had any problems to give [place] a call back.Rhonda Cabrera, CMA ? ?

## 2022-03-27 ENCOUNTER — Encounter: Payer: Self-pay | Admitting: Family Medicine

## 2022-03-27 ENCOUNTER — Ambulatory Visit (INDEPENDENT_AMBULATORY_CARE_PROVIDER_SITE_OTHER): Payer: Medicare Other | Admitting: Family Medicine

## 2022-03-27 VITALS — BP 106/70 | HR 70 | Temp 98.1°F | Wt 146.0 lb

## 2022-03-27 DIAGNOSIS — F1121 Opioid dependence, in remission: Secondary | ICD-10-CM | POA: Diagnosis not present

## 2022-03-27 DIAGNOSIS — F1990 Other psychoactive substance use, unspecified, uncomplicated: Secondary | ICD-10-CM | POA: Diagnosis not present

## 2022-03-27 LAB — POCT URINE DRUG SCREEN
POC Amphetamine UR: NOT DETECTED
POC BENZODIAZEPINES UR: NOT DETECTED
POC Barbiturate UR: NOT DETECTED
POC Buprenorphine (BUP): POSITIVE — AB
POC Cocaine UR: NOT DETECTED
POC Ecstasy UR: NOT DETECTED
POC Marijuana UR: NOT DETECTED
POC Methadone UR: NOT DETECTED
POC Methamphetamine UR: NOT DETECTED
POC Opiate Ur: NOT DETECTED
POC Oxycodone UR: NOT DETECTED
POC PH URINE: NORMAL
POC PHENCYCLIDINE UR: NOT DETECTED
POC SPECIFIC GRAVITY URINE: NORMAL
URINE TEMPERATURE: 92 Degrees F (ref 90.0–100.0)

## 2022-03-27 MED ORDER — BUPRENORPHINE HCL-NALOXONE HCL 8-2 MG SL FILM: 1.0000 | ORAL_FILM | Freq: Two times a day (BID) | SUBLINGUAL | 0 refills | Status: DC

## 2022-03-27 NOTE — Patient Instructions (Signed)
It was great seeing you today.  We are collecting a urine drug screen on you and I will fill the prescription for 28 days.  I am going to send it to your pharmacy but let me know if you have any issues because it may not go through properly.  I hope you have a wonderful day and I will see you in 1 month.  Congratulations on the tobacco free for 90 days! ?

## 2022-03-27 NOTE — Progress Notes (Deleted)
? ? ?  SUBJECTIVE:  ? ?CHIEF COMPLAINT / HPI:  ? ?*** ? ?PERTINENT  PMH / PSH: *** ? ?OBJECTIVE:  ? ?BP 106/70   Pulse 70   Temp 98.1 ?F (36.7 ?C)   Wt 146 lb (66.2 kg)   SpO2 100%   BMI 22.87 kg/m?   ?*** ? ?ASSESSMENT/PLAN:  ? ?No problem-specific Assessment & Plan notes found for this encounter. ?  ? ? ?Gifford Shave, MD ?[place]  ? ?

## 2022-03-27 NOTE — Progress Notes (Signed)
? ? ?  SUBJECTIVE:  ? ?CHIEF COMPLAINT / HPI:  ? ?Opioid dependence ?Patient presents today for continued surveillance of her opiate dependence.  She has been doing well on her current dose of Suboxone.  She did have difficulty picking up her previous prescription but the issues were dealt with.  She has no concerns at this time.  Is interested in UDS today. ? ?OBJECTIVE:  ? ?BP 106/70   Pulse 70   Temp 98.1 ?F (36.7 ?C)   Wt 146 lb (66.2 kg)   SpO2 100%   BMI 22.87 kg/m?   ?General: Pleasant 36 year old female in no acute distress ?Cardiac: Regular rate ?Respiratory: Normal breathing, lungs clear to auscultation bilaterally ?MSK: No gross abnormalities ? ?ASSESSMENT/PLAN:  ? ?Opiate addiction ([place]) ?Patient continues to do well on current Suboxone prescription of 8-2 film strips twice daily.  Is interested in decreasing number of visits to monthly visits.  Feel that this is appropriate given she has been well controlled on this current dose.  Prescription sent for 28-day supply.  UDS today positive for Suboxone but no other substances found.  Follow-up in 1 month. ?  ? ? ?Gifford Shave, MD ?[place]  ? ?

## 2022-03-28 MED ORDER — BUPRENORPHINE HCL-NALOXONE HCL 8-2 MG SL FILM: 1.0000 | ORAL_FILM | Freq: Two times a day (BID) | SUBLINGUAL | 0 refills | Status: DC

## 2022-03-28 NOTE — Assessment & Plan Note (Signed)
Patient continues to do well on current Suboxone prescription of 8-2 film strips twice daily.  Is interested in decreasing number of visits to monthly visits.  Feel that this is appropriate given she has been well controlled on this current dose.  Prescription sent for 28-day supply.  UDS today positive for Suboxone but no other substances found.  Follow-up in 1 month. ?

## 2022-03-28 NOTE — Progress Notes (Signed)
PDMP reviewed. Rx for suboxone sent. ? ?Dorris Singh, MD  ?Family Medicine Teaching Service  ? ?

## 2022-04-26 ENCOUNTER — Encounter: Payer: Self-pay | Admitting: Family Medicine

## 2022-04-28 ENCOUNTER — Ambulatory Visit (INDEPENDENT_AMBULATORY_CARE_PROVIDER_SITE_OTHER): Payer: Medicare Other | Admitting: Family Medicine

## 2022-04-28 DIAGNOSIS — F1121 Opioid dependence, in remission: Secondary | ICD-10-CM

## 2022-04-28 DIAGNOSIS — N926 Irregular menstruation, unspecified: Secondary | ICD-10-CM | POA: Diagnosis not present

## 2022-04-28 DIAGNOSIS — F1990 Other psychoactive substance use, unspecified, uncomplicated: Secondary | ICD-10-CM

## 2022-04-28 LAB — POCT URINE DRUG SCREEN
POC Amphetamine UR: NOT DETECTED
POC BENZODIAZEPINES UR: NOT DETECTED
POC Barbiturate UR: NOT DETECTED
POC Buprenorphine (BUP): POSITIVE — AB
POC Cocaine UR: NOT DETECTED
POC Ecstasy UR: NOT DETECTED
POC Marijuana UR: NOT DETECTED
POC Methadone UR: NOT DETECTED
POC Methamphetamine UR: NOT DETECTED
POC Opiate Ur: NOT DETECTED
POC Oxycodone UR: NOT DETECTED
POC PH URINE: NORMAL
POC PHENCYCLIDINE UR: NOT DETECTED
POC SPECIFIC GRAVITY URINE: NORMAL
URINE TEMPERATURE: 92 Degrees F (ref 90.0–100.0)

## 2022-04-28 LAB — POCT URINE PREGNANCY: Preg Test, Ur: NEGATIVE

## 2022-04-28 MED ORDER — BUPRENORPHINE HCL-NALOXONE HCL 8-2 MG SL FILM: 1.0000 | ORAL_FILM | Freq: Two times a day (BID) | SUBLINGUAL | 0 refills | Status: DC

## 2022-04-28 NOTE — Patient Instructions (Signed)
It was great seeing you today.  I am sorry her daughter is giving you a hard time.  Regarding your Suboxone I have refilled this.  Congratulations on not smoking for 4 months!  Let me know if you have any issues or concerns.  I will see you in 1 month. ?

## 2022-04-28 NOTE — Assessment & Plan Note (Signed)
Currently doing well on 8-2 Suboxone film strips twice daily.  UDS today positive for Suboxone but no other substances.  Monthly prescription for Suboxone sent to patient's pharmacy.  Follow-up in 1 month. ?

## 2022-04-28 NOTE — Progress Notes (Signed)
? ? ?  SUBJECTIVE:  ? ?CHIEF COMPLAINT / HPI:  ? ?Opiate dependence ?Patient currently doing well on Suboxone prescription.  Remains clean denies any relapses since last being seen.  Is also pleased because she has been tobacco free for 4 months. ? ?Abnormal uterine bleeding ?Patient reports that since she was last seen she has not had her period.  She is sexually active without protection but has history of a tubal ligation with Filshie clips.  Reports her last period was extremely light and she should have started her period a few days ago but is a little late.  Prior to this her menstrual cycles were normal. ? ?OBJECTIVE:  ? ?There were no vitals taken for this visit.  ?General: Pleasant 36 year old female in no acute distress ?Cardiac: Regular rate and rhythm, no murmurs appreciated ?Respiratory: Breathing, lungs clear auscultation bilaterally ?MSK: No gross abnormalities ? ?ASSESSMENT/PLAN:  ? ?Opiate addiction ([place]) ?Currently doing well on 8-2 Suboxone film strips twice daily.  UDS today positive for Suboxone but no other substances.  Monthly prescription for Suboxone sent to patient's pharmacy.  Follow-up in 1 month. ? ?Missed menses ?Patient presents reporting she is a few days late on her period.  She does have tubal ligation in the past with Filshie clips.  Reports her menses are usually normal.  Pregnancy test negative today.  Discussed watchful waiting and if does not come on in the next few days plan for follow-up. ?  ? ? ?Gifford Shave, MD ?[place]  ? ?

## 2022-04-28 NOTE — Assessment & Plan Note (Signed)
Patient presents reporting she is a few days late on her period.  She does have tubal ligation in the past with Filshie clips.  Reports her menses are usually normal.  Pregnancy test negative today.  Discussed watchful waiting and if does not come on in the next few days plan for follow-up. ?

## 2022-04-30 ENCOUNTER — Telehealth: Payer: Self-pay | Admitting: Family Medicine

## 2022-04-30 NOTE — Telephone Encounter (Addendum)
AFTER HOURS EMERGENCY LINE CALL ? ?Patient reports her suboxone films are not available at the pharmacy. She expresses frustration because she is experiencing interruptions to her suboxone medication and experiencing withdrawal onset.  ? ?Chart review indicates receipt confirmed by pharmacy 04/28/22 4:18pm. Patient says the pharmacy reports they do not have the correct order. Previously, there was a problem with a resident physician sending the script, so an attending has been signing the refills.  ? ?Will forward to PCP, RN pool, and attending who prescribed for resolution.  ? ?Ezequiel Essex, MD ? ?

## 2022-05-01 ENCOUNTER — Other Ambulatory Visit: Payer: Self-pay | Admitting: Family Medicine

## 2022-05-01 DIAGNOSIS — F1990 Other psychoactive substance use, unspecified, uncomplicated: Secondary | ICD-10-CM

## 2022-05-01 MED ORDER — BUPRENORPHINE HCL-NALOXONE HCL 8-2 MG SL FILM: 1.0000 | ORAL_FILM | Freq: Two times a day (BID) | SUBLINGUAL | 0 refills | Status: DC

## 2022-05-01 NOTE — Progress Notes (Signed)
Reatt3empt to send Suboxone prescription for patient as the prescription for the Suboxone sent by Dr Thompson Grayer was not able to be filled by the pharmacy. ?Interestingly, while patient has a record in [place] of having received multiple monthly prescriptions for Suboxone from our practice, I was unable to locate them on PDMP.  ?

## 2022-05-17 ENCOUNTER — Encounter: Payer: Self-pay | Admitting: Family Medicine

## 2022-05-23 ENCOUNTER — Encounter: Payer: Self-pay | Admitting: *Deleted

## 2022-05-25 ENCOUNTER — Encounter: Payer: Self-pay | Admitting: Family Medicine

## 2022-05-25 ENCOUNTER — Ambulatory Visit (INDEPENDENT_AMBULATORY_CARE_PROVIDER_SITE_OTHER): Payer: Medicare Other | Admitting: Family Medicine

## 2022-05-25 VITALS — BP 112/69 | HR 71 | Ht 67.0 in | Wt 150.2 lb

## 2022-05-25 DIAGNOSIS — F1990 Other psychoactive substance use, unspecified, uncomplicated: Secondary | ICD-10-CM

## 2022-05-25 DIAGNOSIS — F1121 Opioid dependence, in remission: Secondary | ICD-10-CM | POA: Diagnosis not present

## 2022-05-25 LAB — POCT URINE DRUG SCREEN
POC Amphetamine UR: NOT DETECTED
POC BENZODIAZEPINES UR: NOT DETECTED
POC Barbiturate UR: NOT DETECTED
POC Buprenorphine (BUP): POSITIVE — AB
POC Cocaine UR: NOT DETECTED
POC Ecstasy UR: NOT DETECTED
POC Marijuana UR: NOT DETECTED
POC Methadone UR: NOT DETECTED
POC Methamphetamine UR: NOT DETECTED
POC Opiate Ur: NOT DETECTED
POC Oxycodone UR: NOT DETECTED
POC PH URINE: NORMAL
POC PHENCYCLIDINE UR: NOT DETECTED
POC SPECIFIC GRAVITY URINE: NORMAL
URINE TEMPERATURE: 94 Degrees F (ref 90.0–100.0)

## 2022-05-25 MED ORDER — BUPRENORPHINE HCL-NALOXONE HCL 8-2 MG SL FILM: 1.0000 | ORAL_FILM | Freq: Two times a day (BID) | SUBLINGUAL | 0 refills | Status: DC

## 2022-05-25 NOTE — Patient Instructions (Signed)
It was great seeing you today and wonderful taking care of you for the past few years.  I also enjoyed taking care of your family.  I have refilled your medications.  Please follow-up in 1 month for repeat screening and I can refill your meds.  I hope you have a wonderful afternoon and if you need anything before the 30th of the month please call the clinic and I will give you a call.

## 2022-05-25 NOTE — Progress Notes (Signed)
    SUBJECTIVE:   CHIEF COMPLAINT / HPI:   Medication management Patient is presenting today for refill on Suboxone medications.  Reports that the medication is doing well and she is tolerating the current dose.  She remains to be free of other sepsis.  She would like to continue on her current regimen.  She has no other issues, questions, concerns at this time.   OBJECTIVE:   BP 112/69   Pulse 71   Ht '5\' 7"'$  (1.702 m)   Wt 150 lb 4 oz (68.2 kg)   SpO2 100%   BMI 23.53 kg/m   General: Pleasant well-appearing 36 year old female in no acute distress Cardiac: Regular rate Respiratory: Normal work of breathing, speaking full sentences MSK: No gross abnormalities Psych: Normal mood, cheerful  ASSESSMENT/PLAN:   Opiate addiction ([facility]) Doing well on 8-2 Suboxone film strips twice daily.  UDS positive for Suboxone but negative for other substances.  Sent in monthly prescription for Suboxone to patient's pharmacy.  Follow-up in 1 month.     Gifford Shave, MD [place]

## 2022-05-25 NOTE — Assessment & Plan Note (Signed)
Doing well on 8-2 Suboxone film strips twice daily.  UDS positive for Suboxone but negative for other substances.  Sent in monthly prescription for Suboxone to patient's pharmacy.  Follow-up in 1 month.

## 2022-06-07 ENCOUNTER — Ambulatory Visit: Payer: Medicare Other | Admitting: Family Medicine

## 2022-06-26 ENCOUNTER — Encounter: Payer: Self-pay | Admitting: Student

## 2022-06-26 ENCOUNTER — Other Ambulatory Visit: Payer: Self-pay | Admitting: Student

## 2022-06-26 ENCOUNTER — Telehealth: Payer: Self-pay

## 2022-06-26 DIAGNOSIS — F1990 Other psychoactive substance use, unspecified, uncomplicated: Secondary | ICD-10-CM

## 2022-06-26 MED ORDER — BUPRENORPHINE HCL-NALOXONE HCL 8-2 MG SL FILM: 1.0000 | ORAL_FILM | Freq: Two times a day (BID) | SUBLINGUAL | 0 refills | Status: DC

## 2022-06-26 NOTE — Progress Notes (Signed)
Refilled Med. Patient has follow up appointment  with me for tomorrow

## 2022-06-26 NOTE — Telephone Encounter (Signed)
Patient LVM on nurse line regarding previous mychart message. Patient reports that she is completely out of suboxone as previous PCP wrote for 28 day supply.   Patient is worried as she is out of medication and does not want to start having withdrawal symptoms.   Patient has appointment for tomorrow.   Forwarding to PCP for further management.   Talbot Grumbling, RN

## 2022-06-27 ENCOUNTER — Ambulatory Visit (INDEPENDENT_AMBULATORY_CARE_PROVIDER_SITE_OTHER): Payer: Medicare Other | Admitting: Student

## 2022-06-27 ENCOUNTER — Encounter: Payer: Self-pay | Admitting: Student

## 2022-06-27 VITALS — BP 119/83 | HR 62 | Wt 152.6 lb

## 2022-06-27 DIAGNOSIS — F1121 Opioid dependence, in remission: Secondary | ICD-10-CM

## 2022-06-27 LAB — POCT URINE DRUG SCREEN
POC Amphetamine UR: NOT DETECTED
POC BENZODIAZEPINES UR: NOT DETECTED
POC Barbiturate UR: NOT DETECTED
POC Buprenorphine (BUP): POSITIVE — AB
POC Cocaine UR: NOT DETECTED
POC Ecstasy UR: NOT DETECTED
POC Marijuana UR: NOT DETECTED
POC Methadone UR: NOT DETECTED
POC Methamphetamine UR: NOT DETECTED
POC Opiate Ur: NOT DETECTED
POC Oxycodone UR: NOT DETECTED
POC PH URINE: NORMAL
POC PHENCYCLIDINE UR: NOT DETECTED
POC SPECIFIC GRAVITY URINE: NORMAL
URINE TEMPERATURE: 94 Degrees F (ref 90.0–100.0)

## 2022-06-27 NOTE — Progress Notes (Signed)
    SUBJECTIVE:   CHIEF COMPLAINT / HPI:   36-year-old female on Suboxone for 5 years. Was on Methdone but didn't like that she's daily dosed for it and also the sedative effect.b She feels normal on suboxone. Concerned that sometimes there is delay with refill in the past and she gets flu like symptoms which include diarrhea, running nose, shivering.. Expressed long term goal is to slowly wean off. Patient denies any dizziness, blurry vision, drowsiness, nausea, vomiting, increased sweating or insomnia.  Haven't smoked cigarettes in 6 months. And has been doing well with the nicotine patch which has helped her significantly. Currently managed by pharmacy and she expressed happiness from quitting smoking.   PERTINENT  PMH / PSH: MDD, bipolar, opiate addiction  OBJECTIVE:   BP 119/83   Pulse 62   Wt 152 lb 9.6 oz (69.2 kg)   SpO2 100%   BMI 23.90 kg/m    Physical Exam General: Alert, well appearing, NAD,  Cardiovascular: RRR, No Murmurs, Normal S2/S2 Respiratory: CTAB, No wheezing or Rales   ASSESSMENT/PLAN:   Opiate addiction ([place]) Patient with history of opioid addiction on remission currently on chronic Suboxone treatment.  Medically stable on treatment without any side effects or withdrawal symptoms.  Obtained today was positive for Suboxone and negative for other substances.  Medication refilled yesterday. Discussed plan to follow-up in 25 days to insure no gaps in refill which patient is agreeable to.     Alen Bleacher, MD [place]

## 2022-06-27 NOTE — Assessment & Plan Note (Signed)
Patient with history of opioid addiction on remission currently on chronic Suboxone treatment.  Medically stable on treatment without any side effects or withdrawal symptoms.  Obtained today was positive for Suboxone and negative for other substances.  Medication refilled yesterday. Discussed plan to follow-up in 25 days to insure no gaps in refill which patient is agreeable to.

## 2022-06-27 NOTE — Patient Instructions (Addendum)
It was wonderful to meet you today. Thank you for allowing me to be a part of your care. Below is a short summary of what we discussed at your visit today:  Your drug screen was clean and only positive for Suboxone.  Medication was refilled yesterday  Follow up in 25 days or earlier as needed  If you have any questions or concerns, please do not hesitate to contact us via phone or MyChart message.   Alen Bleacher, MD [facility]

## 2022-07-04 ENCOUNTER — Telehealth: Payer: Self-pay | Admitting: Pharmacist

## 2022-07-04 DIAGNOSIS — F17200 Nicotine dependence, unspecified, uncomplicated: Secondary | ICD-10-CM

## 2022-07-04 NOTE — Telephone Encounter (Signed)
Patient contacted for follow/up of tobacco cessation success for 6 months.  Since last contact patient reports she has remained completely abstinent from tobacco AND believes she has reached 190 days of smoke free.   Medications currently being used; None Continues to rate CONFIDENCE of remaining quit from tobacco long-term as high.   Most common triggers to use tobacco include; stress, school.    Motivation to stay quit: health.  States husband has been doing well with tobacco abstinence as well as alcohol (quit for 4 months and going to [place]).    Total time with patient call and documentation of interaction: 12 minutes. Follow-up phone call planned: 1 year quit date (in 6  months).

## 2022-07-04 NOTE — Telephone Encounter (Signed)
-----   Message from Leavy Cella, [place] sent at 03/14/2022  4:34 PM EDT ----- Regarding: 6 month Tobacco F/U - Husband also quit?

## 2022-07-04 NOTE — Telephone Encounter (Signed)
Noted and agree. 

## 2022-07-04 NOTE — Assessment & Plan Note (Signed)
Patient contacted for follow/up of tobacco cessation success for 6 months.  Since last contact patient reports she has remained completely abstinent from tobacco AND believes she has reached 190 days of smoke free.   Medications currently being used; None Continues to rate CONFIDENCE of remaining quit from tobacco long-term as high.   Most common triggers to use tobacco include; stress, school.    Motivation to stay quit: health.  States husband has been doing well with tobacco abstinence as well as alcohol (quit for 4 months and going to [place]).    Total time with patient call and documentation of interaction: 12 minutes. Follow-up phone call planned: 1 year quit date (in 6  months).

## 2022-07-05 ENCOUNTER — Emergency Department (HOSPITAL_BASED_OUTPATIENT_CLINIC_OR_DEPARTMENT_OTHER): Payer: Medicare Other

## 2022-07-05 ENCOUNTER — Emergency Department (HOSPITAL_BASED_OUTPATIENT_CLINIC_OR_DEPARTMENT_OTHER)
Admission: EM | Admit: 2022-07-05 | Discharge: 2022-07-05 | Disposition: A | Discharge status: Home / Self Care | Payer: Medicare Other | Attending: Emergency Medicine | Admitting: Emergency Medicine

## 2022-07-05 ENCOUNTER — Other Ambulatory Visit: Payer: Self-pay

## 2022-07-05 ENCOUNTER — Encounter (HOSPITAL_BASED_OUTPATIENT_CLINIC_OR_DEPARTMENT_OTHER): Payer: Self-pay | Admitting: Emergency Medicine

## 2022-07-05 DIAGNOSIS — G43809 Other migraine, not intractable, without status migrainosus: Secondary | ICD-10-CM | POA: Diagnosis not present

## 2022-07-05 DIAGNOSIS — R519 Headache, unspecified: Secondary | ICD-10-CM | POA: Diagnosis present

## 2022-07-05 DIAGNOSIS — S0990XA Unspecified injury of head, initial encounter: Secondary | ICD-10-CM | POA: Diagnosis not present

## 2022-07-05 MED ORDER — DIPHENHYDRAMINE HCL 50 MG/ML IJ SOLN
25.0000 mg | Freq: Once | INTRAMUSCULAR | Status: AC
  Administered 2022-07-05: 25 mg via INTRAVENOUS
  Filled 2022-07-05: qty 1

## 2022-07-05 MED ORDER — DEXAMETHASONE SODIUM PHOSPHATE 10 MG/ML IJ SOLN
10.0000 mg | Freq: Once | INTRAMUSCULAR | Status: AC
  Administered 2022-07-05: 10 mg via INTRAVENOUS
  Filled 2022-07-05: qty 1

## 2022-07-05 MED ORDER — PROCHLORPERAZINE EDISYLATE 10 MG/2ML IJ SOLN
10.0000 mg | Freq: Once | INTRAMUSCULAR | Status: AC
  Administered 2022-07-05: 10 mg via INTRAVENOUS
  Filled 2022-07-05: qty 2

## 2022-07-05 NOTE — Discharge Instructions (Signed)
Continue Aleve and Tylenol

## 2022-07-05 NOTE — ED Triage Notes (Signed)
Pt c/o sharp pain in head x 3 days.

## 2022-07-05 NOTE — ED Provider Notes (Signed)
/To [facility] Provider Note   CSN: [id number] Arrival date & time: 07/05/22  2102     History  Chief Complaint  Patient presents with   Headache    Rhonda Cabrera is a 36 y.o. female.  Patient here with left-sided headache for the last several days.  History of migraines, bipolar, polysubstance abuse.  States feels little bit different than her typical migraines.  Has been a long time since she had migraines.  She took some Aleve with improvement.  Denies any fever, neck pain, nausea, vomiting.  States that she has been under a lot of stress.  Nothing makes it worse.  Denies any weakness, numbness, chest pain, shortness of breath, abdominal pain  The history is provided by the patient.       Home Medications Prior to Admission medications   Medication Sig Start Date End Date Taking? Authorizing Provider  amoxicillin-clavulanate (AUGMENTIN) 875-125 MG tablet Take 1 tablet by mouth every 12 (twelve) hours. 02/15/22   Etta Quill, NP  Biotin 1 MG CAPS Take by mouth.    [provider]  Buprenorphine HCl-Naloxone HCl 8-2 MG FILM Place 1 Film under the tongue 2 (two) times daily. 06/26/22   Alen Bleacher, MD  gabapentin (NEURONTIN) 100 MG capsule Take 1 capsule (100 mg total) by mouth 3 (three) times daily. 03/24/21   Matilde Haymaker, MD  hydrocortisone (ANUSOL-HC) 2.5 % rectal cream Place 1 application rectally 2 (two) times daily. 10/25/19   Daisy Floro, DO  methocarbamol (ROBAXIN) 500 MG tablet Take 1 tablet (500 mg total) by mouth 2 (two) times daily. 03/18/21   Sponseller, Gypsy Balsam, PA-C  Multiple Vitamin (MULTIVITAMIN) capsule Take 1 capsule by mouth daily.    [provider]  polyethylene glycol powder (GLYCOLAX/MIRALAX) 17 GM/SCOOP powder Take 17 g by mouth 2 (two) times daily as needed. 07/01/19   Lind Covert, MD      Allergies    Seroquel xr [quetiapine fumarate er]    Review of Systems   Review of  Systems  Physical Exam Updated Vital Signs BP 119/86 (BP Location: Right Arm)   Pulse (!) 56   Temp 99.2 F (37.3 C) (Oral)   Resp 18   Ht '5\' 7"'$  (1.702 m)   Wt 68.9 kg   SpO2 100%   BMI 23.81 kg/m  Physical Exam Vitals and nursing note reviewed.  Constitutional:      General: She is not in acute distress.    Appearance: She is well-developed.  HENT:     Head: Normocephalic and atraumatic.  Eyes:     General: No visual field deficit.    Extraocular Movements: Extraocular movements intact.     Conjunctiva/sclera: Conjunctivae normal.     Pupils: Pupils are equal, round, and reactive to light.  Cardiovascular:     Rate and Rhythm: Normal rate and regular rhythm.     Heart sounds: No murmur heard. Pulmonary:     Effort: Pulmonary effort is normal. No respiratory distress.     Breath sounds: Normal breath sounds.  Abdominal:     Palpations: Abdomen is soft.     Tenderness: There is no abdominal tenderness.  Musculoskeletal:        General: No swelling.     Cervical back: Neck supple.  Skin:    General: Skin is warm and dry.     Capillary Refill: Capillary refill takes less than 2 seconds.  Neurological:     Mental  Status: She is alert and oriented to person, place, and time.     Cranial Nerves: No cranial nerve deficit.     Sensory: No sensory deficit.     Coordination: Coordination normal.     Comments: 5+5 strength throughout, normal sensation, no drift, normal finger-nose-finger, normal speech  Psychiatric:        Mood and Affect: Mood normal.     ED Results / Procedures / Treatments   Labs (all labs ordered are listed, but only abnormal results are displayed) Labs Reviewed - No data to display  EKG None  Radiology CT Head Wo Contrast  Result Date: 07/05/2022 CLINICAL DATA:  Head trauma, moderate-severe EXAM: CT HEAD WITHOUT CONTRAST TECHNIQUE: Contiguous axial images were obtained from the base of the skull through the vertex without intravenous contrast.  RADIATION DOSE REDUCTION: This exam was performed according to the departmental dose-optimization program which includes automated exposure control, adjustment of the mA and/or kV according to patient size and/or use of iterative reconstruction technique. COMPARISON:  None Available. FINDINGS: Brain: Normal anatomic configuration. No abnormal intra or extra-axial mass lesion or fluid collection. No abnormal mass effect or midline shift. No evidence of acute intracranial hemorrhage or infarct. Ventricular size is normal. Cerebellum unremarkable. Vascular: Unremarkable Skull: Intact Sinuses/Orbits: Paranasal sinuses are clear. Orbits are unremarkable. Other: Mastoid air cells and middle ear cavities are clear. IMPRESSION: No acute intracranial abnormality. No calvarial fracture. Electronically Signed   By: Fidela Salisbury M.D.   On: 07/05/2022 21:36    Procedures Procedures    Medications Ordered in ED Medications  prochlorperazine (COMPAZINE) injection 10 mg (10 mg Intravenous Given 07/05/22 2142)  diphenhydrAMINE (BENADRYL) injection 25 mg (25 mg Intravenous Given 07/05/22 2139)  dexamethasone (DECADRON) injection 10 mg (10 mg Intravenous Given 07/05/22 2141)    ED Course/ Medical Decision Making/ A&P                           Medical Decision Making Amount and/or Complexity of Data Reviewed Radiology: ordered.  Risk Prescription drug management.   [name] is here with headache.  Normal vitals.  No fever.  Overall differential is migraine type headache.  She admits a lot of stress.  Shared decision was made to get a head CT because it feels different than her migraines in the past.  Neurologically she is intact.  We will do headache cocktail with Compazine, Benadryl, Decadron.  Patient just finished her menstrual cycle.  Denies any infectious symptoms.  Have no concern for meningitis or other acute process.  Head CT per my review shows no acute findings.  Feeling better after  headache cocktail.  Overall suspect migraine/tension type headache.  Given reassurance.  Discharged in good condition.  Recommend continued use of Tylenol and ibuprofen and rest.  This chart was dictated using voice recognition software.  Despite best efforts to proofread,  errors can occur which can change the documentation meaning.         Final Clinical Impression(s) / ED Diagnoses Final diagnoses:  Other migraine without status migrainosus, not intractable    Rx / DC Orders ED Discharge Orders     None         Lennice Sites, DO 07/05/22 2146

## 2022-07-20 ENCOUNTER — Encounter: Payer: Self-pay | Admitting: Student

## 2022-07-20 ENCOUNTER — Ambulatory Visit (INDEPENDENT_AMBULATORY_CARE_PROVIDER_SITE_OTHER): Payer: Medicare Other | Admitting: Student

## 2022-07-20 ENCOUNTER — Other Ambulatory Visit (HOSPITAL_COMMUNITY)
Admission: RE | Admit: 2022-07-20 | Discharge: 2022-07-20 | Disposition: A | Discharge status: Home / Self Care | Payer: Medicare Other | Source: Ambulatory Visit | Attending: Family Medicine | Admitting: Family Medicine

## 2022-07-20 VITALS — BP 108/68 | HR 78 | Ht 67.0 in | Wt 155.0 lb

## 2022-07-20 DIAGNOSIS — F1990 Other psychoactive substance use, unspecified, uncomplicated: Secondary | ICD-10-CM

## 2022-07-20 DIAGNOSIS — N898 Other specified noninflammatory disorders of vagina: Secondary | ICD-10-CM | POA: Diagnosis present

## 2022-07-20 LAB — POCT WET PREP (WET MOUNT)
Clue Cells Wet Prep Whiff POC: NEGATIVE
Trichomonas Wet Prep HPF POC: ABSENT

## 2022-07-20 MED ORDER — BUPRENORPHINE HCL-NALOXONE HCL 8-2 MG SL FILM: 1.0000 | ORAL_FILM | Freq: Two times a day (BID) | SUBLINGUAL | 0 refills | Status: DC

## 2022-07-20 NOTE — Assessment & Plan Note (Signed)
Patient is on Suboxone therapy for over 5 years and reports good tolerance.  Denies any side effects. -Reviewed PDMP -Refilled Suboxone -Follow-up in a month

## 2022-07-20 NOTE — Progress Notes (Signed)
    SUBJECTIVE:   CHIEF COMPLAINT / HPI:   36 year old female presents for suboxone refills. Denies any side effect and any recent drug use. She recently had a recent ED visit for 4 days of headache and was treated with migraine cocktail which provided significant relieve.  Patient expressed long term goal is to slowly wean off Suboxone at some point.  She has about 6 tablets left and we will call and to review once she runs out.  Patient report having vaginal irritation with discharge and mild odor. Vaginal irritation is not only with urination. She endorses using Dove soap in the past but recently changed soap about a month ago. Her symptoms started about 2 weeks ago. She has one sexual partner, who is her husband for 15 years and they have unprotected sex. Denies any recent use of vaginal rash or bleeding.  PERTINENT  PMH / PSH: Migraine, MDD, tobacco dependence.  OBJECTIVE:   BP 108/68   Pulse 78   Ht '5\' 7"'$  (1.702 m)   Wt 155 lb (70.3 kg)   LMP 06/22/2022   SpO2 99%   BMI 24.28 kg/m    Physical Exam General: Alert, well appearing, NAD, Oriented x4 Cardiovascular: RRR, No Murmurs, Normal S2/S2 Respiratory: CTAB, No wheezing or Rales Abdomen: No distension or tenderness Extremities: No edema on extremities   Skin: Warm and dry  ASSESSMENT/PLAN:   Opiate addiction ([place]) Patient is on Suboxone therapy for over 5 years and reports good tolerance.  Denies any side effects. -Reviewed PDMP -Refilled Suboxone -Follow-up in a month  Vaginal irritation Patient who is sexually active with 1 partner reports vaginal irritation, order and discharge.  Initial diagnosis concerning for GC, chlamydia, bacterial vaginosis or Candida.  Obtained POCT wet prep was negative for trichomoniasis and clue cells.  We will follow-up with GC and chlamydia.   Alen Bleacher, MD [place]

## 2022-07-20 NOTE — Patient Instructions (Signed)
It was wonderful to meet you today. Thank you for allowing me to be a part of your care. Below is a short summary of what we discussed at your visit today:  Due to vaginal irritation, discharge and order we obtained a wet prep today to check for gonorrhea, chlamydia, and bacterial vaginosis.  Follow-up with you if your result is positive.  Call a day ahead before your last pill to have your Suboxone refilled.  Follow-up in a month  If you have any questions or concerns, please do not hesitate to contact us via phone or MyChart message.   Alen Bleacher, MD [facility]

## 2022-07-24 LAB — CERVICOVAGINAL ANCILLARY ONLY
Chlamydia: NEGATIVE
Comment: NEGATIVE
Comment: NORMAL
Neisseria Gonorrhea: NEGATIVE

## 2022-07-26 NOTE — Telephone Encounter (Signed)
Called patient to discuss the results of her recent test.  Informed patient results were negative for gonorrhea, chlamydia, trichomoniasis and clue cells.  Explained to patient that the many bacteria found on the wet prep is a normal finding represents normal bacterial flora.  Planes no treatment needed at this time and patient was amendable to plan.

## 2022-07-31 ENCOUNTER — Emergency Department (HOSPITAL_BASED_OUTPATIENT_CLINIC_OR_DEPARTMENT_OTHER)
Admission: EM | Admit: 2022-07-31 | Discharge: 2022-07-31 | Disposition: A | Discharge status: Home / Self Care | Payer: Medicare Other | Attending: Emergency Medicine | Admitting: Emergency Medicine

## 2022-07-31 ENCOUNTER — Encounter (HOSPITAL_BASED_OUTPATIENT_CLINIC_OR_DEPARTMENT_OTHER): Payer: Self-pay | Admitting: Pediatrics

## 2022-07-31 ENCOUNTER — Other Ambulatory Visit: Payer: Self-pay

## 2022-07-31 DIAGNOSIS — R350 Frequency of micturition: Secondary | ICD-10-CM | POA: Diagnosis present

## 2022-07-31 DIAGNOSIS — N390 Urinary tract infection, site not specified: Secondary | ICD-10-CM | POA: Diagnosis not present

## 2022-07-31 LAB — URINALYSIS, ROUTINE W REFLEX MICROSCOPIC
Bilirubin Urine: NEGATIVE
Glucose, UA: NEGATIVE mg/dL
Hgb urine dipstick: NEGATIVE
Ketones, ur: NEGATIVE mg/dL
Nitrite: NEGATIVE
Protein, ur: NEGATIVE mg/dL
Specific Gravity, Urine: >=1.03 (ref 1.005–1.030)
pH: 6 (ref 5.0–8.0)

## 2022-07-31 LAB — URINALYSIS, MICROSCOPIC (REFLEX)

## 2022-07-31 LAB — PREGNANCY, URINE: Preg Test, Ur: NEGATIVE

## 2022-07-31 MED ORDER — FLUCONAZOLE 150 MG PO TABS: 150.0000 mg | ORAL_TABLET | Freq: Once | ORAL | 0 refills | Status: AC

## 2022-07-31 MED ORDER — CEPHALEXIN 500 MG PO CAPS: 500.0000 mg | ORAL_CAPSULE | Freq: Two times a day (BID) | ORAL | 0 refills | Status: DC

## 2022-07-31 NOTE — ED Triage Notes (Signed)
C/O urinary freq. X 11 days; concern for UTI

## 2022-07-31 NOTE — ED Provider Notes (Signed)
[facility] Provider Note   CSN: [id number] Arrival date & time: 07/31/22  1857     History  Chief Complaint  Patient presents with   Urinary Frequency    Rhonda Cabrera is a 36 y.o. female.  Patient is a 36 year old female who presents with urinary frequency.  She says been going on for about 11 days.  She has had UTIs in the past and says this feels similar.  She initially had had a little bit of an odor to her vagina and she saw her PCP on August 3 and was tested for STDs which she says was negative.  Wet prep was negative.  She feels like she has a UTI.  She denies any abdominal pain.  No fevers.  No nausea or vomiting.       Home Medications Prior to Admission medications   Medication Sig Start Date End Date Taking? Authorizing Provider  cephALEXin (KEFLEX) 500 MG capsule Take 1 capsule (500 mg total) by mouth 2 (two) times daily. 07/31/22  Yes Malvin Johns, MD  fluconazole (DIFLUCAN) 150 MG tablet Take 1 tablet (150 mg total) by mouth once for 1 dose. 07/31/22 07/31/22 Yes Malvin Johns, MD  Biotin 1 MG CAPS Take by mouth.    [provider]  Buprenorphine HCl-Naloxone HCl 8-2 MG FILM Place 1 Film under the tongue 2 (two) times daily. 07/20/22   Alen Bleacher, MD  gabapentin (NEURONTIN) 100 MG capsule Take 1 capsule (100 mg total) by mouth 3 (three) times daily. 03/24/21   Matilde Haymaker, MD  hydrocortisone (ANUSOL-HC) 2.5 % rectal cream Place 1 application rectally 2 (two) times daily. 10/25/19   Daisy Floro, DO  methocarbamol (ROBAXIN) 500 MG tablet Take 1 tablet (500 mg total) by mouth 2 (two) times daily. 03/18/21   Sponseller, Gypsy Balsam, PA-C  Multiple Vitamin (MULTIVITAMIN) capsule Take 1 capsule by mouth daily.    [provider]  polyethylene glycol powder (GLYCOLAX/MIRALAX) 17 GM/SCOOP powder Take 17 g by mouth 2 (two) times daily as needed. 07/01/19   Lind Covert, MD      Allergies    Seroquel xr  [quetiapine fumarate er]    Review of Systems   Review of Systems  Constitutional:  Negative for chills, diaphoresis, fatigue and fever.  HENT:  Negative for congestion, rhinorrhea and sneezing.   Eyes: Negative.   Respiratory:  Negative for cough, chest tightness and shortness of breath.   Cardiovascular:  Negative for chest pain and leg swelling.  Gastrointestinal:  Negative for abdominal pain, blood in stool, diarrhea, nausea and vomiting.  Genitourinary:  Positive for frequency. Negative for difficulty urinating, flank pain and hematuria.  Musculoskeletal:  Negative for arthralgias and back pain.  Skin:  Negative for rash.  Neurological:  Negative for dizziness, speech difficulty, weakness, numbness and headaches.    Physical Exam Updated Vital Signs BP 117/80 (BP Location: Left Arm)   Pulse 74   Temp 98.1 F (36.7 C) (Oral)   Resp 18   Ht '5\' 7"'$  (1.702 m)   Wt 70.3 kg   LMP 06/22/2022   SpO2 100%   BMI 24.28 kg/m  Physical Exam Constitutional:      Appearance: She is well-developed.  HENT:     Head: Normocephalic and atraumatic.  Eyes:     Pupils: Pupils are equal, round, and reactive to light.  Cardiovascular:     Rate and Rhythm: Normal rate and regular rhythm.  Heart sounds: Normal heart sounds.  Pulmonary:     Effort: Pulmonary effort is normal. No respiratory distress.     Breath sounds: Normal breath sounds. No wheezing or rales.  Chest:     Chest wall: No tenderness.  Abdominal:     General: Bowel sounds are normal.     Palpations: Abdomen is soft.     Tenderness: There is no abdominal tenderness. There is no guarding or rebound.  Musculoskeletal:        General: Normal range of motion.     Cervical back: Normal range of motion and neck supple.  Lymphadenopathy:     Cervical: No cervical adenopathy.  Skin:    General: Skin is warm and dry.     Findings: No rash.  Neurological:     Mental Status: She is alert and oriented to person, place, and  time.     ED Results / Procedures / Treatments   Labs (all labs ordered are listed, but only abnormal results are displayed) Labs Reviewed  URINALYSIS, ROUTINE W REFLEX MICROSCOPIC - Abnormal; Notable for the following components:      Result Value   APPearance HAZY (*)    Leukocytes,Ua TRACE (*)    All other components within normal limits  URINALYSIS, MICROSCOPIC (REFLEX) - Abnormal; Notable for the following components:   Bacteria, UA MANY (*)    All other components within normal limits  URINE CULTURE  PREGNANCY, URINE    EKG None  Radiology No results found.  Procedures Procedures    Medications Ordered in ED Medications - No data to display  ED Course/ Medical Decision Making/ A&P                           Medical Decision Making Amount and/or Complexity of Data Reviewed Labs: ordered.  Risk Prescription drug management.   Patient presents with urinary frequency.  Her urine is consistent with infection.  I reviewed her chart and her prior cultures were E. coli and were pansensitive.  I will start her on Keflex.  She also requested Diflucan given that she has common yeast infections after antibiotic usage.  She does not have any systemic symptoms or indications for inpatient hospitalization.  She was discharged home in good condition.  She was encouraged to follow-up with her primary care doctor if her symptoms are not improving.  Return precautions were given.   Final Clinical Impression(s) / ED Diagnoses Final diagnoses:  Lower urinary tract infectious disease    Rx / DC Orders ED Discharge Orders          Ordered    cephALEXin (KEFLEX) 500 MG capsule  2 times daily        07/31/22 2103    fluconazole (DIFLUCAN) 150 MG tablet   Once        07/31/22 2103              Malvin Johns, MD 07/31/22 2107

## 2022-08-03 LAB — URINE CULTURE: Culture: 80000 — AB

## 2022-08-04 ENCOUNTER — Telehealth: Payer: Self-pay | Admitting: *Deleted

## 2022-08-04 NOTE — Telephone Encounter (Signed)
Post ED Visit - Positive Culture Follow-up  Culture report reviewed by antimicrobial stewardship pharmacist: [facility] Team '[]'$  Elenor Quinones, Pharm.D. '[]'$  Heide Guile, Pharm.D., BCPS AQ-ID '[]'$  Par[place] Neptune, Pharm.D., BCPS '[]'$  Alycia Rossetti, Pharm.D., BCPS '[]'$  [place], Pharm.D., BCPS, AAHIVP '[]'$  Legrand Como, Pharm.D., BCPS, AAHIVP '[]'$  Salome Arnt, PharmD, BCPS '[]'$  Johnnette Gourd, PharmD, BCPS '[]'$  Hughes Better, PharmD, BCPS '[]'$  Leeroy Cha, PharmD '[]'$  Laqueta Linden, PharmD, BCPS '[]'$  Albertina Parr, PharmD  [facility] Team '[]'$  Leodis Sias, PharmD '[]'$  Lindell Spar, PharmD '[]'$  Royetta Asal, PharmD '[]'$  Graylin Shiver, Rph '[]'$  Rema Fendt) Glennon Mac, PharmD '[]'$  Arlyn Dunning, PharmD '[]'$  Netta Cedars, PharmD '[]'$  Dia Sitter, PharmD '[]'$  Leone Haven, PharmD '[]'$  Gretta Arab, PharmD '[]'$  Theodis Shove, PharmD '[]'$  Peggyann Juba, PharmD '[]'$  Reuel Boom, PharmD   Positive urine culture Treated with Ce[halexin, organism sensitive to the same and no further patient follow-up is required at this timeHale Fulton Mole, PharmD.  Harlon Flor [facility] 08/04/2022, 2:26 PM

## 2022-08-21 NOTE — Progress Notes (Signed)
    SUBJECTIVE:   CHIEF COMPLAINT / HPI:   Patient present for suboxone refill. She has been on suboxone for 5 years with good tolerance. She takes 8-2 Suboxone film strips twice daily. Endorses compliance and no side effect.  Missed period Patient report some hot flashes that's been going on for a year now Sometimes she gets drenched in sweat. She also reports happens multiple times a day. Endorses weight gain since she quit smoking Patient report history of thyroid diease in the family Grandma might have had early menopause She missed her period in August. Reported  LMP 06/22/2022  PERTINENT  PMH / PSH: Chronic migraine, opiate addiction, MDD  OBJECTIVE:   BP 117/81   Pulse 69   Wt 157 lb 12.8 oz (71.6 kg)   LMP 06/22/2022   SpO2 100%   BMI 24.71 kg/m    Physical Exam General: Alert, well appearing, NAD Cardiovascular: RRR, No Murmurs, Normal S2/S2 Respiratory: CTAB, No wheezing or Rales Abdomen: No distension or tenderness Extremities: No edema on extremities     ASSESSMENT/PLAN:   Suboxone refill Patient is on Suboxone therapy for over 5 years and reports good tolerance and denies any side effects. Her home pharmacy is currently out of stock for Suboxone.  [facility] [address] was found to have only 15-day supply in stock.  -Unable to review PDMP due to system error -Refilled 15 day supply of Suboxone, Patient to call in for refill before she runs out -Follow-up in a month  Amenorrhea  Patient with missed period last month (August) and reported LMP 06/22/2022.  Endorses intermittent hot flashes that occur about  about 3-4 times daily.  Unclear cause of secondary amenorrhea with hot flashes.  Differential diagnosis include pregnancy, primary ovarian insufficiency and thyroid disease.  Pregnancy test was negative.  Will obtain labs to assess for primary ovarian insufficiency and thyroid disease. -Follow-up lab for TSH -Follow-up lab for prolactin, estradiol,  Hannah, MD [place]

## 2022-08-22 ENCOUNTER — Ambulatory Visit (INDEPENDENT_AMBULATORY_CARE_PROVIDER_SITE_OTHER): Payer: Medicare Other | Admitting: Student

## 2022-08-22 ENCOUNTER — Encounter: Payer: Self-pay | Admitting: Student

## 2022-08-22 VITALS — BP 117/81 | HR 69 | Ht 67.0 in | Wt 157.8 lb

## 2022-08-22 DIAGNOSIS — F1990 Other psychoactive substance use, unspecified, uncomplicated: Secondary | ICD-10-CM

## 2022-08-22 DIAGNOSIS — N926 Irregular menstruation, unspecified: Secondary | ICD-10-CM | POA: Diagnosis not present

## 2022-08-22 LAB — POCT URINE PREGNANCY: Preg Test, Ur: NEGATIVE

## 2022-08-22 MED ORDER — BUPRENORPHINE HCL-NALOXONE HCL 8-2 MG SL FILM: 1.0000 | ORAL_FILM | Freq: Two times a day (BID) | SUBLINGUAL | 0 refills | Status: DC

## 2022-08-22 NOTE — Patient Instructions (Signed)
It was wonderful to see you today. Thank you for allowing me to be a part of your care. Below is a short summary of what we discussed at your visit today:  Sent in refills for your Suboxone for 15 days as this is all the pharmacy have in stock at this time.  For your missed period and hot flashes we collected lab for pregnancy , prolactin, FSH, estradiol and thyroid.  We will follow-up with you on your results.  Please bring all of your medications to every appointment!  If you have any questions or concerns, please do not hesitate to contact us via phone or MyChart message.   Alen Bleacher, MD [facility]

## 2022-08-23 ENCOUNTER — Other Ambulatory Visit: Payer: Medicare Other

## 2022-08-23 DIAGNOSIS — N926 Irregular menstruation, unspecified: Secondary | ICD-10-CM

## 2022-08-24 LAB — PROLACTIN: Prolactin: 10.2 ng/mL (ref 4.8–23.3)

## 2022-08-24 LAB — ESTRADIOL: Estradiol: <5 pg/mL

## 2022-08-24 LAB — FOLLICLE STIMULATING HORMONE: FSH: 96.7 m[IU]/mL

## 2022-08-24 LAB — TSH+FREE T4
Free T4: 1.44 ng/dL (ref 0.82–1.77)
TSH: 1.2 u[IU]/mL (ref 0.450–4.500)

## 2022-08-25 ENCOUNTER — Encounter: Payer: Self-pay | Admitting: Student

## 2022-08-25 DIAGNOSIS — F1990 Other psychoactive substance use, unspecified, uncomplicated: Secondary | ICD-10-CM

## 2022-08-28 MED ORDER — BUPRENORPHINE HCL-NALOXONE HCL 8-2 MG SL FILM: 1.0000 | ORAL_FILM | Freq: Two times a day (BID) | SUBLINGUAL | 0 refills | Status: AC

## 2022-09-09 ENCOUNTER — Telehealth: Payer: Self-pay | Admitting: Family Medicine

## 2022-09-09 NOTE — Telephone Encounter (Signed)
After hours call line  Patient calls regarding grandson who is 56 months old and she is concerned that he has not had a BM for the past 5 days and it has been an issue for the past 3 weeks. She is worried that he may not have developed appropriately, his mom has been giving him an enema. When asked, her grandson does not follow at our clinic at [facility] but follows at a clinic in [place]. I conveyed to her that since this patient does not follow with [place], I cannot give medical advice. She denies any abdominal pain and will leave it up to the mother's decision. I recommended to call the doctor on-call at his clinic. She expressed understanding.

## 2022-09-19 ENCOUNTER — Encounter: Payer: Self-pay | Admitting: Student

## 2022-09-19 ENCOUNTER — Ambulatory Visit (INDEPENDENT_AMBULATORY_CARE_PROVIDER_SITE_OTHER): Payer: Medicare Other | Admitting: Student

## 2022-09-19 VITALS — BP 100/62 | HR 91 | Ht 67.0 in | Wt 163.0 lb

## 2022-09-19 DIAGNOSIS — E2839 Other primary ovarian failure: Secondary | ICD-10-CM | POA: Diagnosis not present

## 2022-09-19 DIAGNOSIS — F1121 Opioid dependence, in remission: Secondary | ICD-10-CM | POA: Diagnosis not present

## 2022-09-19 LAB — POCT URINE DRUG SCREEN
POC Amphetamine UR: NOT DETECTED
POC BENZODIAZEPINES UR: NOT DETECTED
POC Barbiturate UR: NOT DETECTED
POC Buprenorphine (BUP): POSITIVE — AB
POC Cocaine UR: NOT DETECTED
POC Ecstasy UR: NOT DETECTED
POC Marijuana UR: NOT DETECTED
POC Methadone UR: NOT DETECTED
POC Methamphetamine UR: NOT DETECTED
POC Opiate Ur: NOT DETECTED
POC Oxycodone UR: NOT DETECTED
POC PH URINE: NORMAL
POC PHENCYCLIDINE UR: NOT DETECTED
POC SPECIFIC GRAVITY URINE: NORMAL
URINE TEMPERATURE: 92 Degrees F (ref 90.0–100.0)

## 2022-09-19 MED ORDER — BUPRENORPHINE HCL-NALOXONE HCL 8-2 MG SL FILM: 1.0000 | ORAL_FILM | Freq: Two times a day (BID) | SUBLINGUAL | 0 refills | Status: DC

## 2022-09-19 NOTE — Assessment & Plan Note (Signed)
Patient presented with initial complaint of ambulatory and hot flashes.  Labs showed normal TSH and prolactin level however estradiol was less than 6 and FSH was 96.7 which within menopausal range.  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  Discussed findings with patient who at this time reported she is definitely done having kids.  Disscuss signs and symptoms of premature menopause and future management which includes hormonal therapies and vitamin supplements to reduce risk of osteoporosis.  Patient is not interested in  treatment or management at this time.

## 2022-09-19 NOTE — Assessment & Plan Note (Addendum)
She is on Suboxone therapy for chronic opiate addiction.  Reports good tolerance to medication.  UDS was obtained today was only positive for buprenorphine. -Refilled Suboxone.

## 2022-09-19 NOTE — Patient Instructions (Signed)
It was wonderful to see you today. Thank you for allowing me to be a part of your care. Below is a short summary of what we discussed at your visit today:  Urine drug test today was negative.  Refilled your Suboxone  Labs done a month ago shows that you may be experiencing primary ovarian insufficiency (premature menopause)  Please bring all of your medications to every appointment!  If you have any questions or concerns, please do not hesitate to contact us via phone or MyChart message.   Alen Bleacher, MD   [facility] Primary Ovarian Insufficiency  Primary ovarian insufficiency is a condition in which the ovaries stop working in women younger than age 55. The ovaries have a fixed number of eggs, which are stored in fluid-filled sacs (follicles). The ovaries also make the female sex hormones, including estrogen. After puberty, female hormones trigger the release of an egg from a follicle (ovulation) each month. If the egg does not get fertilized by a sperm, a woman will have a menstrual period. Throughout life, the number of follicles in the ovaries slowly declines. A condition called menopause occurs when there are no follicles left. When this occurs, ovulation stops and the level of estrogen drops. This is a natural process and occurs in all women by about age 5. If you have primary ovarian insufficiency, the loss of follicles and the inability to make estrogen occurs at a much younger age. What are the causes? In most cases, the exact cause of this condition is not known. Some known causes include: Not having enough follicles. You are born with a certain number of follicles. You may not have enough to last 40 or more years. Cancer treatments that damage follicles. These include medicines (chemotherapy), high energy waves (radiation), or surgery. Follicles that do not respond to the brain hormone that tells follicles to release eggs (follicle-stimulating hormone, or  FSH). Having a genetic disorder that causes abnormal ovaries (Turner syndrome or fragile X syndrome). Having a condition in which your immune system attacks your ovaries (autoimmune disease). What increases the risk? You are more likely to develop this condition if: You smoke. You have diabetes. You have a family history of primary ovarian insufficiency. What are the symptoms? Symptoms of this condition include: Not being able to get pregnant. Irregular or missed menstrual periods. Other symptoms can be caused by low levels of estrogen. They include: Night sweats and hot flashes. Vaginal dryness, which can cause pain during sex. Loss of interest in sex (low libido). Irritability. Trouble sleeping. Confusion. Thinning or weak bones (osteoporosis), which may cause bones to break easily. In many cases, there are no symptoms for this condition. How is this diagnosed? This condition may be diagnosed based on: Your symptoms. Your health care provider will ask you questions about your symptoms. He or she may suspect primary ovarian insufficiency if you have trouble getting pregnant or if you have irregular or missed menstrual periods. A physical exam. Blood tests. This is done to confirm the diagnosis. The tests will check for: Low levels of estrogen. High levels of FSH. Follicle loss and low levels of estrogen will cause your brain to release more FSH. Low levels of anti-Mullerian hormone (AMH). As follicles are lost, this hormone also declines. If you have low estrogen associated with low AMH and high FSH, your health care provider may do more tests to look for a possible cause of your ovarian insufficiency. These may include genetic testing and testing  for autoimmune disease. How is this treated? This condition is treated with hormone replacement therapy, which replaces the female hormones estrogen and progesterone. These hormones may be given as oral medicines or as a skin patch. This  treatment may: Relieve the symptoms of low estrogen. Prevent osteoporosis. Protect from heart disease. Treatment often goes on until around age 27 when menopause usually occurs. There is no treatment that can fully restore fertility. However, a small number of women can get pregnant after being diagnosed with this condition. If you desire to get pregnant now or in the future, talk to a fertility specialist right away about your options. Follow these instructions at home:  Take over-the-counter and prescription medicines only as told by your health care provider. Ask your health care provider about the risks and side effects of hormone replacement therapy. Eat foods rich in calcium and vitamin D. These include milk or other dairy products, and orange juice or breakfast cereals that have vitamin D added (are fortified). These may help to prevent osteoporosis. Exercise regularly. This can make your muscles and bones stronger. Ask your health care provider which activities are safe for you. Do not use any products that contain nicotine or tobacco. These products include cigarettes, chewing tobacco, and vaping devices, such as e-cigarettes. If you need help quitting, ask your health care provider. Keep all follow-up visits. This is important. Where to find more information American Pregnancy Association: americanpregnancy.org [employer] Network: www.daisynetwork.org Contact a health care provider if: You have symptoms of low estrogen. Your menstrual periods stop or are not regular. You have side effects from your medicines. Summary Primary ovarian insufficiency results when your ovaries stop working normally before age 53. In most cases, the exact cause of this condition is not known. The first sign of this disorder may be difficulty getting pregnant. Symptoms of this condition include irregular or missed menstrual periods and symptoms of low estrogen. This condition is treated with hormone  replacement therapy. This reduces symptoms of low estrogen and protects from osteoporosis. This information is not intended to replace advice given to you by your health care provider. Make sure you discuss any questions you have with your health care provider. Document Revised: 04/05/2021 Document Reviewed: 04/05/2021 Elsevier Patient Education  [place].

## 2022-09-19 NOTE — Progress Notes (Signed)
    SUBJECTIVE:   CHIEF COMPLAINT / HPI:   Patient is a 36 year old female here today for refill of her Suboxone.  She reports good tolerance and denies any side effects.  She has been on this medication for over 5 years due to history of opiate dependence.  Premature ovarian insufficiency Patient reports LMP is 06/22/2022 He is not on any contraceptives.  And last pregnancy test a month ago was negative. Labs were obtained at last visit due to concerns for premature ovarian insufficiency given symptoms of hot flashes at the time.  PERTINENT  PMH / PSH: Noncontributory  OBJECTIVE:   BP 100/62   Pulse 91   Ht '5\' 7"'$  (1.702 m)   Wt 163 lb (73.9 kg)   LMP 06/22/2022   SpO2 98%   BMI 25.53 kg/m    Physical Exam General: Alert, well appearing, NAD Cardiovascular: Regular rate, well-perfused Respiratory: Normal work of breathing on room air Extremities: No edema on extremities     ASSESSMENT/PLAN:   Opiate addiction ([place]) She is on Suboxone therapy for chronic opiate addiction.  Reports good tolerance to medication.  UDS was obtained today was only positive for buprenorphine. -Refilled Suboxone.  Premature ovarian insufficiency Patient presented with initial complaint of ambulatory and hot flashes.  Labs showed normal TSH and prolactin level however estradiol was less than 6 and FSH was 96.7 which within menopausal range.  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  Discussed findings with patient who at this time reported she is definitely done having kids.  Disscuss signs and symptoms of premature menopause and future management which includes hormonal therapies and vitamin supplements to reduce risk of osteoporosis.  Patient is not interested in  treatment or management at this time.     Alen Bleacher, MD [place]

## 2022-10-04 ENCOUNTER — Telehealth: Payer: Self-pay | Admitting: Family Medicine

## 2022-10-04 ENCOUNTER — Encounter: Payer: Self-pay | Admitting: Student

## 2022-10-04 NOTE — Telephone Encounter (Signed)
**  After Hours/ Emergency Line Call**  Received a call to report that Rhonda Cabrera reporting extreme menstrual cycle cramping. She has been working with Dr. Adah Salvage as she has not had a menstrual cycle in 4 months and was diagnosed as premature ovarian insufficiency. Her menstrual cycles before this were extremely painful and she was diagnosed with endometriosis. Patient reports she was previously getting prescribed methocarbamol to help with the pain. She has taken several OTC medications and tried her heating pad tonight without any relief. Patient is having significant bleeding as well and would like to be seen in clinic.   Upon chart review, medication is in the patient's medication list, so will send in a short supply and have scheduled the patient for an appointment tomorrow. Red flags discussed.  Will forward to PCP.  Rise Patience, DO  PGY-3, [facility] 10/04/2022 11:57 PM

## 2022-10-04 NOTE — Telephone Encounter (Incomplete)
**  After Hours/ Emergency Line Call**  Received a call to report that Rhonda Cabrera ***her cycle had stopped back in June and was told that she had early menopause. Patient starting having extreme cramping sensation.  Endorsing ***.  Denying ***.  Recommended that ***.  Red flags discussed.  Will forward to PCP.  Rise Patience, DO  PGY-***, [facility] 10/04/2022 11:57 PM

## 2022-10-05 ENCOUNTER — Ambulatory Visit: Payer: Self-pay

## 2022-10-05 MED ORDER — METHOCARBAMOL 500 MG PO TABS: 500.0000 mg | ORAL_TABLET | Freq: Two times a day (BID) | ORAL | 0 refills | Status: DC

## 2022-10-05 NOTE — Progress Notes (Deleted)
    SUBJECTIVE:   CHIEF COMPLAINT / HPI:   Rhonda Cabrera is a 36 y.o. female with a history of laparoscopically dx endometriosis who presents to the [facility] clinic today to discuss the following concerns:   Menstrual Pain Patient called the after hours emergency line overnight due to her significant pain. She was given a prescription for Robaxin which has helped her in the past. She has been managing her pain with Tylenol/Motrin/heating pads. Patient has had multiple visits over the years for similar concerns.  She has been offered OCPs in the past (POPs given hx of smoking and increased risk for coagulopathies). She has not taken them despite several previous prescriptions given her fear of blood clots. Previously followed by OBGYN for this concern. Last OBGYN note from 05/2021 notes that she was referred to [facility] chronic pelvic pain clinic.   PERTINENT  PMH / PSH: Endometriosis, BTL   OBJECTIVE:   There were no vitals taken for this visit. ***  General: NAD, pleasant, able to participate in exam Cardiac: RRR, no murmurs. Respiratory: CTAB, normal effort, No wheezes, rales or rhonchi Abdomen: Bowel sounds present, nontender, nondistended, no hepatosplenomegaly. Extremities: no edema or cyanosis. Skin: warm and dry, no rashes noted Neuro: alert, no obvious focal deficits Psych: Normal affect and mood  ASSESSMENT/PLAN:   No problem-specific Assessment & Plan notes found for this encounter.     Sharion Settler, [place]

## 2022-10-06 ENCOUNTER — Ambulatory Visit (INDEPENDENT_AMBULATORY_CARE_PROVIDER_SITE_OTHER): Payer: Medicare Other | Admitting: Student

## 2022-10-06 VITALS — BP 104/77 | HR 70 | Ht 67.0 in | Wt 159.1 lb

## 2022-10-06 DIAGNOSIS — E2839 Other primary ovarian failure: Secondary | ICD-10-CM | POA: Diagnosis not present

## 2022-10-06 DIAGNOSIS — N939 Abnormal uterine and vaginal bleeding, unspecified: Secondary | ICD-10-CM | POA: Diagnosis not present

## 2022-10-06 DIAGNOSIS — N946 Dysmenorrhea, unspecified: Secondary | ICD-10-CM | POA: Diagnosis not present

## 2022-10-06 LAB — POCT URINE PREGNANCY: Preg Test, Ur: NEGATIVE

## 2022-10-06 MED ORDER — METHOCARBAMOL 500 MG PO TABS
500.0000 mg | ORAL_TABLET | Freq: Two times a day (BID) | ORAL | 0 refills | Status: DC
Start: 2022-10-06 — End: 2022-11-30

## 2022-10-06 MED ORDER — MEDROXYPROGESTERONE ACETATE 10 MG PO TABS: 10.0000 mg | ORAL_TABLET | Freq: Every day | ORAL | 0 refills | Status: DC

## 2022-10-06 NOTE — Patient Instructions (Addendum)
It was great seeing you today.  We collected some labs today. Please get your pelvic ultrasound done. I will call you if any of this is abnormal.  Take Tylenol 500 mg with ibuprofen 400-600 mg every 6 hours scheduled. You may also take Robaxin twice daily as needed.   If you have any questions or concerns, please feel free to call the clinic.    Be well,  Dr. Orvis Brill [facility] [phone]

## 2022-10-06 NOTE — Progress Notes (Signed)
    SUBJECTIVE:   CHIEF COMPLAINT / HPI:   Rhonda Cabrera 36 year old female with a history of premature ovarian insufficiency here with 2 days of heavy vaginal bleeding.  She has been going through 5-6 super maxi pads per day.  Also passing clots bigger than a quarter in size.  having severe pain as well.  She called after hours line and was prescribed Robaxin , which she has been taking in addition to Tylenol  and ibuprofen . Prior to this episode of bleeding, she had not had a period for about 4 to 5 months, and at that time she was diagnosed with ovarian insufficiency.  Has been having hot flashes. 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  She says something is not right and that this is not normal. PERTINENT  PMH / PSH: Reviewed  OBJECTIVE:   BP 104/77   Pulse 70   Ht 5' 7 (1.702 m)   Wt 159 lb 2 oz (72.2 kg)   LMP 10/04/2022   SpO2 98%   BMI 24.92 kg/m   General: Well-appearing, no acute distress Respiratory: Normal work of breathing on room air GU: Chaperone Rhonda Cabrera, CMA present.  Normal external genitalia.  Normal rugated vaginal walls.  Moderate amount of blood pooling in posterior fornix with small blood clots.  No cervical polyps.  Cervical os closed. No palpable masses with bimanual exam, but fullness felt on left-side.   ASSESSMENT/PLAN:   Vaginal bleeding 36 year-old female w/ 2 days of heavy vaginal bleeding and pain. No abnormalities seen on exam, but left uterus with fullness on bimanual exam. Consider fibroids, dysregulation with menses in setting of premature ovarian insufficiency. No concern for cervical polyp or laceration. Pain could be due to endometriosis. Possibly adenomyosis. Obtain complete pelvic US  w/ transvaginal for further work-up.  Prescribed robaxin  and encouraged NSAID use for pain (has hx opioid dependence) Also prescribed Provera  10 mg for 10 days. CBC wnl today- no major signs of anemia. Return precautions  discussed at length. Will f/u US .     Modena Bellemare, DO [facility]

## 2022-10-07 LAB — CBC
Hematocrit: 36 % (ref 34.0–46.6)
Hemoglobin: 11.8 g/dL (ref 11.1–15.9)
MCH: 28.2 pg (ref 26.6–33.0)
MCHC: 32.8 g/dL (ref 31.5–35.7)
MCV: 86 fL (ref 79–97)
Platelets: 315 10*3/uL (ref 150–450)
RBC: 4.18 x10E6/uL (ref 3.77–5.28)
RDW: 13.3 % (ref 11.7–15.4)
WBC: 4.8 10*3/uL (ref 3.4–10.8)

## 2022-10-08 DIAGNOSIS — N939 Abnormal uterine and vaginal bleeding, unspecified: Secondary | ICD-10-CM | POA: Insufficient documentation

## 2022-10-08 NOTE — Assessment & Plan Note (Addendum)
36 year-old female w/ 2 days of heavy vaginal bleeding and pain. No abnormalities seen on exam, but left uterus with fullness on bimanual exam. Consider fibroids, dysregulation with menses in setting of premature ovarian insufficiency. No concern for cervical polyp or laceration. Pain could be due to endometriosis. Possibly adenomyosis. Obtain complete pelvic US w/ transvaginal for further work-up.  Prescribed robaxin and encouraged NSAID use for pain (has hx opioid dependence) Also prescribed Provera 10 mg for 10 days. CBC wnl today- no major signs of anemia. Return precautions discussed at length. Will f/u US.

## 2022-10-11 ENCOUNTER — Encounter (HOSPITAL_BASED_OUTPATIENT_CLINIC_OR_DEPARTMENT_OTHER): Payer: Self-pay | Admitting: Emergency Medicine

## 2022-10-11 ENCOUNTER — Emergency Department (HOSPITAL_BASED_OUTPATIENT_CLINIC_OR_DEPARTMENT_OTHER)
Admission: EM | Admit: 2022-10-11 | Discharge: 2022-10-12 | Disposition: A | Discharge status: Home / Self Care | Payer: Medicare Other | Attending: Emergency Medicine | Admitting: Emergency Medicine

## 2022-10-11 DIAGNOSIS — N3000 Acute cystitis without hematuria: Secondary | ICD-10-CM | POA: Insufficient documentation

## 2022-10-11 DIAGNOSIS — R300 Dysuria: Secondary | ICD-10-CM | POA: Diagnosis present

## 2022-10-11 LAB — URINALYSIS, MICROSCOPIC (REFLEX): WBC, UA: >50 WBC/hpf (ref 0–5)

## 2022-10-11 LAB — URINALYSIS, ROUTINE W REFLEX MICROSCOPIC
Glucose, UA: 250 mg/dL — AB
Hgb urine dipstick: NEGATIVE
Ketones, ur: 15 mg/dL — AB
Nitrite: POSITIVE — AB
Protein, ur: >=300 mg/dL — AB
Specific Gravity, Urine: 1.01 (ref 1.005–1.030)
pH: 5 (ref 5.0–8.0)

## 2022-10-11 LAB — PREGNANCY, URINE: Preg Test, Ur: NEGATIVE

## 2022-10-11 MED ORDER — CEPHALEXIN 500 MG PO CAPS: 500.0000 mg | ORAL_CAPSULE | Freq: Two times a day (BID) | ORAL | 0 refills | Status: AC

## 2022-10-11 MED ORDER — FLUCONAZOLE 150 MG PO TABS: 150.0000 mg | ORAL_TABLET | Freq: Every day | ORAL | 0 refills | Status: DC

## 2022-10-11 MED ORDER — CEPHALEXIN 250 MG PO CAPS
500.0000 mg | ORAL_CAPSULE | Freq: Once | ORAL | Status: AC
  Administered 2022-10-12: 500 mg via ORAL
  Filled 2022-10-11: qty 2

## 2022-10-11 NOTE — ED Triage Notes (Signed)
Pt states she has a UTI  Pt is c/o frequency, urgency and dysuria   Sxs started today  Pt states she has hx of recent UTI

## 2022-10-12 ENCOUNTER — Ambulatory Visit (HOSPITAL_COMMUNITY): Payer: Medicare Other

## 2022-10-12 DIAGNOSIS — N3000 Acute cystitis without hematuria: Secondary | ICD-10-CM | POA: Diagnosis not present

## 2022-10-12 NOTE — ED Provider Notes (Signed)
[facility] EMERGENCY DEPARTMENT Provider Note   CSN: [id number] Arrival date & time: 10/11/22  2244     History  Chief Complaint  Patient presents with   Dysuria    Rhonda Cabrera is a 36 y.o. female.  36 y/o female presenting for dysuria with urinary frequency and urgency which began this morning. Symptoms have been constant, worsening. No medications taken PTA for symptoms. She denies fever, vomiting, hematuria. Endorses a hx of UTIs and states symptoms feel similar; last tx with abx in August. No urologic follow up.  The history is provided by the patient. No language interpreter was used.  Dysuria      Home Medications Prior to Admission medications   Medication Sig Start Date End Date Taking? Authorizing Provider  cephALEXin (KEFLEX) 500 MG capsule Take 1 capsule (500 mg total) by mouth 2 (two) times daily for 7 days. 10/11/22 10/18/22 Yes Antonietta Breach, PA-C  fluconazole (DIFLUCAN) 150 MG tablet Take 1 tablet (150 mg total) by mouth daily. Take if yeast infection upon completion of antibiotics. 10/11/22  Yes Antonietta Breach, PA-C  Biotin 1 MG CAPS Take by mouth.    [provider]  Buprenorphine HCl-Naloxone HCl 8-2 MG FILM Place 1 Film under the tongue in the morning and at bedtime. 09/19/22 10/19/22  Alen Bleacher, MD  gabapentin (NEURONTIN) 100 MG capsule Take 1 capsule (100 mg total) by mouth 3 (three) times daily. 03/24/21   Matilde Haymaker, MD  hydrocortisone (ANUSOL-HC) 2.5 % rectal cream Place 1 application rectally 2 (two) times daily. 10/25/19   Daisy Floro, DO  medroxyPROGESTERone (PROVERA) 10 MG tablet Take 1 tablet (10 mg total) by mouth daily. 10/06/22   Dameron, Luna Fuse, DO  methocarbamol (ROBAXIN) 500 MG tablet Take 1 tablet (500 mg total) by mouth 2 (two) times daily. 10/06/22   Dameron, Luna Fuse, DO  Multiple Vitamin (MULTIVITAMIN) capsule Take 1 capsule by mouth daily.    [provider]  polyethylene glycol powder  (GLYCOLAX/MIRALAX) 17 GM/SCOOP powder Take 17 g by mouth 2 (two) times daily as needed. 07/01/19   Lind Covert, MD      Allergies    Seroquel xr [quetiapine fumarate er]    Review of Systems   Review of Systems  Genitourinary:  Positive for dysuria.  Ten systems reviewed and are negative for acute change, except as noted in the HPI.    Physical Exam Updated Vital Signs BP 117/76 (BP Location: Left Arm)   Pulse (!) 59   Temp 98.3 F (36.8 C) (Oral)   Resp 20   Ht 5' 7.5" (1.715 m)   Wt 71.7 kg   LMP 10/04/2022   SpO2 100%   BMI 24.38 kg/m   Physical Exam Vitals and nursing note reviewed.  Constitutional:      General: She is not in acute distress.    Appearance: She is well-developed. She is not diaphoretic.  HENT:     Head: Normocephalic and atraumatic.  Eyes:     General: No scleral icterus.    Conjunctiva/sclera: Conjunctivae normal.  Pulmonary:     Effort: Pulmonary effort is normal. No respiratory distress.  Abdominal:     General: There is no distension.  Musculoskeletal:        General: Normal range of motion.     Cervical back: Normal range of motion.  Skin:    General: Skin is warm and dry.     Coloration: Skin is not pale.     Findings:  No erythema or rash.  Neurological:     Mental Status: She is alert and oriented to person, place, and time.  Psychiatric:        Behavior: Behavior normal.     ED Results / Procedures / Treatments   Labs (all labs ordered are listed, but only abnormal results are displayed) Labs Reviewed  URINALYSIS, ROUTINE W REFLEX MICROSCOPIC - Abnormal; Notable for the following components:      Result Value   Color, Urine ORANGE (*)    Glucose, UA 250 (*)    Bilirubin Urine MODERATE (*)    Ketones, ur 15 (*)    Protein, ur >=300 (*)    Nitrite POSITIVE (*)    Leukocytes,Ua LARGE (*)    All other components within normal limits  URINALYSIS, MICROSCOPIC (REFLEX) - Abnormal; Notable for the following components:    Bacteria, UA FEW (*)    All other components within normal limits  URINE CULTURE  PREGNANCY, URINE    EKG None  Radiology No results found.  Procedures Procedures    Medications Ordered in ED Medications  cephALEXin (KEFLEX) capsule 500 mg (500 mg Oral Given 10/12/22 0016)    ED Course/ Medical Decision Making/ A&P                           Medical Decision Making Amount and/or Complexity of Data Reviewed Labs: ordered.  Risk Prescription drug management.   This patient presents to the ED for concern of dysuria, this involves an extensive number of treatment options, and is a complaint that carries with it a high risk of complications and morbidity.  The differential diagnosis includes acute bacterial cystitis vs STI vs interstitial cystitis   Co morbidities that complicate the patient evaluation  None    Additional history obtained:  External records from outside source obtained and reviewed including prior urine culture results from 07/2022   Lab Tests:  I Ordered, and personally interpreted labs.  The pertinent results include:  UA c/w UTI with bacteria, large leukocytes, nitrite positive   Medicines ordered and prescription drug management:  I ordered medication including Keflex for UTI treatment initiation  Reevaluation of the patient after these medicines showed that the patient stayed the same I have reviewed the patients home medicines and have made adjustments as needed   Test Considered:  GC/Chlamydia, urine   Reevaluation:  After the interventions noted above, I reevaluated the patient and found that they have : remained stable   Social Determinants of Health:  Insured patient   Dispostion:  After consideration of the diagnostic results and the patients response to treatment, I feel that the patent would benefit from outpatient course of Keflex. Referral given to Urology in light of hx of frequent UTIs. Return precautions  discussed and provided. Patient discharged in stable condition with no unaddressed concerns.          Final Clinical Impression(s) / ED Diagnoses Final diagnoses:  Acute cystitis without hematuria    Rx / DC Orders ED Discharge Orders          Ordered    cephALEXin (KEFLEX) 500 MG capsule  2 times daily        10/11/22 2359    fluconazole (DIFLUCAN) 150 MG tablet  Daily        10/11/22 2359              Antonietta Breach, PA-C 10/12/22 1535    Mesner,  Corene Cornea, MD 10/12/22 [phone]

## 2022-10-13 LAB — URINE CULTURE: Culture: <10000 — AB

## 2022-10-16 ENCOUNTER — Ambulatory Visit (HOSPITAL_BASED_OUTPATIENT_CLINIC_OR_DEPARTMENT_OTHER)
Admission: RE | Admit: 2022-10-16 | Discharge: 2022-10-16 | Disposition: A | Discharge status: Home / Self Care | Payer: Medicare Other | Source: Ambulatory Visit | Attending: Family Medicine | Admitting: Family Medicine

## 2022-10-16 DIAGNOSIS — N946 Dysmenorrhea, unspecified: Secondary | ICD-10-CM | POA: Insufficient documentation

## 2022-10-17 ENCOUNTER — Encounter: Payer: Self-pay | Admitting: Student

## 2022-10-17 ENCOUNTER — Ambulatory Visit (INDEPENDENT_AMBULATORY_CARE_PROVIDER_SITE_OTHER): Payer: Medicare Other | Admitting: Student

## 2022-10-17 DIAGNOSIS — F1121 Opioid dependence, in remission: Secondary | ICD-10-CM | POA: Diagnosis not present

## 2022-10-17 MED ORDER — BUPRENORPHINE HCL-NALOXONE HCL 8-2 MG SL FILM: 1.0000 | ORAL_FILM | Freq: Two times a day (BID) | SUBLINGUAL | 0 refills | Status: DC

## 2022-10-17 NOTE — Patient Instructions (Addendum)
It was wonderful to see you today. Thank you for allowing me to be a part of your care. Below is a short summary of what we discussed at your visit today:  Refilled your Suboxone.  Here is the number to [facility] [phone] (From ED referral).  Please call and follow-up with them.  Please bring all of your medications to every appointment!  If you have any questions or concerns, please do not hesitate to contact us via phone or MyChart message.   Alen Bleacher, MD [facility]

## 2022-10-17 NOTE — Progress Notes (Signed)
    SUBJECTIVE:   CHIEF COMPLAINT / HPI:   Patient is a 36 year old female presenting today for medication refill.  She is on long-term Suboxone treatment. She is compliant with her medication and endorses tolerance.  PERTINENT  PMH / PSH: Reviewed  OBJECTIVE:   BP 110/62   Pulse 71   Wt 163 lb 12.8 oz (74.3 kg)   LMP 10/04/2022   SpO2 98%   BMI 25.28 kg/m    Physical Exam General: Alert, well appearing, NAD Cardiovascular: Regular rate, well-perfused Respiratory: Normal work of breathing on room air    ASSESSMENT/PLAN:   Opiate addiction ([place]) Patient on long-term Suboxone therapy.  She endorses compliance and good tolerance.  -Refilled her Suboxone -Reviewed return precautions with patient.     Alen Bleacher, MD [place]

## 2022-10-17 NOTE — Assessment & Plan Note (Addendum)
Patient on long-term Suboxone therapy.  She endorses compliance and good tolerance.  -Refilled her Suboxone -Reviewed return precautions with patient.

## 2022-10-18 ENCOUNTER — Telehealth: Payer: Self-pay | Admitting: Student

## 2022-10-18 ENCOUNTER — Encounter: Payer: Self-pay | Admitting: Student

## 2022-10-18 DIAGNOSIS — F1121 Opioid dependence, in remission: Secondary | ICD-10-CM

## 2022-10-18 MED ORDER — BUPRENORPHINE HCL-NALOXONE HCL 8-2 MG SL FILM: 1.0000 | ORAL_FILM | Freq: Two times a day (BID) | SUBLINGUAL | 0 refills | Status: DC

## 2022-10-18 NOTE — Telephone Encounter (Signed)
Patient was told to call back once she found a pharmacy that had her medication. Patient called back with information. It is for patients Suboxone.   Pharmacy is:  [facility] [address] [place].   Thanks!

## 2022-10-18 NOTE — Telephone Encounter (Signed)
PDMP reviewed. Rx to [facility] in [place].  Dorris Singh, MD  Family Medicine Teaching Service

## 2022-10-18 NOTE — Telephone Encounter (Signed)
[facility] and spoke with Dance movement psychotherapist. Pharmacy is closed today. They are hoping that they will be able to open again tomorrow.   Due to closure, I am unable to cancel the prescription.   Please advise next steps.   Talbot Grumbling, RN

## 2022-11-13 ENCOUNTER — Ambulatory Visit (INDEPENDENT_AMBULATORY_CARE_PROVIDER_SITE_OTHER): Payer: Medicare Other | Admitting: Student

## 2022-11-13 ENCOUNTER — Encounter: Payer: Self-pay | Admitting: Student

## 2022-11-13 VITALS — BP 106/60 | HR 64 | Wt 164.2 lb

## 2022-11-13 DIAGNOSIS — F1121 Opioid dependence, in remission: Secondary | ICD-10-CM

## 2022-11-13 MED ORDER — BUPRENORPHINE HCL-NALOXONE HCL 8-2 MG SL FILM: 1.0000 | ORAL_FILM | Freq: Two times a day (BID) | SUBLINGUAL | 0 refills | Status: AC

## 2022-11-13 NOTE — Progress Notes (Signed)
    SUBJECTIVE:   CHIEF COMPLAINT / HPI:   Patient is a 36 year old female presenting today for refill of her Suboxone.  She endorses good tolerance and compliance to her medication.  Denies any side effects.   PERTINENT  PMH / PSH: Reviewed   OBJECTIVE:   BP 106/60   Pulse 64   Wt 164 lb 3.2 oz (74.5 kg)   SpO2 98%   BMI 25.34 kg/m    Physical Exam General: Alert, well appearing, NAD Cardiovascular: RRR, well-perfused Respiratory: Normal work of breathing on room air Abdomen: No distension or tenderness  ASSESSMENT/PLAN:   Opiate addiction ([place]) Patient on long-term Suboxone.  Denies any drug use and endorses compliance to her medication without side effects. -Refilled her Suboxone.     Alen Bleacher, MD [place]

## 2022-11-13 NOTE — Assessment & Plan Note (Signed)
Patient on long-term Suboxone.  Denies any drug use and endorses compliance to her medication without side effects. -Refilled her Suboxone.

## 2022-11-13 NOTE — Patient Instructions (Addendum)
It was wonderful to see you today. Thank you for allowing me to be a part of your care. Below is a short summary of what we discussed at your visit today:  Your medication has been reviewed. Please reach out if you develop any side effects.  Your pelvic ultrasound was normal.  If you have any questions or concerns, please do not hesitate to contact us via phone or MyChart message.   Alen Bleacher, MD [facility]

## 2022-11-16 ENCOUNTER — Other Ambulatory Visit (HOSPITAL_COMMUNITY): Payer: Self-pay

## 2022-11-17 ENCOUNTER — Telehealth: Payer: Self-pay

## 2022-11-17 ENCOUNTER — Other Ambulatory Visit (HOSPITAL_COMMUNITY): Payer: Self-pay

## 2022-11-17 NOTE — Telephone Encounter (Signed)
A Prior Authorization was initiated for this patients SUBOXONE through CoverMyMeds.   Key: [id number]

## 2022-11-22 NOTE — Telephone Encounter (Signed)
Prior Auth for patients medication SUBOXONE cancelled by [facility] via CoverMyMeds.   Reason: MEDICATION IS ON YOUR PLANS LIST OF COVERED MEDS  CoverMyMeds Key: [id number]

## 2022-11-27 ENCOUNTER — Encounter: Payer: Self-pay | Admitting: Student

## 2022-11-30 ENCOUNTER — Other Ambulatory Visit: Payer: Self-pay | Admitting: Student

## 2022-12-01 MED ORDER — METHOCARBAMOL 500 MG PO TABS: 500.0000 mg | ORAL_TABLET | Freq: Two times a day (BID) | ORAL | 0 refills | Status: DC

## 2022-12-15 ENCOUNTER — Other Ambulatory Visit: Payer: Self-pay | Admitting: Student

## 2022-12-15 ENCOUNTER — Encounter: Payer: Self-pay | Admitting: Student

## 2022-12-15 ENCOUNTER — Ambulatory Visit (INDEPENDENT_AMBULATORY_CARE_PROVIDER_SITE_OTHER): Payer: Medicare Other | Admitting: Student

## 2022-12-15 VITALS — BP 104/68 | HR 63 | Ht 66.5 in | Wt 164.0 lb

## 2022-12-15 DIAGNOSIS — F1121 Opioid dependence, in remission: Secondary | ICD-10-CM

## 2022-12-15 MED ORDER — BUPRENORPHINE HCL-NALOXONE HCL 8-2 MG SL FILM: 1.0000 | ORAL_FILM | Freq: Two times a day (BID) | SUBLINGUAL | 0 refills | Status: AC

## 2022-12-15 NOTE — Assessment & Plan Note (Signed)
-  Unable to Review PDMP due to system error -Refilled Suboxone

## 2022-12-15 NOTE — Telephone Encounter (Signed)
I called patient to discuss.   She reports the generic makes her sick and she needs the name brand called in.  The pharmacy reports they received the generic. They need [place] to call in the prescription for the name brand Suboxone.  Patient is very upset that she went to the pharmacy and the generic was called in.   I explained to patient we have had multiple issues with Suboxone, she realizes it is not our fault.   Please call the pharmacy and make sure name brand is called in.  She plans to go back tomorrow.   Will forward to PCP.

## 2022-12-15 NOTE — Progress Notes (Signed)
    SUBJECTIVE:   CHIEF COMPLAINT / HPI:   Patient is a 36 year old female with prior history of opioid use disorder now on suboxone for over 5 years.  She presents today for Suboxone refill.  She endorses compliance and tolerance to medication.  Denies any side effects.  Breast Mass Patient reports non-tender mass on the left breast at 2 o'clock Previous DG Mammogram negative for malignancy with radiologist recommending follow up with [facility] for further evaluation of mass. Patient feel mass is increased in size and wants second opinion with [facility]. Denies any nipple discharge   PERTINENT  PMH / PSH: Reviewed  OBJECTIVE:   BP 104/68   Pulse 63   Ht 5' 6.5" (1.689 m)   Wt 164 lb (74.4 kg)   LMP 11/30/2022 (Approximate)   SpO2 99%   BMI 26.07 kg/m     Physical Exam General: Alert, well appearing, NAD Cardiovascular: Regular rate, well-perfuse Respiratory: Normal work of breathing on room air Abdomen: No distension or tenderness   ASSESSMENT/PLAN:   Opiate addiction ([place]) -Unable to Review PDMP due to system error -Refilled Suboxone  Left Breast Mass Per Chart Review had surgical consultation arranged with Dr. Stark Klein at [facility]. Provided patient with their contact information     Alen Bleacher, MD [place]

## 2022-12-15 NOTE — Patient Instructions (Addendum)
For the breast Lump  Here is the number for [facility] surgery for further evaluation as recommended by the radiologist who performed your mammogram: [phone].   Buprenorphine; Naloxone Dissolving Film What is this medication? BUPRENORPHINE; NALOXONE (byoo pre NOR feen; nal OX one) treats opioid use disorder. Buprenorphine works by reducing withdrawal symptoms and cravings to use opioids. Naloxone supports proper use of this medication. It is most effective when used in combination with counseling and behavior therapy. This medicine may be used for other purposes; ask your health care provider or pharmacist if you have questions. COMMON BRAND NAME(S): BUNAVAIL, Suboxone What should I tell my care team before I take this medication? They need to know if you have any of these conditions: Brain tumor Dental or gum disease Frequently drink alcohol Gallbladder disease Head injury Heart disease Irregular heartbeat or rhythm Liver disease Low adrenal gland function Lung or breathing disease, such as asthma or COPD Mouth sores Pancreatic disease Seizures Sleep apnea Stomach or intestine problems Substance use disorder Taken an MAOI, such as Marplan, Nardil, or Parnate in the last 14 days An unusual or allergic reaction to buprenorphine, naloxone, other medications, foods, dyes, or preservatives Pregnant or trying to get pregnant Breastfeeding How should I use this medication? Take this medication by mouth. Take it as directed on the prescription label at the same time every day. Drink a sip of water to moisten the mouth. Be sure to use the right dose. Place it under the tongue or against the inside of the cheek (follow the instructions for use for the product you are taking). After you put this medication in your mouth as directed, do not move it. If your dose requires you to take more than 2 films, let the first 2 films dissolve, then use the others. Do not cut, chew, or swallow  the film. Keep taking it unless your care team tells you to stop. A special MedGuide will be given to you by the pharmacist with each prescription and refill. Be sure to read this information carefully each time. This medication comes with INSTRUCTIONS FOR USE. Ask your pharmacist for directions on how to use this medicine. Read the information carefully. Talk to your pharmacist or care team if you have questions. Talk to your care team about the use of this medication in children. Special care may be needed. Overdosage: If you think you have taken too much of this medicine contact a poison control center or emergency room at once. NOTE: This medicine is only for you. Do not share this medicine with others. What if I miss a dose? If you miss a dose, take it as soon as you can. If it is almost time for your next dose, take only that dose. Do not take double or extra doses. What may interact with this medication? Do not take this medication with any of the following: Cisapride Dronedarone Pimozide Safinamide Samidorphan Thioridazine This medication may also interact with the following: Alcohol Antihistamines for allergy, cough, and cold Atropine Benzodiazepines, such as alprazolam, diazepam, or lorazepam Certain antibiotics, such as clarithromycin, erythromycin Certain antivirals for hepatitis or HIV Certain medications for bladder problems, such as oxybutynin or tolterodine Certain medications for depression or mental health conditions Certain medications for fungal infections, such as fluconazole, ketoconazole, posaconazole Certain medications for migraine headache like almotriptan, eletriptan, frovatriptan, naratriptan, rizatriptan, sumatriptan, zolmitriptan Certain medications for nausea or vomiting like dolasetron, ondansetron, palonosetron Certain medications for seizures, such as carbamazepine, phenobarbital, phenytoin Certain medications for stomach  problems, such as dicyclomine or  hyoscyamine Certain medications for travel sickness, such as scopolamine Certain medications for Parkinson disease, such as benztropine or trihexyphenidyl Ipratropium Linezolid MAOIs, such as Marplan, Nardil, and Parnate Medications that cause drowsiness before a procedure, such as propofol Medications that help you fall asleep Medications that relax muscles Methylene blue (injected into a vein) Other medications that cause heart rhythm changes Opioid medications for pain or cough Phenothiazines, such as chlorpromazine, prochlorperazine Rifampin This list may not describe all possible interactions. Give your health care provider a list of all the medicines, herbs, non-prescription drugs, or dietary supplements you use. Also tell them if you smoke, drink alcohol, or use illegal drugs. Some items may interact with your medicine. What should I watch for while using this medication? Visit your care team regularly. For this medication to be most effective, you should attend any counseling or support groups that your care team recommends. Do not try to overcome the effects of the medication by taking large amounts of opioids. This can cause severe problems including death. Also, you may be more sensitive to lower doses of opioids after you stop taking this medication. Wear a medical ID bracelet or chain. Carry a card that describes your condition. List the medications and doses you take on the card. Taking this medication with other substances that cause drowsiness, such as alcohol, benzodiazepines, or other opioids can cause serious side effects. Give your care team a list of all medications you use. They will tell you how much medication to take. Do not take more medication than directed. Call emergency services if you have problems breathing or staying awake. Long term use of this medication may cause your brain and body to depend on it. This can happen even when used as directed by your care team.  You and your care team will work together to determine how long you will need to take this medication. If your care team wants you to stop this medication, the dose will be slowly lowered over time to reduce the risk of side effects. Naloxone is an emergency medication used for an opioid overdose. An overdose can happen if you take too much of an opioid. It can also happen if an opioid is taken with some other medications or substances such as alcohol. Know the symptoms of an overdose, such as trouble breathing, unusually tired or sleepy, or not being able to respond or wake up. Make sure to tell caregivers and close contacts where your naloxone is stored. Make sure they know how to use it. After naloxone is given, the person giving it must call emergency services. Naloxone is a temporary treatment. Repeat doses may be needed. This medication may affect your coordination, reaction time, or judgment. Do not drive or operate machinery until you know how this medication affects you. Sit up or stand slowly to reduce the risk of dizzy or fainting spells. Drinking alcohol with this medication can increase the risk of these side effects. This medication will cause constipation. If you do not have a bowel movement for 3 days, call your care team. Your mouth may get dry. Chewing sugarless gum or sucking hard candy and drinking plenty of water may help. Contact your care team if the problem does not go away or is severe. Talk to your care team if you are pregnant or think you might be pregnant. This medication can cause serious birth defects if taken during pregnancy. Prolonged use of this medication during pregnancy can cause  withdrawal in a newborn. Talk to your care team before breastfeeding. Changes to your treatment plan may be needed. If you breastfeed while taking this medication, seek medical care right away if you notice the child has slow or noisy breathing, is unusually sleepy or not able to wake up, or is  limp. Long-term use of this medication may cause infertility. Talk to your care team if you are concerned about your fertility. What side effects may I notice from receiving this medication? Side effects that you should report to your care team as soon as possible: Allergic reactions--skin rash, itching, hives, swelling of the face, lips, tongue, or throat CNS depression--slow or shallow breathing, shortness of breath, feeling faint, dizziness, confusion, trouble staying awake Liver injury--right upper belly pain, loss of appetite, nausea, light-colored stool, dark yellow or brown urine, yellowing skin or eyes, unusual weakness or fatigue Low adrenal gland function--nausea, vomiting, loss of appetite, unusual weakness or fatigue, dizziness Low blood pressure--dizziness, feeling faint or lightheaded, blurry vision Side effects that usually do not require medical attention (report to your care team if they continue or are bothersome): Constipation Dizziness Drowsiness Dry mouth Headache Nausea Vomiting This list may not describe all possible side effects. Call your doctor for medical advice about side effects. You may report side effects to FDA at 1-800-FDA-1088. Where should I keep my medication? Keep out of the reach of children and pets. This medication can be abused. Keep it in a safe place to protect it from theft. Do not share it with anyone. It is only for you. Selling or giving away this medication is dangerous and against the law. Store at room temperature at 25 degrees C (77 degrees F). Keep this medication in the original packaging until you are ready to take it. Get rid of any unused medication after the expiration date. This medication may cause harm and death if it is taken by other adults, children, or pets. It is important to get rid of the medication as soon as you no longer need it, or it is expired. You can do this in two ways: Take the medication to a medication take-back  program. Check with your pharmacy or law enforcement to find a location. If you cannot return the medication, remove it from the packaging and flush it down the toilet. NOTE: This sheet is a summary. It may not cover all possible information. If you have questions about this medicine, talk to your doctor, pharmacist, or health care provider.  2023 Elsevier/Gold Standard (2009-08-20 00:00:00)

## 2022-12-25 ENCOUNTER — Telehealth: Payer: Self-pay | Admitting: Pharmacist

## 2022-12-25 NOTE — Telephone Encounter (Signed)
Attempted to contact patient for follow-up of tobacco intake reduction / cessation.   Left HIPAA compliant voice mail requesting call back to direct phone: [phone]  Total time with patient call and documentation of interaction: 7 minutes.  Additional follow-up phone call planned: 1 weeks

## 2022-12-28 ENCOUNTER — Telehealth: Payer: Self-pay | Admitting: Pharmacist

## 2022-12-28 DIAGNOSIS — Z87891 Personal history of nicotine dependence: Secondary | ICD-10-CM

## 2022-12-28 NOTE — Assessment & Plan Note (Signed)
Patient contacted for follow/up of tobacco intake reduction / cessation attempt.   Since last contact patient reports high level excitation with quitting for a year.  We discussed risk of triggers.  Admits that she has had several episodes of considering cigarettes.  Added that Husband is also smoking much less.   She noted that she has gained 20-30        lbs and is now interested in focusing on her weight.  She has an exercise plan in mind.   Medications currently being used; None Patient denies any significant side effects from tobacco cessation therapy.   Continues to rate CONFIDENCE of quitting tobacco as high.  Most common triggers to use tobacco include; stress and smell of her old brand   Provided information on 1 800-QUIT NOW support program.   Total time with patient call and documentation of interaction: 14 minutes. Follow-up phone call planned: None at this time.  I would like to say congratulations in person at her PCP visit with Dr. Adah Salvage on 1/30 if I am available.

## 2022-12-28 NOTE — Telephone Encounter (Signed)
Noted and agree. 

## 2022-12-28 NOTE — Telephone Encounter (Signed)
Patient contacted for follow/up of tobacco intake reduction / cessation attempt.   Since last contact patient reports high level excitation with quitting for a year.  We discussed risk of triggers.  Admits that she has had several episodes of considering cigarettes.  Added that Husband is also smoking much less.   She noted that she has gained 20-30        lbs and is now interested in focusing on her weight.  She has an exercise plan in mind.   Medications currently being used; None Patient denies any significant side effects from tobacco cessation therapy.   Continues to rate CONFIDENCE of quitting tobacco as high.  Most common triggers to use tobacco include; stress and smell of her old brand   Provided information on 1 800-QUIT NOW support program.   Total time with patient call and documentation of interaction: 14 minutes. Follow-up phone call planned: None at this time.  I would like to say congratulations in person at her PCP visit with Dr. Adah Salvage on 1/30 if I am available.

## 2022-12-28 NOTE — Telephone Encounter (Signed)
-----   Message from Leavy Cella, [place] sent at 07/04/2022  1:31 PM EDT ----- Regarding: Tobacco Cessation - for 1 year

## 2023-01-16 ENCOUNTER — Ambulatory Visit (INDEPENDENT_AMBULATORY_CARE_PROVIDER_SITE_OTHER): Payer: 59 | Admitting: Student

## 2023-01-16 ENCOUNTER — Encounter: Payer: Self-pay | Admitting: Student

## 2023-01-16 VITALS — BP 106/62 | HR 102 | Wt 164.0 lb

## 2023-01-16 DIAGNOSIS — E2839 Other primary ovarian failure: Secondary | ICD-10-CM | POA: Diagnosis not present

## 2023-01-16 DIAGNOSIS — Z0000 Encounter for general adult medical examination without abnormal findings: Secondary | ICD-10-CM

## 2023-01-16 DIAGNOSIS — F1121 Opioid dependence, in remission: Secondary | ICD-10-CM | POA: Diagnosis not present

## 2023-01-16 LAB — POCT URINE DRUG SCREEN
POC Amphetamine UR: NOT DETECTED
POC BENZODIAZEPINES UR: NOT DETECTED
POC Barbiturate UR: NOT DETECTED
POC Buprenorphine (BUP): POSITIVE — AB
POC Cocaine UR: NOT DETECTED
POC Ecstasy UR: NOT DETECTED
POC Marijuana UR: NOT DETECTED
POC Methadone UR: NOT DETECTED
POC Methamphetamine UR: NOT DETECTED
POC Opiate Ur: NOT DETECTED
POC Oxycodone UR: NOT DETECTED
POC PH URINE: NORMAL
POC PHENCYCLIDINE UR: NOT DETECTED
POC SPECIFIC GRAVITY URINE: NORMAL
URINE TEMPERATURE: 90 Degrees F (ref 90.0–100.0)

## 2023-01-16 MED ORDER — BUPRENORPHINE HCL-NALOXONE HCL 8-2 MG SL FILM: ORAL_FILM | SUBLINGUAL | 0 refills | Status: DC

## 2023-01-16 NOTE — Assessment & Plan Note (Signed)
Stable on Suboxone.  UDS today was negative.  Patient expressed interest in slowly weaning off Suboxone.-A decision will look to wean Suboxone in 3 months. -Obtained UDS -Refilled Suboxone -Will consider weaning Suboxone slowly starting in 3 months.

## 2023-01-16 NOTE — Progress Notes (Signed)
     SUBJECTIVE:   CHIEF COMPLAINT / HPI: Annual Wellness Visit   Patient is a 37 y.o. year old female presenting for annual well ness visit. Report feeling well overall, with no concerns other than expressing desire to wean off suboxone due to effect on dentition. She reports consuming a general diet with good appetite. Denies unexplained weight loss. She reports sleeping well Report no exercise but active overall She doesn't work but goes to school Lives with - Husband, 4 sons and 1 daughter  Tobacco use- quit 1 year ago Alcohol use- ocassioinaly Sexually active with one partner and inconsistent with condom use.   PERTINENT  PMH / PSH: Reviewed  OBJECTIVE:   Vitals:   01/16/23 1400  BP: 106/62  Pulse: (!) 102  SpO2: 98%    Physical Exam General: Alert, well appearing, NAD Cardiovascular: RRR, No Murmurs, Normal S2/S2 Respiratory: CTAB, No wheezing or Rales Abdomen: No distension or tenderness Extremities: No edema on extremities   Skin: Warm and dry Psych: Pleasant, appropriate response to questioning.  ASSESSMENT/PLAN:   37 year old female presenting for annual physical.  Overall healthy with no concerns today. -Obtain lab for CBC -Lab to screen for HIV and syphilis  Annual wellness visit done today including the all of the following: Reviewed patient's Family Medical History Reviewed and updated list of patient's medical providers Assessment of cognitive impairment was done Assessed patient's functional ability Established a written schedule for health screening [place] Completed and Reviewed  Opiate addiction ([place]) Stable on Suboxone.  UDS today was negative.  Patient expressed interest in slowly weaning off Suboxone.-A decision will look to wean Suboxone in 3 months. -Obtained UDS -Refilled Suboxone -Will consider weaning Suboxone slowly starting in 3 months.      Alen Bleacher, MD [place]

## 2023-01-16 NOTE — Patient Instructions (Addendum)
It was wonderful to see you today. Thank you for allowing me to be a part of your care. Below is a short summary of what we discussed at your visit today:  Overall healthy with no concerns today.  We did a drug screen today that was negative.  Today we ordered lab for blood counts, HIV and syphilis.  I refilled your Suboxone today.  Please bring all of your medications to every appointment!  If you have any questions or concerns, please do not hesitate to contact us via phone or MyChart message.   Alen Bleacher, MD [facility]

## 2023-01-17 ENCOUNTER — Encounter: Payer: Self-pay | Admitting: Student

## 2023-01-17 DIAGNOSIS — F1121 Opioid dependence, in remission: Secondary | ICD-10-CM

## 2023-01-17 LAB — CBC WITH DIFFERENTIAL/PLATELET
Basophils Absolute: 0 10*3/uL (ref 0.0–0.2)
Basos: 1 %
EOS (ABSOLUTE): 0.4 10*3/uL (ref 0.0–0.4)
Eos: 9 %
Hematocrit: 34.3 % (ref 34.0–46.6)
Hemoglobin: 11.4 g/dL (ref 11.1–15.9)
Immature Grans (Abs): 0 10*3/uL (ref 0.0–0.1)
Immature Granulocytes: 0 %
Lymphocytes Absolute: 2.1 10*3/uL (ref 0.7–3.1)
Lymphs: 45 %
MCH: 27.5 pg (ref 26.6–33.0)
MCHC: 33.2 g/dL (ref 31.5–35.7)
MCV: 83 fL (ref 79–97)
Monocytes Absolute: 0.4 10*3/uL (ref 0.1–0.9)
Monocytes: 8 %
Neutrophils Absolute: 1.7 10*3/uL (ref 1.4–7.0)
Neutrophils: 37 %
Platelets: 285 10*3/uL (ref 150–450)
RBC: 4.14 x10E6/uL (ref 3.77–5.28)
RDW: 11.9 % (ref 11.7–15.4)
WBC: 4.6 10*3/uL (ref 3.4–10.8)

## 2023-01-17 LAB — RPR: RPR Ser Ql: NONREACTIVE

## 2023-01-17 LAB — HIV ANTIBODY (ROUTINE TESTING W REFLEX): HIV Screen 4th Generation wRfx: NONREACTIVE

## 2023-01-17 MED ORDER — BUPRENORPHINE HCL-NALOXONE HCL 8-2 MG SL FILM: ORAL_FILM | SUBLINGUAL | 0 refills | Status: DC

## 2023-01-26 ENCOUNTER — Other Ambulatory Visit: Payer: Self-pay | Admitting: General Surgery

## 2023-01-26 DIAGNOSIS — N6320 Unspecified lump in the left breast, unspecified quadrant: Secondary | ICD-10-CM

## 2023-01-26 DIAGNOSIS — R9230 Dense breasts, unspecified: Secondary | ICD-10-CM | POA: Diagnosis not present

## 2023-01-26 DIAGNOSIS — Z803 Family history of malignant neoplasm of breast: Secondary | ICD-10-CM | POA: Diagnosis not present

## 2023-01-26 DIAGNOSIS — R922 Inconclusive mammogram: Secondary | ICD-10-CM | POA: Diagnosis not present

## 2023-01-26 DIAGNOSIS — N6321 Unspecified lump in the left breast, upper outer quadrant: Secondary | ICD-10-CM | POA: Diagnosis not present

## 2023-02-09 ENCOUNTER — Ambulatory Visit: Payer: 59 | Admitting: Student

## 2023-02-14 ENCOUNTER — Telehealth: Payer: Self-pay | Admitting: Student

## 2023-02-14 NOTE — Telephone Encounter (Signed)
Called patient to schedule Medicare Annual Wellness Visit (AWV). Left message for patient to call back and schedule Medicare Annual Wellness Visit (AWV).  Last date of AWV: AWVI eligible as of 05/19/2015  Please schedule an appointment at any time with [facility].  If any questions, please contact me at [phone].    Thank you,  [place] Direct dial  [phone]

## 2023-02-15 ENCOUNTER — Encounter: Payer: Self-pay | Admitting: Student

## 2023-02-15 ENCOUNTER — Other Ambulatory Visit (HOSPITAL_COMMUNITY)
Admission: RE | Admit: 2023-02-15 | Discharge: 2023-02-15 | Disposition: A | Discharge status: Home / Self Care | Payer: 59 | Source: Ambulatory Visit | Attending: Family Medicine | Admitting: Family Medicine

## 2023-02-15 ENCOUNTER — Ambulatory Visit (INDEPENDENT_AMBULATORY_CARE_PROVIDER_SITE_OTHER): Payer: 59 | Admitting: Student

## 2023-02-15 VITALS — BP 125/75 | HR 75 | Temp 99.2°F | Ht 66.0 in | Wt 168.8 lb

## 2023-02-15 DIAGNOSIS — Z113 Encounter for screening for infections with a predominantly sexual mode of transmission: Secondary | ICD-10-CM | POA: Diagnosis present

## 2023-02-15 DIAGNOSIS — F1121 Opioid dependence, in remission: Secondary | ICD-10-CM

## 2023-02-15 MED ORDER — BUPRENORPHINE HCL-NALOXONE HCL 8-2 MG SL FILM: ORAL_FILM | SUBLINGUAL | 0 refills | Status: DC

## 2023-02-15 NOTE — Assessment & Plan Note (Signed)
Refilled patient's Suboxone.  Follow-up in 4 weeks.

## 2023-02-15 NOTE — Patient Instructions (Addendum)
It was wonderful to see you today. Thank you for allowing me to be a part of your care. Below is a short summary of what we discussed at your visit today:  Refilled your Suboxone today. Follow-up in a month.  Collected urine sample today for your STD testing that was collected.  Actual on a wellness visit.  Please bring all of your medications to every appointment!  If you have any questions or concerns, please do not hesitate to contact us via phone or MyChart message.   Alen Bleacher, MD [facility]

## 2023-02-15 NOTE — Progress Notes (Signed)
    SUBJECTIVE:   CHIEF COMPLAINT / HPI:   37 year old female on chronic Suboxone treatment presenting today for medication refills.  She has no concerns and endorses tolerance to the medication.  PERTINENT  PMH / PSH: Reviewed  OBJECTIVE:   BP 125/75   Pulse 75   Temp 99.2 F (37.3 C)   Ht 5' 6"$  (1.676 m)   Wt 168 lb 12.8 oz (76.6 kg)   SpO2 100%   BMI 27.25 kg/m    Physical Exam General: Alert, well appearing, NAD Cardiovascular: RRR, well-perfused Respiratory: Normal WOB on RA Skin: Warm and dry  ASSESSMENT/PLAN:   Opiate addiction ([place]) Refilled patient's Suboxone.  Follow-up in 4 weeks.   HCM Collected Urine sample for STD testing.  Patient was unable to give samples at her last visit which was an annual wellness.   Alen Bleacher, MD [place]

## 2023-02-16 LAB — URINE CYTOLOGY ANCILLARY ONLY
Chlamydia: NEGATIVE
Comment: NEGATIVE
Comment: NEGATIVE
Comment: NORMAL
Neisseria Gonorrhea: NEGATIVE
Trichomonas: NEGATIVE

## 2023-02-23 ENCOUNTER — Ambulatory Visit
Admission: RE | Admit: 2023-02-23 | Discharge: 2023-02-23 | Disposition: A | Discharge status: Home / Self Care | Payer: 59 | Source: Ambulatory Visit | Attending: General Surgery | Admitting: General Surgery

## 2023-02-23 DIAGNOSIS — N6320 Unspecified lump in the left breast, unspecified quadrant: Secondary | ICD-10-CM

## 2023-02-23 DIAGNOSIS — N6489 Other specified disorders of breast: Secondary | ICD-10-CM | POA: Diagnosis not present

## 2023-02-23 DIAGNOSIS — R922 Inconclusive mammogram: Secondary | ICD-10-CM | POA: Diagnosis not present

## 2023-03-06 ENCOUNTER — Other Ambulatory Visit: Payer: Self-pay | Admitting: General Surgery

## 2023-03-06 DIAGNOSIS — R9230 Dense breasts, unspecified: Secondary | ICD-10-CM

## 2023-03-06 DIAGNOSIS — Z803 Family history of malignant neoplasm of breast: Secondary | ICD-10-CM

## 2023-03-06 DIAGNOSIS — N6321 Unspecified lump in the left breast, upper outer quadrant: Secondary | ICD-10-CM

## 2023-03-15 ENCOUNTER — Other Ambulatory Visit: Payer: Self-pay | Admitting: Student

## 2023-03-15 ENCOUNTER — Encounter: Payer: Self-pay | Admitting: Student

## 2023-03-15 DIAGNOSIS — F1121 Opioid dependence, in remission: Secondary | ICD-10-CM

## 2023-03-15 MED ORDER — BUPRENORPHINE HCL-NALOXONE HCL 8-2 MG SL FILM: ORAL_FILM | SUBLINGUAL | 0 refills | Status: DC

## 2023-03-20 ENCOUNTER — Encounter: Payer: Self-pay | Admitting: Student

## 2023-03-20 ENCOUNTER — Ambulatory Visit (INDEPENDENT_AMBULATORY_CARE_PROVIDER_SITE_OTHER): Payer: 59 | Admitting: Student

## 2023-03-20 DIAGNOSIS — F1121 Opioid dependence, in remission: Secondary | ICD-10-CM | POA: Diagnosis not present

## 2023-03-20 MED ORDER — BUPRENORPHINE HCL-NALOXONE HCL 8-2 MG SL FILM: ORAL_FILM | SUBLINGUAL | 0 refills | Status: DC

## 2023-03-20 NOTE — Progress Notes (Signed)
    SUBJECTIVE:   CHIEF COMPLAINT / HPI:   Patient is a 37 year old Suboxone treatment presenting today for medication refill. She has been on the medication for 5 years. No concerns and endorses tolerance with medications.  PERTINENT  PMH / PSH: Reviewed   OBJECTIVE:   BP 119/79   Pulse 64   Ht 5\' 6"  (1.676 m)   Wt 171 lb (77.6 kg)   LMP 03/06/2023   SpO2 100%   BMI 27.60 kg/m    Physical Exam General: Alert, well appearing, NAD Cardiovascular: RRR, No Murmurs, Normal S2/S2 Respiratory: CTAB, No wheezing or Rales Abdomen: No distension or tenderness Psych: Pleasant, normal affect  ASSESSMENT/PLAN:   Opiate addiction ([place]) Patient is doing well Suboxone treatment.  No complaints.  We will plan to taper starting next month [id number]) per patient request. -Refilled Suboxone 8 mg twice daily -Will look to taper to 8mg  in the morning and 4 mg in the evening.  -Will complete UDS at next appointment      Alen Bleacher, MD [place]

## 2023-03-20 NOTE — Patient Instructions (Signed)
It was wonderful to see you today. Thank you for allowing me to be a part of your care. Below is a short summary of what we discussed at your visit today:  Refilled your suboxone  We will try to taper down to 8 mg in the morning and 4 mg in the evening starting next month.  Please bring all of your medications to every appointment!  If you have any questions or concerns, please do not hesitate to contact us via phone or MyChart message.   Alen Bleacher, MD [facility]

## 2023-03-20 NOTE — Assessment & Plan Note (Signed)
Patient is doing well Suboxone treatment.  No complaints.  We will plan to taper starting next month [id number]) per patient request. -Refilled Suboxone 8 mg twice daily -Will look to taper to 8mg  in the morning and 4 mg in the evening.  -Will complete UDS at next appointment

## 2023-03-28 ENCOUNTER — Other Ambulatory Visit: Payer: 59

## 2023-04-18 ENCOUNTER — Other Ambulatory Visit: Payer: Self-pay

## 2023-04-18 ENCOUNTER — Other Ambulatory Visit: Payer: Self-pay | Admitting: Student

## 2023-04-18 DIAGNOSIS — F1121 Opioid dependence, in remission: Secondary | ICD-10-CM

## 2023-04-20 ENCOUNTER — Other Ambulatory Visit: Payer: 59

## 2023-04-21 ENCOUNTER — Encounter: Payer: Self-pay | Admitting: Student

## 2023-04-22 MED ORDER — BUPRENORPHINE HCL-NALOXONE HCL 8-2 MG SL FILM: ORAL_FILM | SUBLINGUAL | 0 refills | Status: DC

## 2023-04-23 ENCOUNTER — Ambulatory Visit: Payer: 59 | Admitting: Student

## 2023-04-25 ENCOUNTER — Ambulatory Visit (INDEPENDENT_AMBULATORY_CARE_PROVIDER_SITE_OTHER): Payer: 59 | Admitting: Student

## 2023-04-25 ENCOUNTER — Telehealth: Payer: Self-pay | Admitting: Student

## 2023-04-25 ENCOUNTER — Other Ambulatory Visit: Payer: Self-pay | Admitting: Student

## 2023-04-25 ENCOUNTER — Encounter: Payer: Self-pay | Admitting: Student

## 2023-04-25 VITALS — BP 124/72 | HR 80 | Ht 66.0 in | Wt 170.0 lb

## 2023-04-25 DIAGNOSIS — F1121 Opioid dependence, in remission: Secondary | ICD-10-CM | POA: Diagnosis not present

## 2023-04-25 MED ORDER — METHOCARBAMOL 500 MG PO TABS: 500.0000 mg | ORAL_TABLET | Freq: Two times a day (BID) | ORAL | 0 refills | Status: DC

## 2023-04-25 MED ORDER — BUPRENORPHINE HCL-NALOXONE HCL 8-2 MG SL FILM: ORAL_FILM | SUBLINGUAL | 0 refills | Status: DC

## 2023-04-25 MED ORDER — BUPRENORPHINE HCL-NALOXONE HCL 4-1 MG SL FILM: ORAL_FILM | SUBLINGUAL | 0 refills | Status: DC

## 2023-04-25 NOTE — Assessment & Plan Note (Addendum)
Endorses compliance and good tolerance to chronic Suboxone use.  As discussed in preceding months about patient's interest in tapering down on Suboxone. Discussed going down from 8-2 mg twice daily to 8-2 mg in the morning and 4-1 mg in the evening.  Patient verbalized understanding and agreeable to plan. -Updated Suboxone prescription to 8-2 mg in the a.m. and 4-1 mg in p.m. -UDS collected -Reviewed return precautions with patient and plans to revert back to previous dose if not tolerating change in Suboxone dose.

## 2023-04-25 NOTE — Progress Notes (Signed)
    SUBJECTIVE:   CHIEF COMPLAINT / HPI:   37 year old female chronic Suboxone treatment for opioid dependence is presenting today for medication refills.  She endorses compliance and tolerance to her medication.  Pressed desire to slowly taper her Suboxone.  PERTINENT  PMH / PSH: Reviewed  OBJECTIVE:   BP 124/72   Pulse 80   Ht 5\' 6"  (1.676 m)   Wt 170 lb (77.1 kg)   LMP 04/25/2023   SpO2 98%   BMI 27.44 kg/m    Physical Exam General: Alert, well appearing, NAD Cardiovascular: RRR, No Murmurs, Normal S2/S2 Respiratory: CTAB, No wheezing or Rales Extremities: No edema on extremities   Psych: Normal affect, appropriate judgment  ASSESSMENT/PLAN:   Opiate addiction (HCC) Endorses compliance and good tolerance to chronic Suboxone use.  As discussed in preceding months about patient's interest in tapering down on Suboxone. Discussed going down from 8-2 mg twice daily to 8-2 mg in the morning and 4-1 mg in the evening.  Patient verbalized understanding and agreeable to plan. -Updated Suboxone prescription to 8-2 mg in the a.m. and 4-1 mg in p.m. -UDS collected -Reviewed return precautions with patient and plans to revert back to previous dose if not tolerating change in Suboxone dose.     Jerre Simon, MD [facility]

## 2023-04-25 NOTE — Telephone Encounter (Signed)
**   After-hours call **  Received call from patient about suboxone taper which she is actively participating in. Went to [facility] in [place], [place] and unable to get her two separate Suboxone scripts filled due to issues with prescription information.  Pharmacy needs more specification, per patient.  Called [facility] to try and clarify that script is for Buprenorphine HCl- Naloxone 8-2 #1 film under the tongue in the morning; and Buprenorphine HCl-Naloxone 4-1 mg #1 film under the tongue in the evening.  The pharmacist accepted verbal orders over the phone for changes to the prescriptions. They are out of stock of the 4-1 dosage, which is not frequently prescribed, and will place an order for it.  I tried to call patient back x2 to let her know that it may take a few days to receive the lower dosage, with no answer.   Will route to PCP.  Darral Dash, DO

## 2023-04-25 NOTE — Patient Instructions (Signed)
It was wonderful to SEE you today. Thank you for allowing me to be a part of your care. Below is a short summary of what we discussed at your visit today:  Today we are starting to taper your Suboxone.  As discussed you will take the 8-2 mg in the morning and 4-1 mg in the evenings.  How you do with this change, if you are not doing well with it please reach out and we can go back to your previous dose.  Your routine drug screen was collected today.  See the results on MyChart.  Please bring all of your medications to every appointment!  If you have any questions or concerns, please do not hesitate to contact us via phone or MyChart message.   Jerre Simon, MD [facility]

## 2023-04-28 LAB — TOXASSURE SELECT 13 (MW), URINE

## 2023-04-30 ENCOUNTER — Encounter: Payer: Self-pay | Admitting: Student

## 2023-05-07 ENCOUNTER — Encounter: Payer: Self-pay | Admitting: Student

## 2023-05-07 DIAGNOSIS — F1121 Opioid dependence, in remission: Secondary | ICD-10-CM

## 2023-05-08 MED ORDER — BUPRENORPHINE HCL-NALOXONE HCL 8-2 MG SL FILM: ORAL_FILM | SUBLINGUAL | 0 refills | Status: DC

## 2023-05-09 ENCOUNTER — Telehealth: Payer: Self-pay | Admitting: Student

## 2023-05-09 NOTE — Telephone Encounter (Signed)
**   After- Hours Call **  Patient having difficulty getting script filled for Suboxone 8-2 mg film #28 tablets sent in by PCP yesterday.She has enough to get through today, but will run out tomorrow.  Needs provider to call pharmacy to verify that this is NOT an early refill and that the dose is increased from prior script as she was trying to wean, but did not tolerate well.  I called [facility] pharmacy in [place], [place] [phone]) to verify prescription and to please fill the script.  In the future, please advise that all early prescriptions for controlled substances will need a call to the pharmacy from physician to verify that it is appropriate for refill.  Will route to PCP.  Darral Dash, DO PGY-2 [facility]

## 2023-05-24 ENCOUNTER — Encounter: Payer: Self-pay | Admitting: Student

## 2023-05-24 ENCOUNTER — Ambulatory Visit (INDEPENDENT_AMBULATORY_CARE_PROVIDER_SITE_OTHER): Payer: 59 | Admitting: Student

## 2023-05-24 DIAGNOSIS — F1121 Opioid dependence, in remission: Secondary | ICD-10-CM | POA: Diagnosis not present

## 2023-05-24 MED ORDER — BUPRENORPHINE HCL-NALOXONE HCL 8-2 MG SL FILM: ORAL_FILM | SUBLINGUAL | 0 refills | Status: AC

## 2023-05-24 NOTE — Progress Notes (Signed)
    SUBJECTIVE:   CHIEF COMPLAINT / HPI:   Patient is a 37 year old female on long-term Suboxone treatment.  He presenting for medication refill.  Last month attempted to taper to 8-2 mg in the morning and 4-1 mg in the evening.  Patient did not tolerate taper and so was returned back to 8-2 mg twice daily.  She is good tolerance and compliance on 8-2 mg daily.  Today has no concerns or complaints.  PERTINENT  PMH / PSH: Reviewed  OBJECTIVE:   BP 116/68   Pulse 70   Ht 5\' 6"  (1.676 m)   Wt 170 lb (77.1 kg)   LMP 04/25/2023   SpO2 98%   BMI 27.44 kg/m    Physical Exam General: Alert, well appearing, NAD Cardiovascular: RRR, well-perfused Respiratory: Normal work of breathing on room air Abdomen: No distension or tenderness Extremities: No edema on extremities     ASSESSMENT/PLAN:   Opiate addiction (HCC) Patient did not respond well to an attempted taper of her long-term Suboxone therapy.  We will return to her usual 8-2 mg twice daily.  Endorses compliance and tolerance to her medication. -Refilled her Suboxone 8-2 mg twice daily. -Reviewed return precautions which patient verbalized understanding. -Follow-up in a month     Jerre Simon, MD [facility]

## 2023-05-24 NOTE — Patient Instructions (Addendum)
It was wonderful to meet you today. Thank you for allowing me to be a part of your care. Below is a short summary of what we discussed at your visit today:  Given you did not do too well with the reduced dose will go back to the 8-2 mg 2 times daily.  I have sent in your refill.  Follow-up in a month.  Please bring all of your medications to every appointment!  If you have any questions or concerns, please do not hesitate to contact us via phone or MyChart message.   Jerre Simon, MD [facility]

## 2023-05-24 NOTE — Assessment & Plan Note (Signed)
Patient did not respond well to an attempted taper of her long-term Suboxone therapy.  We will return to her usual 8-2 mg twice daily.  Endorses compliance and tolerance to her medication. -Refilled her Suboxone 8-2 mg twice daily. -Reviewed return precautions which patient verbalized understanding. -Follow-up in a month

## 2023-06-17 ENCOUNTER — Encounter: Payer: Self-pay | Admitting: Student

## 2023-06-17 ENCOUNTER — Telehealth: Payer: Self-pay | Admitting: Student

## 2023-06-17 NOTE — Telephone Encounter (Signed)
**  After Hours/ Emergency Line Call**  Received a page to call [phone]) - [phone].  Patient: Rhonda Cabrera  Caller: Self  Confirmed name & DOB of patient with caller  Subjective:  Is calling for refill of methocarbamol.     Objective:  Observations: NAD  Assessment & Plan  Rhonda Cabrera is a 37 y.o. female complaining of painful periods, noting that her cycle just started and she's noting a lot of pain. She says the robaxin is the only thing that helps, and she didn't want to have to go to the ED for the med. Patient note's she had her med refilled over the after hours line before, and was hoping to get the same today. I told her we can't refill meds over the after hours line, but that I would message her provider.   Provider wanting to discourage patients using the after hours line for med refills. Especially as this has happen before.   Recommendations:  Refill medicine  -- Will forward to PCP.  Bess Kinds, MD [facility], PGY-1

## 2023-06-17 NOTE — Telephone Encounter (Signed)
**  After Hours/ Emergency Line Call** 336 Received a page to call [phone].  Patient: Rhonda Cabrera  Caller:  ** Did not answer phone Confirmed name & DOB of patient with caller  Subjective:  Received page requesting call back to [phone], called number but no answer, left message requesting call back.    Objective:    Assessment & Plan  Rhonda Cabrera is a 37 y.o. female    Recommendations:      -- Will forward to PCP.  Bess Kinds, MD [facility], PGY-1

## 2023-06-18 ENCOUNTER — Encounter: Payer: Self-pay | Admitting: Student

## 2023-06-18 ENCOUNTER — Other Ambulatory Visit: Payer: Self-pay | Admitting: Student

## 2023-06-18 MED ORDER — METHOCARBAMOL 500 MG PO TABS: 500.0000 mg | ORAL_TABLET | Freq: Two times a day (BID) | ORAL | 0 refills | Status: DC

## 2023-06-22 ENCOUNTER — Ambulatory Visit (INDEPENDENT_AMBULATORY_CARE_PROVIDER_SITE_OTHER): Payer: 59 | Admitting: Student

## 2023-06-22 ENCOUNTER — Encounter: Payer: Self-pay | Admitting: Student

## 2023-06-22 VITALS — BP 107/78 | HR 83 | Ht 67.0 in | Wt 172.0 lb

## 2023-06-22 DIAGNOSIS — F1121 Opioid dependence, in remission: Secondary | ICD-10-CM

## 2023-06-22 MED ORDER — METHOCARBAMOL 500 MG PO TABS: 500.0000 mg | ORAL_TABLET | Freq: Two times a day (BID) | ORAL | 2 refills | Status: DC

## 2023-06-22 MED ORDER — BUPRENORPHINE HCL-NALOXONE HCL 8-2 MG SL FILM: ORAL_FILM | SUBLINGUAL | 0 refills | Status: DC

## 2023-06-22 NOTE — Patient Instructions (Addendum)
It was wonderful to meet you today. Thank you for allowing me to be a part of your care. Below is a short summary of what we discussed at your visit today:  We refilled your medication today.   I have also placed refill for your Robaxin. Remember only take medication as needed  Please bring all of your medications to every appointment!  If you have any questions or concerns, please do not hesitate to contact us via phone or MyChart message.   Jerre Simon, MD [facility]

## 2023-06-22 NOTE — Assessment & Plan Note (Signed)
Patient on long-term Suboxone with good tolerance.  No concerns today. -Refilled her Suboxone -Follow-up in 4 weeks.

## 2023-06-22 NOTE — Progress Notes (Signed)
    SUBJECTIVE:   CHIEF COMPLAINT / HPI:   Patient is a 37 year old female presenting today for Suboxone refills.  She has been on chronic Suboxone therapy for opioid dependence.  She endorses compliance and good tolerance.  No issues today, reports taking Wet martini supplement by juice bar for libido and vaginal dryness during intercourse.  PERTINENT  PMH / PSH: Reviewed   OBJECTIVE:   BP 107/78   Pulse 83   Ht 5\' 7"  (1.702 m)   Wt 172 lb (78 kg)   SpO2 98%   BMI 26.94 kg/m    Physical Exam General: Alert, well appearing, NAD Cardiovascular: RRR, No Murmurs, Normal S2/S2 Respiratory: CTAB, No wheezing or Rales Psych: Normal affect, pleasant.  ASSESSMENT/PLAN:   Opiate addiction (HCC) Patient on long-term Suboxone with good tolerance.  No concerns today. -Refilled her Suboxone -Follow-up in 4 weeks.    Jerre Simon, MD [facility]

## 2023-06-30 ENCOUNTER — Telehealth: Payer: Self-pay | Admitting: Student

## 2023-06-30 NOTE — Telephone Encounter (Signed)
Patient calls to report that her grandson was recently diagnosed with pneumonia. She has other young children in the home and wants to know if there are any precautions that they need to take or if the children need to come to the ER because of this exposure. Reassured her that most pneumonias are not contagious and that there is no need for her or the children to present to care in the absence of symptoms.  Expressed that we would be happy to see them in the office if they develop symptoms but that there was no need for ED presentation unless someone developed respiratory distress.   Eliezer Mccoy, MD

## 2023-07-18 ENCOUNTER — Ambulatory Visit (INDEPENDENT_AMBULATORY_CARE_PROVIDER_SITE_OTHER): Payer: 59 | Admitting: Student

## 2023-07-18 ENCOUNTER — Encounter: Payer: Self-pay | Admitting: Student

## 2023-07-18 DIAGNOSIS — F1121 Opioid dependence, in remission: Secondary | ICD-10-CM | POA: Diagnosis not present

## 2023-07-18 MED ORDER — BUPRENORPHINE HCL-NALOXONE HCL 8-2 MG SL FILM: ORAL_FILM | SUBLINGUAL | 0 refills | Status: DC

## 2023-07-18 NOTE — Assessment & Plan Note (Signed)
Continues to have good tolerance on medication.  Denies any side effects and doing well so far. -Refilled her Suboxone -Will obtain UDS at next visit. -Follow-up in a month.

## 2023-07-18 NOTE — Progress Notes (Signed)
    SUBJECTIVE:   CHIEF COMPLAINT / HPI:   Patient is a 37 year old female presenting today for refill of her Suboxone.  She has been on chronic Suboxone for over 5 years due to opioid dependence.  Recently failed attempt to taper her Suboxone on and so she is back on her normal dose of 8-2 Suboxone.  This is compliance.  She states she is doing well overall and recently started a new supplement ,chlorophyll to help cleanse her body.  Started the supplement about a week ago.  PERTINENT  PMH / PSH: Reviewed  OBJECTIVE:   BP 102/66   Pulse (!) 53   Wt 167 lb (75.8 kg)   LMP 07/10/2023   BMI 26.16 kg/m    Physical Exam General: Alert, well appearing, NAD, Oriented x4 Cardiovascular: RRR, No Murmurs, Normal S2/S2 Respiratory: CTAB, No wheezing or Rales Abdomen: No distension or tenderness Psych: Normal affect and pleasant mood   ASSESSMENT/PLAN:   Opiate addiction (HCC) Continues to have good tolerance on medication.  Denies any side effects and doing well so far. -Refilled her Suboxone -Will obtain UDS at next visit. -Follow-up in a month.     Jerre Simon, MD [facility]

## 2023-07-19 ENCOUNTER — Encounter: Payer: Self-pay | Admitting: Student

## 2023-07-19 DIAGNOSIS — F1121 Opioid dependence, in remission: Secondary | ICD-10-CM

## 2023-07-19 MED ORDER — BUPRENORPHINE HCL-NALOXONE HCL 8-2 MG SL FILM: ORAL_FILM | SUBLINGUAL | 0 refills | Status: DC

## 2023-07-19 NOTE — Telephone Encounter (Signed)
Called and spoke with patient.   Advised I was simply relaying the prescription information from the pharmacy.   I understand her frustration with continuous DEA issues with PCP. However, I can only go by what the pharmacy tells me.   For good measure will ask AM preceptor to send this in as PCPs DEA has not worked in the past and we will be closed on 8/3.  Will cancel PCP prescription at the pharmacy.

## 2023-07-20 ENCOUNTER — Other Ambulatory Visit: Payer: Self-pay | Admitting: Family Medicine

## 2023-07-20 DIAGNOSIS — F1121 Opioid dependence, in remission: Secondary | ICD-10-CM

## 2023-07-20 MED ORDER — BUPRENORPHINE HCL-NALOXONE HCL 8-2 MG SL FILM: ORAL_FILM | SUBLINGUAL | 0 refills | Status: DC

## 2023-07-20 NOTE — Progress Notes (Signed)
60 tabs buprenorphine filled.   CMA will call to cancel the 50 tabs sent.

## 2023-08-14 ENCOUNTER — Other Ambulatory Visit: Payer: Self-pay

## 2023-08-14 ENCOUNTER — Encounter: Payer: Self-pay | Admitting: Student

## 2023-08-14 ENCOUNTER — Ambulatory Visit (INDEPENDENT_AMBULATORY_CARE_PROVIDER_SITE_OTHER): Payer: 59 | Admitting: Student

## 2023-08-14 DIAGNOSIS — F1121 Opioid dependence, in remission: Secondary | ICD-10-CM | POA: Diagnosis not present

## 2023-08-14 MED ORDER — BUPRENORPHINE HCL-NALOXONE HCL 8-2 MG SL FILM: ORAL_FILM | SUBLINGUAL | 0 refills | Status: AC

## 2023-08-14 NOTE — Progress Notes (Signed)
    SUBJECTIVE:   CHIEF COMPLAINT / HPI:   37 year old female on long-term Suboxone presenting today for medication refill.  She endorses good compliance and tolerance to medications.  No side effects, she feels well overall and in good spirit  PERTINENT  PMH / PSH: Reviewed  OBJECTIVE:   BP 111/75   Pulse 76   Ht 5\' 7"  (1.702 m)   Wt 171 lb 12.8 oz (77.9 kg)   LMP 07/10/2023   SpO2 99%   BMI 26.91 kg/m    Physical Exam General: Pleasant, well appearing, NAD Cardiovascular: RRR, No Murmurs, Normal S2/S2 Respiratory: CTAB, No wheezing or Rales Abdomen: No distension or tenderness Extremities: No edema on extremities    ASSESSMENT/PLAN:   Opiate addiction (HCC) Patient endorses good tolerance to medication.  No concerns today. -Reviewed her Suboxone -UDS ordered -Follow-up in a month     Jerre Simon, MD [facility]

## 2023-08-14 NOTE — Patient Instructions (Signed)
It was wonderful to see you today. Thank you for allowing me to be a part of your care. Below is a short summary of what we discussed at your visit today:  I have refilled your Suboxone.  Today we are obtaining urine sample for routine drug screening.   If you have any questions or concerns, please do not hesitate to contact us via phone or MyChart message.   Jerre Simon, MD [facility]

## 2023-08-14 NOTE — Assessment & Plan Note (Addendum)
Patient endorses good tolerance to medication.  No concerns today. -Reviewed her Suboxone -UDS ordered -Follow-up in a month

## 2023-08-20 LAB — TOXASSURE SELECT 13 (MW), URINE

## 2023-08-21 ENCOUNTER — Other Ambulatory Visit: Payer: Self-pay

## 2023-08-21 ENCOUNTER — Emergency Department (HOSPITAL_BASED_OUTPATIENT_CLINIC_OR_DEPARTMENT_OTHER): Payer: 59

## 2023-08-21 ENCOUNTER — Encounter (HOSPITAL_BASED_OUTPATIENT_CLINIC_OR_DEPARTMENT_OTHER): Payer: Self-pay | Admitting: Emergency Medicine

## 2023-08-21 ENCOUNTER — Emergency Department (HOSPITAL_BASED_OUTPATIENT_CLINIC_OR_DEPARTMENT_OTHER)
Admission: EM | Admit: 2023-08-21 | Discharge: 2023-08-21 | Discharge status: Against Medical Advice | Payer: 59 | Attending: Emergency Medicine | Admitting: Emergency Medicine

## 2023-08-21 DIAGNOSIS — Z5321 Procedure and treatment not carried out due to patient leaving prior to being seen by health care provider: Secondary | ICD-10-CM | POA: Diagnosis not present

## 2023-08-21 DIAGNOSIS — R079 Chest pain, unspecified: Secondary | ICD-10-CM | POA: Insufficient documentation

## 2023-08-21 LAB — COMPREHENSIVE METABOLIC PANEL
ALT: 13 U/L (ref 0–44)
AST: 18 U/L (ref 15–41)
Albumin: 3.9 g/dL (ref 3.5–5.0)
Alkaline Phosphatase: 57 U/L (ref 38–126)
Anion gap: 8 (ref 5–15)
BUN: 8 mg/dL (ref 6–20)
CO2: 26 mmol/L (ref 22–32)
Calcium: 8.9 mg/dL (ref 8.9–10.3)
Chloride: 102 mmol/L (ref 98–111)
Creatinine, Ser: 0.73 mg/dL (ref 0.44–1.00)
GFR, Estimated: >60 mL/min (ref >60)
Glucose, Bld: 92 mg/dL (ref 70–99)
Potassium: 3.6 mmol/L (ref 3.5–5.1)
Sodium: 136 mmol/L (ref 135–145)
Total Bilirubin: 0.6 mg/dL (ref 0.3–1.2)
Total Protein: 6.8 g/dL (ref 6.5–8.1)

## 2023-08-21 LAB — CBC
HCT: 34.2 % — ABNORMAL LOW (ref 36.0–46.0)
Hemoglobin: 10.8 g/dL — ABNORMAL LOW (ref 12.0–15.0)
MCH: 27.6 pg (ref 26.0–34.0)
MCHC: 31.6 g/dL (ref 30.0–36.0)
MCV: 87.5 fL (ref 80.0–100.0)
Platelets: 279 10*3/uL (ref 150–400)
RBC: 3.91 MIL/uL (ref 3.87–5.11)
RDW: 13 % (ref 11.5–15.5)
WBC: 5.2 10*3/uL (ref 4.0–10.5)
nRBC: 0 % (ref 0.0–0.2)

## 2023-08-21 LAB — TROPONIN I (HIGH SENSITIVITY): Troponin I (High Sensitivity): <2 ng/L (ref <18)

## 2023-08-21 LAB — PREGNANCY, URINE: Preg Test, Ur: NEGATIVE

## 2023-08-21 NOTE — ED Triage Notes (Signed)
Left sided cp that started 20 mins ago, denies n/v sob , just had lunch

## 2023-08-29 ENCOUNTER — Ambulatory Visit (INDEPENDENT_AMBULATORY_CARE_PROVIDER_SITE_OTHER): Payer: 59

## 2023-08-29 VITALS — Ht 66.0 in | Wt 171.0 lb

## 2023-08-29 DIAGNOSIS — Z0000 Encounter for general adult medical examination without abnormal findings: Secondary | ICD-10-CM

## 2023-08-29 NOTE — Patient Instructions (Signed)
Rhonda Cabrera , Thank you for taking time to come for your Medicare Wellness Visit. I appreciate your ongoing commitment to your health goals. Please review the following plan we discussed and let me know if I can assist you in the future.   Referrals/Orders/Follow-Ups/Clinician Recommendations: Aim for 30 minutes of exercise or brisk walking, 6-8 glasses of water, and 5 servings of fruits and vegetables each day.  This is a list of the screening recommended for you and due dates:  Health Maintenance  Topic Date Due   Flu Shot  07/19/2023   COVID-19 Vaccine (3 - 2023-24 season) 08/19/2023   Pap Smear  05/06/2024   Medicare Annual Wellness Visit  08/28/2024   DTaP/Tdap/Td vaccine (4 - Td or Tdap) 11/14/2031   Hepatitis C Screening  Completed   HIV Screening  Completed   HPV Vaccine  Aged Out    Advanced directives: (Provided) Advance directive discussed with you today. I have provided a copy for you to complete at home and have notarized. Once this is complete, please bring a copy in to our office so we can scan it into your chart.   Next Medicare Annual Wellness Visit scheduled for next year: Yes

## 2023-08-29 NOTE — Progress Notes (Signed)
Subjective:   Rhonda Cabrera is a 37 y.o. female who presents for an Initial Medicare Annual Wellness Visit.  Visit Complete: Virtual  I connected with  [name] on 08/29/23 by a audio enabled telemedicine application and verified that I am speaking with the correct person using two identifiers.  Patient Location: Home  Provider Location: Home Office  I discussed the limitations of evaluation and management by telemedicine. The patient expressed understanding and agreed to proceed.  Vital Signs: Because this visit was a virtual/telehealth visit, some criteria may be missing or patient reported. Any vitals not documented were not able to be obtained and vitals that have been documented are patient reported.   Review of Systems     Cardiac Risk Factors include: smoking/ tobacco exposure     Objective:    Today's Vitals   08/29/23 0953  Weight: 171 lb (77.6 kg)  Height: 5\' 6"  (1.676 m)   Body mass index is 27.6 kg/m.     08/29/2023    9:59 AM 07/18/2023   10:27 AM 06/22/2023    1:37 PM 05/24/2023    1:59 PM 03/20/2023    2:19 PM 02/15/2023    2:04 PM 01/16/2023    2:01 PM  Advanced Directives  Does Patient Have a Medical Advance Directive? No No No No No No No  Would patient like information on creating a medical advance directive? Yes (MAU/Ambulatory/Procedural Areas - Information given) No - Patient declined  No - Patient declined No - Patient declined  No - Patient declined    Current Medications (verified) Outpatient Encounter Medications as of 08/29/2023  Medication Sig   Biotin 1 MG CAPS Take by mouth.   Buprenorphine HCl-Naloxone HCl 8-2 MG FILM 1 Film (1 each total) under the tongue two times daily. DAW1: Please only give brand name   methocarbamol (ROBAXIN) 500 MG tablet Take 1 tablet (500 mg total) by mouth 2 (two) times daily.   Multiple Vitamin (MULTIVITAMIN) capsule Take 1 capsule by mouth daily.   No facility-administered encounter  medications on file as of 08/29/2023.    Allergies (verified) Seroquel xr [quetiapine fumarate er]   History: Past Medical History:  Diagnosis Date   Abnormal Pap smear    Anemia    Anxiety    Asthma    Bipolar 1 disorder (HCC)    Chlamydia 07/12/2012   Depression    Family history of adverse reaction to anesthesia    Headache    miraines   Hypertension    with pregnancy only   Opiate addiction (HCC)    Pneumonia 2017   used inhaler    Polysubstance abuse (HCC)    PONV (postoperative nausea and vomiting)    SEVERE ITCHING AFTER EPIDURAL   Past Surgical History:  Procedure Laterality Date   CERVICAL CONIZATION W/BX N/A 02/19/2018   Procedure: CONIZATION CERVIX WITH BIOPSY - COLD KNIFE;  Surgeon: Allie Bossier, MD;  Location: [facility];  Service: Gynecology;  Laterality: N/A;   CHOLECYSTECTOMY  11/04/2012   Procedure: LAPAROSCOPIC CHOLECYSTECTOMY WITH INTRAOPERATIVE CHOLANGIOGRAM;  Surgeon: Wilmon Arms. Corliss Skains, MD;  Location: [facility];  Service: General;  Laterality: N/A;   LAPAROSCOPY N/A 02/19/2018   Procedure: LAPAROSCOPY DIAGNOSTIC WITH PERITONEAL BIOPSIES;  Surgeon: Allie Bossier, MD;  Location: [facility];  Service: Gynecology;  Laterality: N/A;   TUBAL LIGATION  09/08/2011   Procedure: POST PARTUM TUBAL LIGATION;  Surgeon: Scheryl Darter, MD;  Location: [facility];  Service: Gynecology;  Laterality: Bilateral;  vaginal deliveries     Family History  Problem Relation Age of Onset   Drug abuse Mother    Drug abuse Father    Diabetes Maternal Grandmother    Hypertension Maternal Grandmother    Diabetes Paternal Grandmother    Diabetes Paternal Grandfather    Breast cancer Paternal Aunt    Social History   Socioeconomic History   Marital status: Married    Spouse name: Not on file   Number of children: 6   Years of education: Not on file   Highest education level: Not on file  Occupational History   Occupation: UNEMPLOYED  Tobacco Use   Smoking status: Former    Current  packs/day: 0.00    Average packs/day: 0.5 packs/day for 12.0 years (6.0 ttl pk-yrs)    Types: Cigarettes    Start date: 02/12/2010    Quit date: 02/12/2022    Years since quitting: 1.5    Passive exposure: Past   Smokeless tobacco: Never   Tobacco comments:    vaped for 1 month  Vaping Use   Vaping status: Never Used  Substance and Sexual Activity   Alcohol use: Not Currently    Comment: socially    Drug use: Not Currently    Frequency: 7.0 times per week    Comment:  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   Sexual activity: Yes    Partners: Male    Birth control/protection: Surgical  Other Topics Concern   Not on file  Social History Narrative   Daily caffeine    Social Determinants of Health   Financial Resource Strain: Not on file  Food Insecurity: Not on file  Transportation Needs: Not on file  Physical Activity: Not on file  Stress: Not on file  Social Connections: Not on file    Tobacco Counseling Counseling given: Not Answered Tobacco comments: vaped for 1 month   Clinical Intake:  Pre-visit preparation completed: Yes  Pain : No/denies pain     Diabetes: No  How often do you need to have someone help you when you read instructions, pamphlets, or other written materials from your doctor or pharmacy?: 1 - Never  Interpreter Needed?: No  Information entered by :: Kandis Fantasia LPN   Activities of Daily Living    08/29/2023    9:58 AM  In your present state of health, do you have any difficulty performing the following activities:  Hearing? 0  Vision? 0  Difficulty concentrating or making decisions? 0  Walking or climbing stairs? 0  Dressing or bathing? 0  Doing errands, shopping? 0  Preparing Food and eating ? N  Using the Toilet? N  In the past six months, have you accidently leaked urine? N  Do you have problems with loss of bowel control? N  Managing your Medications? N  Managing your Finances? N  Housekeeping or managing your  Housekeeping? N    Patient Care Team: Jerre Simon, MD as PCP - General (Family Medicine) Jodelle Red, MD as PCP - Cardiology (Cardiology) Almond Lint, MD as Consulting Physician (General Surgery)  Indicate any recent Medical Services you may have received from other than [facility] providers in the past year (date may be approximate).     Assessment:   This is a routine wellness examination for [place].  Hearing/Vision screen Hearing Screening - Comments:: Denies hearing difficulties   Vision Screening - Comments:: No vision problems; will schedule routine eye exam soon     Goals Addressed  None   Depression Screen    08/14/2023    1:48 PM 07/18/2023   10:28 AM 05/24/2023    1:59 PM 03/20/2023    2:19 PM 02/15/2023    2:23 PM 01/16/2023    2:24 PM 12/15/2022    1:23 PM  PHQ 2/9 Scores  PHQ - 2 Score 2 1 1 1 3  0 1  PHQ- 9 Score 7 6 9 9 12 4 9     Fall Risk    08/29/2023    9:57 AM 08/14/2023    1:48 PM 06/27/2022   11:07 AM 04/14/2021    4:07 PM 10/11/2020    1:28 PM  Fall Risk   Falls in the past year? 0 0 0 0 0  Number falls in past yr: 0 0 0 0 0  Injury with Fall? 0  0 0 0  Risk for fall due to : No Fall Risks      Follow up Falls prevention discussed;Education provided;Falls evaluation completed        MEDICARE RISK AT HOME: Medicare Risk at Home Any stairs in or around the home?: No If so, are there any without handrails?: No Home free of loose throw rugs in walkways, pet beds, electrical cords, etc?: Yes Adequate lighting in your home to reduce risk of falls?: Yes Life alert?: No Use of a cane, walker or w/c?: No Grab bars in the bathroom?: No Shower chair or bench in shower?: No Elevated toilet seat or a handicapped toilet?: No  TIMED UP AND GO:  Was the test performed? No    Cognitive Function:        08/29/2023    9:59 AM  6CIT Screen  What Year? 0 points  What month? 0 points  What time? 0 points  Count back from 20 0 points  Months  in reverse 0 points  Repeat phrase 0 points  Total Score 0 points    Immunizations Immunization History  Administered Date(s) Administered   H1N1 12/08/2008   Influenza Whole 10/15/2007, 12/08/2008, 10/08/2009   PFIZER(Purple Top)SARS-COV-2 Vaccination 04/01/2020, 04/26/2020   PPD Test 06/20/2011   Td 07/23/2005   Tdap 09/08/2011, 11/13/2021    TDAP status: Up to date  Flu Vaccine status: Declined, Education has been provided regarding the importance of this vaccine but patient still declined. Advised may receive this vaccine at local pharmacy or Health Dept. Aware to provide a copy of the vaccination record if obtained from local pharmacy or Health Dept. Verbalized acceptance and understanding.  Pneumococcal vaccine status: Up to date  Covid-19 vaccine status: Information provided on how to obtain vaccines.   Qualifies for Shingles Vaccine? No    Screening Tests Health Maintenance  Topic Date Due   INFLUENZA VACCINE  07/19/2023   COVID-19 Vaccine (3 - 2023-24 season) 08/19/2023   PAP SMEAR-Modifier  05/06/2024   Medicare Annual Wellness (AWV)  08/28/2024   DTaP/Tdap/Td (4 - Td or Tdap) 11/14/2031   Hepatitis C Screening  Completed   HIV Screening  Completed   HPV VACCINES  Aged Out    Health Maintenance  Health Maintenance Due  Topic Date Due   INFLUENZA VACCINE  07/19/2023   COVID-19 Vaccine (3 - 2023-24 season) 08/19/2023    Mammogram status: Completed 02/23/23. Repeat every year  Lung Cancer Screening: (Low Dose CT Chest recommended if Age 73-80 years, 20 pack-year currently smoking OR have quit w/in 15years.) does not qualify.   Lung Cancer Screening Referral: n/a  Additional Screening:  Hepatitis  C Screening: does qualify; Completed 05/06/21  Vision Screening: Recommended annual ophthalmology exams for early detection of glaucoma and other disorders of the eye. Is the patient up to date with their annual eye exam?  No  Who is the provider or what is the  name of the office in which the patient attends annual eye exams? none If pt is not established with a provider, would they like to be referred to a provider to establish care? No .   Dental Screening: Recommended annual dental exams for proper oral hygiene  Community Resource Referral / Chronic Care Management: CRR required this visit?  No   CCM required this visit?  No     Plan:     I have personally reviewed and noted the following in the patient's chart:   Medical and social history Use of alcohol, tobacco or illicit drugs  Current medications and supplements including opioid prescriptions. Patient is currently taking opioid prescriptions. Information provided to patient regarding non-opioid alternatives. Patient advised to discuss non-opioid treatment plan with their provider. Functional ability and status Nutritional status Physical activity Advanced directives List of other physicians Hospitalizations, surgeries, and ER visits in previous 12 months Vitals Screenings to include cognitive, depression, and falls Referrals and appointments  In addition, I have reviewed and discussed with patient certain preventive protocols, quality metrics, and best practice recommendations. A written personalized care plan for preventive services as well as general preventive health recommendations were provided to patient.     Kandis Fantasia [place], [place]   1/61/0960   After Visit Summary: (MyChart) Due to this being a telephonic visit, the after visit summary with patients personalized plan was offered to patient via MyChart   Nurse Notes: Patient would like to discuss referral to behavioral health at upcoming appointment

## 2023-09-14 ENCOUNTER — Encounter: Payer: Self-pay | Admitting: Student

## 2023-09-14 ENCOUNTER — Ambulatory Visit (INDEPENDENT_AMBULATORY_CARE_PROVIDER_SITE_OTHER): Payer: 59 | Admitting: Student

## 2023-09-14 VITALS — BP 115/70 | HR 64 | Ht 67.0 in | Wt 167.6 lb

## 2023-09-14 DIAGNOSIS — F1121 Opioid dependence, in remission: Secondary | ICD-10-CM | POA: Diagnosis not present

## 2023-09-14 MED ORDER — SUBOXONE 8-2 MG SL FILM: ORAL_FILM | SUBLINGUAL | 0 refills | Status: DC

## 2023-09-14 MED ORDER — METHOCARBAMOL 500 MG PO TABS: 500.0000 mg | ORAL_TABLET | Freq: Two times a day (BID) | ORAL | 2 refills | Status: DC

## 2023-09-14 NOTE — Patient Instructions (Signed)
It was wonderful to see you today. Thank you for allowing me to be a part of your care. Below is a short summary of what we discussed at your visit today:  Refilled  your Suboxone and you should be able to pick it up.  Follow-up in a month for refill.  If you have any questions or concerns, please do not hesitate to contact us via phone or MyChart message.   Jerre Simon, MD [facility]

## 2023-09-14 NOTE — Progress Notes (Cosign Needed Addendum)
    SUBJECTIVE:   CHIEF COMPLAINT / HPI:   Patient is a 37 year old female with history of opioid dependence presenting today for refill of her Suboxone. She has been on long-term Suboxone endorses compliance and good tolerance to medication.  PERTINENT  PMH / PSH: Reviewed   OBJECTIVE:   BP 115/70   Pulse 64   Ht 5\' 7"  (1.702 m)   Wt 167 lb 9.6 oz (76 kg)   LMP 08/20/2023   BMI 26.25 kg/m    Physical Exam General: Alert, well appearing, NAD Cardiovascular: RRR, No Murmurs, Normal S2/S2 Respiratory: CTAB, No wheezing or Rales Abdomen: No distension or tenderness Extremities: No edema on extremities    ASSESSMENT/PLAN:   Opiate addiction (HCC) Patient compliant with good tolerance to medication.  Refilled her Suboxone. -Follow-up in 1 month.     Jerre Simon, MD [facility]

## 2023-09-14 NOTE — Assessment & Plan Note (Signed)
Patient compliant with good tolerance to medication.  Refilled her Suboxone. -Follow-up in 1 month.

## 2023-10-10 ENCOUNTER — Ambulatory Visit (INDEPENDENT_AMBULATORY_CARE_PROVIDER_SITE_OTHER): Payer: 59 | Admitting: Student

## 2023-10-10 ENCOUNTER — Encounter: Payer: Self-pay | Admitting: Student

## 2023-10-10 DIAGNOSIS — F1121 Opioid dependence, in remission: Secondary | ICD-10-CM | POA: Diagnosis not present

## 2023-10-10 MED ORDER — SUBOXONE 8-2 MG SL FILM: ORAL_FILM | SUBLINGUAL | 0 refills | Status: DC

## 2023-10-10 NOTE — Patient Instructions (Addendum)
It was wonderful to see you today. Thank you for allowing me to be a part of your care. Below is a short summary of what we discussed at your visit today:  I have sent in refills on your medication.  Moving forward we will see you every 3 months for refills.  When your refill is due please reach out so I can do it electronically.  However I will need to see you every 3 months.  If you have any questions or concerns, please do not hesitate to contact us via phone or MyChart message.   Jerre Simon, MD [facility]

## 2023-10-10 NOTE — Assessment & Plan Note (Signed)
Patient on chronic Suboxone with good tolerance and compliance with her medication. -Refilled her Suboxone.

## 2023-10-10 NOTE — Progress Notes (Signed)
    SUBJECTIVE:   CHIEF COMPLAINT / HPI:   37 year old female history of opioid dependence presenting today for refill on her Suboxone.  She has been on chronic Suboxone use with good tolerance and compliance.  Today she denies any side effect and reports being in good spirit.  PERTINENT  PMH / PSH: Reviewed   OBJECTIVE:   BP 119/81   Pulse 87   Ht 5\' 7"  (1.702 m)   Wt 166 lb 3.2 oz (75.4 kg)   SpO2 100%   BMI 26.03 kg/m    Physical Exam General: Alert, well appearing, NAD Cardiovascular: RRR, well-perfused Respiratory: Good work of breathing on RA Extremities: No edema on extremities   Skin: Warm and dry  ASSESSMENT/PLAN:   Opiate addiction (HCC) Patient on chronic Suboxone with good tolerance and compliance with her medication. -Refilled her Suboxone.     Jerre Simon, MD [facility]

## 2023-10-29 ENCOUNTER — Other Ambulatory Visit: Payer: Self-pay

## 2023-11-02 ENCOUNTER — Other Ambulatory Visit: Payer: Self-pay | Admitting: Student

## 2023-11-02 DIAGNOSIS — F1121 Opioid dependence, in remission: Secondary | ICD-10-CM

## 2023-11-08 MED ORDER — SUBOXONE 8-2 MG SL FILM: ORAL_FILM | SUBLINGUAL | 0 refills | Status: DC

## 2023-12-07 ENCOUNTER — Other Ambulatory Visit: Payer: Self-pay | Admitting: Student

## 2023-12-07 DIAGNOSIS — F1121 Opioid dependence, in remission: Secondary | ICD-10-CM

## 2023-12-07 MED ORDER — SUBOXONE 8-2 MG SL FILM: ORAL_FILM | SUBLINGUAL | 0 refills | Status: DC

## 2023-12-13 ENCOUNTER — Other Ambulatory Visit: Payer: Self-pay

## 2023-12-13 ENCOUNTER — Encounter (HOSPITAL_BASED_OUTPATIENT_CLINIC_OR_DEPARTMENT_OTHER): Payer: Self-pay

## 2023-12-13 ENCOUNTER — Emergency Department (HOSPITAL_BASED_OUTPATIENT_CLINIC_OR_DEPARTMENT_OTHER)
Admission: EM | Admit: 2023-12-13 | Discharge: 2023-12-13 | Disposition: A | Discharge status: Home / Self Care | Payer: 59 | Attending: Emergency Medicine | Admitting: Emergency Medicine

## 2023-12-13 DIAGNOSIS — R519 Headache, unspecified: Secondary | ICD-10-CM | POA: Insufficient documentation

## 2023-12-13 DIAGNOSIS — I10 Essential (primary) hypertension: Secondary | ICD-10-CM | POA: Insufficient documentation

## 2023-12-13 DIAGNOSIS — J45909 Unspecified asthma, uncomplicated: Secondary | ICD-10-CM | POA: Insufficient documentation

## 2023-12-13 LAB — CBC
HCT: 35.8 % — ABNORMAL LOW (ref 36.0–46.0)
Hemoglobin: 11.3 g/dL — ABNORMAL LOW (ref 12.0–15.0)
MCH: 27.6 pg (ref 26.0–34.0)
MCHC: 31.6 g/dL (ref 30.0–36.0)
MCV: 87.5 fL (ref 80.0–100.0)
Platelets: 289 10*3/uL (ref 150–400)
RBC: 4.09 MIL/uL (ref 3.87–5.11)
RDW: 13 % (ref 11.5–15.5)
WBC: 3.4 10*3/uL — ABNORMAL LOW (ref 4.0–10.5)
nRBC: 0 % (ref 0.0–0.2)

## 2023-12-13 LAB — BASIC METABOLIC PANEL
Anion gap: 6 (ref 5–15)
BUN: 13 mg/dL (ref 6–20)
CO2: 27 mmol/L (ref 22–32)
Calcium: 8.9 mg/dL (ref 8.9–10.3)
Chloride: 104 mmol/L (ref 98–111)
Creatinine, Ser: 0.66 mg/dL (ref 0.44–1.00)
GFR, Estimated: >60 mL/min (ref >60)
Glucose, Bld: 99 mg/dL (ref 70–99)
Potassium: 4 mmol/L (ref 3.5–5.1)
Sodium: 137 mmol/L (ref 135–145)

## 2023-12-13 LAB — PREGNANCY, URINE: Preg Test, Ur: NEGATIVE

## 2023-12-13 MED ORDER — DIPHENHYDRAMINE HCL 50 MG/ML IJ SOLN
25.0000 mg | Freq: Once | INTRAMUSCULAR | Status: AC
  Administered 2023-12-13: 25 mg via INTRAVENOUS
  Filled 2023-12-13: qty 1

## 2023-12-13 MED ORDER — DEXAMETHASONE SODIUM PHOSPHATE 10 MG/ML IJ SOLN
10.0000 mg | Freq: Once | INTRAMUSCULAR | Status: AC
  Administered 2023-12-13: 10 mg via INTRAVENOUS
  Filled 2023-12-13: qty 1

## 2023-12-13 MED ORDER — PROCHLORPERAZINE EDISYLATE 10 MG/2ML IJ SOLN
10.0000 mg | Freq: Once | INTRAMUSCULAR | Status: AC
  Administered 2023-12-13: 10 mg via INTRAVENOUS
  Filled 2023-12-13: qty 2

## 2023-12-13 MED ORDER — KETOROLAC TROMETHAMINE 15 MG/ML IJ SOLN
15.0000 mg | Freq: Once | INTRAMUSCULAR | Status: DC
  Filled 2023-12-13: qty 1

## 2023-12-13 NOTE — ED Notes (Signed)
Went to discharge patient and same was not in the room. Confirmed patient did leave prior to receiving DC paperwork. Patient moved off floor.

## 2023-12-13 NOTE — ED Triage Notes (Signed)
Patient here POV from Home.  Endorses having a Migraine for a few hours. States she was already awake and then it began.   Has attempted Suboxone, Ibuprofen, and Coffee without relief.   Some nausea. No fevers. No V/D.   NAD Noted during triage. A&Ox4. Gcs 15. Ambulatory

## 2023-12-13 NOTE — Discharge Instructions (Signed)
As we discussed, your workup in the ER today was reassuring for acute findings.  Laboratory evaluation did not reveal any emergent cause of your symptoms.  Given the you are feeling better after medications, no further evaluation is indicated.  I recommend that you get plenty of rest and maintain adequate oral hydration specifically with fluids high in electrolytes such as Gatorade or Pedialyte.  Please follow-up with your primary care doctor at your earliest convenience.  Return if development of any new or worsening symptoms.

## 2023-12-13 NOTE — ED Provider Notes (Signed)
[place] [facility] Provider Note   CSN: [id number] Arrival date & time: 12/13/23  4098     History  Chief Complaint  Patient presents with   Migraine    Rhonda Cabrera is a 37 y.o. female.  Patient with history of anxiety, opioid dependence on Suboxone, migraines, hypertension presents today with complaints of headache.  She states that same as began gradually throughout the morning today and is located on the right side of her head and does not radiate.  She states this feels like headaches she has had previously.  States that she has been here before and was given IV medication which resolved her symptoms.  She presents requesting same.  States that she has tried Suboxone, ibuprofen, and coffee without any relief.  Denies weakness, vision changes, neck stiffness, numbness/tingling.  Does endorse some photophobia and nausea without vomiting.  No fevers or chills.  The history is provided by the patient. No language interpreter was used.  Migraine Associated symptoms include headaches.       Home Medications Prior to Admission medications   Medication Sig Start Date End Date Taking? Authorizing Provider  Biotin 1 MG CAPS Take by mouth.    [provider]  methocarbamol (ROBAXIN) 500 MG tablet Take 1 tablet (500 mg total) by mouth 2 (two) times daily. 09/14/23   Jerre Simon, MD  Multiple Vitamin (MULTIVITAMIN) capsule Take 1 capsule by mouth daily.    [provider]  SUBOXONE 8-2 MG FILM 1 Film (1 each total) under the tongue 2 (two) times daily. DAW1: Please only give brand name, Normal 12/07/23   Jerre Simon, MD      Allergies    Seroquel xr [quetiapine fumarate er]    Review of Systems   Review of Systems  Neurological:  Positive for headaches.  All other systems reviewed and are negative.   Physical Exam Updated Vital Signs BP 126/78 (BP Location: Right Arm)   Pulse (!) 56   Temp 98.5 F (36.9  C) (Oral)   Resp 18   Ht 5\' 8"  (1.727 m)   Wt 72.6 kg   SpO2 100%   BMI 24.33 kg/m  Physical Exam Vitals and nursing note reviewed.  Constitutional:      General: She is not in acute distress.    Appearance: Normal appearance. She is normal weight. She is not ill-appearing, toxic-appearing or diaphoretic.  HENT:     Head: Normocephalic and atraumatic.  Eyes:     Extraocular Movements: Extraocular movements intact.     Pupils: Pupils are equal, round, and reactive to light.  Neck:     Comments: No meningismus Cardiovascular:     Rate and Rhythm: Normal rate.  Pulmonary:     Effort: Pulmonary effort is normal. No respiratory distress.  Abdominal:     General: Abdomen is flat.     Palpations: Abdomen is soft.     Tenderness: There is no abdominal tenderness.  Musculoskeletal:        General: Normal range of motion.     Cervical back: Normal range of motion and neck supple.  Skin:    General: Skin is warm and dry.  Neurological:     General: No focal deficit present.     Mental Status: She is alert and oriented to person, place, and time.     GCS: GCS eye subscore is 4. GCS verbal subscore is 5. GCS motor subscore is 6.  Sensory: Sensation is intact.     Motor: Motor function is intact.     Coordination: Coordination is intact.     Gait: Gait is intact.     Comments: Alert and oriented to self, place, time and event.    Speech is fluent, clear without dysarthria or dysphasia.    Strength 5/5 in upper/lower extremities   Sensation intact in upper/lower extremities    CN I not tested  CN II grossly intact visual fields bilaterally. Did not visualize posterior eye.  CN III, IV, VI PERRLA and EOMs intact bilaterally  CN V Intact sensation to sharp and light touch to the face  CN VII facial movements symmetric  CN VIII not tested  CN IX, X no uvula deviation, symmetric rise of soft palate  CN XI 5/5 SCM and trapezius strength bilaterally  CN XII Midline tongue  protrusion, symmetric L/R movements   Psychiatric:        Mood and Affect: Mood normal.        Behavior: Behavior normal.     ED Results / Procedures / Treatments   Labs (all labs ordered are listed, but only abnormal results are displayed) Labs Reviewed  CBC - Abnormal; Notable for the following components:      Result Value   WBC 3.4 (*)    Hemoglobin 11.3 (*)    HCT 35.8 (*)    All other components within normal limits  BASIC METABOLIC PANEL  PREGNANCY, URINE    EKG None  Radiology No results found.  Procedures Procedures    Medications Ordered in ED Medications  ketorolac (TORADOL) 15 MG/ML injection 15 mg (has no administration in time range)  prochlorperazine (COMPAZINE) injection 10 mg (10 mg Intravenous Given 12/13/23 0951)  diphenhydrAMINE (BENADRYL) injection 25 mg (25 mg Intravenous Given 12/13/23 0950)  dexamethasone (DECADRON) injection 10 mg (10 mg Intravenous Given 12/13/23 8119)    ED Course/ Medical Decision Making/ A&P                                 Medical Decision Making Amount and/or Complexity of Data Reviewed Labs: ordered.  Risk Prescription drug management.   This patient is a 36 y.o. female who presents to the ED for concern of headache, this involves an extensive number of treatment options, and is a complaint that carries with it a high risk of complications and morbidity. The emergent differential diagnosis prior to evaluation includes, but is not limited to, Emergent considerations for headache include subarachnoid hemorrhage, meningitis, temporal arteritis, glaucoma, cerebral ischemia, carotid/vertebral dissection, intracranial tumor, Venous sinus thrombosis, carbon monoxide poisoning, acute or chronic subdural hemorrhage.  Other considerations include: Migraine, Cluster headache, Hypertension, Caffeine, alcohol, or drug withdrawal, Pseudotumor cerebri, Arteriovenous malformation, Preeclampsia, Tension headache, Sinusitis, Cervical  arthritis, Depression. This is not an exhaustive differential.   Past Medical History / Co-morbidities / Social History:  has a past medical history of Abnormal Pap smear, Anemia, Anxiety, Asthma, Bipolar 1 disorder (HCC), Chlamydia (07/12/2012), Depression, Family history of adverse reaction to anesthesia, Headache, Hypertension, Opiate addiction (HCC), Pneumonia (2017), Polysubstance abuse (HCC), and PONV (postoperative nausea and vomiting).  Additional history: Chart reviewed. Pertinent results include: seen here with similar appearing presentation on 7/23 and had negative CT imaging of the head. She was given benadryl, decadron, and compazine with improvement and discharged home.  Physical Exam: Physical exam performed. The pertinent findings include: Alert and oriented and neurologically intact  without focal deficits  Lab Tests: I ordered, and personally interpreted labs.  The pertinent results include:  WBC 3.4, hgb 11.3. upreg negative   Imaging Studies: Considered CT imaging, however patient with a history of migraines and symptoms consistent with same.  Also has prior CT imaging a year ago.   Medications: I ordered medication including Compazine, Benadryl, Decadron, and Toradol, compazine, and benadryl for headache. Reevaluation of the patient after these medicines showed that the patient improved. I have reviewed the patients home medicines and have made adjustments as needed.  Disposition: After consideration of the diagnostic results and the patients response to treatment, I feel that emergency department workup does not suggest an emergent condition requiring admission or immediate intervention beyond what has been performed at this time. The plan is: Discharge with close outpatient follow-up and return precautions. Patients work-up is benign and she is feeling better after headache cocktail. Suspect patients symptoms are from her migraines which she has a history of.  Discussed with  patient is understanding and in agreement with this. Evaluation and diagnostic testing in the emergency department does not suggest an emergent condition requiring admission or immediate intervention beyond what has been performed at this time.  Plan for discharge with close PCP follow-up.  Patient is understanding and amenable with plan, educated on red flag symptoms that would prompt immediate return.  Patient discharged in stable condition.  Final Clinical Impression(s) / ED Diagnoses Final diagnoses:  Acute nonintractable headache, unspecified headache type    Rx / DC Orders ED Discharge Orders     None     An After Visit Summary was printed and given to the patient.     Vear Clock 12/13/23 1232    Laurence Spates, MD 12/15/23 [phone]

## 2023-12-13 NOTE — ED Notes (Signed)
ED Provider at bedside. 

## 2024-01-11 ENCOUNTER — Ambulatory Visit (INDEPENDENT_AMBULATORY_CARE_PROVIDER_SITE_OTHER): Payer: 59 | Admitting: Student

## 2024-01-11 ENCOUNTER — Encounter: Payer: Self-pay | Admitting: Student

## 2024-01-11 DIAGNOSIS — F1121 Opioid dependence, in remission: Secondary | ICD-10-CM

## 2024-01-11 MED ORDER — SUBOXONE 8-2 MG SL FILM: ORAL_FILM | SUBLINGUAL | 0 refills | Status: DC

## 2024-01-11 NOTE — Progress Notes (Signed)
    SUBJECTIVE:   CHIEF COMPLAINT / HPI:   38 year old female with history of opioid dependence presenting today for medication refill.  She has been on Suboxone for over 5 years endorses good tolerance and compliance.  Denies any side effects.  PERTINENT  PMH / PSH: Reviewed  OBJECTIVE:   BP 123/75   Pulse 86   Wt 168 lb (76.2 kg)   SpO2 97%   BMI 25.54 kg/m    Physical Exam General: Alert, well appearing, NAD Cardiovascular: RRR, No Murmurs, Normal S2/S2 Respiratory: CTAB, No wheezing or Rales Psych: Pleasant with normal mood and affect.   ASSESSMENT/PLAN:   Opiate addiction (HCC) Doing well on current medication regimen. -Refilled her Suboxone    Jerre Simon, MD [facility]

## 2024-01-11 NOTE — Assessment & Plan Note (Signed)
Doing well on current medication regimen. -Refilled her Suboxone

## 2024-01-14 ENCOUNTER — Ambulatory Visit: Payer: Self-pay

## 2024-02-12 ENCOUNTER — Other Ambulatory Visit: Payer: Self-pay | Admitting: Student

## 2024-02-12 ENCOUNTER — Encounter: Payer: Self-pay | Admitting: Student

## 2024-02-12 DIAGNOSIS — F1121 Opioid dependence, in remission: Secondary | ICD-10-CM

## 2024-02-12 MED ORDER — SUBOXONE 8-2 MG SL FILM: ORAL_FILM | SUBLINGUAL | 0 refills | Status: DC

## 2024-02-12 NOTE — Progress Notes (Signed)
Suboxone refill

## 2024-02-21 ENCOUNTER — Ambulatory Visit
Admission: EM | Admit: 2024-02-21 | Discharge: 2024-02-21 | Disposition: A | Discharge status: Home / Self Care | Attending: Family Medicine | Admitting: Family Medicine

## 2024-02-21 DIAGNOSIS — R300 Dysuria: Secondary | ICD-10-CM | POA: Insufficient documentation

## 2024-02-21 DIAGNOSIS — R350 Frequency of micturition: Secondary | ICD-10-CM | POA: Diagnosis present

## 2024-02-21 LAB — POCT URINALYSIS DIP (MANUAL ENTRY)
Bilirubin, UA: NEGATIVE
Blood, UA: NEGATIVE
Glucose, UA: NEGATIVE mg/dL
Ketones, POC UA: NEGATIVE mg/dL
Nitrite, UA: NEGATIVE
Protein Ur, POC: 30 mg/dL — AB
Spec Grav, UA: >=1.03 — AB
Urobilinogen, UA: 1 U/dL
pH, UA: 6

## 2024-02-21 LAB — POCT URINE PREGNANCY: Preg Test, Ur: NEGATIVE

## 2024-02-21 MED ORDER — SULFAMETHOXAZOLE-TRIMETHOPRIM 800-160 MG PO TABS: 1.0000 | ORAL_TABLET | Freq: Two times a day (BID) | ORAL | 0 refills | Status: DC

## 2024-02-21 MED ORDER — FLUCONAZOLE 150 MG PO TABS: 150.0000 mg | ORAL_TABLET | ORAL | 0 refills | Status: DC

## 2024-02-21 NOTE — Discharge Instructions (Addendum)
Make sure you hydrate very well with plain water and a quantity of 80 ounces of water a day.  Please limit drinks that are considered urinary irritants such as soda, sweet tea, coffee, energy drinks, alcohol.  These can worsen your urinary and genital symptoms but also be the source of them.  I will let you know about your urine culture results through MyChart to see if we need to prescribe or change your antibiotics based off of those results.  

## 2024-02-21 NOTE — ED Triage Notes (Signed)
 Pt reports urinary urgency and pain when urinating started today. Pt took AZO.

## 2024-02-21 NOTE — ED Provider Notes (Signed)
 [facility]  Note:  This document was prepared using Conservation officer, historic buildings and may include unintentional dictation errors.  MRN: [id number] DOB: 1986/04/16  Subjective:   Rhonda Cabrera is a 38 y.o. female presenting for 1 day history of urinary frequency, dysuria.  Patient took Azo today.  Reports that she does not hydrate as well as she should.  She does drink urinary irritants.  No vaginal discharge.  No current facility-administered medications for this encounter.  Current Outpatient Medications:    phenazopyridine (PYRIDIUM) 95 MG tablet, Take 95 mg by mouth 3 (three) times daily as needed for pain., Disp: , Rfl:    vitamin E 180 MG (400 UNITS) capsule, Take 400 Units by mouth daily., Disp: , Rfl:    Biotin 1 MG CAPS, Take by mouth., Disp: , Rfl:    methocarbamol (ROBAXIN) 500 MG tablet, Take 1 tablet (500 mg total) by mouth 2 (two) times daily., Disp: 14 tablet, Rfl: 2   Multiple Vitamin (MULTIVITAMIN) capsule, Take 1 capsule by mouth daily., Disp: , Rfl:    SUBOXONE 8-2 MG FILM, 1 Film (1 each total) under the tongue 2 (two) times daily. DAW1: Please only give brand name, Normal, Disp: 60 each, Rfl: 0   Allergies  Allergen Reactions   Quetiapine Palpitations   Seroquel Xr [Quetiapine Fumarate Er] Palpitations    Past Medical History:  Diagnosis Date   Abnormal Pap smear    Anemia    Anxiety    Asthma    Bipolar 1 disorder (HCC)    Chlamydia 07/12/2012   Depression    Family history of adverse reaction to anesthesia    Headache    miraines   Hypertension    with pregnancy only   Opiate addiction (HCC)    Pneumonia 2017   used inhaler    Polysubstance abuse (HCC)    PONV (postoperative nausea and vomiting)    SEVERE ITCHING AFTER EPIDURAL     Past Surgical History:  Procedure Laterality Date   CERVICAL CONIZATION W/BX N/A 02/19/2018   Procedure: CONIZATION CERVIX WITH BIOPSY - COLD KNIFE;  Surgeon: Allie Bossier, MD;   Location: [facility];  Service: Gynecology;  Laterality: N/A;   CHOLECYSTECTOMY  11/04/2012   Procedure: LAPAROSCOPIC CHOLECYSTECTOMY WITH INTRAOPERATIVE CHOLANGIOGRAM;  Surgeon: Wilmon Arms. Corliss Skains, MD;  Location: [facility];  Service: General;  Laterality: N/A;   LAPAROSCOPY N/A 02/19/2018   Procedure: LAPAROSCOPY DIAGNOSTIC WITH PERITONEAL BIOPSIES;  Surgeon: Allie Bossier, MD;  Location: [facility];  Service: Gynecology;  Laterality: N/A;   TUBAL LIGATION  09/08/2011   Procedure: POST PARTUM TUBAL LIGATION;  Surgeon: Scheryl Darter, MD;  Location: [facility];  Service: Gynecology;  Laterality: Bilateral;   vaginal deliveries      Family History  Problem Relation Age of Onset   Drug abuse Mother    Drug abuse Father    Diabetes Maternal Grandmother    Hypertension Maternal Grandmother    Diabetes Paternal Grandmother    Diabetes Paternal Grandfather    Breast cancer Paternal Aunt     Social History   Tobacco Use   Smoking status: Former    Current packs/day: 0.00    Average packs/day: 0.5 packs/day for 12.0 years (6.0 ttl pk-yrs)    Types: Cigarettes    Start date: 02/12/2010    Quit date: 02/12/2022    Years since quitting: 2.0    Passive exposure: Past   Smokeless tobacco: Never   Tobacco comments:  vaped for 1 month  Vaping Use   Vaping status: Never Used  Substance Use Topics   Alcohol use: Yes    Comment: socially    Drug use: Not Currently    Frequency: 7.0 times per week    Comment:  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    Appearance: Normal appearance. She is well-developed. She is not ill-appearing, toxic-appearing or diaphoretic.  HENT:     Head: Normocephalic and atraumatic.     Nose: Nose normal.     Mouth/Throat:     Mouth:  Mucous membranes are moist.  Eyes:     General: No scleral icterus.       Right eye: No discharge.        Left eye: No discharge.     Extraocular Movements: Extraocular movements intact.  Cardiovascular:     Rate and Rhythm: Normal rate.  Pulmonary:     Effort: Pulmonary effort is normal.  Skin:    General: Skin is warm and dry.  Neurological:     General: No focal deficit present.     Mental Status: She is alert and oriented to person, place, and time.  Psychiatric:        Mood and Affect: Mood normal.        Behavior: Behavior normal.     Results for orders placed or performed during the hospital encounter of 02/21/24 (from the past 24 hours)  POCT urinalysis dipstick     Status: Abnormal   Collection Time: 02/21/24  7:55 PM  Result Value Ref Range   Color, UA yellow    Clarity, UA clear    Glucose, UA negative mg/dL   Bilirubin, UA negative    Ketones, POC UA negative mg/dL   Spec Grav, UA >=4.098 (A)    Blood, UA negative    pH, UA 6.0    Protein Ur, POC =30 (A) mg/dL   Urobilinogen, UA 1.0 E.U./dL   Nitrite, UA Negative    Leukocytes, UA Small (1+) (A)   POCT urine pregnancy     Status: Normal   Collection Time: 02/21/24  7:58 PM  Result Value Ref Range   Preg Test, Ur Negative     Assessment and Plan :   PDMP not reviewed this encounter.  1. Urinary frequency   2. Dysuria    Start Bactrim to cover for acute cystitis, urine culture pending.  Recommended aggressive hydration, limiting urinary irritants. Counseled patient on potential for adverse effects with medications prescribed/recommended today, ER and return-to-clinic precautions discussed, patient verbalized understanding.    Wallis Bamberg, [place] 02/25/24 [id number]

## 2024-02-24 LAB — URINE CULTURE: Culture: 30000 — AB

## 2024-03-12 ENCOUNTER — Encounter: Payer: Self-pay | Admitting: Student

## 2024-03-12 DIAGNOSIS — F1121 Opioid dependence, in remission: Secondary | ICD-10-CM

## 2024-03-13 MED ORDER — SUBOXONE 8-2 MG SL FILM: ORAL_FILM | SUBLINGUAL | 0 refills | Status: DC

## 2024-04-14 ENCOUNTER — Ambulatory Visit: Admitting: Student

## 2024-04-15 ENCOUNTER — Other Ambulatory Visit: Payer: Self-pay | Admitting: Student

## 2024-04-15 DIAGNOSIS — F1121 Opioid dependence, in remission: Secondary | ICD-10-CM

## 2024-04-16 MED ORDER — SUBOXONE 8-2 MG SL FILM: ORAL_FILM | SUBLINGUAL | 0 refills | Status: DC

## 2024-04-18 ENCOUNTER — Encounter: Payer: Self-pay | Admitting: Student

## 2024-04-18 ENCOUNTER — Ambulatory Visit (INDEPENDENT_AMBULATORY_CARE_PROVIDER_SITE_OTHER): Admitting: Student

## 2024-04-18 VITALS — BP 120/78 | HR 76 | Ht 68.0 in | Wt 168.0 lb

## 2024-04-18 DIAGNOSIS — F1121 Opioid dependence, in remission: Secondary | ICD-10-CM

## 2024-04-18 DIAGNOSIS — E2839 Other primary ovarian failure: Secondary | ICD-10-CM | POA: Diagnosis not present

## 2024-04-18 NOTE — Assessment & Plan Note (Signed)
 Stable on current regime. Medication refilled few days ago. Routine urine drug screen obtained today.

## 2024-04-18 NOTE — Patient Instructions (Addendum)
 Pleasure to see you today.  Today we collected urine sample for routine drug test.  Also your missed period and the heart flashes is most likely premenopausal.  We have obtained labs last year which was consistent with premenopause.  Black Cohosh is a supplement some women have tried to alleviate premenopausal symptoms.

## 2024-04-18 NOTE — Progress Notes (Addendum)
    SUBJECTIVE:   CHIEF COMPLAINT / HPI:   38 year old female with history of chronic opioid dependence presenting today for follow-up.  Has been on chronic Suboxone  stable and compliance with current medication.  Denies any associated adverse effects with the medication.  She is also reports  irregular menstrual cycles and symptoms suggestive of premenopause.She experiences increasingly irregular menstrual cycles, with periods missed for up to two months. Previously diagnosed with early premenopause, she had hot flashes but currently denies sweating. Her grandmother had early menopause, which may be relevant. She experiences mood changes, described by her family as stress-related.  PERTINENT  PMH / PSH: Reviewed   OBJECTIVE:   BP 120/78   Pulse 76   Ht 5\' 8"  (1.727 m)   Wt 168 lb (76.2 kg)   LMP 03/14/2024   SpO2 100%   BMI 25.54 kg/m    Physical Exam General: Alert, well appearing, NAD Cardiovascular: Regular rate and rhythm, well-perfused Respiratory: Normal work of breathing on RA Abdomen: No distension or tenderness Psych: Good judgment, pleasant affect  ASSESSMENT/PLAN:   Premature ovarian insufficiency Irregular cycles and hot flashes likely due to hormonal changes. Family history suggests possible early menopause, also labs obtained in 2013 consistent with premenaupause at the time with low estradiol  (6) and FSH (96.7). - Discussed black cohosh for symptom management, noting limited efficacy and variable response. - Provided written information on black cohosh.   Opiate addiction (HCC) Stable on current regime. Medication refilled few days ago. Routine urine drug screen obtained today.      Goble Last, MD [facility]

## 2024-04-18 NOTE — Assessment & Plan Note (Signed)
 Irregular cycles and hot flashes likely due to hormonal changes. Family history suggests possible early menopause, also labs obtained in 2013 consistent with premenaupause at the time with low estradiol  (6) and FSH (96.7). - Discussed black cohosh for symptom management, noting limited efficacy and variable response. - Provided written information on black cohosh.

## 2024-04-23 LAB — TOXASSURE SELECT 13 (MW), URINE

## 2024-04-30 ENCOUNTER — Emergency Department (HOSPITAL_BASED_OUTPATIENT_CLINIC_OR_DEPARTMENT_OTHER)
Admission: EM | Admit: 2024-04-30 | Discharge: 2024-04-30 | Disposition: A | Discharge status: Home / Self Care | Attending: Emergency Medicine | Admitting: Emergency Medicine

## 2024-04-30 ENCOUNTER — Other Ambulatory Visit: Payer: Self-pay

## 2024-04-30 DIAGNOSIS — N3000 Acute cystitis without hematuria: Secondary | ICD-10-CM | POA: Diagnosis not present

## 2024-04-30 DIAGNOSIS — R300 Dysuria: Secondary | ICD-10-CM | POA: Diagnosis present

## 2024-04-30 LAB — URINALYSIS, ROUTINE W REFLEX MICROSCOPIC
Bilirubin Urine: NEGATIVE
Glucose, UA: NEGATIVE mg/dL
Hgb urine dipstick: NEGATIVE
Ketones, ur: NEGATIVE mg/dL
Nitrite: NEGATIVE
Protein, ur: NEGATIVE mg/dL
Specific Gravity, Urine: >=1.03 (ref 1.005–1.030)
pH: 6.5 (ref 5.0–8.0)

## 2024-04-30 LAB — URINALYSIS, MICROSCOPIC (REFLEX)

## 2024-04-30 LAB — PREGNANCY, URINE: Preg Test, Ur: NEGATIVE

## 2024-04-30 MED ORDER — NITROFURANTOIN MONOHYD MACRO 100 MG PO CAPS: 100.0000 mg | ORAL_CAPSULE | Freq: Two times a day (BID) | ORAL | 0 refills | Status: DC

## 2024-04-30 MED ORDER — FLUCONAZOLE 150 MG PO TABS: 150.0000 mg | ORAL_TABLET | Freq: Every day | ORAL | 0 refills | Status: DC

## 2024-04-30 MED ORDER — ONDANSETRON 4 MG PO TBDP: 4.0000 mg | ORAL_TABLET | Freq: Three times a day (TID) | ORAL | 0 refills | Status: DC | PRN

## 2024-04-30 NOTE — ED Provider Notes (Signed)
 Rhonda Cabrera Provider Note   CSN: [id number] Arrival date & time: 04/30/24  2108     History  Chief Complaint  Patient presents with   Dysuria    Rhonda Cabrera is a 39 y.o. female.  The history is provided by the patient and medical records.  Dysuria Rhonda Cabrera is a 38 y.o. female who presents to the Emergency Department complaining of dysuria.  She presents to the emergency department for 2 to 3 days of urinary frequency, dysuria and odor to her urine.  No associated fever, nausea, vomiting, abdominal pain, back pain, vaginal discharge.  She has experienced Emler episodes in the past secondary to UTI.  She has no known medical problems.      Home Medications Prior to Admission medications   Medication Sig Start Date End Date Taking? Authorizing Provider  fluconazole  (DIFLUCAN ) 150 MG tablet Take 1 tablet (150 mg total) by mouth daily. 04/30/24  Yes Kelsey Patricia, MD  nitrofurantoin , macrocrystal-monohydrate, (MACROBID ) 100 MG capsule Take 1 capsule (100 mg total) by mouth 2 (two) times daily. 04/30/24  Yes Kelsey Patricia, MD  ondansetron  (ZOFRAN -ODT) 4 MG disintegrating tablet Take 1 tablet (4 mg total) by mouth every 8 (eight) hours as needed. 04/30/24  Yes Kelsey Patricia, MD  Biotin 1 MG CAPS Take by mouth.    [provider]  methocarbamol  (ROBAXIN ) 500 MG tablet Take 1 tablet (500 mg total) by mouth 2 (two) times daily. 09/14/23   Goble Last, MD  Multiple Vitamin (MULTIVITAMIN) capsule Take 1 capsule by mouth daily.    [provider]  phenazopyridine  (PYRIDIUM ) 95 MG tablet Take 95 mg by mouth 3 (three) times daily as needed for pain.    [provider]  SUBOXONE  8-2 MG FILM 1 Film (1 each total) under the tongue 2 (two) times daily. DAW1: Please only give brand name, Normal 04/16/24   Goble Last, MD  sulfamethoxazole -trimethoprim  (BACTRIM  DS) 800-160 MG tablet Take 1 tablet  by mouth 2 (two) times daily. 02/21/24   Adolph Hoop, PA-C  vitamin E 180 MG (400 UNITS) capsule Take 400 Units by mouth daily.    [provider]      Allergies    Quetiapine  and Seroquel  xr [quetiapine  fumarate er]    Review of Systems   Review of Systems  Genitourinary:  Positive for dysuria.  All other systems reviewed and are negative.   Physical Exam Updated Vital Signs BP (!) 133/103 (BP Location: Right Arm)   Pulse 61   Temp 98.8 F (37.1 C) (Oral)   Resp 14   Ht 5\' 8"  (1.727 m)   Wt 75.3 kg   LMP 04/14/2024 (Approximate)   SpO2 100%   BMI 25.24 kg/m  Physical Exam Vitals and nursing note reviewed.  Constitutional:      Appearance: She is well-developed.  HENT:     Head: Normocephalic and atraumatic.  Cardiovascular:     Rate and Rhythm: Normal rate and regular rhythm.  Pulmonary:     Effort: Pulmonary effort is normal. No respiratory distress.  Abdominal:     Palpations: Abdomen is soft.     Tenderness: There is no guarding or rebound.     Comments: Mild suprapubic tenderness  Musculoskeletal:        General: No tenderness.  Skin:    General: Skin is warm and dry.  Neurological:     Mental Status: She is alert and oriented to person, place, and  time.  Psychiatric:        Behavior: Behavior normal.     ED Results / Procedures / Treatments   Labs (all labs ordered are listed, but only abnormal results are displayed) Labs Reviewed  URINALYSIS, ROUTINE W REFLEX MICROSCOPIC - Abnormal; Notable for the following components:      Result Value   Leukocytes,Ua TRACE (*)    All other components within normal limits  URINALYSIS, MICROSCOPIC (REFLEX) - Abnormal; Notable for the following components:   Bacteria, UA MANY (*)    All other components within normal limits  URINE CULTURE  PREGNANCY, URINE    EKG None  Radiology No results found.  Procedures Procedures    Medications Ordered in ED Medications - No data to display  ED Course/  Medical Decision Making/ A&P                                 Medical Decision Making Amount and/or Complexity of Data Reviewed Labs: ordered.  Risk Prescription drug management.   Patient here for evaluation of urinary frequency, dysuria.  UA is concerning for UTI in the setting of her symptoms.  No findings or symptoms concerning for pyelonephritis, obstructing stone.  On record review she did have a nonobstructing left ureteral stone in 2020.  She also had a urine culture performed in March of this year that was positive for E. coli, sensitive to Macrobid .  Will start on Macrobid  for her symptoms.  Will send a culture due to recurrent UTI.  Discussed with patient home care for UTI with outpatient follow-up and return precautions.        Final Clinical Impression(s) / ED Diagnoses Final diagnoses:  Acute cystitis without hematuria    Rx / DC Orders ED Discharge Orders          Ordered    nitrofurantoin , macrocrystal-monohydrate, (MACROBID ) 100 MG capsule  2 times daily        04/30/24 2310    fluconazole  (DIFLUCAN ) 150 MG tablet  Daily        04/30/24 2310    ondansetron  (ZOFRAN -ODT) 4 MG disintegrating tablet  Every 8 hours PRN        04/30/24 2310              Kelsey Patricia, MD 04/30/24 2342

## 2024-04-30 NOTE — ED Notes (Signed)
Lab called and made aware of need for urine culture 

## 2024-04-30 NOTE — ED Triage Notes (Signed)
 Pt POV steady gait- c/o having a uti x2 days Denies discharge, fever.

## 2024-05-03 LAB — URINE CULTURE: Culture: >=100000 — AB

## 2024-05-04 ENCOUNTER — Telehealth (HOSPITAL_BASED_OUTPATIENT_CLINIC_OR_DEPARTMENT_OTHER): Payer: Self-pay | Admitting: *Deleted

## 2024-05-04 NOTE — Telephone Encounter (Signed)
 Post ED Visit - Positive Culture Follow-up  Culture report reviewed by antimicrobial stewardship pharmacist: [facility] []  Court Distance, Pharm.D. []  Skeet Duke, Pharm.D., BCPS AQ-ID []  Leslee Rase, Pharm.D., BCPS []  Garland Junk, Pharm.D., BCPS []  [place], [address].D., BCPS, AAHIVP []  Alcide Aly, Pharm.D., BCPS, AAHIVP []  Jerri Morale, PharmD, BCPS []  Graham Laws, PharmD, BCPS []  Cleda Curly, PharmD, BCPS []  Tamar Fairly, PharmD []  Ballard Levels, PharmD, BCPS [x]  Heather  Elvan Hamel, PharmD  [facility] []  Arlyne Bering, PharmD []  Sherryle Don, PharmD []  Van Gelinas, PharmD []  Delila Felty, Rph []  [place]) Cleora Daft, PharmD []  Augustina Block, PharmD []  Arie Kurtz, PharmD []  Sharlyn Deaner, PharmD []  Agnes Hose, PharmD []  Kendall Pauls, PharmD []  Gladstone Lamer, PharmD []  Armanda Bern, PharmD []  Tera Fellows, PharmD   Positive urine culture Treated with Nitrofurantoin , organism sensitive to the same and no further patient follow-up is required at this time.  Georgine Kitchens 05/04/2024, 3:24 PM

## 2024-05-08 ENCOUNTER — Encounter: Payer: Self-pay | Admitting: Student

## 2024-05-08 DIAGNOSIS — F1121 Opioid dependence, in remission: Secondary | ICD-10-CM

## 2024-05-11 MED ORDER — SUBOXONE 8-2 MG SL FILM: ORAL_FILM | SUBLINGUAL | 0 refills | Status: DC

## 2024-05-14 ENCOUNTER — Encounter: Payer: Self-pay | Admitting: Student

## 2024-06-09 ENCOUNTER — Encounter: Payer: Self-pay | Admitting: Student

## 2024-06-09 ENCOUNTER — Other Ambulatory Visit: Payer: Self-pay | Admitting: Student

## 2024-06-09 DIAGNOSIS — F1121 Opioid dependence, in remission: Secondary | ICD-10-CM

## 2024-06-09 MED ORDER — SUBOXONE 8-2 MG SL FILM
ORAL_FILM | SUBLINGUAL | 0 refills | Status: DC
Start: 2024-06-09 — End: 2024-07-07

## 2024-06-09 NOTE — Progress Notes (Signed)
Refilled Suboxone.

## 2024-07-06 ENCOUNTER — Encounter: Payer: Self-pay | Admitting: Student

## 2024-07-06 DIAGNOSIS — F1121 Opioid dependence, in remission: Secondary | ICD-10-CM

## 2024-07-08 MED ORDER — SUBOXONE 8-2 MG SL FILM: ORAL_FILM | SUBLINGUAL | 0 refills | Status: DC

## 2024-07-14 ENCOUNTER — Other Ambulatory Visit: Payer: Self-pay

## 2024-07-14 ENCOUNTER — Emergency Department (HOSPITAL_COMMUNITY)
Admission: EM | Admit: 2024-07-14 | Discharge: 2024-07-14 | Disposition: A | Discharge status: Home / Self Care | Attending: Emergency Medicine | Admitting: Emergency Medicine

## 2024-07-14 ENCOUNTER — Emergency Department (HOSPITAL_COMMUNITY)

## 2024-07-14 ENCOUNTER — Encounter (HOSPITAL_COMMUNITY): Payer: Self-pay

## 2024-07-14 DIAGNOSIS — R102 Pelvic and perineal pain: Secondary | ICD-10-CM | POA: Diagnosis present

## 2024-07-14 LAB — CBC WITH DIFFERENTIAL/PLATELET
Abs Immature Granulocytes: 0.01 K/uL (ref 0.00–0.07)
Basophils Absolute: 0 K/uL (ref 0.0–0.1)
Basophils Relative: 0 %
Eosinophils Absolute: 0.1 K/uL (ref 0.0–0.5)
Eosinophils Relative: 1 %
HCT: 38.7 % (ref 36.0–46.0)
Hemoglobin: 12.2 g/dL (ref 12.0–15.0)
Immature Granulocytes: 0 %
Lymphocytes Relative: 12 %
Lymphs Abs: 0.8 K/uL (ref 0.7–4.0)
MCH: 28.1 pg (ref 26.0–34.0)
MCHC: 31.5 g/dL (ref 30.0–36.0)
MCV: 89.2 fL (ref 80.0–100.0)
Monocytes Absolute: 0.3 K/uL (ref 0.1–1.0)
Monocytes Relative: 5 %
Neutro Abs: 5.5 K/uL (ref 1.7–7.7)
Neutrophils Relative %: 82 %
Platelets: 303 K/uL (ref 150–400)
RBC: 4.34 MIL/uL (ref 3.87–5.11)
RDW: 12.7 % (ref 11.5–15.5)
WBC: 6.7 K/uL (ref 4.0–10.5)
nRBC: 0 % (ref 0.0–0.2)

## 2024-07-14 LAB — URINALYSIS, ROUTINE W REFLEX MICROSCOPIC
Bacteria, UA: NONE SEEN
Bilirubin Urine: NEGATIVE
Glucose, UA: NEGATIVE mg/dL
Ketones, ur: NEGATIVE mg/dL
Nitrite: NEGATIVE
Protein, ur: 100 mg/dL — AB
RBC / HPF: >50 RBC/hpf (ref 0–5)
Specific Gravity, Urine: 1.031 — ABNORMAL HIGH (ref 1.005–1.030)
WBC, UA: >50 WBC/hpf (ref 0–5)
pH: 5 (ref 5.0–8.0)

## 2024-07-14 LAB — COMPREHENSIVE METABOLIC PANEL WITH GFR
ALT: 14 U/L (ref 0–44)
AST: 22 U/L (ref 15–41)
Albumin: 4.2 g/dL (ref 3.5–5.0)
Alkaline Phosphatase: 57 U/L (ref 38–126)
Anion gap: 8 (ref 5–15)
BUN: 12 mg/dL (ref 6–20)
CO2: 25 mmol/L (ref 22–32)
Calcium: 9.3 mg/dL (ref 8.9–10.3)
Chloride: 104 mmol/L (ref 98–111)
Creatinine, Ser: 0.5 mg/dL (ref 0.44–1.00)
GFR, Estimated: >60 mL/min (ref >60)
Glucose, Bld: 122 mg/dL — ABNORMAL HIGH (ref 70–99)
Potassium: 3.9 mmol/L (ref 3.5–5.1)
Sodium: 137 mmol/L (ref 135–145)
Total Bilirubin: 0.3 mg/dL (ref 0.0–1.2)
Total Protein: 7.4 g/dL (ref 6.5–8.1)

## 2024-07-14 LAB — LIPASE, BLOOD: Lipase: 31 U/L (ref 11–51)

## 2024-07-14 LAB — HCG, SERUM, QUALITATIVE: Preg, Serum: NEGATIVE

## 2024-07-14 MED ORDER — IOHEXOL 300 MG/ML  SOLN
100.0000 mL | Freq: Once | INTRAMUSCULAR | Status: AC | PRN
  Administered 2024-07-14: 100 mL via INTRAVENOUS

## 2024-07-14 MED ORDER — KETOROLAC TROMETHAMINE 30 MG/ML IJ SOLN
15.0000 mg | Freq: Once | INTRAMUSCULAR | Status: AC
  Administered 2024-07-14: 15 mg via INTRAVENOUS
  Filled 2024-07-14: qty 1

## 2024-07-14 MED ORDER — ONDANSETRON HCL 4 MG/2ML IJ SOLN
4.0000 mg | Freq: Once | INTRAMUSCULAR | Status: AC
  Administered 2024-07-14: 4 mg via INTRAVENOUS
  Filled 2024-07-14: qty 2

## 2024-07-14 MED ORDER — IBUPROFEN 800 MG PO TABS: 800.0000 mg | ORAL_TABLET | Freq: Three times a day (TID) | ORAL | 0 refills | Status: DC | PRN

## 2024-07-14 NOTE — ED Provider Notes (Signed)
 [place] EMERGENCY DEPARTMENT AT [facility] Provider Note   CSN: [id number] Arrival date & time: 07/14/24  9684     Patient presents with: Abdominal Pain   Rhonda Cabrera is a 38 y.o. female.   Patient with a history of endometriosis presenting with pelvic and abdominal pain that onset tonight about 8 PM.  This feels similar to her previous endometriosis pain and with the start of her menses.  At home she felt like her menses would not come out.  And felt like there was pressure building up in her uterus.  She is now bleeding normally but still having pelvic pain and pressure that feels similar to her menses and endometriosis.  Nausea but no vomiting.  No diarrhea.  No pain with urination or blood in the urine.  No chest pain or shortness of breath.  Does not feel dizzy or lightheaded.  Cannot quantify how many tampons she used at home.  No blood thinner use.  Does take Suboxone  for history of opiate abuse.  The history is provided by the patient.  Abdominal Pain Associated symptoms: nausea and vaginal bleeding   Associated symptoms: no chest pain, no cough, no dysuria, no fever, no hematuria, no shortness of breath and no vomiting        Prior to Admission medications   Medication Sig Start Date End Date Taking? Authorizing Provider  Biotin 1 MG CAPS Take by mouth.    [provider]  fluconazole  (DIFLUCAN ) 150 MG tablet Take 1 tablet (150 mg total) by mouth daily. 04/30/24   Griselda Norris, MD  methocarbamol  (ROBAXIN ) 500 MG tablet Take 1 tablet (500 mg total) by mouth 2 (two) times daily. 09/14/23   Rosendo Rush, MD  Multiple Vitamin (MULTIVITAMIN) capsule Take 1 capsule by mouth daily.    [provider]  nitrofurantoin , macrocrystal-monohydrate, (MACROBID ) 100 MG capsule Take 1 capsule (100 mg total) by mouth 2 (two) times daily. 04/30/24   Griselda Norris, MD  ondansetron  (ZOFRAN -ODT) 4 MG disintegrating tablet Take 1 tablet (4 mg total)  by mouth every 8 (eight) hours as needed. 04/30/24   Griselda Norris, MD  phenazopyridine  (PYRIDIUM ) 95 MG tablet Take 95 mg by mouth 3 (three) times daily as needed for pain.    [provider]  SUBOXONE  8-2 MG FILM 1 Film (1 each total) under the tongue 2 (two) times daily. DAW1: Please only give brand name, Normal 07/08/24   Rosendo Rush, MD  sulfamethoxazole -trimethoprim  (BACTRIM  DS) 800-160 MG tablet Take 1 tablet by mouth 2 (two) times daily. 02/21/24   Christopher Savannah, PA-C  vitamin E 180 MG (400 UNITS) capsule Take 400 Units by mouth daily.    [provider]    Allergies: Quetiapine  and Seroquel  xr [quetiapine  fumarate er]    Review of Systems  Constitutional:  Negative for activity change, appetite change and fever.  HENT:  Negative for congestion and rhinorrhea.   Respiratory:  Negative for cough, chest tightness and shortness of breath.   Cardiovascular:  Negative for chest pain.  Gastrointestinal:  Positive for abdominal pain and nausea. Negative for vomiting.  Genitourinary:  Positive for pelvic pain and vaginal bleeding. Negative for dysuria and hematuria.  Musculoskeletal:  Negative for arthralgias and back pain.  Skin:  Negative for rash.  Neurological:  Negative for dizziness, weakness and headaches.   all other systems are negative except as noted in the HPI and PMH.    Updated Vital Signs BP 129/85 (BP Location: Left Arm)  Pulse 93   Temp 98.3 F (36.8 C) (Oral)   Resp 20   SpO2 100%   Physical Exam Vitals and nursing note reviewed.  Constitutional:      General: She is not in acute distress.    Appearance: She is well-developed.  HENT:     Head: Normocephalic and atraumatic.     Mouth/Throat:     Pharynx: No oropharyngeal exudate.  Eyes:     Conjunctiva/sclera: Conjunctivae normal.     Pupils: Pupils are equal, round, and reactive to light.  Neck:     Comments: No meningismus. Cardiovascular:     Rate and Rhythm: Normal rate and regular  rhythm.     Heart sounds: Normal heart sounds. No murmur heard. Pulmonary:     Effort: Pulmonary effort is normal. No respiratory distress.     Breath sounds: Normal breath sounds.  Abdominal:     Palpations: Abdomen is soft.     Tenderness: There is abdominal tenderness. There is no guarding or rebound.     Comments: Suprapubic tenderness, no CVA tender  Musculoskeletal:        General: No tenderness. Normal range of motion.     Cervical back: Normal range of motion and neck supple.  Skin:    General: Skin is warm.  Neurological:     Mental Status: She is alert and oriented to person, place, and time.     Cranial Nerves: No cranial nerve deficit.     Motor: No abnormal muscle tone.     Coordination: Coordination normal.     Comments:  5/5 strength throughout. CN 2-12 intact.Equal grip strength.   Psychiatric:        Behavior: Behavior normal.     (all labs ordered are listed, but only abnormal results are displayed) Labs Reviewed  COMPREHENSIVE METABOLIC PANEL WITH GFR - Abnormal; Notable for the following components:      Result Value   Glucose, Bld 122 (*)    All other components within normal limits  URINALYSIS, ROUTINE W REFLEX MICROSCOPIC - Abnormal; Notable for the following components:   Color, Urine AMBER (*)    APPearance CLOUDY (*)    Specific Gravity, Urine 1.031 (*)    Hgb urine dipstick LARGE (*)    Protein, ur 100 (*)    Leukocytes,Ua TRACE (*)    All other components within normal limits  LIPASE, BLOOD  HCG, SERUM, QUALITATIVE  CBC WITH DIFFERENTIAL/PLATELET    EKG: None  Radiology: CT ABDOMEN PELVIS W CONTRAST Result Date: 07/14/2024 EXAM: CT ABDOMEN AND PELVIS WITH CONTRAST 07/14/2024 05:33:38 AM TECHNIQUE: CT of the abdomen and pelvis was performed with the administration of intravenous contrast. Multiplanar reformatted images are provided for review. Automated exposure control, iterative reconstruction, and/or weight based adjustment of the mA/kV  was utilized to reduce the radiation dose to as low as reasonably achievable. COMPARISON: CT of the abdomen and pelvis 02/01/2019. Pelvic ultrasound 10/16/2022. CLINICAL HISTORY: Abdominal pain, acute, nonlocalized. FINDINGS: LOWER CHEST: No acute abnormality. LIVER: The liver is unremarkable. GALLBLADDER AND BILE DUCTS: Cholecystectomy. No biliary ductal dilatation. SPLEEN: No acute abnormality. PANCREAS: No acute abnormality. ADRENAL GLANDS: No acute abnormality. KIDNEYS, URETERS AND BLADDER: 6 mm nonobstructing stone is present in the interpolar region of the left kidney, not significantly changed from the prior CT scan. No other significant nephrolithiasis is present. No obstruction is present. No hydronephrosis. No perinephric or periureteral stranding. Urinary bladder is unremarkable. GI AND BOWEL: Stomach demonstrates no acute abnormality. There is  no bowel obstruction. No bowel wall thickening. PERITONEUM AND RETROPERITONEUM: Minimal free fluid within the anatomic pelvis is likely physiologic. No free air. VASCULATURE: Aorta is normal in caliber. LYMPH NODES: No lymphadenopathy. REPRODUCTIVE ORGANS: No acute abnormality. BONES AND SOFT TISSUES: No acute osseous abnormality. No focal soft tissue abnormality. IMPRESSION: 1. No acute findings. 2. Status post cholecystectomy. 3. 6 mm nonobstructing stone in the interpolar region of the left kidney, not significantly changed from the prior CT scan. No other significant nephrolithiasis or obstruction. Electronically signed by: Lonni Necessary MD 07/14/2024 05:41 AM EDT RP Workstation: [id number]     Procedures   Medications Ordered in the ED  ketorolac  (TORADOL ) 30 MG/ML injection 15 mg (has no administration in time range)  ondansetron  (ZOFRAN ) injection 4 mg (has no administration in time range)                                    Medical Decision Making Amount and/or Complexity of Data Reviewed Labs: ordered. Decision-making details  documented in ED Course. Radiology: ordered and independent interpretation performed. Decision-making details documented in ED Course. ECG/medicine tests: ordered and independent interpretation performed. Decision-making details documented in ED Course.  Risk Prescription drug management.   History of endometriosis here with pelvic pain.  Vitals are stable.  No distress.  Abdomen soft but tender suprapubically. No peritoneal signs.  Will treat symptoms with nonnarcotic medications.  Ensure stable hemoglobin.  Ensure hCG is negative.  Pelvic ultrasound in 2023 was unrevealing.  Low concern for ovarian torsion.  Patient's pain is improved with Toradol .  She is not pregnant.  Her hemoglobin is stable.  Low suspicion for ovarian torsion, appendicitis, bowel obstruction. CT scan is stable with kidney stone in the left kidney without any other acute pathology.  She feels improved on recheck.  She is tolerating p.o.  Her vitals are stable.  Suspect her symptoms likely secondary to her endometriosis as well as menses. She declines pelvic exam.  Follow-up with her PCP as well as gynecologist.  Will give short course of NSAIDs.  Return precautions discussed.     Final diagnoses:  Pelvic pain    ED Discharge Orders     None          Janyth Riera, Garnette, MD 07/14/24 [phone]

## 2024-07-14 NOTE — Discharge Instructions (Signed)
 Your testing is reassuring.  You declined pelvic exam today.  Take the anti-inflammatories as prescribed and follow-up with your primary doctor as well as gynecologist.  Return to the ED with new or worsening symptoms.

## 2024-07-14 NOTE — ED Triage Notes (Signed)
 Pt. Arrives for abdominal pain that started tonight. States that she feels like her period is trying to stop but the blood won't come out. Reports nausea. Denies vomiting and diarrhea.

## 2024-07-17 ENCOUNTER — Encounter: Payer: Self-pay | Admitting: Student

## 2024-07-17 ENCOUNTER — Other Ambulatory Visit: Payer: Self-pay | Admitting: Student

## 2024-07-17 MED ORDER — IBUPROFEN 800 MG PO TABS: 800.0000 mg | ORAL_TABLET | Freq: Three times a day (TID) | ORAL | 0 refills | Status: DC | PRN

## 2024-08-04 ENCOUNTER — Encounter: Payer: Self-pay | Admitting: Student

## 2024-08-04 ENCOUNTER — Ambulatory Visit (HOSPITAL_COMMUNITY)
Admission: EM | Admit: 2024-08-04 | Discharge: 2024-08-04 | Disposition: A | Discharge status: Home / Self Care | Attending: Emergency Medicine | Admitting: Emergency Medicine

## 2024-08-04 ENCOUNTER — Encounter (HOSPITAL_COMMUNITY): Payer: Self-pay

## 2024-08-04 DIAGNOSIS — M5442 Lumbago with sciatica, left side: Secondary | ICD-10-CM

## 2024-08-04 MED ORDER — KETOROLAC TROMETHAMINE 30 MG/ML IJ SOLN
30.0000 mg | Freq: Once | INTRAMUSCULAR | Status: AC
  Administered 2024-08-04: 30 mg via INTRAMUSCULAR

## 2024-08-04 MED ORDER — KETOROLAC TROMETHAMINE 30 MG/ML IJ SOLN
INTRAMUSCULAR | Status: AC
  Filled 2024-08-04: qty 1

## 2024-08-04 MED ORDER — CYCLOBENZAPRINE HCL 10 MG PO TABS: 10.0000 mg | ORAL_TABLET | Freq: Two times a day (BID) | ORAL | 0 refills | Status: AC | PRN
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
## 2024-08-04 NOTE — Discharge Instructions (Addendum)
 The Toradol  injection given today should start to work in about 30 minutes. Please do not use any NSAIDs (ibuprofen /Advil , naproxen /Aleve , etc) for the next 12 hours. You can safely use tylenol .   You can take the muscle relaxer Flexeril  twice daily. If the medication makes you drowsy, take only at bed time.  Please keep in mind that you may feel worse before you get better.  Allow at least a full week for majority of improvement. Please go to the emergency department if symptoms worsen or become severe.
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
## 2024-08-12 ENCOUNTER — Encounter: Payer: Self-pay | Admitting: Student

## 2024-08-12 ENCOUNTER — Ambulatory Visit (INDEPENDENT_AMBULATORY_CARE_PROVIDER_SITE_OTHER): Admitting: Student

## 2024-08-12 DIAGNOSIS — F1121 Opioid dependence, in remission: Secondary | ICD-10-CM | POA: Diagnosis not present

## 2024-08-12 MED ORDER — SUBOXONE 8-2 MG SL FILM: ORAL_FILM | SUBLINGUAL | 0 refills | Status: DC

## 2024-08-12 NOTE — Assessment & Plan Note (Signed)
 Opioid use disorder well-managed with Suboxone . She expressed desire to discontinue but is apprehensive due to past unsuccessful attempts. - Refill Suboxone  prescription. - Plan drug testing at year-end.
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
## 2024-08-21 ENCOUNTER — Telehealth: Payer: Self-pay | Admitting: Student

## 2024-08-21 NOTE — Telephone Encounter (Signed)
 Called to let patient know her medical records are ready to be picked up. They have been placed at the front desk.

## 2024-09-02 ENCOUNTER — Telehealth: Payer: Self-pay

## 2024-09-02 NOTE — Telephone Encounter (Signed)
 Patient calls nurse line requesting to speak with PCP.   She reports she started her period yesterday and reports very painful cramps. She reports she took Robaxin  500mg  last night and another 500mg  this morning. She reports she also took (1) 220mg  Aleve  ~30 minutes ago. She reports no relief.   She reports she would like PCP opinion on taking an additional 1/2 Robaxin .   She reports she does not have any OTC medications at home. She reports she randomly found the Aleve  packet.   She reports she dicussed increasing Robaxin  dosage with PCP at previous visit.  Advised will forward to PCP.

## 2024-09-04 ENCOUNTER — Other Ambulatory Visit: Payer: Self-pay | Admitting: Student

## 2024-09-04 MED ORDER — METHOCARBAMOL 500 MG PO TABS: 500.0000 mg | ORAL_TABLET | Freq: Four times a day (QID) | ORAL | 0 refills | Status: AC

## 2024-09-04 NOTE — Progress Notes (Signed)
 Rx robaxin  which patient said has helped in the past for menstrual Cramps

## 2024-09-05 ENCOUNTER — Other Ambulatory Visit: Payer: Self-pay | Admitting: Student

## 2024-09-10 ENCOUNTER — Encounter: Payer: Self-pay | Admitting: Family Medicine

## 2024-09-19 ENCOUNTER — Encounter: Payer: Self-pay | Admitting: Student

## 2024-09-19 DIAGNOSIS — F1121 Opioid dependence, in remission: Secondary | ICD-10-CM

## 2024-09-22 MED ORDER — SUBOXONE 8-2 MG SL FILM: ORAL_FILM | SUBLINGUAL | 0 refills | Status: DC

## 2024-09-25 ENCOUNTER — Emergency Department (HOSPITAL_COMMUNITY)
Admission: EM | Admit: 2024-09-25 | Discharge: 2024-09-26 | Discharge status: Against Medical Advice | Attending: Emergency Medicine | Admitting: Emergency Medicine

## 2024-09-25 ENCOUNTER — Emergency Department (HOSPITAL_COMMUNITY)

## 2024-09-25 ENCOUNTER — Other Ambulatory Visit: Payer: Self-pay

## 2024-09-25 DIAGNOSIS — Z5321 Procedure and treatment not carried out due to patient leaving prior to being seen by health care provider: Secondary | ICD-10-CM | POA: Diagnosis not present

## 2024-09-25 DIAGNOSIS — R0789 Other chest pain: Secondary | ICD-10-CM | POA: Insufficient documentation

## 2024-09-25 LAB — CBC
HCT: 38.4 % (ref 36.0–46.0)
Hemoglobin: 12.1 g/dL (ref 12.0–15.0)
MCH: 28 pg (ref 26.0–34.0)
MCHC: 31.5 g/dL (ref 30.0–36.0)
MCV: 88.9 fL (ref 80.0–100.0)
Platelets: 287 K/uL (ref 150–400)
RBC: 4.32 MIL/uL (ref 3.87–5.11)
RDW: 12.4 % (ref 11.5–15.5)
WBC: 5.1 K/uL (ref 4.0–10.5)
nRBC: 0 % (ref 0.0–0.2)

## 2024-09-25 LAB — BASIC METABOLIC PANEL WITH GFR
Anion gap: 10 (ref 5–15)
BUN: 9 mg/dL (ref 6–20)
CO2: 27 mmol/L (ref 22–32)
Calcium: 9.4 mg/dL (ref 8.9–10.3)
Chloride: 102 mmol/L (ref 98–111)
Creatinine, Ser: 0.63 mg/dL (ref 0.44–1.00)
GFR, Estimated: >60 mL/min (ref >60)
Glucose, Bld: 90 mg/dL (ref 70–99)
Potassium: 3.9 mmol/L (ref 3.5–5.1)
Sodium: 139 mmol/L (ref 135–145)

## 2024-09-25 LAB — TROPONIN I (HIGH SENSITIVITY): Troponin I (High Sensitivity): <2 ng/L (ref <18)

## 2024-09-25 NOTE — ED Triage Notes (Signed)
 Patient reports intermittent left upper chest tightness/discomfort this morning , denies SOB , no cough or fever , no emesis or diaphoresis .

## 2024-09-26 DIAGNOSIS — R0789 Other chest pain: Secondary | ICD-10-CM | POA: Diagnosis not present

## 2024-09-26 LAB — TROPONIN I (HIGH SENSITIVITY): Troponin I (High Sensitivity): <2 ng/L (ref <18)

## 2024-09-26 LAB — HCG, QUANTITATIVE, PREGNANCY: hCG, Beta Chain, Quant, S: 4 m[IU]/mL (ref <5)

## 2024-09-26 NOTE — ED Notes (Signed)
 Pt informed she was leaving.

## 2024-10-28 ENCOUNTER — Encounter: Payer: Self-pay | Admitting: Student

## 2024-10-28 DIAGNOSIS — F1121 Opioid dependence, in remission: Secondary | ICD-10-CM

## 2024-10-28 MED ORDER — SUBOXONE 8-2 MG SL FILM: ORAL_FILM | SUBLINGUAL | 0 refills | Status: DC

## 2024-12-02 ENCOUNTER — Encounter: Payer: Self-pay | Admitting: Family Medicine

## 2024-12-02 DIAGNOSIS — F1121 Opioid dependence, in remission: Secondary | ICD-10-CM

## 2024-12-03 MED ORDER — SUBOXONE 8-2 MG SL FILM: ORAL_FILM | SUBLINGUAL | 0 refills | Status: DC

## 2024-12-04 ENCOUNTER — Ambulatory Visit: Admitting: Family Medicine

## 2024-12-04 VITALS — BP 119/75 | HR 97 | Ht 68.0 in | Wt 173.8 lb

## 2024-12-04 DIAGNOSIS — R5383 Other fatigue: Secondary | ICD-10-CM

## 2024-12-04 DIAGNOSIS — E2839 Other primary ovarian failure: Secondary | ICD-10-CM | POA: Diagnosis not present

## 2024-12-04 DIAGNOSIS — N951 Menopausal and female climacteric states: Secondary | ICD-10-CM | POA: Diagnosis not present

## 2024-12-04 MED ORDER — NORETHIN ACE-ETH ESTRAD-FE 1-20 MG-MCG(24) PO TABS: 1.0000 | ORAL_TABLET | Freq: Every day | ORAL | 11 refills | Status: DC

## 2024-12-04 NOTE — Progress Notes (Cosign Needed)
° ° °  SUBJECTIVE:   CHIEF COMPLAINT / HPI:   ? Menopause She has been having symptoms of night sweats, hot flashes, forgetfulness/brain fog, up and down moods, and fatigue for months now. Also has vaginal dryness, low libido; this is affecting her confidence. Was taking black kohash which helped but now she has stopped and her symptoms are worse. Her last menstrual cycle was 2 months ago.  She also shares family history of thyroid  dysfunction and is concerned about this.  She takes daily vitamin, fish oil supplements.  PERTINENT  PMH / PSH: Reviewed. Migraine, asthma, smoking history.  OBJECTIVE:   BP 119/75   Pulse 97   Ht 5' 8 (1.727 m)   Wt 173 lb 12.8 oz (78.8 kg)   SpO2 100%   BMI 26.43 kg/m   General: well-appearing, pleasant, no acute distress. HEENT: normocephalic, PERRLA, EOM grossly intact, MMM. No thyromegaly. Cardio: Regular rate, regular rhythm, no murmurs on exam. Pulm: Clear, no wheezing, no crackles. No increased work of breathing. Extremities: Moves all extremities equally. Neuro: Alert and oriented x3, speech normal in content, no facial asymmetry. Psych:  Cognition and judgment appear intact. Alert, communicative, and cooperative.   ASSESSMENT/PLAN:   Assessment & Plan Perimenopausal vasomotor symptoms Premature ovarian failure Reasonable to begin medication management for symptoms improvement. - Loestrin OCP sent to pharmacy - may also continue supplements including black kohosh as desired. - labs today to include TSH, FSH/LH, Vit B12 and D - follow up with PCP in 1 month to reassess   Lauraine Norse, DO [facility]

## 2024-12-04 NOTE — Patient Instructions (Addendum)
 Start taking Loestrin every day to help with hot flashes and other symptoms.  We checked some labs today - I will let you know the results when they are available.  Please follow up in 1 month for reevaluation.

## 2024-12-05 ENCOUNTER — Ambulatory Visit: Payer: Self-pay | Admitting: Family Medicine

## 2024-12-05 LAB — FSH/LH
FSH: 74 m[IU]/mL
LH: 51.4 m[IU]/mL

## 2024-12-05 LAB — VITAMIN B12: Vitamin B-12: 1411 pg/mL — ABNORMAL HIGH (ref 232–1245)

## 2024-12-05 LAB — TSH RFX ON ABNORMAL TO FREE T4: TSH: 1.05 u[IU]/mL (ref 0.450–4.500)

## 2024-12-05 LAB — VITAMIN D 25 HYDROXY (VIT D DEFICIENCY, FRACTURES): Vit D, 25-Hydroxy: 33.6 ng/mL (ref 30.0–100.0)

## 2024-12-06 ENCOUNTER — Encounter: Payer: Self-pay | Admitting: Family Medicine

## 2024-12-08 ENCOUNTER — Encounter

## 2024-12-09 MED ORDER — CLIMARA PRO 0.045-0.015 MG/DAY TD PTWK: 1.0000 | MEDICATED_PATCH | TRANSDERMAL | 0 refills | Status: DC

## 2024-12-09 NOTE — Addendum Note (Signed)
 Addended by: THARON ELNA NOVAK on: 12/09/2024 08:38 AM   Modules accepted: Orders

## 2024-12-22 NOTE — Addendum Note (Signed)
 Addended by: THARON LUNG B on: 12/22/2024 01:55 PM   Modules accepted: Orders

## 2024-12-22 NOTE — Addendum Note (Signed)
 Addended by: THARON LUNG B on: 12/22/2024 04:08 PM   Modules accepted: Orders

## 2024-12-25 NOTE — Addendum Note (Signed)
 Addended by: THARON LUNG B on: 12/25/2024 08:39 AM   Modules accepted: Orders

## 2025-01-01 ENCOUNTER — Ambulatory Visit: Admitting: Family Medicine

## 2025-01-01 ENCOUNTER — Ambulatory Visit: Payer: Self-pay

## 2025-01-01 NOTE — Progress Notes (Unsigned)
" ° ° °  SUBJECTIVE:   CHIEF COMPLAINT / HPI:   Perimenopausal symptoms Started on Loestrin 12/04/2024.  BTL for contraception.  History of migraines and former smoker, requesting transdermal estrogen with oral progesterone .  Symptoms include night sweats, hot flashes, brain fog and mood lability times months.  PERTINENT  PMH / PSH: ***  OBJECTIVE:   There were no vitals taken for this visit. ***  General: NAD, pleasant, able to participate in exam Cardiac: RRR, no murmurs. Respiratory: CTAB, normal effort, No wheezes, rales or rhonchi Abdomen: Bowel sounds present, nontender, nondistended Extremities: no edema or cyanosis. Skin: warm and dry, no rashes noted Neuro: alert, no obvious focal deficits Psych: Normal affect and mood  ASSESSMENT/PLAN:   No problem-specific Assessment & Plan notes found for this encounter.     Dr. Izetta Nap, DO [facility]    {    This will disappear when note is signed, click to select method of visit    :1} "

## 2025-01-06 ENCOUNTER — Ambulatory Visit: Payer: Self-pay | Admitting: Family Medicine

## 2025-01-16 ENCOUNTER — Encounter: Payer: Self-pay | Admitting: Family Medicine

## 2025-01-16 DIAGNOSIS — F1121 Opioid dependence, in remission: Secondary | ICD-10-CM

## 2025-01-16 MED ORDER — SUBOXONE 8-2 MG SL FILM: ORAL_FILM | SUBLINGUAL | 0 refills | Status: AC

## 2025-02-12 ENCOUNTER — Encounter
